# Patient Record
Sex: Female | Born: 1945 | Race: Black or African American | Hispanic: No | Smoking: Former smoker
Health system: Southern US, Community
[De-identification: ages and names within clinical notes are randomized; demographics above are authoritative.]

## PROBLEM LIST (undated history)

## (undated) DIAGNOSIS — D492 Neoplasm of unspecified behavior of bone, soft tissue, and skin: Secondary | ICD-10-CM

## (undated) DIAGNOSIS — N183 Chronic kidney disease, stage 3 (moderate): Secondary | ICD-10-CM

## (undated) DIAGNOSIS — G4733 Obstructive sleep apnea (adult) (pediatric): Secondary | ICD-10-CM

## (undated) DIAGNOSIS — I77819 Aortic ectasia, unspecified site: Secondary | ICD-10-CM

## (undated) DIAGNOSIS — G709 Myoneural disorder, unspecified: Secondary | ICD-10-CM

## (undated) DIAGNOSIS — E785 Hyperlipidemia, unspecified: Secondary | ICD-10-CM

## (undated) DIAGNOSIS — I5032 Chronic diastolic (congestive) heart failure: Secondary | ICD-10-CM

## (undated) DIAGNOSIS — I2699 Other pulmonary embolism without acute cor pulmonale: Secondary | ICD-10-CM

## (undated) DIAGNOSIS — K219 Gastro-esophageal reflux disease without esophagitis: Secondary | ICD-10-CM

## (undated) DIAGNOSIS — N182 Chronic kidney disease, stage 2 (mild): Secondary | ICD-10-CM

## (undated) DIAGNOSIS — I10 Essential (primary) hypertension: Secondary | ICD-10-CM

## (undated) DIAGNOSIS — I749 Embolism and thrombosis of unspecified artery: Secondary | ICD-10-CM

## (undated) HISTORY — DX: Other pulmonary embolism without acute cor pulmonale: I26.99

## (undated) HISTORY — DX: Chronic kidney disease, stage 2 (mild): N18.2

## (undated) HISTORY — DX: Chronic diastolic (congestive) heart failure: I50.32

## (undated) HISTORY — PX: CERVICAL FUSION: SHX112

## (undated) HISTORY — DX: Chronic kidney disease, stage 3 (moderate): N18.3

## (undated) HISTORY — DX: Obstructive sleep apnea (adult) (pediatric): G47.33

## (undated) HISTORY — DX: Aortic ectasia, unspecified site: I77.819

## (undated) HISTORY — DX: Embolism and thrombosis of unspecified artery: I74.9

## (undated) HISTORY — DX: Hyperlipidemia, unspecified: E78.5

---

## 2001-12-09 ENCOUNTER — Ambulatory Visit (HOSPITAL_COMMUNITY): Admission: RE | Admit: 2001-12-09 | Discharge: 2001-12-09 | Payer: Self-pay | Admitting: Family Medicine

## 2001-12-09 ENCOUNTER — Encounter: Payer: Self-pay | Admitting: Family Medicine

## 2001-12-31 ENCOUNTER — Ambulatory Visit: Admission: RE | Admit: 2001-12-31 | Discharge: 2001-12-31 | Payer: Self-pay | Admitting: Family Medicine

## 2002-01-29 ENCOUNTER — Encounter: Payer: Self-pay | Admitting: Family Medicine

## 2002-01-29 ENCOUNTER — Encounter: Admission: RE | Admit: 2002-01-29 | Discharge: 2002-01-29 | Payer: Self-pay | Admitting: Family Medicine

## 2002-02-17 ENCOUNTER — Ambulatory Visit (HOSPITAL_COMMUNITY): Admission: RE | Admit: 2002-02-17 | Discharge: 2002-02-17 | Payer: Self-pay | Admitting: Cardiology

## 2002-06-30 ENCOUNTER — Ambulatory Visit (HOSPITAL_COMMUNITY): Admission: RE | Admit: 2002-06-30 | Discharge: 2002-06-30 | Payer: Self-pay | Admitting: Family Medicine

## 2002-06-30 ENCOUNTER — Encounter: Payer: Self-pay | Admitting: Family Medicine

## 2002-07-02 DIAGNOSIS — I749 Embolism and thrombosis of unspecified artery: Secondary | ICD-10-CM

## 2002-07-02 HISTORY — DX: Embolism and thrombosis of unspecified artery: I74.9

## 2002-12-06 ENCOUNTER — Inpatient Hospital Stay (HOSPITAL_COMMUNITY): Admission: EM | Admit: 2002-12-06 | Discharge: 2002-12-11 | Payer: Self-pay | Admitting: Internal Medicine

## 2002-12-06 ENCOUNTER — Encounter: Payer: Self-pay | Admitting: Internal Medicine

## 2002-12-07 ENCOUNTER — Encounter: Payer: Self-pay | Admitting: General Surgery

## 2003-02-02 ENCOUNTER — Encounter: Payer: Self-pay | Admitting: Neurology

## 2003-02-02 ENCOUNTER — Ambulatory Visit (HOSPITAL_COMMUNITY): Admission: RE | Admit: 2003-02-02 | Discharge: 2003-02-02 | Payer: Self-pay | Admitting: Neurology

## 2003-05-19 ENCOUNTER — Ambulatory Visit (HOSPITAL_COMMUNITY): Admission: RE | Admit: 2003-05-19 | Discharge: 2003-05-19 | Payer: Self-pay | Admitting: Family Medicine

## 2003-05-21 ENCOUNTER — Ambulatory Visit (HOSPITAL_COMMUNITY): Admission: RE | Admit: 2003-05-21 | Discharge: 2003-05-21 | Payer: Self-pay | Admitting: Family Medicine

## 2003-06-17 ENCOUNTER — Other Ambulatory Visit: Admission: RE | Admit: 2003-06-17 | Discharge: 2003-06-17 | Payer: Self-pay | Admitting: Obstetrics and Gynecology

## 2003-08-02 ENCOUNTER — Emergency Department (HOSPITAL_COMMUNITY): Admission: EM | Admit: 2003-08-02 | Discharge: 2003-08-02 | Payer: Self-pay | Admitting: Internal Medicine

## 2003-12-07 ENCOUNTER — Ambulatory Visit (HOSPITAL_COMMUNITY): Admission: RE | Admit: 2003-12-07 | Discharge: 2003-12-07 | Payer: Self-pay | Admitting: Family Medicine

## 2004-04-04 ENCOUNTER — Ambulatory Visit: Payer: Self-pay | Admitting: Cardiology

## 2004-04-19 ENCOUNTER — Ambulatory Visit (HOSPITAL_COMMUNITY): Admission: RE | Admit: 2004-04-19 | Discharge: 2004-04-19 | Payer: Self-pay | Admitting: Family Medicine

## 2004-04-21 ENCOUNTER — Ambulatory Visit: Payer: Self-pay | Admitting: Cardiology

## 2004-07-11 ENCOUNTER — Ambulatory Visit: Payer: Self-pay | Admitting: *Deleted

## 2004-07-18 ENCOUNTER — Ambulatory Visit: Payer: Self-pay | Admitting: Family Medicine

## 2004-07-18 ENCOUNTER — Ambulatory Visit: Payer: Self-pay | Admitting: *Deleted

## 2004-07-25 ENCOUNTER — Ambulatory Visit: Payer: Self-pay | Admitting: *Deleted

## 2004-08-01 ENCOUNTER — Ambulatory Visit: Payer: Self-pay | Admitting: *Deleted

## 2004-10-03 ENCOUNTER — Ambulatory Visit: Payer: Self-pay | Admitting: *Deleted

## 2004-10-11 ENCOUNTER — Ambulatory Visit: Payer: Self-pay | Admitting: Family Medicine

## 2004-10-27 ENCOUNTER — Ambulatory Visit (HOSPITAL_COMMUNITY): Admission: RE | Admit: 2004-10-27 | Discharge: 2004-10-27 | Payer: Self-pay | Admitting: Family Medicine

## 2005-01-03 ENCOUNTER — Ambulatory Visit: Payer: Self-pay | Admitting: Family Medicine

## 2005-01-03 ENCOUNTER — Ambulatory Visit: Payer: Self-pay | Admitting: Cardiology

## 2005-03-07 ENCOUNTER — Ambulatory Visit: Payer: Self-pay | Admitting: *Deleted

## 2005-03-07 ENCOUNTER — Ambulatory Visit: Payer: Self-pay | Admitting: Family Medicine

## 2005-04-03 ENCOUNTER — Ambulatory Visit: Payer: Self-pay | Admitting: Cardiology

## 2005-05-15 ENCOUNTER — Ambulatory Visit: Payer: Self-pay | Admitting: *Deleted

## 2005-06-15 ENCOUNTER — Ambulatory Visit: Payer: Self-pay | Admitting: *Deleted

## 2005-07-26 ENCOUNTER — Ambulatory Visit: Payer: Self-pay | Admitting: Cardiology

## 2005-09-06 ENCOUNTER — Ambulatory Visit: Payer: Self-pay | Admitting: Family Medicine

## 2005-09-06 ENCOUNTER — Ambulatory Visit: Payer: Self-pay | Admitting: Cardiology

## 2005-09-13 ENCOUNTER — Ambulatory Visit (HOSPITAL_COMMUNITY): Admission: RE | Admit: 2005-09-13 | Discharge: 2005-09-13 | Payer: Self-pay | Admitting: Family Medicine

## 2005-10-24 ENCOUNTER — Ambulatory Visit: Payer: Self-pay | Admitting: *Deleted

## 2005-11-06 ENCOUNTER — Ambulatory Visit: Payer: Self-pay | Admitting: Cardiology

## 2005-11-06 ENCOUNTER — Ambulatory Visit: Payer: Self-pay | Admitting: Family Medicine

## 2005-12-24 ENCOUNTER — Ambulatory Visit: Payer: Self-pay | Admitting: *Deleted

## 2006-03-01 ENCOUNTER — Ambulatory Visit: Payer: Self-pay | Admitting: *Deleted

## 2006-04-30 ENCOUNTER — Ambulatory Visit: Payer: Self-pay | Admitting: Family Medicine

## 2006-05-07 ENCOUNTER — Ambulatory Visit (HOSPITAL_COMMUNITY): Admission: RE | Admit: 2006-05-07 | Discharge: 2006-05-07 | Payer: Self-pay | Admitting: Family Medicine

## 2006-05-30 ENCOUNTER — Ambulatory Visit: Payer: Self-pay | Admitting: Cardiology

## 2006-07-23 ENCOUNTER — Ambulatory Visit: Payer: Self-pay | Admitting: Cardiovascular Disease

## 2006-09-04 ENCOUNTER — Ambulatory Visit: Payer: Self-pay | Admitting: Family Medicine

## 2006-10-02 ENCOUNTER — Ambulatory Visit: Payer: Self-pay | Admitting: *Deleted

## 2006-10-16 ENCOUNTER — Ambulatory Visit: Payer: Self-pay | Admitting: Cardiology

## 2006-11-20 ENCOUNTER — Ambulatory Visit: Payer: Self-pay | Admitting: Cardiovascular Disease

## 2006-12-09 ENCOUNTER — Emergency Department (HOSPITAL_COMMUNITY): Admission: EM | Admit: 2006-12-09 | Discharge: 2006-12-09 | Payer: Self-pay | Admitting: Emergency Medicine

## 2006-12-25 ENCOUNTER — Ambulatory Visit: Payer: Self-pay | Admitting: Cardiovascular Disease

## 2007-02-07 ENCOUNTER — Ambulatory Visit: Payer: Self-pay | Admitting: Internal Medicine

## 2007-03-12 ENCOUNTER — Ambulatory Visit: Payer: Self-pay | Admitting: Family Medicine

## 2007-03-12 ENCOUNTER — Ambulatory Visit: Payer: Self-pay | Admitting: Cardiovascular Disease

## 2007-04-10 ENCOUNTER — Ambulatory Visit: Payer: Self-pay | Admitting: Family Medicine

## 2007-04-10 LAB — CONVERTED CEMR LAB
Amylase: 64 units/L (ref 0–105)
BUN: 14 mg/dL (ref 6–23)
Cholesterol: 152 mg/dL (ref 0–200)
Creatinine, Ser: 0.86 mg/dL (ref 0.40–1.20)
Eosinophils Relative: 3 % (ref 0–5)
Hemoglobin: 12.9 g/dL (ref 12.0–15.0)
Lipase: 30 units/L (ref 0–75)
Lymphs Abs: 2.5 10*3/uL (ref 0.7–3.3)
Monocytes Absolute: 0.6 10*3/uL (ref 0.2–0.7)
Neutrophils Relative %: 43 % (ref 43–77)
Platelets: 313 10*3/uL (ref 150–400)
Potassium: 3.9 meq/L (ref 3.5–5.3)
Total CHOL/HDL Ratio: 4.1
Triglycerides: 101 mg/dL (ref <150)
VLDL: 20 mg/dL (ref 0–40)

## 2007-04-21 ENCOUNTER — Ambulatory Visit (HOSPITAL_COMMUNITY): Admission: RE | Admit: 2007-04-21 | Discharge: 2007-04-21 | Payer: Self-pay | Admitting: Family Medicine

## 2007-06-09 ENCOUNTER — Ambulatory Visit: Payer: Self-pay | Admitting: Family Medicine

## 2007-06-28 ENCOUNTER — Emergency Department (HOSPITAL_COMMUNITY): Admission: EM | Admit: 2007-06-28 | Discharge: 2007-06-28 | Payer: Self-pay | Admitting: Emergency Medicine

## 2007-07-11 ENCOUNTER — Ambulatory Visit: Payer: Self-pay | Admitting: Cardiology

## 2007-07-31 ENCOUNTER — Ambulatory Visit: Payer: Self-pay | Admitting: Cardiology

## 2007-09-02 ENCOUNTER — Ambulatory Visit: Payer: Self-pay | Admitting: Cardiology

## 2007-10-14 ENCOUNTER — Ambulatory Visit: Payer: Self-pay | Admitting: Cardiology

## 2007-11-14 ENCOUNTER — Ambulatory Visit: Payer: Self-pay | Admitting: Family Medicine

## 2007-11-18 ENCOUNTER — Ambulatory Visit: Payer: Self-pay | Admitting: Cardiology

## 2007-12-23 ENCOUNTER — Emergency Department (HOSPITAL_COMMUNITY): Admission: EM | Admit: 2007-12-23 | Discharge: 2007-12-23 | Payer: Self-pay | Admitting: Emergency Medicine

## 2008-01-13 ENCOUNTER — Ambulatory Visit: Payer: Self-pay | Admitting: Cardiology

## 2008-01-13 DIAGNOSIS — E669 Obesity, unspecified: Secondary | ICD-10-CM

## 2008-01-13 DIAGNOSIS — E785 Hyperlipidemia, unspecified: Secondary | ICD-10-CM | POA: Insufficient documentation

## 2008-01-13 DIAGNOSIS — I10 Essential (primary) hypertension: Secondary | ICD-10-CM | POA: Insufficient documentation

## 2008-01-13 DIAGNOSIS — K219 Gastro-esophageal reflux disease without esophagitis: Secondary | ICD-10-CM | POA: Insufficient documentation

## 2008-01-13 DIAGNOSIS — D219 Benign neoplasm of connective and other soft tissue, unspecified: Secondary | ICD-10-CM | POA: Insufficient documentation

## 2008-01-13 DIAGNOSIS — M79609 Pain in unspecified limb: Secondary | ICD-10-CM | POA: Insufficient documentation

## 2008-01-21 ENCOUNTER — Ambulatory Visit: Payer: Self-pay | Admitting: Family Medicine

## 2008-02-04 ENCOUNTER — Ambulatory Visit: Payer: Self-pay | Admitting: Cardiology

## 2008-02-06 ENCOUNTER — Encounter: Payer: Self-pay | Admitting: Family Medicine

## 2008-02-18 ENCOUNTER — Ambulatory Visit: Payer: Self-pay | Admitting: Cardiology

## 2008-04-01 ENCOUNTER — Ambulatory Visit: Payer: Self-pay | Admitting: Cardiology

## 2008-04-12 DIAGNOSIS — G47419 Narcolepsy without cataplexy: Secondary | ICD-10-CM | POA: Insufficient documentation

## 2008-04-12 DIAGNOSIS — R5381 Other malaise: Secondary | ICD-10-CM

## 2008-04-12 DIAGNOSIS — I2699 Other pulmonary embolism without acute cor pulmonale: Secondary | ICD-10-CM

## 2008-04-12 DIAGNOSIS — D500 Iron deficiency anemia secondary to blood loss (chronic): Secondary | ICD-10-CM

## 2008-04-12 DIAGNOSIS — R5383 Other fatigue: Secondary | ICD-10-CM

## 2008-04-22 ENCOUNTER — Observation Stay (HOSPITAL_COMMUNITY): Admission: EM | Admit: 2008-04-22 | Discharge: 2008-04-23 | Payer: Self-pay | Admitting: Emergency Medicine

## 2008-04-24 ENCOUNTER — Telehealth (INDEPENDENT_AMBULATORY_CARE_PROVIDER_SITE_OTHER): Payer: Self-pay | Admitting: Internal Medicine

## 2008-04-26 ENCOUNTER — Telehealth: Payer: Self-pay | Admitting: Family Medicine

## 2008-04-26 ENCOUNTER — Ambulatory Visit: Payer: Self-pay | Admitting: Cardiology

## 2008-04-29 ENCOUNTER — Ambulatory Visit: Payer: Self-pay | Admitting: Cardiology

## 2008-04-29 ENCOUNTER — Ambulatory Visit: Payer: Self-pay | Admitting: Family Medicine

## 2008-05-02 DIAGNOSIS — G459 Transient cerebral ischemic attack, unspecified: Secondary | ICD-10-CM | POA: Insufficient documentation

## 2008-05-06 ENCOUNTER — Ambulatory Visit (HOSPITAL_COMMUNITY): Admission: RE | Admit: 2008-05-06 | Discharge: 2008-05-06 | Payer: Self-pay | Admitting: Family Medicine

## 2008-05-17 ENCOUNTER — Encounter: Payer: Self-pay | Admitting: Family Medicine

## 2008-05-31 ENCOUNTER — Ambulatory Visit: Payer: Self-pay | Admitting: Cardiology

## 2008-05-31 ENCOUNTER — Telehealth: Payer: Self-pay | Admitting: Family Medicine

## 2008-06-01 ENCOUNTER — Encounter: Payer: Self-pay | Admitting: Family Medicine

## 2008-06-10 ENCOUNTER — Ambulatory Visit: Payer: Self-pay | Admitting: Cardiology

## 2008-06-17 ENCOUNTER — Ambulatory Visit: Payer: Self-pay | Admitting: Cardiology

## 2008-08-12 ENCOUNTER — Ambulatory Visit: Payer: Self-pay | Admitting: Cardiology

## 2008-08-18 ENCOUNTER — Telehealth: Payer: Self-pay | Admitting: Family Medicine

## 2008-08-26 ENCOUNTER — Ambulatory Visit: Payer: Self-pay | Admitting: Family Medicine

## 2008-08-26 DIAGNOSIS — I739 Peripheral vascular disease, unspecified: Secondary | ICD-10-CM

## 2008-08-26 DIAGNOSIS — F329 Major depressive disorder, single episode, unspecified: Secondary | ICD-10-CM

## 2008-08-26 DIAGNOSIS — R51 Headache: Secondary | ICD-10-CM

## 2008-08-26 DIAGNOSIS — R519 Headache, unspecified: Secondary | ICD-10-CM | POA: Insufficient documentation

## 2008-08-31 ENCOUNTER — Encounter (INDEPENDENT_AMBULATORY_CARE_PROVIDER_SITE_OTHER): Payer: Self-pay | Admitting: *Deleted

## 2008-08-31 ENCOUNTER — Ambulatory Visit (HOSPITAL_COMMUNITY): Admission: RE | Admit: 2008-08-31 | Discharge: 2008-08-31 | Payer: Self-pay | Admitting: Family Medicine

## 2008-08-31 LAB — CONVERTED CEMR LAB
BUN: 12 mg/dL
CO2: 21 meq/L
Chloride: 103 meq/L
Glucose, Bld: 134 mg/dL
Potassium: 3.4 meq/L

## 2008-09-01 ENCOUNTER — Encounter: Payer: Self-pay | Admitting: Family Medicine

## 2008-09-01 LAB — CONVERTED CEMR LAB
Band Neutrophils: 0 % (ref 0–10)
Calcium: 9.3 mg/dL (ref 8.4–10.5)
Chloride: 103 meq/L (ref 96–112)
Eosinophils Absolute: 0.2 10*3/uL (ref 0.0–0.7)
Eosinophils Relative: 2 % (ref 0–5)
HCT: 37.1 % (ref 36.0–46.0)
Hemoglobin: 13 g/dL (ref 12.0–15.0)
LDL Cholesterol: 113 mg/dL — ABNORMAL HIGH (ref 0–99)
MCHC: 35 g/dL (ref 30.0–36.0)
Neutrophils Relative %: 51 % (ref 43–77)
Potassium: 3.4 meq/L — ABNORMAL LOW (ref 3.5–5.3)
RBC: 4.63 M/uL (ref 3.87–5.11)
Sodium: 140 meq/L (ref 135–145)
VLDL: 28 mg/dL (ref 0–40)
Vitamin B-12: 487 pg/mL (ref 211–911)

## 2008-09-02 DIAGNOSIS — E739 Lactose intolerance, unspecified: Secondary | ICD-10-CM

## 2008-09-03 ENCOUNTER — Telehealth: Payer: Self-pay | Admitting: Family Medicine

## 2008-09-07 ENCOUNTER — Telehealth: Payer: Self-pay | Admitting: Family Medicine

## 2008-09-17 ENCOUNTER — Telehealth: Payer: Self-pay | Admitting: Family Medicine

## 2008-10-21 ENCOUNTER — Ambulatory Visit: Payer: Self-pay | Admitting: Cardiology

## 2008-10-28 ENCOUNTER — Ambulatory Visit: Payer: Self-pay | Admitting: Cardiology

## 2008-10-28 ENCOUNTER — Ambulatory Visit: Payer: Self-pay | Admitting: Family Medicine

## 2008-11-02 ENCOUNTER — Telehealth: Payer: Self-pay | Admitting: Family Medicine

## 2008-11-25 ENCOUNTER — Ambulatory Visit: Payer: Self-pay | Admitting: Cardiology

## 2008-12-10 ENCOUNTER — Encounter: Payer: Self-pay | Admitting: Family Medicine

## 2008-12-16 ENCOUNTER — Ambulatory Visit: Payer: Self-pay | Admitting: Cardiology

## 2008-12-24 ENCOUNTER — Emergency Department (HOSPITAL_COMMUNITY): Admission: EM | Admit: 2008-12-24 | Discharge: 2008-12-25 | Payer: Self-pay | Admitting: Emergency Medicine

## 2008-12-30 ENCOUNTER — Telehealth: Payer: Self-pay | Admitting: Family Medicine

## 2008-12-31 ENCOUNTER — Encounter: Payer: Self-pay | Admitting: Family Medicine

## 2009-01-06 ENCOUNTER — Ambulatory Visit: Payer: Self-pay | Admitting: Cardiology

## 2009-02-11 ENCOUNTER — Encounter: Payer: Self-pay | Admitting: Cardiology

## 2009-02-14 ENCOUNTER — Ambulatory Visit: Payer: Self-pay | Admitting: Cardiology

## 2009-02-14 ENCOUNTER — Encounter: Payer: Self-pay | Admitting: *Deleted

## 2009-02-14 LAB — CONVERTED CEMR LAB: POC INR: 4.2

## 2009-02-28 ENCOUNTER — Encounter: Payer: Self-pay | Admitting: Family Medicine

## 2009-03-02 ENCOUNTER — Ambulatory Visit: Payer: Self-pay

## 2009-03-02 LAB — CONVERTED CEMR LAB: POC INR: 1.8

## 2009-04-01 ENCOUNTER — Encounter (INDEPENDENT_AMBULATORY_CARE_PROVIDER_SITE_OTHER): Payer: Self-pay | Admitting: Cardiology

## 2009-04-13 ENCOUNTER — Encounter: Payer: Self-pay | Admitting: Cardiology

## 2009-04-13 ENCOUNTER — Ambulatory Visit: Payer: Self-pay | Admitting: Cardiology

## 2009-04-13 LAB — CONVERTED CEMR LAB: POC INR: 3.6

## 2009-05-17 ENCOUNTER — Ambulatory Visit: Payer: Self-pay | Admitting: Family Medicine

## 2009-05-17 DIAGNOSIS — G4730 Sleep apnea, unspecified: Secondary | ICD-10-CM | POA: Insufficient documentation

## 2009-05-19 ENCOUNTER — Encounter (INDEPENDENT_AMBULATORY_CARE_PROVIDER_SITE_OTHER): Payer: Self-pay | Admitting: Cardiology

## 2009-05-23 ENCOUNTER — Encounter: Payer: Self-pay | Admitting: Family Medicine

## 2009-05-27 DIAGNOSIS — D492 Neoplasm of unspecified behavior of bone, soft tissue, and skin: Secondary | ICD-10-CM

## 2009-05-27 DIAGNOSIS — R0602 Shortness of breath: Secondary | ICD-10-CM

## 2009-05-31 ENCOUNTER — Encounter: Admission: RE | Admit: 2009-05-31 | Discharge: 2009-05-31 | Payer: Self-pay | Admitting: Family Medicine

## 2009-06-01 ENCOUNTER — Ambulatory Visit: Payer: Self-pay | Admitting: Cardiology

## 2009-06-21 ENCOUNTER — Encounter (INDEPENDENT_AMBULATORY_CARE_PROVIDER_SITE_OTHER): Payer: Self-pay | Admitting: *Deleted

## 2009-06-21 ENCOUNTER — Ambulatory Visit: Payer: Self-pay | Admitting: Family Medicine

## 2009-06-23 ENCOUNTER — Ambulatory Visit: Payer: Self-pay | Admitting: Cardiology

## 2009-07-20 ENCOUNTER — Encounter (INDEPENDENT_AMBULATORY_CARE_PROVIDER_SITE_OTHER): Payer: Self-pay | Admitting: Cardiology

## 2009-08-24 ENCOUNTER — Ambulatory Visit: Payer: Self-pay | Admitting: Cardiology

## 2009-09-20 ENCOUNTER — Telehealth: Payer: Self-pay | Admitting: Family Medicine

## 2009-09-22 ENCOUNTER — Ambulatory Visit: Payer: Self-pay | Admitting: Cardiology

## 2009-10-07 ENCOUNTER — Encounter (INDEPENDENT_AMBULATORY_CARE_PROVIDER_SITE_OTHER): Payer: Self-pay | Admitting: *Deleted

## 2009-10-20 ENCOUNTER — Ambulatory Visit: Payer: Self-pay | Admitting: Cardiology

## 2009-11-06 ENCOUNTER — Emergency Department (HOSPITAL_COMMUNITY): Admission: EM | Admit: 2009-11-06 | Discharge: 2009-11-06 | Payer: Self-pay | Admitting: Emergency Medicine

## 2009-11-14 ENCOUNTER — Telehealth: Payer: Self-pay | Admitting: Family Medicine

## 2009-11-17 ENCOUNTER — Ambulatory Visit: Payer: Self-pay | Admitting: Cardiology

## 2009-11-17 LAB — CONVERTED CEMR LAB: POC INR: 1.4

## 2009-12-04 IMAGING — CT CT HEAD W/O CM
1 series · 16 of 30 positions shown, 20 images · non-contrast
Comparison: 12/09/2006

CLINICAL DATA: Left sided numbness

CT HEAD WITHOUT CONTRAST
TECHNIQUE: Contiguous axial images were obtained from the base of
the skull through the vertex without contrast.

[Series 4: headseq 4.8 h37s · axial · 0.43mm/px · z∈[+97,+255]mm · 16 of 36 slices shown, 20 images]
[im 2/36  brain]
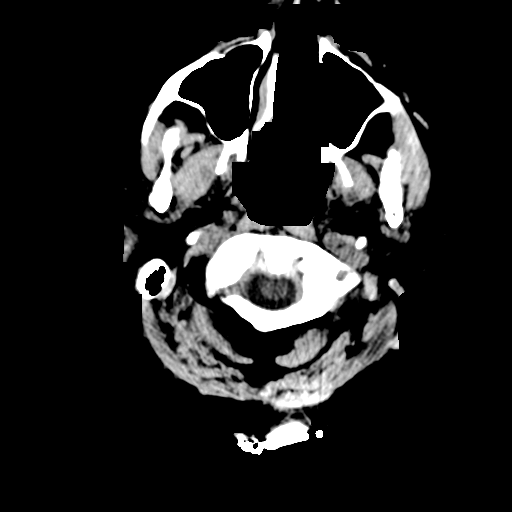
[im 2/36  bone]
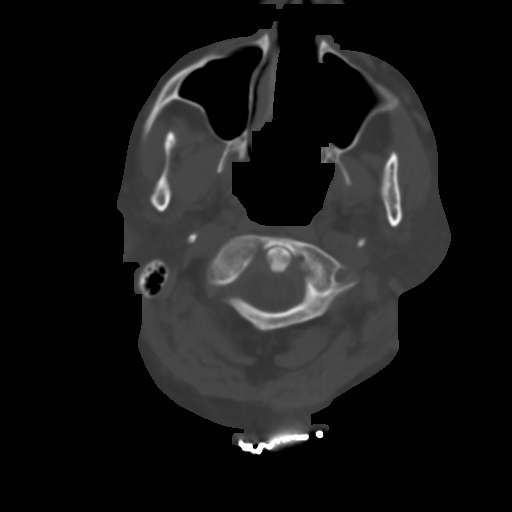
[im 4/36  brain]
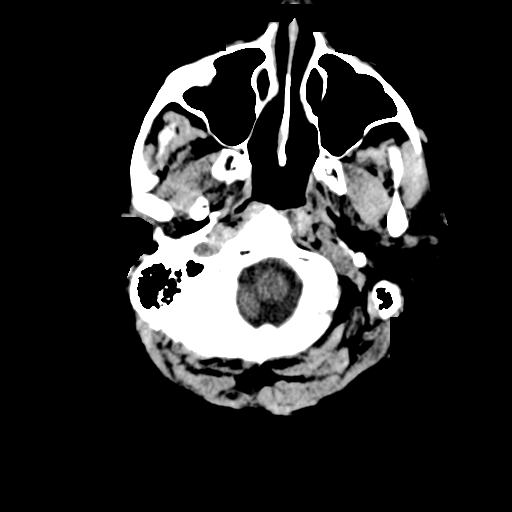
[im 7/36  brain]
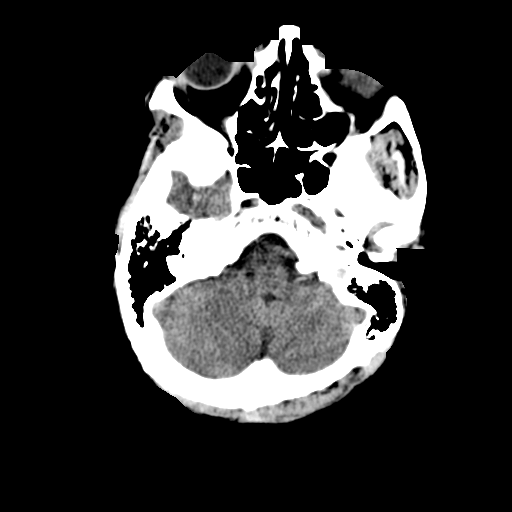
[im 9/36  brain]
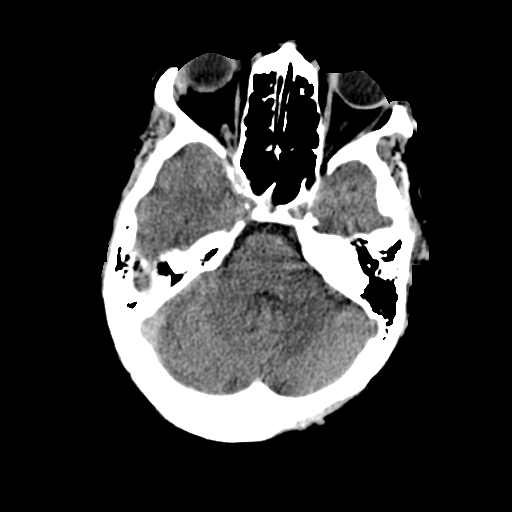
[im 10/36  brain]
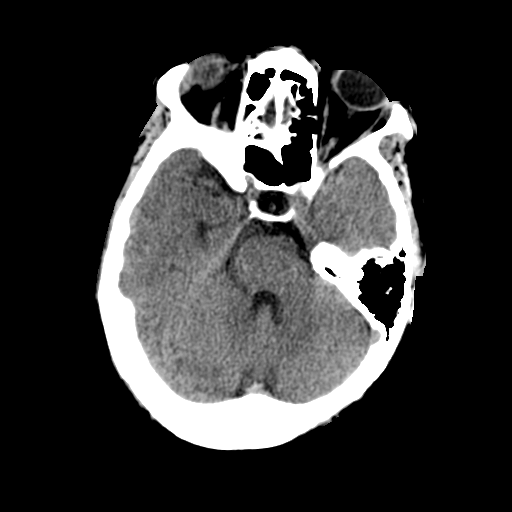
[im 10/36  bone]
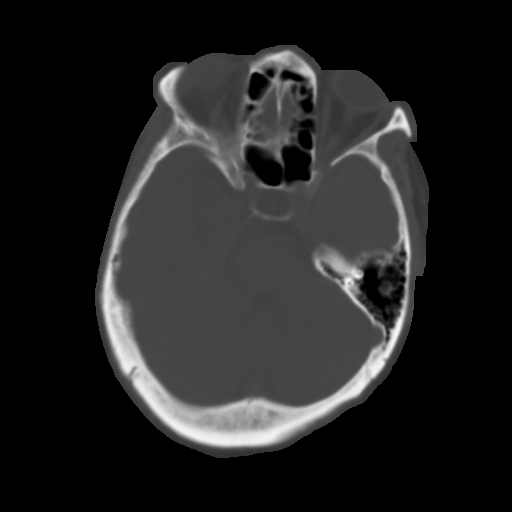
[im 13/36  brain]
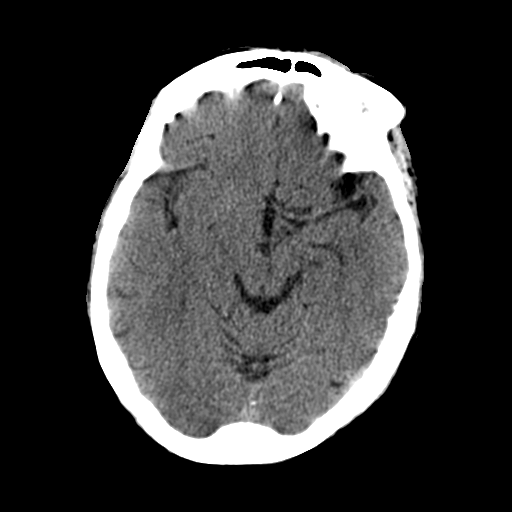
[im 15/36  brain]
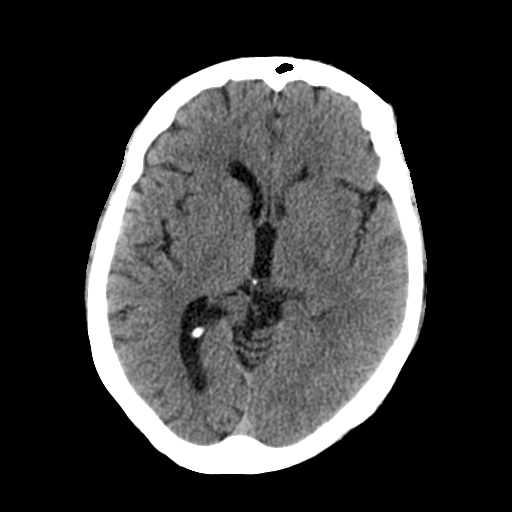
[im 17/36  brain]
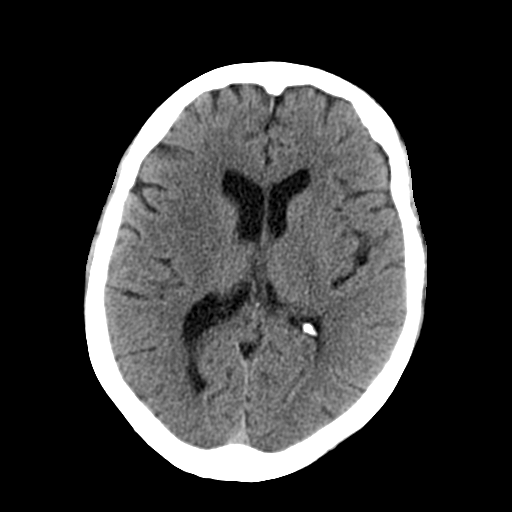
[im 19/36  brain]
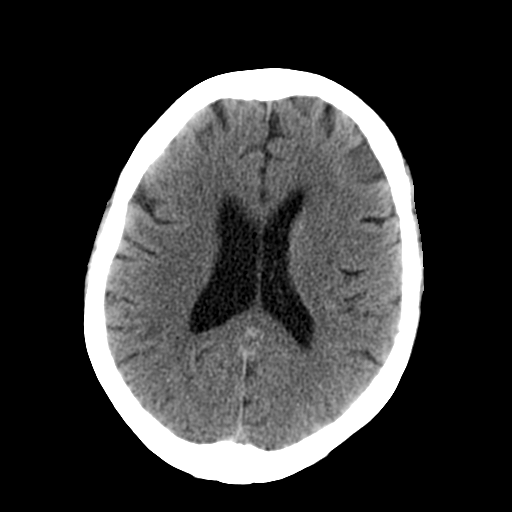
[im 19/36  bone]
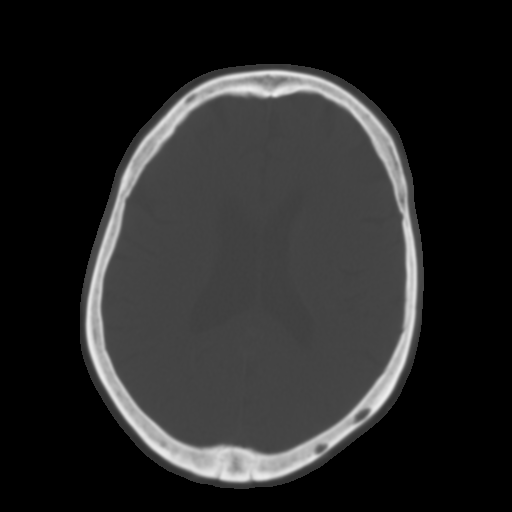
[im 21/36  brain]
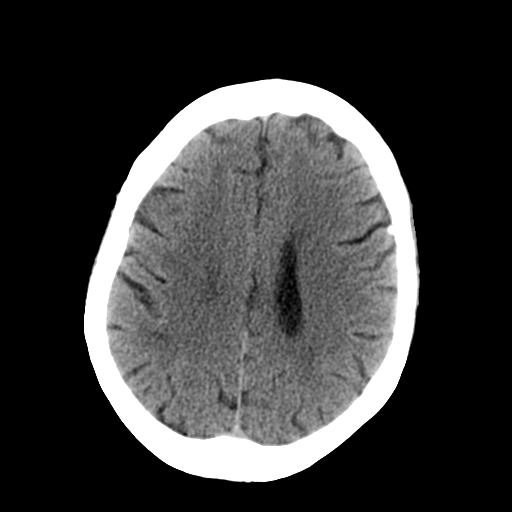
[im 23/36  brain]
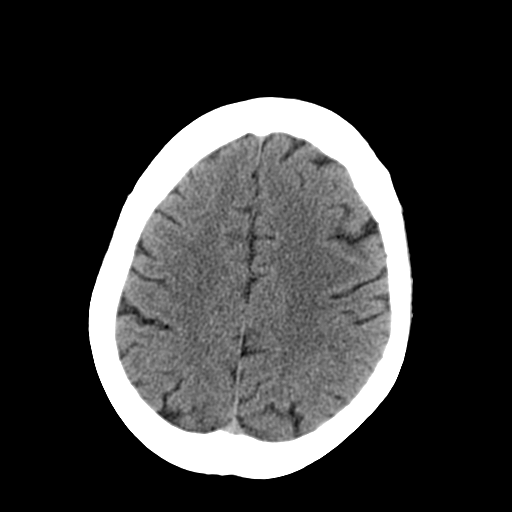
[im 26/36  brain]
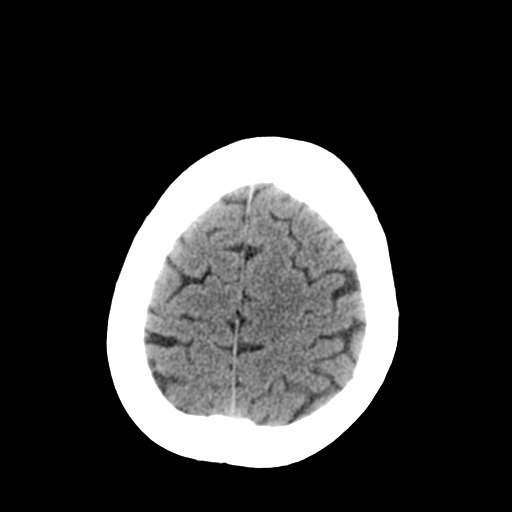
[im 27/36  brain]
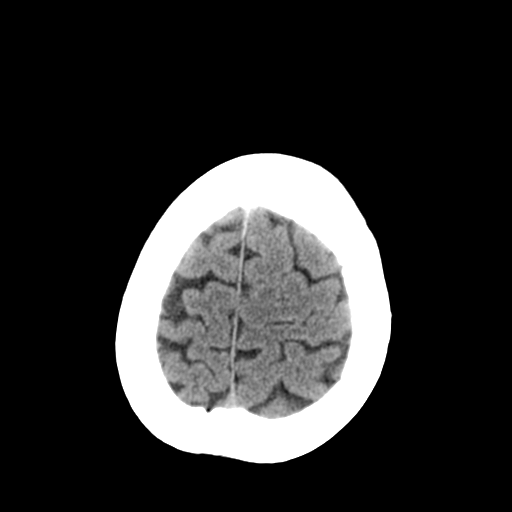
[im 27/36  bone]
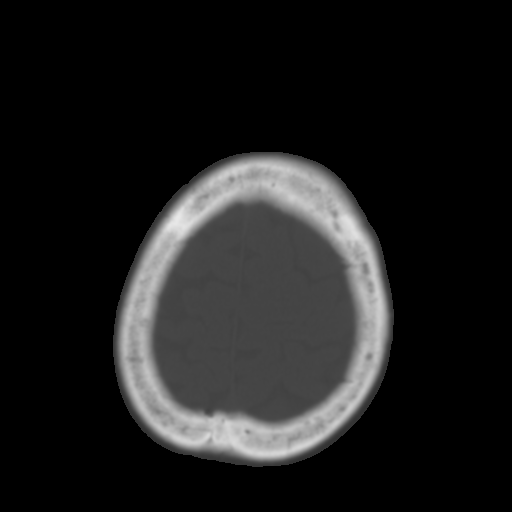
[im 29/36  brain]
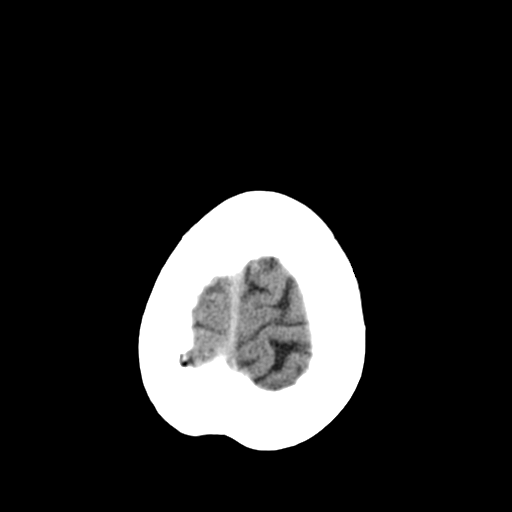
[im 32/36  brain]
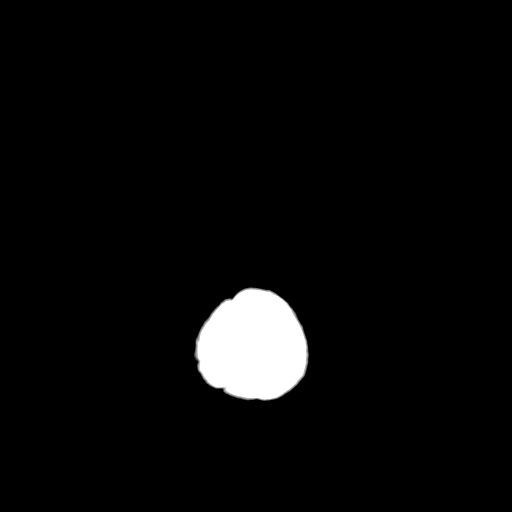
[im 34/36  brain]
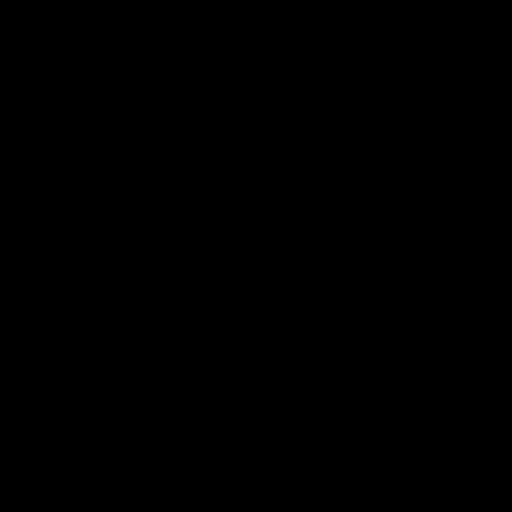

[16 of 30 positions shown; findings below may reference images not displayed]

FINDINGS: The brain shows an old white matter stroke in the right
parietal region.  No sign of acute or subacute infarction, mass
lesion, hemorrhage, hydrocephalus or extra-axial collection.
Benign-appearing lucencies in the calvarium on the left are
unchanged.  Sinuses, middle ears and mastoids are clear.
IMPRESSION: No sign of acute or reversible process.  Old white matter stroke
right parietal lobe.

## 2009-12-07 ENCOUNTER — Ambulatory Visit: Payer: Self-pay | Admitting: Cardiology

## 2009-12-07 LAB — CONVERTED CEMR LAB: POC INR: 2.5

## 2010-01-06 ENCOUNTER — Telehealth: Payer: Self-pay | Admitting: Family Medicine

## 2010-01-09 ENCOUNTER — Encounter (INDEPENDENT_AMBULATORY_CARE_PROVIDER_SITE_OTHER): Payer: Self-pay | Admitting: *Deleted

## 2010-01-10 ENCOUNTER — Ambulatory Visit: Payer: Self-pay | Admitting: Family Medicine

## 2010-01-10 DIAGNOSIS — R7301 Impaired fasting glucose: Secondary | ICD-10-CM

## 2010-01-11 ENCOUNTER — Encounter: Payer: Self-pay | Admitting: Family Medicine

## 2010-01-15 DIAGNOSIS — M542 Cervicalgia: Secondary | ICD-10-CM

## 2010-01-17 LAB — CONVERTED CEMR LAB
Albumin: 4.7 g/dL (ref 3.5–5.2)
Alkaline Phosphatase: 97 units/L (ref 39–117)
BUN: 15 mg/dL (ref 6–23)
Basophils Absolute: 0 10*3/uL (ref 0.0–0.1)
CO2: 22 meq/L (ref 19–32)
Chloride: 102 meq/L (ref 96–112)
Creatinine, Ser: 1.08 mg/dL (ref 0.40–1.20)
HDL: 46 mg/dL (ref >39)
Hemoglobin: 15.1 g/dL — ABNORMAL HIGH (ref 12.0–15.0)
Hgb A1c MFr Bld: 6.7 % — ABNORMAL HIGH (ref <5.7)
LDL Cholesterol: 133 mg/dL — ABNORMAL HIGH (ref 0–99)
Lymphocytes Relative: 38 % (ref 12–46)
Monocytes Absolute: 0.4 10*3/uL (ref 0.1–1.0)
Monocytes Relative: 7 % (ref 3–12)
Neutro Abs: 3.1 10*3/uL (ref 1.7–7.7)
Potassium: 4.2 meq/L (ref 3.5–5.3)
RBC: 5.41 M/uL — ABNORMAL HIGH (ref 3.87–5.11)
Total Bilirubin: 0.3 mg/dL (ref 0.3–1.2)
VLDL: 32 mg/dL (ref 0–40)

## 2010-01-19 ENCOUNTER — Ambulatory Visit (HOSPITAL_COMMUNITY): Admission: RE | Admit: 2010-01-19 | Discharge: 2010-01-19 | Payer: Self-pay | Admitting: Family Medicine

## 2010-01-23 ENCOUNTER — Telehealth: Payer: Self-pay | Admitting: Family Medicine

## 2010-01-26 ENCOUNTER — Ambulatory Visit: Payer: Self-pay | Admitting: Cardiology

## 2010-01-26 LAB — CONVERTED CEMR LAB: POC INR: 2.3

## 2010-02-14 ENCOUNTER — Encounter: Payer: Self-pay | Admitting: Family Medicine

## 2010-02-14 ENCOUNTER — Telehealth: Payer: Self-pay | Admitting: Family Medicine

## 2010-02-22 ENCOUNTER — Emergency Department (HOSPITAL_COMMUNITY): Admission: EM | Admit: 2010-02-22 | Discharge: 2010-02-22 | Payer: Self-pay | Admitting: Emergency Medicine

## 2010-02-23 ENCOUNTER — Ambulatory Visit: Payer: Self-pay | Admitting: Cardiology

## 2010-02-23 LAB — CONVERTED CEMR LAB: POC INR: 2.3

## 2010-02-27 ENCOUNTER — Telehealth: Payer: Self-pay | Admitting: Family Medicine

## 2010-03-14 ENCOUNTER — Ambulatory Visit: Payer: Self-pay | Admitting: Family Medicine

## 2010-03-14 DIAGNOSIS — E1121 Type 2 diabetes mellitus with diabetic nephropathy: Secondary | ICD-10-CM | POA: Insufficient documentation

## 2010-03-23 ENCOUNTER — Encounter (INDEPENDENT_AMBULATORY_CARE_PROVIDER_SITE_OTHER): Payer: Self-pay | Admitting: *Deleted

## 2010-04-06 ENCOUNTER — Ambulatory Visit: Payer: Self-pay | Admitting: Cardiology

## 2010-05-04 ENCOUNTER — Ambulatory Visit: Payer: Self-pay | Admitting: Cardiology

## 2010-06-01 ENCOUNTER — Ambulatory Visit: Payer: Self-pay | Admitting: Cardiology

## 2010-06-01 LAB — CONVERTED CEMR LAB: POC INR: 2.2

## 2010-06-29 ENCOUNTER — Ambulatory Visit: Payer: Self-pay | Admitting: Cardiology

## 2010-07-19 ENCOUNTER — Ambulatory Visit: Admit: 2010-07-19 | Payer: Self-pay

## 2010-07-19 ENCOUNTER — Ambulatory Visit
Admission: RE | Admit: 2010-07-19 | Discharge: 2010-07-19 | Payer: Self-pay | Source: Home / Self Care | Attending: Family Medicine | Admitting: Family Medicine

## 2010-07-19 ENCOUNTER — Encounter (INDEPENDENT_AMBULATORY_CARE_PROVIDER_SITE_OTHER): Payer: Self-pay | Admitting: *Deleted

## 2010-07-19 DIAGNOSIS — R040 Epistaxis: Secondary | ICD-10-CM | POA: Insufficient documentation

## 2010-07-19 DIAGNOSIS — M25512 Pain in left shoulder: Secondary | ICD-10-CM | POA: Insufficient documentation

## 2010-07-20 ENCOUNTER — Encounter: Payer: Self-pay | Admitting: Family Medicine

## 2010-07-20 LAB — CONVERTED CEMR LAB
BUN: 12 mg/dL (ref 6–23)
CO2: 25 meq/L (ref 19–32)
Chloride: 102 meq/L (ref 96–112)
Creatinine, Ser: 0.96 mg/dL (ref 0.40–1.20)
Glucose, Bld: 79 mg/dL (ref 70–99)
Potassium: 3.5 meq/L (ref 3.5–5.3)

## 2010-07-22 ENCOUNTER — Encounter: Payer: Self-pay | Admitting: Family Medicine

## 2010-07-23 ENCOUNTER — Encounter: Payer: Self-pay | Admitting: Family Medicine

## 2010-07-23 ENCOUNTER — Encounter: Payer: Self-pay | Admitting: Neurosurgery

## 2010-07-24 ENCOUNTER — Encounter (INDEPENDENT_AMBULATORY_CARE_PROVIDER_SITE_OTHER): Payer: Self-pay | Admitting: *Deleted

## 2010-07-24 ENCOUNTER — Ambulatory Visit: Admit: 2010-07-24 | Payer: Self-pay | Admitting: Cardiology

## 2010-07-26 ENCOUNTER — Emergency Department (HOSPITAL_COMMUNITY)
Admission: EM | Admit: 2010-07-26 | Discharge: 2010-07-26 | Payer: Self-pay | Source: Home / Self Care | Admitting: Emergency Medicine

## 2010-07-26 LAB — DIFFERENTIAL
Basophils Absolute: 0.1 10*3/uL (ref 0.0–0.1)
Lymphs Abs: 2.9 10*3/uL (ref 0.7–4.0)
Monocytes Absolute: 0.6 10*3/uL (ref 0.1–1.0)
Neutro Abs: 2.1 10*3/uL (ref 1.7–7.7)
Neutrophils Relative %: 36 % — ABNORMAL LOW (ref 43–77)

## 2010-07-26 LAB — CBC
HCT: 36.4 % (ref 36.0–46.0)
Hemoglobin: 13.1 g/dL (ref 12.0–15.0)
RBC: 4.75 MIL/uL (ref 3.87–5.11)

## 2010-07-26 LAB — PROTIME-INR
INR: 3.96 — ABNORMAL HIGH (ref 0.00–1.49)
Prothrombin Time: 38.6 seconds — ABNORMAL HIGH (ref 11.6–15.2)

## 2010-07-31 ENCOUNTER — Ambulatory Visit
Admission: RE | Admit: 2010-07-31 | Discharge: 2010-07-31 | Payer: Self-pay | Source: Home / Self Care | Attending: Cardiology | Admitting: Cardiology

## 2010-07-31 ENCOUNTER — Telehealth: Payer: Self-pay | Admitting: Family Medicine

## 2010-07-31 LAB — CONVERTED CEMR LAB: POC INR: 1.3

## 2010-08-01 NOTE — Progress Notes (Signed)
  Phone Note From Pharmacy   [facility]  [address] [id number] Summary of Call: pa required for singulair  Follow-up for Phone Call        pls let pt knowand change to zyrtec 10mg  one daily erx 30 refill 3 pls Follow-up by: Syliva Overman MD,  September 22, 2009 12:19 PM  Additional Follow-up for Phone Call Additional follow up Details #1::        med changed, called patient, left message Additional Follow-up by: Adella Hare LPN,  September 22, 2009 2:25 PM    Additional Follow-up for Phone Call Additional follow up Details #2::    patient aware Follow-up by: Adella Hare LPN,  September 23, 2009 9:21 AM  New/Updated Medications: ZYRTEC ALLERGY 10 MG TABS (CETIRIZINE HCL) one tab by mouth once daily  discontinue singulair Prescriptions: ZYRTEC ALLERGY 10 MG TABS (CETIRIZINE HCL) one tab by mouth once daily  discontinue singulair  #30 x 3   Entered by:   Adella Hare LPN   Authorized by:   Syliva Overman MD   Signed by:   Adella Hare LPN on 96/10/5407   Method used:   [facility]        [facility]  [address] [id number]       [address]       [place], [place]  [address]       Ph: [phone] or [phone]       Fax: [phone]   RxID:   [id number]

## 2010-08-01 NOTE — Medication Information (Signed)
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 no    Recent/future dental: no  Any missed doses?: no       Is patient compliant with meds? yes       Allergies: 1)  ! Asa  Anticoagulation Management History:      The patient is taking warfarin and comes in today for a routine follow up visit.  Positive risk factors for bleeding include history of CVA/TIA and presence of serious comorbidities.  Negative risk factors for bleeding include an age less than 61 years old.  The bleeding index is 'intermediate risk'.  Positive CHADS2 values include History of HTN, History of Diabetes, and Prior Stroke/CVA/TIA.  Negative CHADS2 values include Age > 61 years old.  The start date was 07/07/2003.  Anticoagulation responsible provider: Daleen Squibb MD, Maisie Fus.  INR POC: 2.2.  Cuvette Lot#: [id number].  Exp: 10/11.    Anticoagulation Management Assessment/Plan:      The patient's current anticoagulation dose is Coumadin 5 mg  tabs: take one tablet daily as directed by coumadin clinic.  The target INR is 2 - 3.  The next INR is due 06/29/2010.  Anticoagulation instructions were given to patient.  Results were reviewed/authorized by Vashti Hey, RN.  She was notified by Vashti Hey RN.         Prior Anticoagulation Instructions: INR 2.1 Continue coumadin 2.5mg  once daily except 5mg  on Tuesdays, Thursdays and Saturdays  Current Anticoagulation Instructions: INR 2.2 Continue coumadin 2.5mg  once daily except 5mg  on Mondays, Thursdays and Saturdays

## 2010-08-01 NOTE — Medication Information (Signed)
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 no    Recent/future dental: no  Any missed doses?: no       Is patient compliant with meds? yes       Allergies: 1)  ! Asa  Anticoagulation Management History:      The patient is taking warfarin and comes in today for a routine follow up visit.  Positive risk factors for bleeding include history of CVA/TIA.  Negative risk factors for bleeding include an age less than 2 years old.  The bleeding index is 'intermediate risk'.  Positive CHADS2 values include History of HTN and Prior Stroke/CVA/TIA.  Negative CHADS2 values include Age > 42 years old.  The start date was 07/07/2003.  Anticoagulation responsible provider: Diona Browner MD, Remi Deter.  INR POC: 1.6.  Cuvette Lot#: [id number].  Exp: 10/11.    Anticoagulation Management Assessment/Plan:      The patient's current anticoagulation dose is Coumadin 5 mg  tabs: take one tablet daily as directed by coumadin clinic.  The target INR is 2 - 3.  The next INR is due 10/06/2009.  Anticoagulation instructions were given to patient.  Results were reviewed/authorized by Vashti Hey, RN.  She was notified by Vashti Hey RN.         Prior Anticoagulation Instructions: INR 2.6 Continue coumadin 2.5mg  once daily except 5mg  on Tuesdays and Thursdays  Current Anticoagulation Instructions: INR 1.6 Take coumadin 1 extra tablet tonight then resume 1/2 tablet once daily except 1 tablet on Tuesdays and Thursdays

## 2010-08-01 NOTE — Letter (Signed)
Summary: Appointment - Missed  [facility]     [place], [place]    Phone:   Fax:      March 23, 2010 MRN: [id number]   [facility] [address] [place], [place]  [address]   Dear Stephanie Singleton,  Our records indicate you missed your appointment on          03/23/10 [facility]               It is very important that we reach you to reschedule this appointment. We look forward to participating in your health care needs. Please contact us at the number listed above at your earliest convenience to reschedule this appointment.     Sincerely,    Glass blower/designer

## 2010-08-01 NOTE — Medication Information (Signed)
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 no    Recent/future dental: no  Any missed doses?: no       Is patient compliant with meds? yes       Allergies: 1)  ! Asa  Anticoagulation Management History:      The patient is taking warfarin and comes in today for a routine follow up visit.  Positive risk factors for bleeding include history of CVA/TIA.  Negative risk factors for bleeding include an age less than 34 years old.  The bleeding index is 'intermediate risk'.  Positive CHADS2 values include History of HTN and Prior Stroke/CVA/TIA.  Negative CHADS2 values include Age > 68 years old.  The start date was 07/07/2003.  Anticoagulation responsible provider: Dietrich Pates MD, Molly Maduro.  INR POC: 1.4.  Cuvette Lot#: [id number].  Exp: 10/11.    Anticoagulation Management Assessment/Plan:      The patient's current anticoagulation dose is Coumadin 5 mg  tabs: take one tablet daily as directed by coumadin clinic.  The target INR is 2 - 3.  The next INR is due 11/30/2009.  Anticoagulation instructions were given to patient.  Results were reviewed/authorized by Vashti Hey, RN.  She was notified by Vashti Hey RN.         Prior Anticoagulation Instructions: INR 2.1 Continue coumadin 2.5mg  once daily except 5mg  on Tuesdays and Thursdays  Current Anticoagulation Instructions: INR 1.4 Take coumadin 1 extra tablet tonight then increase dose to 2.5mg  once daily except 5mg  on Tuesdays, Thursdays and Saturdays

## 2010-08-01 NOTE — Medication Information (Signed)
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 no    Recent/future dental: no  Any missed doses?: no       Is patient compliant with meds? yes       Allergies: 1)  ! Asa  Anticoagulation Management History:      The patient is taking warfarin and comes in today for a routine follow up visit.  Positive risk factors for bleeding include history of CVA/TIA and presence of serious comorbidities.  Negative risk factors for bleeding include an age less than 84 years old.  The bleeding index is 'intermediate risk'.  Positive CHADS2 values include History of HTN, History of Diabetes, and Prior Stroke/CVA/TIA.  Negative CHADS2 values include Age > 79 years old.  The start date was 07/07/2003.  Anticoagulation responsible provider: Dietrich Pates MD, Molly Maduro.  INR POC: 1.9.  Cuvette Lot#: [id number].  Exp: 10/11.    Anticoagulation Management Assessment/Plan:      The patient's current anticoagulation dose is Coumadin 5 mg  tabs: take one tablet daily as directed by coumadin clinic.  The target INR is 2 - 3.  The next INR is due 05/04/2010.  Anticoagulation instructions were given to patient.  Results were reviewed/authorized by Stephanie Hey, RN.  She was notified by Stephanie Hey RN.         Prior Anticoagulation Instructions: INR 2.3 Continue coumadin 2.5mg  once daily except 5mg  on Tuesdays, Thursdays and Saturdays  Current Anticoagulation Instructions: INR 1.9 Take coumadin 5mg  tomorrow night then resume 2.5mg  once daily except 5mg  on Tuesdays, Thursdays and Saturdays

## 2010-08-01 NOTE — Progress Notes (Signed)
  Phone Note From Pharmacy   Caller: Patient Summary of Call: insurance will not pay for omeprazole  Initial call taken by: Adella Hare LPN,  Nov 14, 2009 3:10 PM  Follow-up for Phone Call        pls check with the pharmacy and see what they will pay for basedon her ins and let me know, in the meantime ranitidine 300mg  one daily #30 is $4 at [facility] with no ins, it is not quite the samr med but may help Follow-up by: Syliva Overman MD,  Nov 14, 2009 4:33 PM  Additional Follow-up for Phone Call Additional follow up Details #1::        i believe it was the two times a day, sent 40mg  once daily will see if this goes through first Additional Follow-up by: Adella Hare LPN,  Nov 18, 2009 3:30 PM    New/Updated Medications: OMEPRAZOLE 40 MG CPDR (OMEPRAZOLE) one cap by mouth once daily Prescriptions: OMEPRAZOLE 40 MG CPDR (OMEPRAZOLE) one cap by mouth once daily  #30 x 2   Entered by:   Adella Hare LPN   Authorized by:   Syliva Overman MD   Signed by:   Adella Hare LPN on 78/29/5621   Method used:   [facility]  [address] [facility]       [address]       [place], [place]  [address]       Ph: [phone] or [phone]       Fax: [phone]   RxID:   [id number]

## 2010-08-01 NOTE — Progress Notes (Signed)
Summary: LANCETS  Phone Note Call from Patient   Summary of Call: NEEDS LANCETS FOR ACCUCHECK [facility] [place] Initial call taken by: Lind Guest,  February 27, 2010 2:36 PM  Follow-up for Phone Call        Rx Called In Follow-up by: Adella Hare LPN,  February 27, 2010 2:44 PM    New/Updated Medications: ACCU-CHEK MULTICLIX LANCETS  MISC (LANCETS) once daily testing Prescriptions: ACCU-CHEK MULTICLIX LANCETS  MISC (LANCETS) once daily testing  #100 x 0   Entered by:   Adella Hare LPN   Authorized by:   Syliva Overman MD   Signed by:   Adella Hare LPN on 69/62/9528   Method used:   [facility]        [facility]  [address] [facility]       [address]       [place], [place]  [address]       Ph: [phone] or [phone]       Fax: [phone]   RxID:   [id number]

## 2010-08-01 NOTE — Medication Information (Signed)
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 no    Recent/future dental: no  Any missed doses?: no       Is patient compliant with meds? yes       Allergies: 1)  ! Asa  Anticoagulation Management History:      The patient is taking warfarin and comes in today for a routine follow up visit.  Positive risk factors for bleeding include history of CVA/TIA.  Negative risk factors for bleeding include an age less than 34 years old.  The bleeding index is 'intermediate risk'.  Positive CHADS2 values include History of HTN and Prior Stroke/CVA/TIA.  Negative CHADS2 values include Age > 89 years old.  The start date was 07/07/2003.  Anticoagulation responsible provider: Diona Browner MD, Remi Deter.  INR POC: 2.6.  Cuvette Lot#: [id number].  Exp: 10/11.    Anticoagulation Management Assessment/Plan:      The patient's current anticoagulation dose is Coumadin 5 mg  tabs: take one tablet daily as directed by coumadin clinic.  The target INR is 2 - 3.  The next INR is due 09/22/2009.  Anticoagulation instructions were given to patient.  Results were reviewed/authorized by Vashti Hey, RN.  She was notified by Vashti Hey RN.         Prior Anticoagulation Instructions: INR 1.7 Increase coumadin to 2.5mg  once daily except 5mg  on Tuesdays and Thursdays  Current Anticoagulation Instructions: INR 2.6 Continue coumadin 2.5mg  once daily except 5mg  on Tuesdays and Thursdays

## 2010-08-01 NOTE — Letter (Signed)
Summary: Appointment - Missed  [facility]  [address], [place] [address]   Phone: [phone]  Fax: [phone]     October 07, 2009 MRN: [id number]   [facility] [address] [place], [place]  [address]   Dear Ms. Burget,  Our records indicate you missed your appointment on   10/06/09    [facility]  It is very important that we reach you to reschedule this appointment. We look forward to participating in your health care needs. Please contact us at the number listed above at your earliest convenience to reschedule this appointment.     Sincerely,    Glass blower/designer

## 2010-08-01 NOTE — Medication Information (Signed)
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 no    Recent/future dental: no  Any missed doses?: no       Is patient compliant with meds? yes       Allergies: 1)  ! Asa  Anticoagulation Management History:      The patient is taking warfarin and comes in today for a routine follow up visit.  Positive risk factors for bleeding include history of CVA/TIA.  Negative risk factors for bleeding include an age less than 61 years old.  The bleeding index is 'intermediate risk'.  Positive CHADS2 values include History of HTN and Prior Stroke/CVA/TIA.  Negative CHADS2 values include Age > 58 years old.  The start date was 07/07/2003.  Anticoagulation responsible provider: Diona Browner MD, Remi Deter.  INR POC: 2.5.  Cuvette Lot#: [id number].  Exp: 10/11.    Anticoagulation Management Assessment/Plan:      The patient's current anticoagulation dose is Coumadin 5 mg  tabs: take one tablet daily as directed by coumadin clinic.  The target INR is 2 - 3.  The next INR is due 01/05/2010.  Anticoagulation instructions were given to patient.  Results were reviewed/authorized by Vashti Hey, RN.  She was notified by Vashti Hey RN.         Prior Anticoagulation Instructions: INR 1.4 Take coumadin 1 extra tablet tonight then increase dose to 2.5mg  once daily except 5mg  on Tuesdays, Thursdays and Saturdays  Current Anticoagulation Instructions: INR 2.5 Continue coumadin 2.5mg  once daily except 5mg  on Tuesdays, Thursdays and Saturdays

## 2010-08-01 NOTE — Assessment & Plan Note (Signed)
Summary: F UP   Vital Signs:  Patient profile:   65 year old female Menstrual status:  postmenopausal Height:      66.5 inches Weight:      234.75 pounds BMI:     37.46 O2 Sat:      97 % Pulse rate:   80 / minute Pulse rhythm:   regular Resp:     16 per minute BP sitting:   124 / 84  (left arm) Cuff size:   large  Vitals Entered By: Everitt Amber LPN (January 10, 2010 10:04 AM)  Nutrition Counseling: Patient's BMI is greater than 25 and therefore counseled on weight management options. CC: Follow up chronic problems   Primary Care Provider:  Syliva Overman MD  CC:  Follow up chronic problems.  History of Present Illness: Reports  that they she has not been doing very well dur to increased and uncontrolled neck pain. Denies recent fever or chills. Denies sinus pressure, nasal congestion , ear pain or sore throat. Denies chest congestion, or cough productive of sputum. Denies chest pain, palpitations, PND, orthopnea or leg swelling. Denies abdominal pain, nausea, vomitting, diarrhea or constipation.  Denies change in bowel movements or bloody stool. Denies dysuria , frequency, incontinence or hesitancy.  Denies headaches, vertigo, seizures. Denies depression, anxiety or insomnia. Denies  rash, lesions, or itch.     Current Medications (verified): 1)  Lisinopril-Hydrochlorothiazide 20-25 Mg  Tabs (Lisinopril-Hydrochlorothiazide) .... Take One and One Half Tablet By Mouth Once Daily 2)  Coumadin 5 Mg  Tabs (Warfarin Sodium) .... Take One Tablet Daily As Directed By Coumadin Clinic 3)  Tylenol Arthritis Pain 650 Mg  Cr-Tabs (Acetaminophen) .... Take Two Tablets By Mouth Twice Daily 4)  Oscal 500/200 D-3 500-200 Mg-Unit  Tabs (Calcium-Vitamin D) .... Take 1 Tablet By Mouth Three Times A Day 5)  Multivitamins   Tabs (Multiple Vitamin) .... Take 1 Tablet By Mouth Once A Day 6)  Risperdal 0.25 Mg Tabs (Risperidone) .Marland Kitchen.. 1 At Bedtime 7)  Vicodin 5-500 Mg Tabs  (Hydrocodone-Acetaminophen) .... Take 1 Tab By Mouth At Bedtime As Needed 8)  Furosemide 20 Mg Tabs (Furosemide) .... One Tablet Three Times Per Week As Needed For Swelling of Legs or Fingers 9)  Klor-Con M20 20 Meq Cr-Tabs (Potassium Chloride Crys Cr) .... Take 1 Tablet By Mouth Two Times A Day On The Days That You Take The Lasix, Max of 3 Times Per Week 10)  Zyrtec Allergy 10 Mg Tabs (Cetirizine Hcl) .... One Tab By Mouth Once Daily  Discontinue Singulair 11)  Omeprazole 40 Mg Cpdr (Omeprazole) .... One Cap By Mouth Once Daily  Allergies (verified): 1)  ! Asa  Review of Systems      See HPI General:  Complains of fatigue; does not use the CPAP due to the inability to wear the mask comfortably  . Eyes:  Denies discharge and double vision. MS:  Complains of joint pain and stiffness; increased and uncontrolled neck pain reports  taking up to 4000mg  of otc  tylenol as well as extra strength tylenol left grt toe intermittently gets swollen and tender over 1 yr. Neuro:  abn sensation  with cold feeling  on lateral left leg from jus above the  knee to toes, seem sto be  worsening over the year. 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 Allergy:  Denies hives or rash and itching eyes.  Physical Exam  General:  Well-developedobese,in no acute distress; alert,appropriate  and cooperative throughout examination HEENT: No facial asymmetry,  EOMI, No sinus tenderness, TM's Clear, oropharynx  pink and moist. Decreased rOM cervical spine  Chest: Clear to auscultation bilaterally. No reproducible chest wall tenderness. CVS: S1, S2, No murmurs, No S3.   Abd: Soft, Nontender.  MS: Adequate ROM spine, hips, shoulders and knees.  Ext: No edema.   CNS: CN 2-12 intact, power tone and sensation normal throughout.   Skin: Intact, no visible lesions or rashes.  Psych: Good eye contact, normal affect.  Memory intact, not  depressed appearing.    Impression &  Recommendations:  Problem # 1:  IMPAIRED FASTING GLUCOSE (ICD-790.21) Assessment Comment Only  Her updated medication list for this problem includes:    Metformin Hcl 500 Mg Tabs (Metformin hcl) .Marland Kitchen... 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 Take one and one half tablet by mouth once daily    Furosemide 20 Mg Tabs (Furosemide) ..... One tablet three times per week as needed for swelling of legs or fingers  Orders: T-Basic Metabolic Panel (29562-13086)  BP today: 124/84 Prior BP: 120/82 (06/21/2009)  Labs Reviewed: K+: 3.4 (09/01/2008) Creat: : 0.79 (09/01/2008)   Chol: 178 (09/01/2008)   HDL: 37 (09/01/2008)   LDL: 113 (09/01/2008)   TG: 139 (09/01/2008)  Problem # 5:  DYSLIPIDEMIA (ICD-272.4) Assessment: Comment Only  Her updated medication list for this problem includes:    Pravastatin Sodium 40 Mg Tabs (Pravastatin sodium) .Marland Kitchen... Take 1 tab by mouth at bedtime  Orders: T-Hepatic Function [id number]) T-Lipid Profile (28413-24401)    HDL:37 (09/01/2008), 37 (08/31/2008)  LDL:113 (09/01/2008), 113 (08/31/2008)  Chol:178 (09/01/2008), 178 (08/31/2008)  Trig:139 (09/01/2008), 139 (08/31/2008)  Problem # 6:  NECK PAIN (ICD-723.1) Assessment: Deteriorated  The following medications were removed from the medication list:     Darvocet-n 100 100-650 Mg Tabs (Propoxyphene n-apap) .Marland Kitchen... Take 1 tab by mouth at bedtime    Tylenol Arthritis Pain 650 Mg Cr-tabs (Acetaminophen) .Marland Kitchen... Take two tablets by mouth twice daily    Vicodin 5-500 Mg Tabs (Hydrocodone-acetaminophen) .Marland Kitchen... Take 1 tab by mouth at bedtime as needed Her updated medication list for this problem includes:    Tramadol Hcl 50 Mg Tabs (Tramadol hcl) .Marland Kitchen... Take 1 tab by mouth at bedtime  Complete Medication List: 1)  Lisinopril-hydrochlorothiazide 20-25 Mg Tabs (Lisinopril-hydrochlorothiazide) .... Take one and one half tablet by mouth once daily 2)  Coumadin 5 Mg Tabs (Warfarin sodium) .... Take one tablet daily as directed by coumadin clinic 3)  Oscal 500/200 D-3 500-200 Mg-unit Tabs (Calcium-vitamin d) .... Take 1 tablet by mouth three times a day 4)  Multivitamins Tabs (Multiple vitamin) .... Take 1 tablet by mouth once a day 5)  Risperdal 0.25 Mg Tabs (Risperidone) .Marland Kitchen.. 1 at bedtime 6)  Furosemide 20 Mg Tabs (Furosemide) .... One tablet three times per week as needed for swelling of legs or fingers 7)  Klor-con M20 20 Meq Cr-tabs (Potassium chloride crys  cr) .... Take 1 tablet by mouth two times a day on the days that you take the lasix, max of 3 times per week 8)  Zyrtec Allergy 10 Mg Tabs (Cetirizine hcl) .... One tab by mouth once daily  discontinue singulair 9)  Omeprazole 40 Mg Cpdr (Omeprazole) .... One cap by mouth once daily 10)  Gabapentin 100 Mg Caps (Gabapentin) .... Take 1 tablet by mouth three times a day 11)  Tramadol Hcl 50 Mg Tabs (Tramadol hcl) .... Take 1 tab by mouth at bedtime 12)  Metformin Hcl 500 Mg Tabs (Metformin hcl) .... Take 1 tablet by mouth two times a day 13)  Pravastatin Sodium 40 Mg Tabs (Pravastatin sodium) .... Take 1 tab by mouth at bedtime  Other Orders: Neurosurgeon Referral (Neurosurgeon) Radiology Referral (Radiology)  Patient Instructions: 1)  Please schedule a follow-up appointment in 2 months. 2)  It is  important that you exercise regularly at least 20 minutes 5 times a week. If you develop chest pain, have severe difficulty breathing, or feel very tired , stop exercising immediately and seek medical attention. 3)  You need to lose weight. Consider a lower calorie diet and regular exercise.  4)  BMP prior to visit, ICD-9: 5)  Hepatic Panel prior to visit, ICD-9: 6)  Lipid Panel prior to visit, ICD-9: 7)  TSH prior to visit, ICD-9: 8)  CBC w/ Diff prior to visit, ICD-9:   fasing today pls 9)  HbgA1C prior to visit, ICD-9: 10)  you will be referred back to Dr Ovid Curd after a rept MRI of your neck. 11)  new pain meds 12)  Max tylenol ins four 500mg  tabs in 24 hrs Prescriptions: PRAVASTATIN SODIUM 40 MG TABS (PRAVASTATIN SODIUM) Take 1 tab by mouth at bedtime  #30 x 4   Entered and Authorized by:   Syliva Overman MD   Signed by:   Syliva Overman MD on 01/13/2010   Method used:   [facility]        [facility] [address] [address] [id number]       [address]       [place], [place]  [address]       Ph: [phone] or [phone]       Fax: [phone]   RxID:   [id number] METFORMIN HCL 500 MG TABS (METFORMIN HCL) Take 1 tablet by mouth two times a day  #60 x 3   Entered and Authorized by:   Syliva Overman MD   Signed by:   Syliva Overman MD on 01/13/2010   Method used:   [facility]ically [facility]        [facility] [address] [address] [facility]       [address]       [place], [place]  [address]       Ph: [phone] or [phone]       Fax: [phone]   RxID:   [id number] TRAMADOL HCL 50 MG TABS (TRAMADOL HCL) Take 1 tab by mouth at bedtime  #30 x 2   Entered and Authorized by:   Syliva Overman MD   Signed by:   Syliva Overman MD on 01/10/2010   Method used:   [facility]        [facility] [address] [address] [facility]       [address]  [place], [place]  [address]       Ph: [phone] or  [phone]       Fax: [phone]   RxID:   [id number] GABAPENTIN 100 MG CAPS (GABAPENTIN) Take 1 tablet by mouth three times a day  #90 x 2   Entered and Authorized by:   Syliva Overman MD   Signed by:   Syliva Overman MD on 01/10/2010   Method used:   [facility]ically [facility]        [facility] [address] [address] [facility]       [address]       [place], [place]  [address]       Ph: [phone] or [phone]       Fax: [phone]   RxID:   [id number]

## 2010-08-01 NOTE — Progress Notes (Signed)
Summary: STRIPS  Phone Note Call from Patient   Summary of Call: [facility] IN [facility] [place] Initial call taken by: Lind Guest,  January 23, 2010 1:38 PM  Follow-up for Phone Call        Rx Called In Follow-up by: Adella Hare LPN,  January 23, 2010 2:06 PM    New/Updated Medications: ACCU-CHEK AVIVA  STRP (GLUCOSE BLOOD) once daily testing Prescriptions: ACCU-CHEK AVIVA  STRP (GLUCOSE BLOOD) once daily testing  #100 x 1   Entered by:   Adella Hare LPN   Authorized by:   Syliva Overman MD   Signed by:   Adella Hare LPN on 52/84/1324   Method used:   [facility]        [facility] [id number]       [address]       [place], [place]  [address]       Ph: [phone] or [phone]       Fax: [phone]   RxID:   [id number]

## 2010-08-01 NOTE — Medication Information (Signed)
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 no    Recent/future dental: no  Any missed doses?: no       Is patient compliant with meds? yes       Allergies: 1)  ! Asa  Anticoagulation Management History:      The patient is taking warfarin and comes in today for a routine follow up visit.  Positive risk factors for bleeding include history of CVA/TIA.  Negative risk factors for bleeding include an age less than 79 years old.  The bleeding index is 'intermediate risk'.  Positive CHADS2 values include History of HTN and Prior Stroke/CVA/TIA.  Negative CHADS2 values include Age > 75 years old.  The start date was 07/07/2003.  Anticoagulation responsible provider: Daleen Squibb MD, Maisie Fus.  INR POC: 2.3.  Cuvette Lot#: [id number].  Exp: 10/11.    Anticoagulation Management Assessment/Plan:      The patient's current anticoagulation dose is Coumadin 5 mg  tabs: take one tablet daily as directed by coumadin clinic.  The target INR is 2 - 3.  The next INR is due 03/23/2010.  Anticoagulation instructions were given to patient.  Results were reviewed/authorized by Vashti Hey, RN.  She was notified by Vashti Hey RN.         Prior Anticoagulation Instructions: INR 2.3 Continue coumadin 2.5mg  once daily except 5mg  on Tuesdays, Thursdays and Saturdays  Current Anticoagulation Instructions: Same as Prior Instructions.

## 2010-08-01 NOTE — Progress Notes (Signed)
  Phone Note From Pharmacy   [facility] [address] [phone]* Summary of Call: singulair not covered alternative? Initial call taken by: Adella Hare LPN,  February 14, 2010 3:59 PM  Follow-up for Phone Call        This was removed from her med list March 2011.  Pls check with pt and see if she has still been taking this.  If yes, does she take if for her breathing or for allergies?  Follow-up by: Esperanza Sheets PA,  February 14, 2010 4:15 PM  Additional Follow-up for Phone Call Additional follow up Details #1::        loratadine 10mg  on [facility] $4 plan, patient agrees ok to send, is this okay?  [facility] [place] Additional Follow-up by: Adella Hare LPN,  February 15, 2010 9:00 AM    Additional Follow-up for Phone Call Additional follow up Details #2::    Yes, Loratadine is OK as long as pt was taking Singulair for her allergies.  If she is taking it though for her breathing then Loratadine may not be appropriate and she should have an appt to discuss other treatment options. Please document if she is taking Singulair for allergies or breathing. If for allergies then OK to prescribe Loratadine 10mg   #30 take 1 daily for allergies x 2 rf. Follow-up by: Esperanza Sheets PA,  February 15, 2010 11:13 AM  Additional Follow-up for Phone Call Additional follow up Details #3:: Details for Additional Follow-up Action Taken: rx sent, and patient aware  states was for allergies Additional Follow-up by: Adella Hare LPN,  February 15, 2010 11:34 AM  New/Updated Medications: LORATADINE 10 MG TABS (LORATADINE) one tab by mouth once daily Prescriptions: LORATADINE 10 MG TABS (LORATADINE) one tab by mouth once daily  #30 x 2   Entered by:   Adella Hare LPN   Authorized by:   Syliva Overman MD   Signed by:   Adella Hare LPN on 29/56/2130   Method used:   Electronically to        [facility] (retail)       [address]       [place], [place]  [address]       Ph:  [phone]       Fax: [phone]   RxID:   [id number]

## 2010-08-01 NOTE — Letter (Signed)
Summary: Appointment - Missed  [facility]  [address] [place] [address]   Phone: [phone]  Fax: [phone]     January 09, 2010 MRN: [id number]   [facility] [address] [place], [place]  [address]   Dear Ms. Hietpas,  Our records indicate you missed your appointment on      01/09/10 COUMADIN CHECK                   It is very important that we reach you to reschedule this appointment. We look forward to participating in your health care needs. Please contact us at the number listed above at your earliest convenience to reschedule this appointment.     Sincerely,    Glass blower/designer

## 2010-08-01 NOTE — Letter (Signed)
Summary: VANGUARD  VANGUARD   Imported By: Lind Guest 02/28/2010 08:37:09  _____________________________________________________________________  External Attachment:    Type:   Image     Comment:   External Document

## 2010-08-01 NOTE — Medication Information (Signed)
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 no    Recent/future dental: no  Any missed doses?: yes     Details: Missed 1 dose 1 week ago  Is patient compliant with meds? yes       Allergies: 1)  ! Asa  Anticoagulation Management History:      The patient is taking warfarin and comes in today for a routine follow up visit.  Positive risk factors for bleeding include history of CVA/TIA.  Negative risk factors for bleeding include an age less than 51 years old.  The bleeding index is 'intermediate risk'.  Positive CHADS2 values include History of HTN and Prior Stroke/CVA/TIA.  Negative CHADS2 values include Age > 45 years old.  The start date was 07/07/2003.  Anticoagulation responsible provider: Diona Browner Singleton, Remi Deter.  INR POC: 2.1.  Cuvette Lot#: [id number].  Exp: 10/11.    Anticoagulation Management Assessment/Plan:      The patient's current anticoagulation dose is Coumadin 5 mg  tabs: take one tablet daily as directed by coumadin clinic.  The target INR is 2 - 3.  The next INR is due 11/17/2009.  Anticoagulation instructions were given to patient.  Results were reviewed/authorized by Stephanie Hey, RN.  She was notified by Stephanie Hey RN.         Prior Anticoagulation Instructions: INR 1.6 Take coumadin 1 extra tablet tonight then resume 1/2 tablet once daily except 1 tablet on Tuesdays and Thursdays   Current Anticoagulation Instructions: INR 2.1 Continue coumadin 2.5mg  once daily except 5mg  on Tuesdays and Thursdays

## 2010-08-01 NOTE — Medication Information (Signed)
Summary: ccr-lr  Anticoagulant Therapy  Managed by: Vashti Hey, RN PCP: Syliva Overman MD Supervising MD: Dietrich Pates MD, Molly Maduro Indication 1: Pulmonary Embolism and Infarction (ICD-415.1) Lab Used: [facility] [place] Site: [place] INR POC 2.1  Dietary changes: no    Health status changes: no    Bleeding/hemorrhagic complications: no    Recent/future hospitalizations: no    Any changes in medication regimen? no    Recent/future dental: no  Any missed doses?: no       Is patient compliant with meds? yes       Allergies: 1)  ! Asa  Anticoagulation Management History:      The patient is taking warfarin and comes in today for a routine follow up visit.  Positive risk factors for bleeding include history of CVA/TIA and presence of serious comorbidities.  Negative risk factors for bleeding include an age less than 22 years old.  The bleeding index is 'intermediate risk'.  Positive CHADS2 values include History of HTN, History of Diabetes, and Prior Stroke/CVA/TIA.  Negative CHADS2 values include Age > 7 years old.  The start date was 07/07/2003.  Anticoagulation responsible provider: Dietrich Pates MD, Molly Maduro.  INR POC: 2.1.  Cuvette Lot#: [id number].  Exp: 10/11.    Anticoagulation Management Assessment/Plan:      The patient's current anticoagulation dose is Coumadin 5 mg  tabs: take one tablet daily as directed by coumadin clinic.  The target INR is 2 - 3.  The next INR is due 06/01/2010.  Anticoagulation instructions were given to patient.  Results were reviewed/authorized by Vashti Hey, RN.  She was notified by Vashti Hey RN.         Prior Anticoagulation Instructions: INR 1.9 Take coumadin 5mg  tomorrow night then resume 2.5mg  once daily except 5mg  on Tuesdays, Thursdays and Saturdays  Current Anticoagulation Instructions: INR 2.1 Continue coumadin 2.5mg  once daily except 5mg  on Tuesdays, Thursdays and Saturdays

## 2010-08-01 NOTE — Progress Notes (Signed)
Summary: med  Phone Note Call from Patient   Summary of Call: needs her risperidone refilled at [facility] [place] has appt tuesday Initial call taken by: Lind Guest,  January 06, 2010 9:49 AM  Follow-up for Phone Call        pls refill x1 Follow-up by: Syliva Overman MD,  January 06, 2010 12:43 PM  Additional Follow-up for Phone Call Additional follow up Details #1::        Rx Called In Additional Follow-up by: Adella Hare LPN,  January 06, 1750 3:34 PM    Prescriptions: RISPERDAL 0.25 MG TABS (RISPERIDONE) 1 at bedtime  #30 Tablet x 0   Entered by:   Adella Hare LPN   Authorized by:   Syliva Overman MD   Signed by:   Adella Hare LPN on 02/58/5277   Method used:   [facility]  [address] [address] [address] [facility]       [address]       [place], [place]  [address]       Ph: [phone] or [phone]       Fax: [phone]   RxID:   [id number]

## 2010-08-01 NOTE — Letter (Signed)
Summary: Custom - Delinquent Coumadin 1  [facility]  [address], [place] [address]   Phone: [phone]  Fax: [phone]     July 20, 2009 MRN: [id number]   [facility] [address] [place], [place]  [address]   Dear Stephanie Singleton,  This letter is being sent to you as a reminder that it is necessary for you to get your INR/PT checked regularly so that we can optimize your care.  Our records indicate that you were scheduled to have a test done recently.  As of today, we have not received the results of this test.  It is very important that you have your INR checked.  Please call our office at the number listed above to schedule an appointment at your earliest convenience.    If you have recently had your protime checked or have discontinued this medication, please contact our office at the above phone number to clarify this issue.  Thank you for this prompt attention to this important health care matter.  Sincerely, Vashti Hey RN  [facility] Reduction [facility] Team

## 2010-08-01 NOTE — Medication Information (Signed)
Summary: ccr  Anticoagulant Therapy  Managed by: Vashti Hey, RN PCP: Syliva Overman MD Supervising MD: Diona Browner MD, Remi Deter Indication 1: Pulmonary Embolism and Infarction (ICD-415.1) Lab Used: [facility] [place] Site: [place] INR POC 2.3  Dietary changes: no    Health status changes: no    Bleeding/hemorrhagic complications: no    Recent/future hospitalizations: no    Any changes in medication regimen? yes       Details: started on neurontin  Recent/future dental: no  Any missed doses?: no       Is patient compliant with meds? yes       Allergies: 1)  ! Asa  Anticoagulation Management History:      The patient is taking warfarin and comes in today for a routine follow up visit.  Positive risk factors for bleeding include history of CVA/TIA.  Negative risk factors for bleeding include an age less than 52 years old.  The bleeding index is 'intermediate risk'.  Positive CHADS2 values include History of HTN and Prior Stroke/CVA/TIA.  Negative CHADS2 values include Age > 71 years old.  The start date was 07/07/2003.  Anticoagulation responsible provider: Diona Browner MD, Remi Deter.  INR POC: 2.3.  Cuvette Lot#: [id number].  Exp: 10/11.    Anticoagulation Management Assessment/Plan:      The patient's current anticoagulation dose is Coumadin 5 mg  tabs: take one tablet daily as directed by coumadin clinic.  The target INR is 2 - 3.  The next INR is due 02/23/2010.  Anticoagulation instructions were given to patient.  Results were reviewed/authorized by Vashti Hey, RN.  She was notified by Vashti Hey RN.         Prior Anticoagulation Instructions: INR 2.5 Continue coumadin 2.5mg  once daily except 5mg  on Tuesdays, Thursdays and Saturdays  Current Anticoagulation Instructions: INR 2.3 Continue coumadin 2.5mg  once daily except 5mg  on Tuesdays, Thursdays and Saturdays

## 2010-08-01 NOTE — Assessment & Plan Note (Signed)
Summary: office visit   Vital Signs:  Patient profile:   65 year old female Menstrual status:  postmenopausal Height:      66.5 inches Weight:      235.75 pounds BMI:     37.62 O2 Sat:      97 % on Room air Pulse rate:   87 / minute Pulse rhythm:   regular Resp:     16 per minute BP sitting:   130 / 90  (left arm)  Vitals Entered By: Adella Hare LPN (March 14, 2010 9:28 AM)  Nutrition Counseling: Patient's BMI is greater than 25 and therefore counseled on weight management options.  O2 Flow:  Room air CC: follow-up visit Is Patient Diabetic? No Pain Assessment Patient in pain? no      Comments did not bring meds to ov   Primary Care Provider:  Syliva Overman MD  CC:  follow-up visit.  History of Present Illness: Reports  that she has been doing fairly well. 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 Denies recent fever or chills. Denies sinus pressure, nasal congestion , ear pain or sore throat.She reports right unilateral epistaxis when she blows hard for months, she does pick her nose also Denies chest congestion, or cough productive of sputum. Denies chest pain, palpitations, PND, orthopnea or leg swelling. Denies abdominal pain, nausea, vomitting, diarrhea or constipation. Denies change in bowel movements or bloody stool. Denies dysuria , frequency, incontinence or hesitancy. Denies  joint pain, swelling, or reduced mobility.She c/o "cool " feeling down lateral aspect of theleft lower extremity over the past several months from the buttock down. Denies headaches, vertigo, seizures. Denies depression, anxiety or insomnia. Denies  rash, lesions, or itch.     Allergies (verified): 1)  ! Asa  Review of Systems      See HPI General:  Complains of fatigue. ENT:  Complains of nosebleeds; unilateral right nasal bleeding when she blows hwer nose but has been picking at [facility] for 6 monhts Right  tender swelling in neck when she eats tart foods which goes away within an 1 hr x 2 months. Endo:  Denies excessive thirst and excessive urination; bedtime 108, morning 120 to 150. Heme:  Denies abnormal bruising and bleeding. Allergy:  Denies hives or rash and itching eyes.  Physical Exam  General:  Well-developedobese,in no acute distress; alert,appropriate and cooperative throughout examination HEENT: No facial asymmetry,  EOMI, No sinus tenderness, TM's Clear, oropharynx  pink and moist. Decreased rOM cervical spine  Chest: Clear to auscultation bilaterally. No reproducible chest wall tenderness. CVS: S1, S2, No murmurs, No S3.   Abd: Soft, Nontender.  MS: Adequate ROM spine, hips, shoulders and knees.  Ext: No edema.   CNS: CN 2-12 intact, power tone and sensation normal throughout.   Skin: Intact, no visible lesions or rashes.  Psych: Good eye contact, normal affect.  Memory intact, not  depressed appearing.   Diabetes Management Exam:    Foot Exam (with socks and/or shoes not present):       Sensory-Monofilament:          Left foot: normal          Right foot: normal       Inspection:          Left foot: normal          Right foot: normal       Nails:  Left foot: normal          Right foot: normal   Impression & Recommendations:  Problem # 1:  DIABETES MELLITUS, TYPE II (ICD-250.00) Assessment Comment Only  The following medications were removed from the medication list:    Metformin Hcl 500 Mg Tabs (Metformin hcl) .Marland Kitchen... Take 1 tablet by mouth two times a day    Metformin Hcl 1000 Mg Tabs (Metformin hcl) .Marland Kitchen... Take 1 tablet by mouth once a day Her updated medication list for this problem includes:    Lisinopril-hydrochlorothiazide 20-25 Mg Tabs (Lisinopril-hydrochlorothiazide) .Marland Kitchen... Take one and one half tablet by mouth once daily    Metformin Hcl 500 Mg Tabs (Metformin hcl) .Marland Kitchen... Take 1 tablet by mouth two times a day Patient advised to reduce carbs and  sweets, commit to regular physical activity, take meds as prescribed, test blood sugars as directed, and attempt to lose weight , to improve blood sugar control.  Orders: T- Hemoglobin A1C ([id number])  Labs Reviewed: Creat: 1.08 (01/11/2010)    Reviewed HgBA1c results: 6.7 (01/11/2010)  6.4 (05/17/2009)  Problem # 2:  OBESITY (ICD-278.00) Assessment: Deteriorated  Ht: 66.5 (03/14/2010)   Wt: 235.75 (03/14/2010)   BMI: 37.62 (03/14/2010) therapeutic lifestyle change discussed and encouraged  Problem # 3:  HYPERTENSION (ICD-401.9) Assessment: Deteriorated  Her updated medication list for this problem includes:    Lisinopril-hydrochlorothiazide 20-25 Mg Tabs (Lisinopril-hydrochlorothiazide) .Marland Kitchen... Take one and one half tablet by mouth once daily    Furosemide 20 Mg Tabs (Furosemide) ..... One tablet three times per week as needed for swelling of legs or fingers Check your Blood Pressure regularly. If it is above150/95  you should make an appointment.  Orders: T-Basic Metabolic Panel [id number])  BP today: 130/90 Prior BP: 124/84 (01/10/2010)  Labs Reviewed: K+: 4.2 (01/11/2010) Creat: : 1.08 (01/11/2010)   Chol: 211 (01/11/2010)   HDL: 46 (01/11/2010)   LDL: 133 (01/11/2010)   TG: 158 (01/11/2010)  Problem # 4:  DYSLIPIDEMIA (ICD-272.4) Assessment: Comment Only  Her updated medication list for this problem includes:    Pravastatin Sodium 40 Mg Tabs (Pravastatin sodium) .Marland Kitchen... Take 1 tab by mouth at bedtime Low fat diet discussed and encouraged, and literature also given   Orders: T-Hepatic Function [id number]) T-Lipid Profile [id number])  Labs Reviewed: SGOT: 23 (01/11/2010)   SGPT: 14 (01/11/2010)   HDL:46 (01/11/2010), 37 (09/01/2008)  LDL:133 (01/11/2010), 113 (09/01/2008)  Chol:211 (01/11/2010), 178 (09/01/2008)  Trig:158 (01/11/2010), 139 (09/01/2008)  Complete Medication List: 1)  Lisinopril-hydrochlorothiazide 20-25 Mg Tabs (Lisinopril-hydrochlorothiazide)  .... Take one and one half tablet by mouth once daily 2)  Coumadin 5 Mg Tabs (Warfarin sodium) .... Take one tablet daily as directed by coumadin clinic 3)  Oscal 500/200 D-3 500-200 Mg-unit Tabs (Calcium-vitamin d) .... Take 1 tablet by mouth three times a day 4)  Multivitamins Tabs (Multiple vitamin) .... Take 1 tablet by mouth once a day 5)  Risperdal 0.25 Mg Tabs (Risperidone) .Marland Kitchen.. 1 at bedtime 6)  Furosemide 20 Mg Tabs (Furosemide) .... One tablet three times per week as needed for swelling of legs or fingers 7)  Klor-con M20 20 Meq Cr-tabs (Potassium chloride crys cr) .... Take 1 tablet by mouth two times a day on the days that you take the lasix, max of 3 times per week 8)  Zyrtec Allergy 10 Mg Tabs (Cetirizine hcl) .... One tab by mouth once daily  discontinue singulair 9)  Omeprazole 40 Mg Cpdr (Omeprazole) .... One cap by mouth once daily 10)  Gabapentin 100 Mg Caps (Gabapentin) .... Take 1 tablet by mouth three times a day 11)  Tramadol Hcl 50 Mg Tabs (Tramadol hcl) .... Take 1 tab by mouth at bedtime 12)  Pravastatin Sodium 40 Mg Tabs (Pravastatin sodium) .... Take 1 tab by mouth at bedtime 13)  Accu-chek Aviva Strp (Glucose blood) .... Once daily testing dx:250.00 14)  Loratadine 10 Mg Tabs (Loratadine) .... One tab by mouth once daily 15)  Accu-chek Multiclix Lancets Misc (Lancets) .... Once daily testing 16)  Metformin Hcl 500 Mg Tabs (Metformin hcl) .... Take 1 tablet by mouth two times a day 17)  Phentermine Hcl 37.5 Mg Tabs (Phentermine hcl) .... Take 1 tablet by mouth once a day  Other Orders: T-Vitamin D (25-Hydroxy) ([id number])  Patient Instructions: 1)  f/u first week of November. 2)  BMP prior to visit, ICD-9: 3)  Hepatic Panel prior to visit, ICD-9: 4)  Lipid Panel prior to visit, ICD-9:  fasting end October 5)  HbgA1C prior to visit, ICD-9: 6)  vit d 7)  It is important that you exercise regularly at least 30 minutes 5 times a week. If you develop chest pain,  have severe difficulty breathing, or feel very tired , stop exercising immediately and seek medical attention. 8)  You need to lose weight. Consider a lower calorie diet and regular exercise.  Prescriptions: PHENTERMINE HCL 37.5 MG TABS (PHENTERMINE HCL) Take 1 tablet by mouth once a day  #30 x 1   Entered and Authorized by:   Syliva Overman MD   Signed by:   Syliva Overman MD on 03/14/2010   Method used:   Printed then faxed to ...       [facility] [address] [id number] (retail)       [address]       [place], [place]  [address]       Ph: [phone] or [phone]       Fax: [phone]   RxID:   [id number] METFORMIN HCL 500 MG TABS (METFORMIN HCL) Take 1 tablet by mouth two times a day  #60 x 3   Entered and Authorized by:   Syliva Overman MD   Signed by:   Syliva Overman MD on 03/14/2010   Method used:   Electronically to        [facility]  [address] [address] [id number] (retail)       [address]       [place], [place]  [address]       Ph: [phone] or [phone]       Fax: [phone]   RxID:   [id number] METFORMIN HCL 1000 MG TABS (METFORMIN HCL) Take 1 tablet by mouth once a day  #30 x 3   Entered and Authorized by:   Syliva Overman MD   Signed by:   Syliva Overman MD on 03/14/2010   Method used:   Printed then faxed to ...       [facility]  [address] [address] [id number] ([facility]ail)       [address]       [place], [place]  [address]       Ph: [phone] or [phone]       Fax: [phone]   RxID:   [id number]

## 2010-08-03 ENCOUNTER — Ambulatory Visit (INDEPENDENT_AMBULATORY_CARE_PROVIDER_SITE_OTHER): Payer: Self-pay | Admitting: Otolaryngology

## 2010-08-03 NOTE — Letter (Signed)
Summary: Appointment - Missed  [facility] [place]  [address] [place] [address]   Phone: [phone]  Fax: [phone]     July 24, 2010 MRN: [id number]   [facility] [address] [place], [place]  [address]   Dear Stephanie Singleton,  Our records indicate you missed your appointment on        07/24/10 COUMADIN CLINIC                          It is very important that we reach you to reschedule this appointment. We look forward to participating in your health care needs. Please contact us at the number listed above at your earliest convenience to reschedule this appointment.     Sincerely,    Glass blower/designer

## 2010-08-03 NOTE — Medication Information (Signed)
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 INR POC 1.4  Dietary changes: no    Health status changes: no    Bleeding/hemorrhagic complications: no    Recent/future hospitalizations: no    Any changes in medication regimen? no    Recent/future dental: no  Any missed doses?: yes     Details: missed 1 - 2 days  Is patient compliant with meds? yes       Allergies: 1)  ! Asa  Anticoagulation Management History:      The patient is taking warfarin and comes in today for a routine follow up visit.  Positive risk factors for bleeding include history of CVA/TIA and presence of serious comorbidities.  Negative risk factors for bleeding include an age less than 36 years old.  The bleeding index is 'intermediate risk'.  Positive CHADS2 values include History of HTN, History of Diabetes, and Prior Stroke/CVA/TIA.  Negative CHADS2 values include Age > 76 years old.  The start date was 07/07/2003.  Anticoagulation responsible provider: Dietrich Pates MD, Molly Maduro.  INR POC: 1.4.  Cuvette Lot#: [id number].  Exp: 10/11.    Anticoagulation Management Assessment/Plan:      The patient's current anticoagulation dose is Coumadin 5 mg  tabs: take one tablet daily as directed by coumadin clinic.  The target INR is 2 - 3.  The next INR is due 07/12/2010.  Anticoagulation instructions were given to patient.  Results were reviewed/authorized by Vashti Hey, RN.  She was notified by Vashti Hey RN.         Prior Anticoagulation Instructions: INR 2.2 Continue coumadin 2.5mg  once daily except 5mg  on Mondays, Thursdays and Saturdays  Current Anticoagulation Instructions: INR 1.4 Take coumadin 2 tablets tonight, 1 tablet tomorrow then resume 1/2 tablet once daily except 1 tablet on Mondays, Thursdays and  Saturdays

## 2010-08-03 NOTE — Letter (Signed)
Summary: Appointment - Missed  [facility] [place]  [address], [place] [address]   Phone: [phone]  Fax: [phone]     July 19, 2010 MRN: [id number]   [facility] [address] [place], [place]  [address]   Dear Ms. Mangham,  Our records indicate you missed your appointment on      07/19/10                  with Dr.       .      MCDOWELL                                 It is very important that we reach you to reschedule this appointment. We look forward to participating in your health care needs. Please contact us at the number listed above at your earliest convenience to reschedule this appointment.     Sincerely,    Glass blower/designer

## 2010-08-09 NOTE — Medication Information (Signed)
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 no    Recent/future dental: no  Any missed doses?: yes     Details: Coumadin held 2 days at time of nose bleed    Allergies: 1)  ! Asa  Anticoagulation Management History:      The patient is taking warfarin and comes in today for a routine follow up visit.  Positive risk factors for bleeding include history of CVA/TIA and presence of serious comorbidities.  Negative risk factors for bleeding include an age less than 38 years old.  The bleeding index is 'intermediate risk'.  Positive CHADS2 values include History of HTN, History of Diabetes, and Prior Stroke/CVA/TIA.  Negative CHADS2 values include Age > 19 years old.  The start date was 07/07/2003.  Anticoagulation responsible provider: Dietrich Pates MD, Molly Maduro.  INR POC: 1.3.  Cuvette Lot#: [id number].  Exp: 10/11.    Anticoagulation Management Assessment/Plan:      The patient's current anticoagulation dose is Coumadin 5 mg  tabs: take one tablet daily as directed by coumadin clinic.  The target INR is 2 - 3.  The next INR is due 08/16/2010.  Anticoagulation instructions were given to patient.  Results were reviewed/authorized by Vashti Hey, RN.  She was notified by Vashti Hey RN.         Prior Anticoagulation Instructions: INR 1.4 Take coumadin 2 tablets tonight, 1 tablet tomorrow then resume 1/2 tablet once daily except 1 tablet on Mondays, Thursdays and Saturdays  Current Anticoagulation Instructions: INR 1.3 Take coumadin extra  1/2 tablet tonight, take 1 tablet tomorrow night then resume 1/2 tablet once daily except 1 tablet on Mondays, Thursdays and Saturdays

## 2010-08-09 NOTE — Progress Notes (Signed)
  Phone Note Other Incoming   Caller: dr simpson Summary of Call: pls call pt and let her know I see where she was in the Ed with right nose bleed . Pls let her know I advise ENT eval, and refer her to Dr Danice Goltz if she agrees , I will put in order Initial call taken by: Syliva Overman MD,  July 31, 2010 7:54 AM  Follow-up for Phone Call        SHE LREADY HAS AN APPT WITH DR.THEO Follow-up by: Lind Guest,  July 31, 2010 10:27 AM  Additional Follow-up for Phone Call Additional follow up Details #1::        thanks Additional Follow-up by: Syliva Overman MD,  July 31, 2010 12:29 PM  New Problems: EPISTAXIS, RECURRENT (ICD-784.7)   New Problems: EPISTAXIS, RECURRENT (ICD-784.7)

## 2010-08-11 ENCOUNTER — Other Ambulatory Visit (HOSPITAL_COMMUNITY): Payer: Self-pay | Admitting: Orthopedic Surgery

## 2010-08-14 ENCOUNTER — Other Ambulatory Visit (HOSPITAL_COMMUNITY): Payer: Self-pay | Admitting: Orthopedic Surgery

## 2010-08-14 ENCOUNTER — Ambulatory Visit (HOSPITAL_COMMUNITY)
Admission: RE | Admit: 2010-08-14 | Discharge: 2010-08-14 | Disposition: A | Discharge status: Home / Self Care | Payer: Medicare Other | Source: Ambulatory Visit | Attending: Orthopedic Surgery | Admitting: Orthopedic Surgery

## 2010-08-14 DIAGNOSIS — M259 Joint disorder, unspecified: Secondary | ICD-10-CM | POA: Insufficient documentation

## 2010-08-14 DIAGNOSIS — M75100 Unspecified rotator cuff tear or rupture of unspecified shoulder, not specified as traumatic: Secondary | ICD-10-CM | POA: Insufficient documentation

## 2010-08-14 DIAGNOSIS — M9420 Chondromalacia, unspecified site: Secondary | ICD-10-CM | POA: Insufficient documentation

## 2010-08-14 DIAGNOSIS — IMO0002 Reserved for concepts with insufficient information to code with codable children: Secondary | ICD-10-CM | POA: Insufficient documentation

## 2010-08-14 MED ORDER — GADOBENATE DIMEGLUMINE 529 MG/ML IV SOLN: 20.0000 mL | Freq: Once | INTRAVENOUS | Status: AC | PRN

## 2010-08-16 ENCOUNTER — Encounter: Payer: Self-pay | Admitting: Cardiology

## 2010-08-16 ENCOUNTER — Encounter (INDEPENDENT_AMBULATORY_CARE_PROVIDER_SITE_OTHER): Payer: Medicare Other

## 2010-08-16 DIAGNOSIS — Z7901 Long term (current) use of anticoagulants: Secondary | ICD-10-CM

## 2010-08-16 DIAGNOSIS — I2699 Other pulmonary embolism without acute cor pulmonale: Secondary | ICD-10-CM

## 2010-08-16 LAB — CONVERTED CEMR LAB: POC INR: 2.8

## 2010-08-17 NOTE — Assessment & Plan Note (Signed)
Summary: office visit   Vital Signs:  Patient profile:   65 year old female Menstrual status:  postmenopausal Height:      66.5 inches Weight:      235.50 pounds BMI:     37.58 O2 Sat:      99 % Pulse rate:   86 / minute Pulse rhythm:   regular Resp:     16 per minute BP sitting:   160 / 98  (left arm) Cuff size:   large  Vitals Entered By: Everitt Amber LPN (July 19, 2010 4:28 PM)  Nutrition Counseling: Patient's BMI is greater than 25 and therefore counseled on weight management options. CC: Follow up chronic problems, right shoulder pain, tension, patient states feels like her joints are pulling apart. Still having nosebleeds daily  Pain Assessment Patient in pain? yes     Location: right shoulder Intensity: 9 Type: aching Onset of pain  a few months ago, getting worse   Primary Care Provider:  Syliva Overman MD  CC:  Follow up chronic problems, right shoulder pain, tension, and patient states feels like her joints are pulling apart. Still having nosebleeds daily .  History of Present Illness: Reports  that she has been fairly well.  Denies recent fever or chills. Denies sinus pressure, nasal congestion , ear pain or sore throat. Denies chest congestion, or cough productive of sputum. Denies chest pain, palpitations, PND, orthopnea or leg swelling. Denies abdominal pain, nausea, vomitting, diarrhea or constipation. Denies change in bowel movements or bloody stool. Denies dysuria , frequency, incontinence or hesitancy.  Denies headaches, vertigo, seizures. Denies depression, anxiety or insomnia. Denies  rash, lesions, or itch.     Current Medications (verified): 1)  Lisinopril-Hydrochlorothiazide 20-25 Mg  Tabs (Lisinopril-Hydrochlorothiazide) .... Take One and One Half Tablet By Mouth Once Daily 2)  Coumadin 5 Mg  Tabs (Warfarin Sodium) .... Take One Tablet Daily As Directed By Coumadin Clinic 3)  Oscal 500/200 D-3 500-200 Mg-Unit  Tabs (Calcium-Vitamin D)  .... Take 1 Tablet By Mouth Three Times A Day 4)  Multivitamins   Tabs (Multiple Vitamin) .... Take 1 Tablet By Mouth Once A Day 5)  Risperdal 0.25 Mg Tabs (Risperidone) .Marland Kitchen.. 1 At Bedtime 6)  Furosemide 20 Mg Tabs (Furosemide) .... One Tablet Three Times Per Week As Needed For Swelling of Legs or Fingers 7)  Klor-Con M20 20 Meq Cr-Tabs (Potassium Chloride Crys Cr) .... Take 1 Tablet By Mouth Two Times A Day On The Days That You Take The Lasix, Max of 3 Times Per Week 8)  Omeprazole 40 Mg Cpdr (Omeprazole) .... One Cap By Mouth Once Daily 9)  Gabapentin 100 Mg Caps (Gabapentin) .... Take 1 Tablet By Mouth Three Times A Day 10)  Tramadol Hcl 50 Mg Tabs (Tramadol Hcl) .... Take 1 Tab By Mouth At Bedtime 11)  Pravastatin Sodium 40 Mg Tabs (Pravastatin Sodium) .... Take 1 Tab By Mouth At Bedtime 12)  Accu-Chek Aviva  Strp (Glucose Blood) .... Once Daily Testing Dx:250.00 13)  Loratadine 10 Mg Tabs (Loratadine) .... One Tab By Mouth Once Daily 14)  Accu-Chek Multiclix Lancets  Misc (Lancets) .... Once Daily Testing 15)  Metformin Hcl 500 Mg Tabs (Metformin Hcl) .... Take 1 Tablet By Mouth Two Times A Day 16)  Phentermine Hcl 37.5 Mg Tabs (Phentermine Hcl) .... Take 1 Tablet By Mouth Once A Day  Allergies (verified): 1)  ! Asa  Review of Systems      See HPI General:  Complains of  fatigue and sleep disorder. Eyes:  Denies discharge and red eye. ENT:  Complains of nosebleeds; right epistaxis for months daily, needs to see ent. MS:  Complains of joint pain and stiffness; 4 month h/o right shoulder pain with reduced mobility, no inciting trauma radiate to her fingers. Psych:  Denies anxiety and depression. Endo:  Denies cold intolerance, excessive hunger, excessive thirst, excessive urination, and heat intolerance. Heme:  Denies abnormal bruising, bleeding, enlarge lymph nodes, and fevers. Allergy:  Denies hives or rash and itching eyes.  Physical Exam  General:  Well-developedobese,in no acute  distress; alert,appropriate and cooperative throughout examination HEENT: No facial asymmetry,  EOMI, No sinus tenderness, TM's Clear, oropharynx  pink and moist. Decreased rOM cervical spine  Chest: Clear to auscultation bilaterally. No reproducible chest wall tenderness. CVS: S1, S2, No murmurs, No S3.   Abd: Soft, Nontender.  MS: Adequate ROM spine, hips, decreased ROM shoulder and knees.  Ext: No edema.   CNS: CN 2-12 intact, power tone and sensation normal throughout.   Skin: Intact, no visible lesions or rashes.  Psych: Good eye contact, normal affect.  Memory intact, not  depressed appearing.    Impression & Recommendations:  Problem # 1:  SHOULDER PAIN, RIGHT (ICD-719.41) Assessment Deteriorated  Her updated medication list for this problem includes:    Tramadol Hcl 50 Mg Tabs (Tramadol hcl) .Marland Kitchen... 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 07/20/2010  Problem # 3:  DIABETES MELLITUS, TYPE II (ICD-250.00) Assessment: Deteriorated  Her updated medication list for this problem includes:    Lisinopril-hydrochlorothiazide 20-25 Mg Tabs (Lisinopril-hydrochlorothiazide) .Marland Kitchen... Take one and one half tablet by mouth once daily    Metformin Hcl 500 Mg Tabs (Metformin hcl) .Marland Kitchen... Take 1 tablet by mouth two times a day pt non compliant witth meds Orders: T- Hemoglobin A1C ([id number]) T-Urine Microalbumin w/creat. ratio [phone])  Labs Reviewed: Creat: 1.08 (01/11/2010)    Reviewed HgBA1c results: 6.7 (01/11/2010)  6.4 (05/17/2009)  Problem # 4:  HYPERTENSION (ICD-401.9) Assessment: Deteriorated  Her updated medication list for this problem includes:    Lisinopril-hydrochlorothiazide 20-25 Mg Tabs (Lisinopril-hydrochlorothiazide) .Marland Kitchen... Take one and one half tablet by mouth once daily    Furosemide 20 Mg Tabs (Furosemide) .....  One tablet three times per week as needed for swelling of legs or fingers  BP today: 160/98 Prior BP: 130/90 (03/14/2010)  Labs Reviewed: K+: 4.2 (01/11/2010) Creat: : 1.08 (01/11/2010)   Chol: 211 (01/11/2010)   HDL: 46 (01/11/2010)   LDL: 133 (01/11/2010)   TG: 158 (01/11/2010)  Complete Medication List: 1)  Lisinopril-hydrochlorothiazide 20-25 Mg Tabs (Lisinopril-hydrochlorothiazide) .... Take one and one half tablet by mouth once daily 2)  Coumadin 5 Mg Tabs (Warfarin sodium) .... Take one tablet daily as directed by coumadin clinic 3)  Oscal 500/200 D-3 500-200 Mg-unit Tabs (Calcium-vitamin d) .... Take 1 tablet by mouth three times a day 4)  Multivitamins Tabs (Multiple vitamin) .... Take 1 tablet by mouth once a day 5)  Risperdal 0.25 Mg Tabs (Risperidone) .Marland Kitchen.. 1 at bedtime 6)  Furosemide 20 Mg Tabs (Furosemide) .... One tablet three times per week as needed for swelling of legs or fingers 7)  Klor-con M20 20 Meq Cr-tabs (Potassium chloride crys cr) .... Take 1 tablet by mouth two times a day on the days that you take the lasix, max of 3 times per week 8)  Omeprazole 40 Mg Cpdr (Omeprazole) .... One cap by mouth once daily 9)  Gabapentin 100 Mg Caps (Gabapentin) .... Take 1 tablet by mouth three times a day 10)  Tramadol Hcl 50 Mg Tabs (Tramadol hcl) .... Take 1 tab by mouth at bedtime 11)  Pravastatin Sodium 40 Mg Tabs (Pravastatin sodium) .... Take 1 tab by mouth at bedtime 12)  Accu-chek Aviva Strp (Glucose blood) .... Once daily testing dx:250.00 13)  Loratadine 10 Mg Tabs (Loratadine) .... One tab by mouth once daily 14)  Accu-chek Multiclix Lancets Misc (Lancets) .... Once daily testing 15)  Metformin Hcl 500 Mg Tabs (Metformin hcl) .... Take 1 tablet by mouth two times a day 16)  Phentermine Hcl 37.5 Mg Tabs (Phentermine hcl) .... Take 1 tablet by mouth once a day  Other Orders: Medicare Electronic Prescription [phone]) T-Basic Metabolic Panel [id number])  Patient  Instructions: 1)  Please schedule a follow-up appointment in 4 months. 2)  It is important that you exercise regularly at least 20 minutes 5 times a week. If you develop chest pain, have severe difficulty breathing, or feel very tired , stop exercising immediately and seek medical attention. 3)  You need to lose weight. Consider a lower calorie diet and regular exercise.  4)  HbgA1C prior to visit, ICD-9: 5)  BMP prior to visit, ICD-9:   today 6)  We will send  urine for microalbumin 7)  Pls resume meds as before. 8)  Your BP is too high. 9)  You are referred to orthopedics and to ENT 10)       Orders Added: 1)  Orthopedic Referral [Ortho] 2)  Est. Patient Level IV [86578] 3)  Medicare Electronic Prescription [G8553] 4)  T-Basic Metabolic Panel [[id number]] 5)  T- Hemoglobin A1C [[id number]] 6)  T-Urine Microalbumin w/creat. ratio [[id number]] 7)  ENT Referral [ENT]

## 2010-08-23 ENCOUNTER — Other Ambulatory Visit (HOSPITAL_COMMUNITY)
Admission: RE | Admit: 2010-08-23 | Discharge: 2010-08-23 | Disposition: A | Discharge status: Home / Self Care | Payer: Medicare Other | Source: Ambulatory Visit | Attending: Obstetrics and Gynecology | Admitting: Obstetrics and Gynecology

## 2010-08-23 ENCOUNTER — Other Ambulatory Visit: Payer: Self-pay | Admitting: Obstetrics and Gynecology

## 2010-08-23 DIAGNOSIS — Z124 Encounter for screening for malignant neoplasm of cervix: Secondary | ICD-10-CM | POA: Insufficient documentation

## 2010-08-23 NOTE — Medication Information (Signed)
Summary: ccr-lr  Anticoagulant Therapy  Managed by: Stephanie Hey, RN PCP: Stephanie Singleton Supervising Singleton: Stephanie Singleton, Stephanie Singleton Indication 1: Pulmonary Embolism and Infarction (ICD-415.1) Lab Used: [facility] Blanket Site: [place] INR POC 2.8  Dietary changes: no    Health status changes: no    Bleeding/hemorrhagic complications: no    Recent/future hospitalizations: no    Any changes in medication regimen? no    Recent/future dental: no  Any missed doses?: no       Is patient compliant with meds? yes       Allergies: 1)  ! Asa  Anticoagulation Management History:      The patient is taking warfarin and comes in today for a routine follow up visit.  Positive risk factors for bleeding include history of CVA/TIA and presence of serious comorbidities.  Negative risk factors for bleeding include an age less than 51 years old.  The bleeding index is 'intermediate risk'.  Positive CHADS2 values include History of HTN, History of Diabetes, and Prior Stroke/CVA/TIA.  Negative CHADS2 values include Age > 94 years old.  The start date was 07/07/2003.  Anticoagulation responsible provider: Diona Singleton Singleton, Stephanie Singleton.  INR POC: 2.8.  Cuvette Lot#: [id number].  Exp: 10/11.    Anticoagulation Management Assessment/Plan:      The patient's current anticoagulation dose is Coumadin 5 mg  tabs: take one tablet daily as directed by coumadin clinic.  The target INR is 2 - 3.  The next INR is due 09/13/2010.  Anticoagulation instructions were given to patient.  Results were reviewed/authorized by Stephanie Hey, RN.  She was notified by Stephanie Hey RN.         Prior Anticoagulation Instructions: INR 1.3 Take coumadin extra 1/2 tablet tonight, take 1 tablet tomorrow night then resume 1/2 tablet once daily except 1 tablet on Mondays, Thursdays and Saturdays  Current Anticoagulation Instructions: INR 2.8 Continue coumadin 2.5mg  once daily except 5mg  on Mondays, Thursdays and  Saturdays

## 2010-09-05 ENCOUNTER — Other Ambulatory Visit: Payer: Self-pay | Admitting: *Deleted

## 2010-09-05 ENCOUNTER — Telehealth: Payer: Self-pay | Admitting: Family Medicine

## 2010-09-11 ENCOUNTER — Emergency Department (HOSPITAL_COMMUNITY)
Admission: EM | Admit: 2010-09-11 | Discharge: 2010-09-12 | Disposition: A | Discharge status: Home / Self Care | Payer: Medicare Other | Attending: Emergency Medicine | Admitting: Emergency Medicine

## 2010-09-11 ENCOUNTER — Telehealth (INDEPENDENT_AMBULATORY_CARE_PROVIDER_SITE_OTHER): Payer: Self-pay | Admitting: *Deleted

## 2010-09-11 ENCOUNTER — Other Ambulatory Visit: Payer: Self-pay | Admitting: Family Medicine

## 2010-09-11 DIAGNOSIS — R5382 Chronic fatigue, unspecified: Secondary | ICD-10-CM | POA: Insufficient documentation

## 2010-09-11 DIAGNOSIS — N644 Mastodynia: Secondary | ICD-10-CM

## 2010-09-11 DIAGNOSIS — Z79899 Other long term (current) drug therapy: Secondary | ICD-10-CM | POA: Insufficient documentation

## 2010-09-11 DIAGNOSIS — R040 Epistaxis: Secondary | ICD-10-CM | POA: Insufficient documentation

## 2010-09-11 DIAGNOSIS — Z7901 Long term (current) use of anticoagulants: Secondary | ICD-10-CM | POA: Insufficient documentation

## 2010-09-11 DIAGNOSIS — G9332 Myalgic encephalomyelitis/chronic fatigue syndrome: Secondary | ICD-10-CM | POA: Insufficient documentation

## 2010-09-11 DIAGNOSIS — K219 Gastro-esophageal reflux disease without esophagitis: Secondary | ICD-10-CM | POA: Insufficient documentation

## 2010-09-11 DIAGNOSIS — Z86711 Personal history of pulmonary embolism: Secondary | ICD-10-CM | POA: Insufficient documentation

## 2010-09-11 DIAGNOSIS — E119 Type 2 diabetes mellitus without complications: Secondary | ICD-10-CM | POA: Insufficient documentation

## 2010-09-11 DIAGNOSIS — I10 Essential (primary) hypertension: Secondary | ICD-10-CM | POA: Insufficient documentation

## 2010-09-12 ENCOUNTER — Telehealth: Payer: Self-pay | Admitting: Family Medicine

## 2010-09-12 LAB — PROTIME-INR: INR: 2.94 — ABNORMAL HIGH (ref 0.00–1.49)

## 2010-09-13 ENCOUNTER — Ambulatory Visit
Admission: RE | Admit: 2010-09-13 | Discharge: 2010-09-13 | Disposition: A | Discharge status: Home / Self Care | Payer: Medicare Other | Source: Ambulatory Visit | Attending: Family Medicine | Admitting: Family Medicine

## 2010-09-13 ENCOUNTER — Encounter (INDEPENDENT_AMBULATORY_CARE_PROVIDER_SITE_OTHER): Payer: Self-pay | Admitting: *Deleted

## 2010-09-13 DIAGNOSIS — N644 Mastodynia: Secondary | ICD-10-CM

## 2010-09-19 NOTE — Progress Notes (Signed)
Summary: needs a diag. mammo  Phone Note Call from Patient   Summary of Call: patient called and stated that the [facility] called her and said that she needs a referral to do a diag. mammo. She does not want a Monday appoinment and rather have a appoinment in the evening. Please thank you. Initial call taken by: Lind Guest,  September 05, 2010 1:39 PM  Follow-up for Phone Call        pls send for last mamogram report from breast center,so I can figure out what she needs, nmothing is scanned in Follow-up by: Syliva Overman MD,  September 05, 2010 5:11 PM  Additional Follow-up for Phone Call Additional follow up Details #1::        request sent Additional Follow-up by: Adella Hare LPN,  September 06, 2010 10:06 AM    Additional Follow-up for Phone Call Additional follow up Details #2::    noted pls let pt knopw whats going on f/u on this and put in my folder when you get it Follow-up by: Syliva Overman MD,  September 06, 2010 1:30 PM  Additional Follow-up for Phone Call Additional follow up Details #3:: Details for Additional Follow-up Action Taken: see note 09/11/10 Additional Follow-up by: Adella Hare LPN,  September 11, 2010 1:37 PM

## 2010-09-19 NOTE — Letter (Signed)
Summary: Appointment - Missed  [facility] [place]  [address] [place] [address]   Phone: [phone]  Fax: [phone]     September 13, 2010 MRN: [id number]   [facility] [address] [place], [place]  [address]   Dear Stephanie Singleton,  Our records indicate you missed your appointment on         09/13/10 [facility]                    It is very important that we reach you to reschedule this appointment. We look forward to participating in your health care needs. Please contact us at the number listed above at your earliest convenience to reschedule this appointment.     Sincerely,    Glass blower/designer

## 2010-09-19 NOTE — Progress Notes (Signed)
Summary: breast center  Phone Note Call from Patient   Summary of Call: breast center called and stated they needed location. are either upper are outter. [phone] stephanie Initial call taken by: Rudene Anda,  September 12, 2010 9:06 AM  Follow-up for Phone Call        I'm sorry, I don't understand what info is needed Follow-up by: Everitt Amber LPN,  September 12, 2010 9:08 AM  Additional Follow-up for Phone Call Additional follow up Details #1::        pt reports right breast pain x 2 months, feeling a pulling in the outer upper quadrant, no d/c , no mass Additional Follow-up by: Syliva Overman MD,  September 12, 2010 12:08 PM    Additional Follow-up for Phone Call Additional follow up Details #2::    The breast center aware Follow-up by: Everitt Amber LPN,  September 12, 2010 1:11 PM

## 2010-09-19 NOTE — Progress Notes (Signed)
Summary: mammo  Phone Note Call from Patient   Summary of Call: pt called again and wanted to know about a diag. mammo. stated she hasn't heard anything. [phone] Initial call taken by: Rudene Anda,  September 11, 2010 10:01 AM  Follow-up for Phone Call        pain and tenderness in right breast x approx 2 months has not had screening mammo since 2010 Follow-up by: Adella Hare LPN,  September 11, 2010 1:36 PM  Additional Follow-up for Phone Call Additional follow up Details #1::        report is neg, it came today, and shehas a disc to collect  Additional Follow-up by: Syliva Overman MD,  September 11, 2010 3:23 PM    Additional Follow-up for Phone Call Additional follow up Details #2::    last was 2010 per breast center, she is having new problem Follow-up by: Adella Hare LPN,  September 11, 2010 4:32 PM  Additional Follow-up for Phone Call Additional follow up Details #3:: Details for Additional Follow-up Action Taken: pt has appt at breast center for 09/13/2010 2:50 Additional Follow-up by: Rudene Anda,  September 11, 2010 4:52 PM

## 2010-09-25 ENCOUNTER — Other Ambulatory Visit: Payer: Self-pay | Admitting: Family Medicine

## 2010-09-26 ENCOUNTER — Telehealth: Payer: Self-pay | Admitting: Family Medicine

## 2010-09-26 DIAGNOSIS — M542 Cervicalgia: Secondary | ICD-10-CM

## 2010-09-27 ENCOUNTER — Encounter: Payer: Self-pay | Admitting: Family Medicine

## 2010-09-27 DIAGNOSIS — I2699 Other pulmonary embolism without acute cor pulmonale: Secondary | ICD-10-CM

## 2010-09-27 DIAGNOSIS — Z7901 Long term (current) use of anticoagulants: Secondary | ICD-10-CM

## 2010-09-27 NOTE — Telephone Encounter (Signed)
pls see referral

## 2010-09-27 NOTE — Telephone Encounter (Signed)
Let pt know referral to pain management entered pls and schedule appt

## 2010-10-04 ENCOUNTER — Ambulatory Visit (INDEPENDENT_AMBULATORY_CARE_PROVIDER_SITE_OTHER): Payer: Medicare Other | Admitting: *Deleted

## 2010-10-04 DIAGNOSIS — I2699 Other pulmonary embolism without acute cor pulmonale: Secondary | ICD-10-CM

## 2010-10-04 DIAGNOSIS — Z7901 Long term (current) use of anticoagulants: Secondary | ICD-10-CM

## 2010-10-04 LAB — POCT INR: INR: 3.2

## 2010-10-09 LAB — DIFFERENTIAL
Basophils Absolute: 0.1 10*3/uL (ref 0.0–0.1)
Basophils Relative: 1 % (ref 0–1)
Neutro Abs: 3.2 10*3/uL (ref 1.7–7.7)
Neutrophils Relative %: 45 % (ref 43–77)

## 2010-10-09 LAB — BASIC METABOLIC PANEL
CO2: 28 mEq/L (ref 19–32)
Calcium: 9.5 mg/dL (ref 8.4–10.5)
Creatinine, Ser: 0.94 mg/dL (ref 0.4–1.2)
GFR calc Af Amer: >60 mL/min (ref >60)
Glucose, Bld: 110 mg/dL — ABNORMAL HIGH (ref 70–99)

## 2010-10-09 LAB — POCT CARDIAC MARKERS
CKMB, poc: <1 ng/mL — ABNORMAL LOW (ref 1.0–8.0)
Myoglobin, poc: 56 ng/mL (ref 12–200)

## 2010-10-09 LAB — PROTIME-INR: Prothrombin Time: 38.1 seconds — ABNORMAL HIGH (ref 11.6–15.2)

## 2010-10-09 LAB — CBC
MCHC: 35.4 g/dL (ref 30.0–36.0)
RDW: 14.1 % (ref 11.5–15.5)

## 2010-10-11 ENCOUNTER — Other Ambulatory Visit (HOSPITAL_COMMUNITY): Payer: Self-pay | Admitting: Physical Medicine and Rehabilitation

## 2010-10-11 DIAGNOSIS — R29898 Other symptoms and signs involving the musculoskeletal system: Secondary | ICD-10-CM

## 2010-10-11 DIAGNOSIS — R200 Anesthesia of skin: Secondary | ICD-10-CM

## 2010-10-11 DIAGNOSIS — R52 Pain, unspecified: Secondary | ICD-10-CM

## 2010-10-12 ENCOUNTER — Ambulatory Visit (HOSPITAL_COMMUNITY)
Admission: RE | Admit: 2010-10-12 | Discharge: 2010-10-12 | Disposition: A | Discharge status: Home / Self Care | Payer: Medicare Other | Source: Ambulatory Visit | Attending: Physical Medicine and Rehabilitation | Admitting: Physical Medicine and Rehabilitation

## 2010-10-12 DIAGNOSIS — R200 Anesthesia of skin: Secondary | ICD-10-CM

## 2010-10-12 DIAGNOSIS — M545 Low back pain, unspecified: Secondary | ICD-10-CM | POA: Insufficient documentation

## 2010-10-12 DIAGNOSIS — M79609 Pain in unspecified limb: Secondary | ICD-10-CM | POA: Insufficient documentation

## 2010-10-12 DIAGNOSIS — R209 Unspecified disturbances of skin sensation: Secondary | ICD-10-CM | POA: Insufficient documentation

## 2010-10-12 DIAGNOSIS — R29898 Other symptoms and signs involving the musculoskeletal system: Secondary | ICD-10-CM

## 2010-10-12 DIAGNOSIS — M51379 Other intervertebral disc degeneration, lumbosacral region without mention of lumbar back pain or lower extremity pain: Secondary | ICD-10-CM | POA: Insufficient documentation

## 2010-10-12 DIAGNOSIS — M5137 Other intervertebral disc degeneration, lumbosacral region: Secondary | ICD-10-CM | POA: Insufficient documentation

## 2010-10-12 DIAGNOSIS — R52 Pain, unspecified: Secondary | ICD-10-CM

## 2010-10-13 ENCOUNTER — Other Ambulatory Visit (HOSPITAL_COMMUNITY): Payer: Medicare Other

## 2010-10-24 ENCOUNTER — Other Ambulatory Visit: Payer: Self-pay | Admitting: Family Medicine

## 2010-10-24 DIAGNOSIS — E119 Type 2 diabetes mellitus without complications: Secondary | ICD-10-CM

## 2010-11-01 ENCOUNTER — Encounter: Payer: Medicare Other | Admitting: *Deleted

## 2010-11-08 ENCOUNTER — Ambulatory Visit (INDEPENDENT_AMBULATORY_CARE_PROVIDER_SITE_OTHER): Payer: Medicare Other | Admitting: *Deleted

## 2010-11-08 DIAGNOSIS — I2699 Other pulmonary embolism without acute cor pulmonale: Secondary | ICD-10-CM

## 2010-11-08 DIAGNOSIS — Z7901 Long term (current) use of anticoagulants: Secondary | ICD-10-CM

## 2010-11-14 NOTE — Discharge Summary (Signed)
Stephanie Singleton, Stephanie Singleton          ACCOUNT NO.:  [id number]   MEDICAL RECORD NO.:  [id number]          PATIENT TYPE:  OBV   LOCATION:  A307                          FACILITY:  [facility]   PHYSICIAN:  Monte Fantasia, MD  DATE OF BIRTH:  [birth year]   DATE OF ADMISSION:  04/22/2008  DATE OF DISCHARGE:  10/23/2009LH                               DISCHARGE SUMMARY   A 65 year old lady patient came in on April 22, 2008 with a complaint  of local numbness on her left side of the face and her left upper  extremity.  The patient was admitted for observation to rule out stroke  or a possible TIA.  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  MRI of the brain was  done without contrast and showed chronic microvascular ischemia with no  acute infarct.  The patient had no progression of the symptoms and had  no focal neurological deficits.  Denies any complaints of focal  neurological deficits at present.  Denies any distress or discomfort.  The patient examined by the bedside.   PHYSICAL EXAMINATION:  VITALS:  Temperature of 97.7, heart rate of 51,  respirations 16, and blood pressure of 134/88.  HEENT:  No pallor. No orofacial weakness.  Pupils equal, reacting to  light.  NECK:  Supple.  No lymphadenopathy.  No JVD.  RESPIRATORY SYSTEM:  Air entry is bilaterally equal.  No rales.  No  rhonchi.  ABDOMEN:  Soft.  Obesity 1+.  No palpable mass.  No organomegaly.  EXTREMITIES:  No edema.  CNS:  Alert, awake, and oriented x3.  Power 5/5 in all extremities.  Deep tendon reflexes 2+ ankle, knee, and biceps and both upper and lower  extremities.  No neurosensory deficits.   LABORATORY DATA:  Total WBC count of 5.2, H&H of 12.40 and 35.7, and  platelet count of 289.  Prothrombin time is 41.2, INR is 3.9; on  admission, the INR was 3.7, Coumadin was held over 24 hours.  Sodium  137, potassium 3.7, chloride 104, bicarb is 24, glucose 121, BUN is  13,  and creatinine is 0.8.   ASSESSMENT AND PLAN:  1. To rule out stroke or possible transient ischemic attack.  2. 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  3. Hypertension.  4. Chronic pain.  5. History of Pulmonary embolism   The patient had no progression of his symptoms over night.  The patient  reevaluated by Neurology in the morning.  MRI of the brain showed  chronic microvascular ischemia with no acute infarct.  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  Neurology recommended to do ESR,  angiotensin-converting enzyme, RPR, homocysteine levels, TSH, and to  follow up in 4 weeks for EMG, also recommended ENT evaluation for  parotid gland.   INR was high on admission about 3.7.  Repeat INR today was 3.9.  The  Coumadin was held for the night.  The home care nurse arranged to have  a  repeat INRs done in the morning and to inform the primary care regarding  the same to adjust the Coumadin dose.  Deep vein thrombosis and  gastrointestinal prophylaxis given.   MEDICATIONS ON DISCHARGE:  1. To continue lisinopril.  2. Hydrochlorothiazide 20/25 mg 1 tablet p.o. daily.  3. Prevacid 30 mg p.o. twice daily.  4. Coumadin 5 mg p.o., to hold for today, and we will repeat the INR      in the morning and adjust the dose accordingly.      Monte Fantasia, MD  Electronically Signed     MP/MEDQ  D:  04/23/2008  T:  04/24/2008  Job:  [id number]   cc:   Milus Mallick. Lodema Hong, M.D.  Fax: [phone]

## 2010-11-14 NOTE — Consult Note (Signed)
NAMECRESCENT, Stephanie Singleton          ACCOUNT NO.:  [id number]   MEDICAL RECORD NO.:  [id number]          PATIENT TYPE:  OBV   LOCATION:  A307                          FACILITY:  [facility]   PHYSICIAN:  Kofi A. Gerilyn Pilgrim, M.D. DATE OF BIRTH:  1945-10-29   DATE OF CONSULTATION:  DATE OF DISCHARGE:  04/23/2008                                 CONSULTATION   REASON FOR CONSULTATION:  Left facial and upper extremity numbness.   The patient is a 65 year old black female who reports developing  relatively acute onset of swelling involving the right facial region  essentially around the parotid area.  She essentially woke up like this  with right facial swelling.  She also reports numbness involving the  left half of the tongue.  She reports some dysesthesia with a pulling-  like sensation involving the ear and the posterior auricular region.  She then went on to have numbness of the left hand and forearm and some  ataxia involving the left leg.  She reports having some improvement but  she still has numbness involving the left hand and dysesthesia involving  the left facial region.  She believes the swelling has gone down on the  left side.  She apparently has a history of what appears to be a spinal  tumor but not quite sure if this is extraaxial or not.  She is being  followed by Dr. Freddrick March.  The patient was seen by [place] many years ago and  then she actually immediately recognized me, although I have no  recollection of her case, apparently we saw her for some type of  paresthesias, numbness, and also sleep apnea.   PAST MEDICAL HISTORY:  1. Substance dependence syndrome.  2. Chronic fatigue syndrome.  3. Gastroesophageal reflux disease.  4. Hypertension.  5. Pulmonary embolism occurred 4-5 years ago.   ADMISSION MEDICATIONS:  Coumadin, Prevacid, lisinopril, Darvocet,  Tylenol, and calcium.   PAST SURGICAL HISTORY:  Insertion of Port-A-Cath.   SOCIAL HISTORY:  No alcohol use.  No tobacco  use.  No illicit drug use.   ALLERGIES:  Apparently ASPIRIN causes gastric irritation.   REVIEW OF SYSTEMS:  The patient initially reports that she has seen Dr.  Kevan Ny, ENT doctor because of swelling of her vocal cords.  She  apparently saw him recently and apparently has gotten scoped.  She is  supposed to ask him for further evaluation by him.  He is an ENT  physician in the [place] area.   PHYSICAL EXAMINATION:  GENERAL:  An overweight pleasant lady in no acute  distress.  Temperature 97.9, pulse 57, respirations 20, and blood  pressure 112/59.  HEENT:  She clearly has asymmetric parotid gland with the left being  larger than the right.  NECK:  Supple.  She does have significant crowding posterior airspace in  both the AP and lateral dimensions.  ABDOMEN:  Soft.  EXTREMITIES:  No significant edema.  MENTATION:  The patient is awake and alert.  She converses well.  Speech, language, and cognition are intact.  CRANIAL NERVE EVALUATION:  Pupils are equal, round, and reactive to  light and  accommodation.  Extraocular movements are full.  Visual fields  are intact.  Facial muscle strength is symmetric.  Tongue in midline.  The uvula is also midline.  She does not really have facial numbness,  more of dysesthesia on the left side.  MOTOR EXAMINATION:  Normal tone, bulk, and strength.  There is no  pronator drift.  Coordination is intact.  Reflexes are symmetric.  Plantar reflexes were both downgoing.  Coordination shows no dysmetrias  or tremors.   Head CT scan of the brain apparently shows old parietal infarct,  however, MRI scan does not really corroborate this.  She actually has  bilateral white matter changes which is not usual for her age.  On the  special T2 sequence looking for the blood products, she has a  hyperintensity involving the right pontine area consistent with old  broken down blood products.  Nothing acute is seen on this fusion  imaging.   LABORATORY  EVALUATION:  WBC 6.1, hemoglobin 12.9, and platelet count of  296.  Normal differential.  Sodium 137, potassium 3.3, chloride 106, CO2  of 27, glucose 124, BUN 11, creatinine 0.7, calcium 9.  INR 3.7.  EKG,  normal sinus with left ventricular hypertrophy.   ASSESSMENT:  Essentially nonlocalizing symptoms with left facial  numbness/dysesthesia of the left upper extremity.  1. Numbness/dysesthesia.  I think her main problem is probably is the      asymmetric swelling of the left parotid gland.  The symptoms      involving the left upper extremity could be due to referred pain.  2. 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  This is of doubtful clinical      significance at this time.  3. 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   RECOMMENDATIONS:  1. We are going to do some additional blood testing for assessing      angiotensin-converting enzyme, sed rate, RPR, thyroid function      test, and vitamin B12.  2. I think she needs to follow up with her ENT for further evaluation      of the parotid gland.  3. Nerve conduction study, EMG which can be done in an outpatient      setting.   Thanks for this consultation.      Kofi A. Gerilyn Pilgrim, M.D.  Electronically Signed     KAD/MEDQ  D:  04/23/2008  T:  04/24/2008  Job:  [id number]

## 2010-11-14 NOTE — H&P (Signed)
Stephanie Singleton, Stephanie Singleton          ACCOUNT NO.:  [id number]   MEDICAL RECORD NO.:  [id number]          PATIENT TYPE:  INP   LOCATION:  A307                          FACILITY:  [facility]   PHYSICIAN:  Monte Fantasia, MD  DATE OF BIRTH:  05-Feb-1946   DATE OF ADMISSION:  04/22/2008  DATE OF DISCHARGE:  LH                              HISTORY & PHYSICAL   HISTORY OF PRESENT ILLNESS:  A 65 year old lady patient came in with  chief complaint of left-sided facial numbness and subsided upper arm  numbness since the last 10 hours.  It started first with the facial  numbness today early in the morning at around 1 a.m. and then she woke  up having left upper extremity numbness.  No complaints of loss of gait.  No history of falls.  No complaints of any focal neurological  weaknesses.  No complaints of chest pain or shortness of breath.  No  complaints of difficulty in speaking.  No complaints of difficulty in  swallowing or difficulty in walking.  No history of similar episodes in  the past.  No complaints of diaphoresis or loss of consciousness.   ALLERGIES:  The patient is allergic to ASPIRIN.   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   PAST SURGICAL HISTORY:  None.   SOCIAL HISTORY:  The patient has been a nonsmoker.  No history of  alcohol use.  No history of IV drug use.   MEDICATIONS:  The patient is on Coumadin, calcium, Darvocet, lisinopril,  Prevacid, and Tylenol.   IMMUNIZATIONS:  Not known.   FAMILY HISTORY:  Not significant.   PHYSICAL EXAMINATION:  GENERAL:  The patient is comfortable, sitting up,  not in any respiratory distress.  VITAL SIGNS:  Temperature of 97.8, pulse of 53, respirations of 18, and  blood pressure of 129/85.  HEENT:  Pupils equal and reacting to light.  No facial droop.  No  pallor.  No icterus.  No  lymphadenopathy.  No JVD.  RESPIRATORY SYSTEM:  Air entry is bilaterally equal.  Normal vesicular  breath sounds.  No rales.  No rhonchi.  ABDOMEN:  Soft.  Obesity plus.  Bowel sounds plus.  No guarding, no  rigidity, and no distention.  CVS:  S1 and S2 plus.  No murmurs or gallops.  EXTREMITIES:  No edema.  Deep tendon reflexes are 2+ in the ankles and  triceps bilaterally.  CRANIAL SYSTEM:  Cranial nerves II-XII intact.  Pupils equal and  reacting to light.  Power 5/5 in all extremities.  SENSORY:  Sensation is intact.   LABORATORY DATA:  WBC 6.1, hemoglobin and hematocrit 12.9/37, and  platelets 296.  Coagulation; PT is 40.2, INR is 3.7, and PTT is 56.  Routine sodium is 137, potassium 3.3, chloride 106, bicarb 27, glucose  124, BUN 11, creatinine 0.77, and calcium of 9.  Radiological images;  chest x-ray, no active lung disease; CT examination, CT of the head  without contrast, no sign of acute or reversible  process, old white  matter stroke in right parietal area.   ASSESSMENT AND PLAN:  1. Transient ischemic attack, to rule out stroke.  2. Hypertension.  3. History of pulmonary embolism.  4. Chronic pain.      Monte Fantasia, MD  Electronically Signed     MP/MEDQ  D:  04/22/2008  T:  04/23/2008  Job:  [id number]

## 2010-11-16 ENCOUNTER — Encounter: Payer: Self-pay | Admitting: Family Medicine

## 2010-11-17 NOTE — Procedures (Signed)
   Stephanie Singleton, Stephanie Singleton                      ACCOUNT NO.:  [id number]   MEDICAL RECORD NO.:  [id number]                   PATIENT TYPE:  OUT   LOCATION:  RAD                                  FACILITY:  [facility]   PHYSICIAN:  Gerrit Friends. Dietrich Pates, M.D. [facility]        DATE OF BIRTH:  1946/04/21   DATE OF PROCEDURE:  02/17/2002  DATE OF DISCHARGE:                                  ECHOCARDIOGRAM   REFERRING PHYSICIAN:  1. Dr. Lodema Hong.  2. Jesse Sans. Wall, M.D.   CLINICAL DATA:  A 65 year old woman with hypertension, weakness, and  congestive heart failure.   1. Technically adequate echocardiographic study.  2. Normal left atrium, right atrium, and right ventricle.  3. Mild focal calcification of the mitral valve with mild annular     calcification.  4. Normal aortic valve.  5. Normal tricuspid valve.  6. Normal internal dimension of the left ventricle; mild to moderate     hypertrophy with predominance in the proximal septum.  Normal regional     and global LV systolic function.  7. Comparison with prior study of May 06, 1998:  No significant interval     change.                                                 Gerrit Friends. Dietrich Pates, M.D. [facility]    RMR/MEDQ  D:  02/17/2002  T:  02/18/2002  Job:  [id number]

## 2010-11-17 NOTE — Procedures (Signed)
   NAMEELLEY, HARP                      ACCOUNT NO.:  [id number]   MEDICAL RECORD NO.:  [id number]                   PATIENT TYPE:  INP   LOCATION:  A201                                 FACILITY:  [facility]   PHYSICIAN:  Edward L. Juanetta Gosling, M.D.             DATE OF BIRTH:  02-Apr-1946   DATE OF PROCEDURE:  DATE OF DISCHARGE:                                EKG INTERPRETATION   TIME/DATE:  1610 on  December 04, 2002   Originally sinus rhythm, rate 70.  There is questionable left ventricular  hypertrophy.  There are ST changes, which may be related to LVH; it could be  related to ischemia.  Small Q waves are seen inferiorly, and clinical  correlation suggests that these could represent previous inferior myocardial  infarction, but ________ suggests it.  Abnormal electrocardiogram.                                               Oneal Deputy. Juanetta Gosling, M.D.    ELH/MEDQ  D:  12/09/2002  T:  12/10/2002  Job:  [id number]

## 2010-11-17 NOTE — H&P (Signed)
NAMEJERNEY, Stephanie Singleton                      ACCOUNT NO.:  [id number]   MEDICAL RECORD NO.:  [id number]                   PATIENT TYPE:  INP   LOCATION:  A224                                 FACILITY:  [facility]   PHYSICIAN:  Hanley Hays. Dechurch, M.D.           DATE OF BIRTH:  08/31/1945   DATE OF ADMISSION:  12/06/2002  DATE OF DISCHARGE:                                HISTORY & PHYSICAL   HISTORY OF PRESENT ILLNESS:  This 65 year old African-American female is  followed by Dr. Lodema Hong with a past medical history unremarkable for chronic  fatigue syndrome, narcolepsy, hypertension, reflux, and is essentially  sedentary.  He noted increased shortness of breath on 12/05/2002 and  progressive right anterior chest pain.  She summoned EMS who brought her to  the emergency room where, though her O2 saturation was 100%, moderately  elevated D-dimer; a CT revealed positive multiple PEs in both lungs though  no DVT on CT scan.  She has no previous history of pulmonary embolism.  No  recent surgical procedures.  No changes in her activity though she is obese  and very sedentary staying in bed most of the day.  She has had no recent  travel experiences.  She has a history of 10 pregnancies with no history of  miscarriage, no phlebitis or DVT associated with her pregnancies.  No family  history of sudden death, PE, or phlebitis.  Her room air blood gas revealed  a pH of 7.49, pCO2 of 31, and pO2 of 151.  The patient is a very, very,  difficult IV access candidate.   MEDICATIONS:  Include Zestoretic daily, Protonix daily, Advil, and Aleve  p.r.n.   PAST MEDICAL HISTORY:  Chronic fatigue syndrome, apparently diagnosed as  exclusion obstructive sleep apnea (diagnosed by sleep study); narcolepsy per  neurology; obesity; hypertension; chronic back and neck pain.  Gravida 10,  para 13, AB 0.  She had toxemia associated with 1 pregnancy.  No surgical  history.  She has been menopausal since age 19. No  estrogen replacement  therapy.   ALLERGIES:  Aspirin which causes a GI intolerance.   SOCIAL HISTORY:  No tobacco or alcohol abuse.  She uses no other  medications.  She is very active in her church.  She is divorced with 13  children, 5 still at home.   FAMILY MEDICAL HISTORY:  Negative for PE, DVT, coagulase disorder, sudden  death.   REVIEW OF SYSTEMS:  Again, very difficult venous access.  She complains of  gastroesophageal reflux despite using her Protonix.  No nausea or vomiting  at this time.  No bowel complaints.  Chronic shortness of breath with  exertion.  No change up until the day of admission.  Uses her BiPAP  occasionally, but notes no real change with it.  Limited mobility.  No GU  complaints.  No extremity pain, no trauma, no falls.  Essentially negative  review of systems.  PHYSICAL EXAMINATION:  GENERAL:  Physical exam reveals an obese, well-  developed, well-nourished female who is somewhat lethargic but arousable and  is appropriate.  Denies any complaints at this time.  She states that she  normally sleeps for several hours in the morning and is up all night.  Is  currently morning time.  NECK:  Supple.  No JVD, adenopathy, thyromegaly.  HEENT:  Oral mucosa is moist.  No thrush.  LUNGS:  Clear bilaterally anteriorly and posteriorly.  No rales or rhonchi  are noted.  No rub is present at this time.  HEART:  Regular.  No gallop was present.  ABDOMEN:  Obese, soft, nontender.  BREASTS:  Breasts are pendulous, no masses noted.  EXTREMITIES:  Without clubbing or cyanosis.  No edema is present.  There is  no bruising, no evidence of trauma.  NEUROLOGIC:  She answers appropriately.  Memory is intact.  Nonfocal exam.  Gait is intact.  The patient is able to move about on the bed.   ASSESSMENT AND PLAN:  1. Pulmonary embolism on CT scan with curious ABG.  No obvious complaints,     active though she is obese and quite sedentary.  Given her age and     pregnancy  history.  She is unlikely to have a factor deficiency, a Leiden     mutation, or antithrombin 3 lupus anticoagulant, etc.  We are going to     treat her with Lovenox 10 mg/kg subcu, q.12h. secondary to a poor IV     access, and begin Coumadin pain management as needed.  The patient states     that she does not take any medications at home, and tries to avoid     anything narcotic.  She has received some __________ which seems to have     helped.  2. Chronic fatigue syndrome.  3. Obstructive sleep apnea and history of narcolepsy previously with taking     NoDoz maybe 2 a day, she stated.  4. Hypertension.  Continue her Zestoretic and monitor.  5. __________ esophageal reflux; again will continue.   I am sure that this lady has had thyroid studies, etc. done to evaluate her  chronic fatigue, although it would be reasonable to check another set at  this point.  The plan and expectations of pulmonary embolism were discussed  with the patient who seems to have somewhat reasonable understanding.                                               Hanley Hays Josefine Class, M.D.    FED/MEDQ  D:  12/06/2002  T:  12/06/2002  Job:  [id number]

## 2010-11-17 NOTE — Consult Note (Signed)
   NAMEJAZZALYN, Stephanie Singleton NO.:  [id number]   MEDICAL RECORD NO.:  [id number]                   PATIENT TYPE:  INP   LOCATION:  IC09                                 FACILITY:  [facility]   PHYSICIAN:  Barbaraann Barthel, M.D.              DATE OF BIRTH:  04-24-1946   DATE OF CONSULTATION:  12/07/2002  DATE OF DISCHARGE:                                   CONSULTATION   SURGICAL CONSULT:   NOTE:  Surgery was asked to see this 65 year old black female who has no  venous access who has diagnosis of pulmonary embolism.  Central venous  pressure line was requested on an emergency basis.  This was discussed with  the family.  I discussed complications, not limited to, but including  bleeding, infection,  pneumothorax, and catheter embolism with the patient;  and informed consent was obtained.   TECHNIQUE:  The patient was placed in the Trendelenburg position.  Her right  hemithorax was prepped with Betadine solution and draped in the usual  manner.  Using 1% Xylocaine without epinephrine an area below her right  clavicle was anesthetized and then using an 18 gauge needle the subclavian  vein was then cannulated and a guidewire placed through it and then the  needle was removed; and over the guidewire the triple lumen catheter, which  had bee previously flushed with sterile Lidocaine solution, was placed over  the guidewire and confirmed in position with the easy withdrawal of venous  blood.  We then connected this to the IV line; and then sutured the line in  place.  We obtained a chest x-ray which showed good position and no  pneumothorax. This line was then sutured in place with 3-0 monofilament  suture and a sterile OpSite dressing with Betadine ointment was applied.   Prior to closure all sponge, needle, and instrument counts were accounted  for.  There were no complications.  The patient tolerated the procedure very  well; and the above was explained to the  family.                                               Barbaraann Barthel, M.D.    WB/MEDQ  D:  12/07/2002  T:  12/07/2002  Job:  [id number]   cc:   Hanley Hays. Dechurch, M.D.  [address]  [place]  [place] [address]  Fax: [phone]

## 2010-11-17 NOTE — Discharge Summary (Signed)
Stephanie Singleton, Stephanie Singleton                      ACCOUNT NO.:  [id number]   MEDICAL RECORD NO.:  [id number]                   PATIENT TYPE:  INP   LOCATION:  A201                                 FACILITY:  [facility]   PHYSICIAN:  Sarita Bottom, M.D.                  DATE OF BIRTH:  1946-05-11   DATE OF ADMISSION:  12/06/2002  DATE OF DISCHARGE:                                 DISCHARGE SUMMARY   DISCHARGE DIAGNOSES:  1. Bilateral pulmonary embolism.  2. Iron deficiency anemia.   OTHER DIAGNOSES:  1. Chronic fatigue syndrome.  2. Obstructive sleep apnea.  3. High blood pressure.   DISCHARGE MEDICATIONS:  1. Coumadin 5 mg daily  2. Patient to continue with other preadmission medications.     A. Prinivil 20 mg daily.     B. Protonix 40 daily.     C. Hydrochlorothiazide 25 mg daily.     D. Advil p.r.n.     E. Aleve p.r.n.   FOLLOWUP:  Colonoscopy as an outpatient to rule out any occult GI bleed.  Monitor patient's INR on Coumadin 5 mg daily q.weekly.   HISTORY OF PRESENT ILLNESS:  Stephanie Singleton is a 65 year old African  American female with history of high blood pressure who was admitted to  [facility] on December 06, 2002, after she presented with complaint of  arterial chest wall pain and shortness of breath.   PHYSICAL EXAMINATION:  GENERAL APPEARANCE:  An obese lady in moderate  respiratory distress.  However, oxygen saturation was in the 100s.  CHEST:  Clear to auscultation.  ABDOMEN:  Benign.  CNS:  Alert and oriented x3.   LABORATORY DATA:  D. dimer +2.  Spiral CT of the chest revealed bilateral  pulmonary embolism in the right lingular and right middle lobe and bilateral  lower lobes.  She also had some right lower lung atelectasis and air space  disease.  There was no evidence of any deep vein thrombosis in her  extremities.   IMPRESSION:  Pulmonary embolism.   HOSPITAL COURSE:  She was started on Lovenox and later put on Coumadin.  She  also received a  subclavian line on this admission for IV access and  hydration.   CLINICAL COURSE:  Significant for complaints of bilateral chest pain for  which the patient received several doses of narcotic pain medication.  The  patient has been seen on rounds today.  She has no new complaints.  Her  vital signs are stable.  Her chest is clear.  Heart sounds one and two are  normal.  Rhythm is regular.  No murmurs were heard.  Her latest lab shows a  PT of 22.2, INR 2.2.  Serum iron is reduced at 28, RBC is 196, ferritin  level is 1258.  WBC 10, hemoglobin 10.9, MCV 77.8, hematocrit 30.2, platelet  count is 322,000.  The patient is stable for discharge, and she will  be  discharged home today to follow up with primary doctor Milus Mallick. Lodema Hong,  M.D.  The patient will also be referred to GI clinic for outpatient  colonoscopy.   CONDITION ON DISCHARGE:  Stable and satisfactory.                                               Sarita Bottom, M.D.    DW/MEDQ  D:  12/11/2002  T:  12/11/2002  Job:  [id number]   cc:   Milus Mallick. Lodema Hong, M.D.  [address]  [place], [place] [address]  Fax: [phone]

## 2010-11-22 ENCOUNTER — Ambulatory Visit: Payer: Self-pay | Admitting: Family Medicine

## 2010-11-22 ENCOUNTER — Encounter: Payer: Self-pay | Admitting: Family Medicine

## 2010-11-25 ENCOUNTER — Other Ambulatory Visit: Payer: Self-pay | Admitting: Family Medicine

## 2010-12-06 ENCOUNTER — Encounter: Payer: Medicare Other | Admitting: *Deleted

## 2010-12-16 ENCOUNTER — Other Ambulatory Visit: Payer: Self-pay | Admitting: Family Medicine

## 2011-01-07 ENCOUNTER — Other Ambulatory Visit: Payer: Self-pay | Admitting: Family Medicine

## 2011-01-16 ENCOUNTER — Emergency Department (HOSPITAL_COMMUNITY): Payer: Medicare Other

## 2011-01-16 ENCOUNTER — Other Ambulatory Visit: Payer: Self-pay

## 2011-01-16 ENCOUNTER — Encounter: Payer: Self-pay | Admitting: Emergency Medicine

## 2011-01-16 ENCOUNTER — Emergency Department (HOSPITAL_COMMUNITY)
Admission: EM | Admit: 2011-01-16 | Discharge: 2011-01-17 | Disposition: A | Discharge status: Home / Self Care | Payer: Medicare Other | Attending: Emergency Medicine | Admitting: Emergency Medicine

## 2011-01-16 DIAGNOSIS — Z87891 Personal history of nicotine dependence: Secondary | ICD-10-CM | POA: Insufficient documentation

## 2011-01-16 DIAGNOSIS — R079 Chest pain, unspecified: Secondary | ICD-10-CM | POA: Insufficient documentation

## 2011-01-16 DIAGNOSIS — R5381 Other malaise: Secondary | ICD-10-CM | POA: Insufficient documentation

## 2011-01-16 DIAGNOSIS — R209 Unspecified disturbances of skin sensation: Secondary | ICD-10-CM | POA: Insufficient documentation

## 2011-01-16 HISTORY — DX: Gastro-esophageal reflux disease without esophagitis: K21.9

## 2011-01-16 HISTORY — DX: Neoplasm of unspecified behavior of bone, soft tissue, and skin: D49.2

## 2011-01-16 HISTORY — DX: Essential (primary) hypertension: I10

## 2011-01-16 LAB — DIFFERENTIAL
Eosinophils Relative: 3 % (ref 0–5)
Lymphocytes Relative: 43 % (ref 12–46)
Lymphs Abs: 2.9 10*3/uL (ref 0.7–4.0)
Monocytes Absolute: 0.7 10*3/uL (ref 0.1–1.0)

## 2011-01-16 LAB — CARDIAC PANEL(CRET KIN+CKTOT+MB+TROPI)
Relative Index: INVALID (ref 0.0–2.5)
Total CK: 68 U/L (ref 7–177)

## 2011-01-16 LAB — CBC
HCT: 36.3 % (ref 36.0–46.0)
MCV: 77.6 fL — ABNORMAL LOW (ref 78.0–100.0)
Platelets: 317 10*3/uL (ref 150–400)
RBC: 4.68 MIL/uL (ref 3.87–5.11)
WBC: 6.8 10*3/uL (ref 4.0–10.5)

## 2011-01-16 NOTE — ED Notes (Signed)
Patient c/o right sided chest pain x several months and states right arm has weakness x several weeks.

## 2011-01-16 NOTE — ED Provider Notes (Addendum)
History     Chief Complaint  Patient presents with  . Chest Pain  . Numbness   Patient is a 65 y.o. female presenting with chest pain. The history is provided by the patient.  Chest Pain The chest pain began more  than 1 month ago. Chest pain occurs intermittently. The chest pain is worsening (more often over last few days and constant today). The severity of the pain is moderate. The quality of the pain is described as stabbing (stabbing when pain at its worst, otherwise a pulling pain). The pain does not radiate (c/o weakness/numbness in his RUE). Primary symptoms include shortness of breath and palpitations. Pertinent negatives for primary symptoms include no fever, no cough, no nausea, no vomiting and no dizziness.  The shortness of breath developed with exertion.  The palpitations also occurred with shortness of breath. The palpitations did not occur with dizziness.   Associated symptoms include numbness and weakness.  Pertinent negatives for associated symptoms include no diaphoresis and no lower extremity edema. Treatments tried: tylenol with some relief.  Her past medical history is significant for diabetes and hypertension. 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 Diabetes mellitus   . Hypertension   . GERD (gastroesophageal reflux disease)   . Tumor of soft tissue of neck     History reviewed. No pertinent past surgical history.  No family history on file.  History  Substance Use Topics  . Smoking status: Former Games developer  . Smokeless tobacco: Not on file  . Alcohol Use: No    OB History    Grav Para Term Preterm Abortions TAB SAB Ect Mult Living                  Review of Systems  Constitutional: Negative for fever and diaphoresis.  Respiratory: Positive for shortness of breath. Negative for cough.   Cardiovascular: Positive for chest pain and palpitations.  Gastrointestinal: Negative for nausea and  vomiting.  Neurological: Positive for weakness and numbness. Negative for dizziness.  All other systems reviewed and are negative.    Physical Exam  BP 153/75  Pulse 92  Temp(Src) 98.1 F (36.7 C) (Oral)  Resp 18  Ht 5\' 6"  (1.676 m)  Wt 230 lb (104.327 kg)  BMI 37.12 kg/m2  SpO2 100%  Physical Exam  Nursing note and vitals reviewed. Constitutional: She is oriented to person, place, and time. No distress.       Appearance consistent with age of record  HENT:  Head: Normocephalic and atraumatic.  Right Ear: External ear normal.  Left Ear: External ear normal.  Nose: Nose normal.  Mouth/Throat: Oropharynx is clear and moist.  Eyes: Conjunctivae are normal.  Neck: Neck supple.  Cardiovascular: Normal rate and regular rhythm.  Exam reveals no gallop and no friction rub.   No murmur heard. Pulmonary/Chest: Effort normal and breath sounds normal. She has no wheezes. She has no rhonchi. She has no rales. She exhibits no tenderness.  Abdominal: Soft. There is no tenderness.  Musculoskeletal: Normal range of motion.       Normal appearance of extremities  Neurological: She is alert and oriented to person, place, and time. No sensory deficit.  Skin: No rash noted.       Color normal  Psychiatric: She has a normal mood and affect.    ED Course  Procedures  MDM Results for orders placed during the hospital encounter of 01/16/11  CBC      Component Value Range   WBC 6.8  4.0 - 10.5 (K/uL)   RBC 4.68  3.87 - 5.11 (MIL/uL)   Hemoglobin 12.6  12.0 - 15.0 (g/dL)   HCT 46.9  62.9 - 52.8 (%)   MCV 77.6 (*) 78.0 - 100.0 (fL)   MCH 26.9  26.0 - 34.0 (pg)   MCHC 34.7  30.0 - 36.0 (g/dL)   RDW 41.3  24.4 - 01.0 (%)   Platelets 317  150 - 400 (K/uL)  DIFFERENTIAL      Component Value Range   Neutrophils Relative 43  43 - 77 (%)   Neutro Abs 3.0  1.7 - 7.7 (K/uL)   Lymphocytes Relative 43  12 - 46 (%)   Lymphs Abs 2.9  0.7 - 4.0 (K/uL)   Monocytes Relative 10  3 - 12 (%)    Monocytes Absolute 0.7  0.1 - 1.0 (K/uL)   Eosinophils Relative 3  0 - 5 (%)   Eosinophils Absolute 0.2  0.0 - 0.7 (K/uL)   Basophils Relative 1  0 - 1 (%)   Basophils Absolute 0.0  0.0 - 0.1 (K/uL)   Dg Chest Portable 1 View  01/16/2011  *RADIOLOGY REPORT*  Clinical Data: 65 year old female with chest pain.  PORTABLE CHEST - 1 VIEW  Comparison: 02/22/2010 and earlier.  Findings: Portable semi upright AP view 2233 hours.  A similar lung volumes.  Cardiac size and mediastinal contours are within normal limits.  No pneumothorax.  Unchanged vague increased density at the bases, probably soft tissue attenuation artifact.  Allowing for portable technique, the lungs are clear.  IMPRESSION: No acute cardiopulmonary abnormality.  Original Report Authenticated By: Harley Hallmark, M.D.      Date: 01/16/2011  Rate: 84  Rhythm: normal sinus rhythm  QRS Axis: normal  Intervals: normal  ST/T Wave abnormalities: normal  Conduction Disutrbances:none  Narrative Interpretation:   Old EKG Reviewed: none available PAC's  Donnetta Hutching, MD 01/16/11 2304  Results for orders placed during the hospital encounter of 01/16/11  CBC      Component Value Range   WBC 6.8  4.0 - 10.5 (K/uL)   RBC 4.68  3.87 - 5.11 (MIL/uL)   Hemoglobin 12.6  12.0 - 15.0 (g/dL)   HCT 27.2  53.6 - 64.4 (%)   MCV 77.6 (*) 78.0 - 100.0 (fL)   MCH 26.9  26.0 - 34.0 (pg)   MCHC 34.7  30.0 - 36.0 (g/dL)   RDW 03.4  74.2 - 59.5 (%)   Platelets 317  150 - 400 (K/uL)  DIFFERENTIAL      Component Value Range   Neutrophils Relative 43  43 - 77 (%)   Neutro Abs 3.0  1.7 - 7.7 (K/uL)   Lymphocytes Relative 43  12 - 46 (%)   Lymphs Abs 2.9  0.7 - 4.0 (K/uL)   Monocytes Relative 10  3 - 12 (%)   Monocytes Absolute 0.7  0.1 - 1.0 (K/uL)   Eosinophils Relative 3  0 - 5 (%)   Eosinophils Absolute 0.2  0.0 - 0.7 (K/uL)   Basophils Relative 1  0 - 1 (%)   Basophils Absolute 0.0  0.0 - 0.1 (K/uL)  CARDIAC PANEL(CRET KIN+CKTOT+MB+TROPI)       Component Value Range   Total CK 68  7 - 177 (U/L)   CK, MB 1.0  0.3 - 4.0 (ng/mL)   Troponin I <0.30  <0.30 (ng/mL)   Relative Index  RELATIVE INDEX IS INVALID  0.0 - 2.5     Donnetta Hutching, MD 02/05/11 1531

## 2011-01-17 MED ORDER — ONDANSETRON HCL 4 MG PO TABS: 4.0000 mg | ORAL_TABLET | Freq: Four times a day (QID) | ORAL | Status: AC

## 2011-01-17 MED ORDER — HYDROCODONE-ACETAMINOPHEN 5-325 MG PO TABS: 1.0000 | ORAL_TABLET | ORAL | Status: AC | PRN

## 2011-01-18 ENCOUNTER — Encounter: Payer: Self-pay | Admitting: Family Medicine

## 2011-01-18 ENCOUNTER — Ambulatory Visit (INDEPENDENT_AMBULATORY_CARE_PROVIDER_SITE_OTHER): Payer: Medicare Other | Admitting: *Deleted

## 2011-01-18 ENCOUNTER — Ambulatory Visit (INDEPENDENT_AMBULATORY_CARE_PROVIDER_SITE_OTHER): Payer: Medicare Other | Admitting: Family Medicine

## 2011-01-18 ENCOUNTER — Other Ambulatory Visit: Payer: Self-pay | Admitting: Family Medicine

## 2011-01-18 VITALS — BP 136/84 | HR 76 | Ht 65.5 in | Wt 229.1 lb

## 2011-01-18 DIAGNOSIS — I2699 Other pulmonary embolism without acute cor pulmonale: Secondary | ICD-10-CM

## 2011-01-18 DIAGNOSIS — E785 Hyperlipidemia, unspecified: Secondary | ICD-10-CM

## 2011-01-18 DIAGNOSIS — E119 Type 2 diabetes mellitus without complications: Secondary | ICD-10-CM

## 2011-01-18 DIAGNOSIS — M546 Pain in thoracic spine: Secondary | ICD-10-CM

## 2011-01-18 DIAGNOSIS — Z7901 Long term (current) use of anticoagulants: Secondary | ICD-10-CM

## 2011-01-18 DIAGNOSIS — I10 Essential (primary) hypertension: Secondary | ICD-10-CM

## 2011-01-18 DIAGNOSIS — R0989 Other specified symptoms and signs involving the circulatory and respiratory systems: Secondary | ICD-10-CM

## 2011-01-18 NOTE — Patient Instructions (Addendum)
F/U in 4 months   It is important that you exercise regularly at least 30 minutes 5 times a week. If you develop chest pain, have severe difficulty breathing, or feel very tired, stop exercising immediately and seek medical attention    A healthy diet is rich in fruit, vegetables and whole grains. Poultry fish, nuts and beans are a healthy choice for protein rather then red meat. A low sodium diet and drinking 64 ounces of water daily is generally recommended. Oils and sweet should be limited. Carbohydrates especially for those who are diabetic or overweight, should be limited to 34-45 gram per meal. It is important to eat on a regular schedule, at least 3 times daily. Snacks should be primarily fruits, vegetables or nuts.   Fasting lipid , CMP and EGFR, HBA1C, lipid, tSH  You will be referred for an MRI of your mid back and aslo an US of the arteries in your neck  Pls start using your CPAP machine every night , this will protect your heart and lungs

## 2011-01-19 ENCOUNTER — Other Ambulatory Visit: Payer: Self-pay | Admitting: Family Medicine

## 2011-01-19 LAB — BASIC METABOLIC PANEL
BUN: 12 mg/dL (ref 6–23)
Calcium: 9.3 mg/dL (ref 8.4–10.5)
Creat: 0.89 mg/dL (ref 0.50–1.10)
Glucose, Bld: 97 mg/dL (ref 70–99)
Sodium: 139 mEq/L (ref 135–145)

## 2011-01-19 LAB — HEMOGLOBIN A1C: Hgb A1c MFr Bld: 6.6 % — ABNORMAL HIGH (ref <5.7)

## 2011-01-22 ENCOUNTER — Telehealth: Payer: Self-pay | Admitting: Family Medicine

## 2011-01-22 DIAGNOSIS — R0989 Other specified symptoms and signs involving the circulatory and respiratory systems: Secondary | ICD-10-CM | POA: Insufficient documentation

## 2011-01-22 DIAGNOSIS — M5410 Radiculopathy, site unspecified: Secondary | ICD-10-CM | POA: Insufficient documentation

## 2011-01-22 NOTE — Assessment & Plan Note (Signed)
Controlled, no change in medication  

## 2011-01-22 NOTE — Progress Notes (Signed)
  Subjective:    Patient ID: Stephanie Singleton, female    DOB: Nov 19, 1945, 65 y.o.   MRN: [id number]  HPI C/o increased mid back pain with right lower extremity weakness, states she has to drag the leg at times, she also reports numbness in the legs, the pain is increasing in her back . She denies incontinence of stool or urine. Reports intermittent light headedness. States she continues to feel exhausted often , but does not use her CPAP machine regularly, I explained in detail the importance of doing this   Review of Systems Denies recent fever or chills.c/o chronic fatigue and excessive daytime sleepiness Denies sinus pressure, nasal congestion, ear pain or sore throat. Denies chest congestion, productive cough or wheezing. Denies chest pains, palpitations, paroxysmal nocturnal dyspnea, orthopnea and leg swelling Denies abdominal pain, nausea, vomiting,diarrhea or constipation.  Denies dysuria, frequency, hesitancy or incontinence. Denies headaches, seizure, numbness, or tingling. Denies depression, anxiety or insomnia. Denies skin break down or rash.        Objective:   Physical Exam Patient alert and oriented and in no Cardiopulmonary distress.  HEENT: No facial asymmetry, EOMI, no sinus tenderness, TM's clear, Oropharynx pink and moist.  Neck supple no adenopathy.Bruit in carotid arteries  Chest: Clear to auscultation bilaterally.  CVS: S1, S2 no murmurs, no S3.  ABD: Soft non tender. Bowel sounds normal.  Ext: No edema  MS: decreased thoracolumbar ROM spine,adequate in  shoulders, hips and knees.  Skin: Intact, no ulcerations or rash noted.  Psych: Good eye contact, normal affect. Memory intact not anxious or depressed appearing.  CNS: CN 2-12 intact, power, grade 3 to 4 in right lower extremity, decreased sensation in in right upper extremity        Assessment & Plan:

## 2011-01-22 NOTE — Assessment & Plan Note (Signed)
Lightheadedness and bruit, carotid doppler to eval for obstruction

## 2011-01-22 NOTE — Telephone Encounter (Signed)
pls see referrals entered today and let pt know you are working on them  Also pls ensure she has a 4 month f/u in the system

## 2011-01-22 NOTE — Assessment & Plan Note (Signed)
Less well controlled pt needs to lower carb intake and lose weight

## 2011-01-29 ENCOUNTER — Ambulatory Visit (HOSPITAL_COMMUNITY): Payer: Medicare Other

## 2011-01-30 ENCOUNTER — Ambulatory Visit (HOSPITAL_COMMUNITY): Payer: Medicare Other

## 2011-01-30 ENCOUNTER — Other Ambulatory Visit (HOSPITAL_COMMUNITY): Payer: Medicare Other

## 2011-02-06 ENCOUNTER — Ambulatory Visit (HOSPITAL_COMMUNITY)
Admission: RE | Admit: 2011-02-06 | Discharge: 2011-02-06 | Disposition: A | Discharge status: Home / Self Care | Payer: Medicare Other | Source: Ambulatory Visit | Attending: Family Medicine | Admitting: Family Medicine

## 2011-02-06 DIAGNOSIS — M5124 Other intervertebral disc displacement, thoracic region: Secondary | ICD-10-CM | POA: Insufficient documentation

## 2011-02-06 DIAGNOSIS — R0989 Other specified symptoms and signs involving the circulatory and respiratory systems: Secondary | ICD-10-CM

## 2011-02-06 DIAGNOSIS — R209 Unspecified disturbances of skin sensation: Secondary | ICD-10-CM | POA: Insufficient documentation

## 2011-02-06 DIAGNOSIS — E119 Type 2 diabetes mellitus without complications: Secondary | ICD-10-CM | POA: Insufficient documentation

## 2011-02-06 DIAGNOSIS — I10 Essential (primary) hypertension: Secondary | ICD-10-CM | POA: Insufficient documentation

## 2011-02-06 DIAGNOSIS — M546 Pain in thoracic spine: Secondary | ICD-10-CM

## 2011-02-08 NOTE — Progress Notes (Signed)
Pt aware no blockage arteries

## 2011-02-15 ENCOUNTER — Encounter: Payer: Medicare Other | Admitting: *Deleted

## 2011-02-22 ENCOUNTER — Other Ambulatory Visit: Payer: Self-pay | Admitting: Family Medicine

## 2011-03-02 ENCOUNTER — Other Ambulatory Visit (HOSPITAL_COMMUNITY): Payer: Self-pay | Admitting: Neurosurgery

## 2011-03-02 DIAGNOSIS — D3610 Benign neoplasm of peripheral nerves and autonomic nervous system, unspecified: Secondary | ICD-10-CM

## 2011-03-07 ENCOUNTER — Ambulatory Visit (HOSPITAL_COMMUNITY): Payer: Medicare Other

## 2011-03-07 ENCOUNTER — Other Ambulatory Visit: Payer: Self-pay | Admitting: Neurosurgery

## 2011-03-07 DIAGNOSIS — D3610 Benign neoplasm of peripheral nerves and autonomic nervous system, unspecified: Secondary | ICD-10-CM

## 2011-03-13 ENCOUNTER — Other Ambulatory Visit: Payer: Self-pay | Admitting: Family Medicine

## 2011-03-14 ENCOUNTER — Ambulatory Visit
Admission: RE | Admit: 2011-03-14 | Discharge: 2011-03-14 | Disposition: A | Discharge status: Home / Self Care | Payer: Medicare Other | Source: Ambulatory Visit | Attending: Neurosurgery | Admitting: Neurosurgery

## 2011-03-14 DIAGNOSIS — D3610 Benign neoplasm of peripheral nerves and autonomic nervous system, unspecified: Secondary | ICD-10-CM

## 2011-03-29 ENCOUNTER — Ambulatory Visit
Admission: RE | Admit: 2011-03-29 | Discharge: 2011-03-29 | Disposition: A | Discharge status: Home / Self Care | Payer: Medicare Other | Source: Ambulatory Visit | Attending: Neurosurgery | Admitting: Neurosurgery

## 2011-03-29 DIAGNOSIS — D3610 Benign neoplasm of peripheral nerves and autonomic nervous system, unspecified: Secondary | ICD-10-CM

## 2011-03-29 LAB — PROTIME-INR: Prothrombin Time: 32.6 — ABNORMAL HIGH

## 2011-03-29 LAB — BASIC METABOLIC PANEL
CO2: 26
Calcium: 9.4
Creatinine, Ser: 0.77
GFR calc Af Amer: >60
GFR calc non Af Amer: >60
Glucose, Bld: 125 — ABNORMAL HIGH
Sodium: 139

## 2011-03-29 LAB — CBC
MCHC: 35.3
RBC: 4.42
RDW: 13.8

## 2011-03-29 LAB — D-DIMER, QUANTITATIVE: D-Dimer, Quant: 0.22

## 2011-04-02 LAB — BASIC METABOLIC PANEL
Calcium: 9
Chloride: 104
Creatinine, Ser: 0.84
GFR calc Af Amer: >60
GFR calc Af Amer: >60
GFR calc non Af Amer: >60
Potassium: 3.3 — ABNORMAL LOW
Potassium: 3.7
Sodium: 137
Sodium: 137

## 2011-04-02 LAB — DIFFERENTIAL
Basophils Absolute: 0.1
Lymphocytes Relative: 37
Lymphs Abs: 2.3
Monocytes Absolute: 0.6
Monocytes Relative: 10
Neutro Abs: 3

## 2011-04-02 LAB — PROTIME-INR: Prothrombin Time: 41.2 — ABNORMAL HIGH

## 2011-04-02 LAB — CBC
HCT: 37.3
Hemoglobin: 12.4
Hemoglobin: 12.9
MCV: 81.9
RBC: 4.36
RBC: 4.56
WBC: 5.2
WBC: 6.1

## 2011-04-02 LAB — APTT: aPTT: 56 — ABNORMAL HIGH

## 2011-04-02 LAB — SEDIMENTATION RATE: Sed Rate: 30 — ABNORMAL HIGH

## 2011-04-02 LAB — RPR: RPR Ser Ql: NONREACTIVE

## 2011-04-02 LAB — HOMOCYSTEINE: Homocysteine: 12.4

## 2011-04-11 ENCOUNTER — Ambulatory Visit (INDEPENDENT_AMBULATORY_CARE_PROVIDER_SITE_OTHER): Payer: Medicare Other | Admitting: *Deleted

## 2011-04-11 DIAGNOSIS — Z7901 Long term (current) use of anticoagulants: Secondary | ICD-10-CM

## 2011-04-11 DIAGNOSIS — I2699 Other pulmonary embolism without acute cor pulmonale: Secondary | ICD-10-CM

## 2011-04-11 LAB — POCT INR: INR: 2.4

## 2011-04-16 ENCOUNTER — Telehealth: Payer: Self-pay | Admitting: Cardiology

## 2011-04-16 NOTE — Telephone Encounter (Signed)
Schedule appointment with Ms. Lawrence or me.

## 2011-04-19 ENCOUNTER — Other Ambulatory Visit: Payer: Self-pay | Admitting: Family Medicine

## 2011-04-24 ENCOUNTER — Encounter: Payer: Self-pay | Admitting: Adult Health

## 2011-04-24 ENCOUNTER — Ambulatory Visit (INDEPENDENT_AMBULATORY_CARE_PROVIDER_SITE_OTHER): Payer: Medicare Other | Admitting: Adult Health

## 2011-04-24 VITALS — BP 128/81 | HR 70 | Resp 18 | Ht 66.0 in | Wt 225.0 lb

## 2011-04-24 DIAGNOSIS — I10 Essential (primary) hypertension: Secondary | ICD-10-CM

## 2011-04-24 DIAGNOSIS — Z01810 Encounter for preprocedural cardiovascular examination: Secondary | ICD-10-CM

## 2011-04-24 MED ORDER — ENOXAPARIN SODIUM 40 MG/0.4ML ~~LOC~~ SOLN: 40.0000 mg | SUBCUTANEOUS | Status: DC

## 2011-04-24 NOTE — Assessment & Plan Note (Signed)
The patient has been seen and examined by Dr. Dietrich Pates and myself.  She is stable from cardiac standpoint. Vital signs and EKG are reviewed. Recommend proceeding with planned surgery with anticoagulation as follows:  One week prior to surgery stop coumadin and begin lovenox 40 mg SQ daily.  2 days prior to surgery stop lovenox. Begin coumadin again ASAP post-operatively when surgeon is agreeable to restart anticoagulation. She is to follow-up with coumadin clinic as soon as possible after surgery. Follow-up appointment with Dr. Dietrich Pates one month after surgery.  Her daughter is an LPN and will assist her with injections.  This has been explained to her by Dr. Dietrich Pates.  She is anxious about the procedure and the anticoagulation using lovenox, but is willing to proceed. She is advised to call for any questions.

## 2011-04-24 NOTE — Progress Notes (Signed)
HPI:  Stephanie Singleton is a pleasant 65 y/o patient of Dr.Rothbart who has been lost to clinic follow-up with the exception of coumadin INR checks.  She is on coumadin secondary to pulmonary emboli.  She has a history of frequent chest discomfort with no prior stress tests completed; hypertension, and diabetes.  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 Mikal Plane, neurosurgeon, and needs cardiac clearance along with coumadin/anticoagulation recommendations perioperatively..  She has been having progressive weakness of her right side since April of 2012 and has very little use of her right arm or hand, and weakness of right leg, although she can put weight on it. She denies chest pain or dyspnea.   Allergies  Allergen Reactions  . Aspirin     Current Outpatient Prescriptions  Medication Sig Dispense Refill  . ACCU-CHEK AVIVA PLUS test strip TEST ONCE DAILY AS DIRECTED  100 strip  3  . furosemide (LASIX) 20 MG tablet TAKE 1 TABLET BY MOUTH 3 TIMES PER WEEK AS NEEDED FOR SWELLING OF LEGS OR FINGERS  12 tablet  2  . KLOR-CON M20 20 MEQ tablet TAKE 1 TABLET BY MOUTH TWO TIMES A DAY ON THE DAYS THAT YOU TAKE THE LASIX, MAX OF 3 TIMES PER WEEK  24 tablet  2  . Lancets (ACCU-CHEK MULTICLIX) lancets USE AS DIRECTED ONCE DAILY  102 each  3  . lansoprazole (PREVACID) 30 MG capsule Take 30 mg by mouth daily.        Marland Kitchen lisinopril-hydrochlorothiazide (PRINZIDE,ZESTORETIC) 20-25 MG per tablet        . metFORMIN (GLUCOPHAGE) 500 MG tablet        . risperiDONE (RISPERDAL) 0.25 MG tablet TAKE 1 TABLET AT BEDTIME  30 tablet  3  . warfarin (COUMADIN) 5 MG tablet TAKE AS DIRECTED BY COUMADIN(WARFARIN) CLINIC  60 tablet  1  . enoxaparin (LOVENOX) 40 MG/0.4ML SOLN Inject 0.4 mLs (40 mg total) into the skin daily.  5 Syringe  0    Past Medical History  Diagnosis Date  . Diabetes mellitus   . Hypertension   . GERD (gastroesophageal reflux disease)   . Tumor of soft tissue of neck   .  Embolism - blood clot     Pulmonary   . OSA (obstructive sleep apnea)      QIO:NGEXBM of systems complete and found to be negative unless listed above PHYSICAL EXAM BP 128/81  Pulse 70  Resp 18  Ht 5\' 6"  (1.676 m)  Wt 102.059 kg (225 lb)  BMI 36.32 kg/m2 General: Well developed, well nourished, in no acute distress Head: Eyes PERRLA, No xanthomas.   Normal cephalic and atramatic  Lungs: Clear bilaterally to auscultation and percussion. Heart: HRRR S1 S2,.  Pulses are 2+ & equal.            No carotid bruit. No JVD.  No abdominal bruits. No femoral bruits. Abdomen: Bowel sounds are positive, abdomen soft and non-tender without masses or                  Hernia's noted. Msk:  Marked weakness of the right hand and arm with mild lymphedema noted. Moderate weakness of the right leg, no significant edema noted. Left side is functioning normally. Extremities: No clubbing, cyanosis .  DP +1 Neuro: Alert and oriented X 3. Psych:  Good affect, responds appropriately EKG: NSR with LVH, nonspecific infereo/lateral T-wave abnormality.  ASSESSMENT AND PLAN

## 2011-04-24 NOTE — Patient Instructions (Signed)
Your physician has recommended you make the following change in your medication: Stop taking coumadin on November 1, start taking Lovenox 40 mg injections on November 2, 3, 4, 5 and nothing on day 6.  Your physician recommends that you schedule a follow-up appointment: with Misty Stanley in Coumadin clinic ASAP after surgery and 1 month with cardiologist

## 2011-04-29 ENCOUNTER — Other Ambulatory Visit: Payer: Self-pay | Admitting: Family Medicine

## 2011-05-01 ENCOUNTER — Encounter (HOSPITAL_COMMUNITY): Payer: Self-pay

## 2011-05-01 ENCOUNTER — Other Ambulatory Visit (HOSPITAL_COMMUNITY): Payer: Self-pay | Admitting: Neurosurgery

## 2011-05-01 ENCOUNTER — Encounter (HOSPITAL_COMMUNITY)
Admission: RE | Admit: 2011-05-01 | Discharge: 2011-05-01 | Disposition: A | Discharge status: Home / Self Care | Payer: Medicare Other | Source: Ambulatory Visit | Attending: Neurosurgery | Admitting: Neurosurgery

## 2011-05-01 DIAGNOSIS — Z01812 Encounter for preprocedural laboratory examination: Secondary | ICD-10-CM | POA: Insufficient documentation

## 2011-05-01 DIAGNOSIS — Z01818 Encounter for other preprocedural examination: Secondary | ICD-10-CM | POA: Insufficient documentation

## 2011-05-01 DIAGNOSIS — Z538 Procedure and treatment not carried out for other reasons: Secondary | ICD-10-CM | POA: Insufficient documentation

## 2011-05-01 DIAGNOSIS — M4322 Fusion of spine, cervical region: Secondary | ICD-10-CM

## 2011-05-01 HISTORY — DX: Myoneural disorder, unspecified: G70.9

## 2011-05-01 HISTORY — DX: Essential (primary) hypertension: I10

## 2011-05-01 LAB — DIFFERENTIAL
Basophils Absolute: 0 10*3/uL (ref 0.0–0.1)
Eosinophils Absolute: 0.2 10*3/uL (ref 0.0–0.7)
Eosinophils Relative: 3 % (ref 0–5)
Lymphocytes Relative: 42 % (ref 12–46)
Neutrophils Relative %: 47 % (ref 43–77)

## 2011-05-01 LAB — URINALYSIS, ROUTINE W REFLEX MICROSCOPIC
Bilirubin Urine: NEGATIVE
Glucose, UA: NEGATIVE mg/dL
Ketones, ur: NEGATIVE mg/dL
Protein, ur: 30 mg/dL — AB
Urobilinogen, UA: 0.2 mg/dL (ref 0.0–1.0)

## 2011-05-01 LAB — COMPREHENSIVE METABOLIC PANEL
AST: 31 U/L (ref 0–37)
Albumin: 4.3 g/dL (ref 3.5–5.2)
Calcium: 10.3 mg/dL (ref 8.4–10.5)
Creatinine, Ser: 0.85 mg/dL (ref 0.50–1.10)
Sodium: 136 mEq/L (ref 135–145)

## 2011-05-01 LAB — URINE MICROSCOPIC-ADD ON

## 2011-05-01 LAB — SURGICAL PCR SCREEN
MRSA, PCR: NEGATIVE
Staphylococcus aureus: NEGATIVE

## 2011-05-01 LAB — CBC
HCT: 40.5 % (ref 36.0–46.0)
Platelets: 377 10*3/uL (ref 150–400)
RBC: 5.02 MIL/uL (ref 3.87–5.11)
RDW: 14 % (ref 11.5–15.5)
WBC: 7.5 10*3/uL (ref 4.0–10.5)

## 2011-05-01 LAB — PROTIME-INR: INR: 1.88 — ABNORMAL HIGH (ref 0.00–1.49)

## 2011-05-01 LAB — APTT: aPTT: 37 seconds (ref 24–37)

## 2011-05-01 NOTE — Pre-Procedure Instructions (Signed)
20 Clary Mahurin  05/01/2011   Your procedure is scheduled on: 05/09/11  Report to [facility] [id number].  Call this number if you have problems the morning of surgery: [phone]   Remember:   Do not eat food:After Midnight.  Do not drink clear liquids:   Take these medicines the morning of surgery with A SIP OF WATER:prevacid tylenol risperdol.STOP COUMADIN THURS 11/1.BEGIN LOVENOX. LAST DOSE  LOVENOX 11/5     Do not wear jewelry, make-up or nail polish.  Do not wear lotions, powders, or perfumes. You may wear deodorant.  Do not shave 48 hours prior to surgery.  Do not bring valuables to the hospital.  Contacts, dentures or bridgework may not be worn into surgery.  Leave suitcase in the car. After surgery it may be brought to your room.  For patients admitted to the hospital, checkout time is 11:00 AM the day of discharge.   Patients discharged the day of surgery will not be allowed to drive home.  Name and phone number of your driver:TIM [phone] [phone] Special Instructions: CHG Shower Use Special Wash: 1/2 bottle night before surgery and 1/2 bottle morning of surgery.   Please read over the following fact sheets that you were given: Pain Booklet, Coughing and Deep Breathing, MRSA Information, Surgical Site Infection Prevention and Anesthesia Post-op Instructions

## 2011-05-08 ENCOUNTER — Telehealth: Payer: Self-pay | Admitting: Cardiology

## 2011-05-08 NOTE — Telephone Encounter (Signed)
Pt surgery was cancelled and she has been off of coumadin for 6 days needs to know what to do.

## 2011-05-09 ENCOUNTER — Inpatient Hospital Stay (HOSPITAL_COMMUNITY): Admission: RE | Admit: 2011-05-09 | Payer: Medicare Other | Source: Ambulatory Visit | Admitting: Neurosurgery

## 2011-05-09 ENCOUNTER — Ambulatory Visit (INDEPENDENT_AMBULATORY_CARE_PROVIDER_SITE_OTHER): Payer: Self-pay | Admitting: *Deleted

## 2011-05-09 ENCOUNTER — Encounter (HOSPITAL_COMMUNITY): Admission: RE | Payer: Self-pay | Source: Ambulatory Visit

## 2011-05-09 DIAGNOSIS — Z7901 Long term (current) use of anticoagulants: Secondary | ICD-10-CM

## 2011-05-09 DIAGNOSIS — I2699 Other pulmonary embolism without acute cor pulmonale: Secondary | ICD-10-CM

## 2011-05-09 SURGERY — POSTERIOR CERVICAL FUSION/FORAMINOTOMY LEVEL 2
Anesthesia: General

## 2011-05-09 NOTE — Telephone Encounter (Signed)
See coumadin note. 

## 2011-05-14 ENCOUNTER — Ambulatory Visit (INDEPENDENT_AMBULATORY_CARE_PROVIDER_SITE_OTHER): Payer: Medicare Other | Admitting: *Deleted

## 2011-05-14 DIAGNOSIS — I2699 Other pulmonary embolism without acute cor pulmonale: Secondary | ICD-10-CM

## 2011-05-14 DIAGNOSIS — Z7901 Long term (current) use of anticoagulants: Secondary | ICD-10-CM

## 2011-05-14 LAB — POCT INR: INR: 2.3

## 2011-05-16 ENCOUNTER — Encounter: Payer: Medicare Other | Admitting: *Deleted

## 2011-05-17 ENCOUNTER — Other Ambulatory Visit: Payer: Self-pay | Admitting: Family Medicine

## 2011-05-17 ENCOUNTER — Encounter: Payer: Self-pay | Admitting: Cardiology

## 2011-05-29 ENCOUNTER — Ambulatory Visit: Payer: Medicare Other | Admitting: Cardiology

## 2011-05-31 ENCOUNTER — Other Ambulatory Visit: Payer: Self-pay | Admitting: Family Medicine

## 2011-06-07 ENCOUNTER — Encounter: Payer: Medicare Other | Admitting: *Deleted

## 2011-06-09 ENCOUNTER — Other Ambulatory Visit: Payer: Self-pay | Admitting: Family Medicine

## 2011-06-09 DIAGNOSIS — E785 Hyperlipidemia, unspecified: Secondary | ICD-10-CM

## 2011-06-09 DIAGNOSIS — E119 Type 2 diabetes mellitus without complications: Secondary | ICD-10-CM

## 2011-06-13 ENCOUNTER — Telehealth: Payer: Self-pay | Admitting: Family Medicine

## 2011-06-13 NOTE — Telephone Encounter (Signed)
Notified pt to call Coumadin clinic for refill authorizations on Coumadin

## 2011-06-14 ENCOUNTER — Telehealth: Payer: Self-pay | Admitting: Cardiology

## 2011-06-14 MED ORDER — WARFARIN SODIUM 5 MG PO TABS: 2.5000 mg | ORAL_TABLET | Freq: Every day | ORAL | Status: DC

## 2011-06-14 NOTE — Telephone Encounter (Signed)
[facility] NEEDS [facility]ED [facility] TO [facility] [place]

## 2011-06-20 ENCOUNTER — Ambulatory Visit (INDEPENDENT_AMBULATORY_CARE_PROVIDER_SITE_OTHER): Payer: Medicare Other | Admitting: *Deleted

## 2011-06-20 DIAGNOSIS — I2699 Other pulmonary embolism without acute cor pulmonale: Secondary | ICD-10-CM

## 2011-06-20 DIAGNOSIS — Z7901 Long term (current) use of anticoagulants: Secondary | ICD-10-CM

## 2011-07-04 ENCOUNTER — Other Ambulatory Visit: Payer: Self-pay | Admitting: Family Medicine

## 2011-07-18 ENCOUNTER — Encounter: Payer: Medicare Other | Admitting: *Deleted

## 2011-07-20 ENCOUNTER — Encounter: Payer: Self-pay | Admitting: Family Medicine

## 2011-07-24 ENCOUNTER — Encounter: Payer: Self-pay | Admitting: Family Medicine

## 2011-07-24 ENCOUNTER — Ambulatory Visit (INDEPENDENT_AMBULATORY_CARE_PROVIDER_SITE_OTHER): Payer: Medicare Other | Admitting: Family Medicine

## 2011-07-24 VITALS — BP 126/72 | HR 83 | Resp 18 | Ht 65.5 in | Wt 213.1 lb

## 2011-07-24 DIAGNOSIS — D219 Benign neoplasm of connective and other soft tissue, unspecified: Secondary | ICD-10-CM

## 2011-07-24 DIAGNOSIS — N3000 Acute cystitis without hematuria: Secondary | ICD-10-CM

## 2011-07-24 DIAGNOSIS — E785 Hyperlipidemia, unspecified: Secondary | ICD-10-CM

## 2011-07-24 DIAGNOSIS — I10 Essential (primary) hypertension: Secondary | ICD-10-CM

## 2011-07-24 DIAGNOSIS — E669 Obesity, unspecified: Secondary | ICD-10-CM

## 2011-07-24 DIAGNOSIS — R3989 Other symptoms and signs involving the genitourinary system: Secondary | ICD-10-CM

## 2011-07-24 DIAGNOSIS — E119 Type 2 diabetes mellitus without complications: Secondary | ICD-10-CM

## 2011-07-24 LAB — POCT URINALYSIS DIPSTICK
Bilirubin, UA: NEGATIVE
Blood, UA: NEGATIVE
Glucose, UA: NEGATIVE
Ketones, UA: NEGATIVE
pH, UA: 5.5

## 2011-07-24 NOTE — Progress Notes (Signed)
  Subjective:    Patient ID: Stephanie Singleton, female    DOB: 12-23-1945, 66 y.o.   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 Denies sinus pressure, nasal congestion, ear pain or sore throat. Denies chest congestion, productive cough or wheezing. Denies chest pains, palpitations and leg swelling Denies abdominal pain, nausea, vomiting,diarrhea or constipation.    Denies  seizures, does have headaches, and numbness and tingling in right upper extremity Denies depression, anxiety or insomnia. Denies skin break down or rash.        Objective:   Physical Exam Patient alert and oriented and in no cardiopulmonary distress.  HEENT: No facial asymmetry, EOMI, no sinus tenderness,  oropharynx pink and moist.  Neck  decreased ROM, no adenopathy.  Chest: Clear to auscultation bilaterally.  CVS: S1, S2 no murmurs, no S3.  ABD: Soft non tender. Bowel sounds normal.  Ext: No edema  MS: Adequate ROM  shoulders, hips and knees.  Skin: Intact, no ulcerations or rash noted.  Psych: Good eye contact, normal affect. Memory intact not anxious or depressed appearing.  CNS: CN 2-12 intact,decreased power and sensation in RUE      Assessment & Plan:

## 2011-07-24 NOTE — Patient Instructions (Addendum)
F/U in 4 months  Labs today, lipid, cmp, hBA1C  It is important that you exercise regularly at least 30 minutes 5 times a week. If you develop chest pain, have severe difficulty breathing, or feel very tired, stop exercising immediately and seek medical attention  A healthy diet is rich in fruit, vegetables and whole grains. Poultry fish, nuts and beans are a healthy choice for protein rather then red meat. A low sodium diet and drinking 64 ounces of water daily is generally recommended. Oils and sweet should be limited. Carbohydrates especially for those who are diabetic or overweight, should be limited to 34-45 gram per meal. It is important to eat on a regular schedule, at least 3 times daily. Snacks should be primarily fruits, vegetables or nuts.   You will get a handicap sticker.  Keep doing physical therapy, this can help a lot with strengthening. You may call back with name of med, I believe that you should just elevate the hand, you have no clinical evidence of fluid overload  Urine is being tested today

## 2011-07-24 NOTE — Assessment & Plan Note (Addendum)
Lab today Controlled , no med change, encouraged by weight loss associated with lifestyle change

## 2011-07-24 NOTE — Assessment & Plan Note (Signed)
Controlled, no change in medication  

## 2011-07-25 LAB — COMPLETE METABOLIC PANEL WITH GFR
ALT: 14 U/L (ref 0–35)
AST: 16 U/L (ref 0–37)
Albumin: 4.5 g/dL (ref 3.5–5.2)
CO2: 27 mEq/L (ref 19–32)
Calcium: 9.9 mg/dL (ref 8.4–10.5)
Chloride: 100 mEq/L (ref 96–112)
GFR, Est African American: 84 mL/min
Potassium: 3.8 mEq/L (ref 3.5–5.3)
Sodium: 137 mEq/L (ref 135–145)
Total Protein: 7.5 g/dL (ref 6.0–8.3)

## 2011-07-25 LAB — LIPID PANEL
LDL Cholesterol: 160 mg/dL — ABNORMAL HIGH (ref 0–99)
Triglycerides: 153 mg/dL — ABNORMAL HIGH (ref <150)

## 2011-07-27 LAB — URINE CULTURE

## 2011-07-28 DIAGNOSIS — N3000 Acute cystitis without hematuria: Secondary | ICD-10-CM | POA: Insufficient documentation

## 2011-07-28 DIAGNOSIS — E785 Hyperlipidemia, unspecified: Secondary | ICD-10-CM | POA: Insufficient documentation

## 2011-07-28 NOTE — Assessment & Plan Note (Signed)
Pt experiencing increased RUE weakness and also lower extremity weakness. Was to have had surgery last month but decided against this the day before. Currently in therapy, states this is helping , will continue to follow with neurosurgeon

## 2011-07-28 NOTE — Assessment & Plan Note (Signed)
Improved. Pt applauded on succesful weight loss through lifestyle change, and encouraged to continue same. Weight loss goal set for the next several months.  

## 2011-07-28 NOTE — Assessment & Plan Note (Signed)
Markedly elevated lipid profile, needs ro start statin therapy, p to be contacted about this

## 2011-07-28 NOTE — Assessment & Plan Note (Signed)
Abnormal ccua with symptoms will f/u on c/s prior to treating

## 2011-08-08 ENCOUNTER — Telehealth: Payer: Self-pay | Admitting: Family Medicine

## 2011-08-08 ENCOUNTER — Ambulatory Visit (INDEPENDENT_AMBULATORY_CARE_PROVIDER_SITE_OTHER): Payer: Medicare Other | Admitting: *Deleted

## 2011-08-08 DIAGNOSIS — I2699 Other pulmonary embolism without acute cor pulmonale: Secondary | ICD-10-CM

## 2011-08-08 DIAGNOSIS — Z7901 Long term (current) use of anticoagulants: Secondary | ICD-10-CM

## 2011-08-08 NOTE — Telephone Encounter (Signed)
Ordered labs to be done next visit

## 2011-08-08 NOTE — Telephone Encounter (Signed)
Spoke with pt and notified her that we do have meters available.

## 2011-08-08 NOTE — Telephone Encounter (Signed)
Came by and we gave her a meter

## 2011-08-09 ENCOUNTER — Other Ambulatory Visit: Payer: Self-pay | Admitting: Family Medicine

## 2011-08-28 ENCOUNTER — Other Ambulatory Visit: Payer: Self-pay

## 2011-08-28 MED ORDER — GLUCOSE BLOOD VI STRP: ORAL_STRIP | Status: DC

## 2011-09-15 ENCOUNTER — Other Ambulatory Visit: Payer: Self-pay | Admitting: Family Medicine

## 2011-10-11 ENCOUNTER — Encounter: Payer: Self-pay | Admitting: Family Medicine

## 2011-10-11 ENCOUNTER — Ambulatory Visit (INDEPENDENT_AMBULATORY_CARE_PROVIDER_SITE_OTHER): Payer: Medicare Other | Admitting: Family Medicine

## 2011-10-11 VITALS — BP 112/80 | HR 55 | Resp 15 | Ht 65.5 in | Wt 208.1 lb

## 2011-10-11 DIAGNOSIS — E669 Obesity, unspecified: Secondary | ICD-10-CM

## 2011-10-11 DIAGNOSIS — E785 Hyperlipidemia, unspecified: Secondary | ICD-10-CM

## 2011-10-11 DIAGNOSIS — E119 Type 2 diabetes mellitus without complications: Secondary | ICD-10-CM

## 2011-10-11 DIAGNOSIS — R609 Edema, unspecified: Secondary | ICD-10-CM | POA: Insufficient documentation

## 2011-10-11 DIAGNOSIS — D219 Benign neoplasm of connective and other soft tissue, unspecified: Secondary | ICD-10-CM

## 2011-10-11 DIAGNOSIS — I10 Essential (primary) hypertension: Secondary | ICD-10-CM

## 2011-10-11 MED ORDER — TORSEMIDE 20 MG PO TABS: ORAL_TABLET | ORAL | Status: DC

## 2011-10-11 NOTE — Patient Instructions (Addendum)
F/U in 2.5 month  HBa1C, fasting lipid, cmp and EGFR, tsh  Please start the statin for cholesterol management  You will get torsemide per your request in place of  Lasix 3 times per week. Continue the potassium as before   tDaP is recommended today.   Please schedule your mammogram this is past due.  Let me know when you decide to see neurology regarding concerns you voice about possibly having MS. ONly a neurlogist can diagnose this

## 2011-10-11 NOTE — Progress Notes (Signed)
  Subjective:    Patient ID: Stephanie Singleton, female    DOB: [birth year], 66 y.o.   MRN: [id number]  HPI  Hit left 4th toe accidentaly about 2 to 3 weeks ago since then dark, no redness, warmth or drainage, wants this checked. Concerned as to whether she has mS, reports numbness in other limbs than the RUE,unwilling to have neurological eval at  this time. C/o intermittent swelling, espescialy of the fingers and hands, requests torsemide in place of lasix states the latter does not work The PT is here for follow up and re-evaluation of chronic medical conditions, medication management and review of any available recent lab and radiology data.  Preventive health is updated, specifically  Cancer screening and Immunization.   Questions or concerns regarding consultations or procedures which the PT has had in the interim are  addressed. The PT denies any adverse reactions to current medications since the last visit.    Review of Systems See HPI Denies recent fever or chills. Denies sinus pressure, nasal congestion, ear pain or sore throat. Denies chest congestion, productive cough or wheezing. Denies chest pains, palpitations Denies abdominal pain, nausea, vomiting,diarrhea or constipation.   Denies dysuria, frequency, hesitancy or incontinence.  Denies headaches, seizures,  Denies depression, anxiety or insomnia. Denies skin break down or rash.        Objective:   Physical Exam Patient alert and oriented and in no cardiopulmonary distress.  HEENT: No facial asymmetry, EOMI, no sinus tenderness,  oropharynx pink and moist.  Neck supple no adenopathy.  Chest: Clear to auscultation bilaterally.  CVS: S1, S2 no murmurs, no S3.  ABD: Soft non tender. Bowel sounds normal.  Ext: No edema  MS: Adequate ROM spine, shoulders, hips and knees.  Skin: Intact, no ulcerations or rash noted.  Psych: Good eye contact, normal affect. Memory intact not anxious or depressed  appearing.  CNS: CN 2-12 intact Diabetic Foot Check:  Appearance - no lesions, ulcers or calluses Skin - no unusual pallor or redness Sensation - grossly intact to light touch Monofilament testing -  Right - Great toe, medial, central, lateral ball and posterior foot intact Left - Great toe, medial, central, lateral ball and posterior foot intact Pulses Left - Dorsalis Pedis and Posterior Tibia normal Right - Dorsalis Pedis and Posterior Tibia normal .        Assessment & Plan:

## 2011-10-14 NOTE — Assessment & Plan Note (Signed)
Unchanged. Patient re-educated about  the importance of commitment to a  minimum of 150 minutes of exercise per week. The importance of healthy food choices with portion control discussed. Encouraged to start a food diary, count calories and to consider  joining a support group. Sample diet sheets offered. Goals set by the patient for the next several months.    

## 2011-10-14 NOTE — Assessment & Plan Note (Signed)
Controlled, no change in medication  

## 2011-10-14 NOTE — Assessment & Plan Note (Signed)
Worsening upper extremity weakness, however undecided about surgery still

## 2011-10-18 ENCOUNTER — Ambulatory Visit (INDEPENDENT_AMBULATORY_CARE_PROVIDER_SITE_OTHER): Payer: Medicare Other | Admitting: *Deleted

## 2011-10-18 DIAGNOSIS — Z7901 Long term (current) use of anticoagulants: Secondary | ICD-10-CM

## 2011-10-18 DIAGNOSIS — I2699 Other pulmonary embolism without acute cor pulmonale: Secondary | ICD-10-CM

## 2011-11-06 ENCOUNTER — Other Ambulatory Visit: Payer: Self-pay | Admitting: Family Medicine

## 2011-12-05 ENCOUNTER — Ambulatory Visit (INDEPENDENT_AMBULATORY_CARE_PROVIDER_SITE_OTHER): Payer: Medicare Other | Admitting: *Deleted

## 2011-12-05 DIAGNOSIS — Z7901 Long term (current) use of anticoagulants: Secondary | ICD-10-CM

## 2011-12-05 DIAGNOSIS — I2699 Other pulmonary embolism without acute cor pulmonale: Secondary | ICD-10-CM

## 2011-12-05 LAB — POCT INR: INR: 1.6

## 2011-12-13 ENCOUNTER — Telehealth: Payer: Self-pay | Admitting: Family Medicine

## 2011-12-18 NOTE — Telephone Encounter (Signed)
Labs mailed

## 2012-01-02 ENCOUNTER — Ambulatory Visit: Payer: Medicare Other | Admitting: Family Medicine

## 2012-01-31 ENCOUNTER — Ambulatory Visit: Payer: Medicare Other | Admitting: Family Medicine

## 2012-02-05 ENCOUNTER — Ambulatory Visit: Payer: Medicare Other | Admitting: Family Medicine

## 2012-02-12 ENCOUNTER — Other Ambulatory Visit: Payer: Self-pay | Admitting: Family Medicine

## 2012-03-11 ENCOUNTER — Ambulatory Visit: Payer: Medicare Other | Admitting: Family Medicine

## 2012-03-12 ENCOUNTER — Encounter: Payer: Self-pay | Admitting: Family Medicine

## 2012-03-25 ENCOUNTER — Other Ambulatory Visit: Payer: Self-pay | Admitting: Family Medicine

## 2012-04-23 ENCOUNTER — Encounter: Payer: Self-pay | Admitting: *Deleted

## 2012-04-30 ENCOUNTER — Telehealth: Payer: Self-pay | Admitting: Family Medicine

## 2012-04-30 DIAGNOSIS — R5381 Other malaise: Secondary | ICD-10-CM

## 2012-04-30 DIAGNOSIS — I10 Essential (primary) hypertension: Secondary | ICD-10-CM

## 2012-04-30 DIAGNOSIS — E785 Hyperlipidemia, unspecified: Secondary | ICD-10-CM

## 2012-04-30 DIAGNOSIS — E119 Type 2 diabetes mellitus without complications: Secondary | ICD-10-CM

## 2012-04-30 NOTE — Telephone Encounter (Signed)
Faxed to lab.

## 2012-05-01 ENCOUNTER — Ambulatory Visit (INDEPENDENT_AMBULATORY_CARE_PROVIDER_SITE_OTHER): Payer: Medicare Other | Admitting: *Deleted

## 2012-05-01 DIAGNOSIS — I2699 Other pulmonary embolism without acute cor pulmonale: Secondary | ICD-10-CM

## 2012-05-01 DIAGNOSIS — Z7901 Long term (current) use of anticoagulants: Secondary | ICD-10-CM

## 2012-05-01 LAB — POCT INR: INR: 1.2

## 2012-05-15 ENCOUNTER — Ambulatory Visit (INDEPENDENT_AMBULATORY_CARE_PROVIDER_SITE_OTHER): Payer: Medicare Other | Admitting: *Deleted

## 2012-05-15 DIAGNOSIS — I2699 Other pulmonary embolism without acute cor pulmonale: Secondary | ICD-10-CM

## 2012-05-15 DIAGNOSIS — Z7901 Long term (current) use of anticoagulants: Secondary | ICD-10-CM

## 2012-05-15 LAB — POCT INR: INR: 1.8

## 2012-05-18 ENCOUNTER — Other Ambulatory Visit: Payer: Self-pay | Admitting: Family Medicine

## 2012-05-22 ENCOUNTER — Telehealth: Payer: Self-pay | Admitting: Family Medicine

## 2012-05-22 NOTE — Telephone Encounter (Signed)
Faxed and msg left

## 2012-06-03 ENCOUNTER — Other Ambulatory Visit: Payer: Self-pay | Admitting: Family Medicine

## 2012-06-04 LAB — COMPREHENSIVE METABOLIC PANEL
ALT: 12 U/L (ref 0–35)
AST: 17 U/L (ref 0–37)
Albumin: 4.7 g/dL (ref 3.5–5.2)
Alkaline Phosphatase: 94 U/L (ref 39–117)
Calcium: 9.9 mg/dL (ref 8.4–10.5)
Chloride: 101 mEq/L (ref 96–112)
Creat: 0.88 mg/dL (ref 0.50–1.10)
Potassium: 4 mEq/L (ref 3.5–5.3)

## 2012-06-04 LAB — LIPID PANEL
LDL Cholesterol: 152 mg/dL — ABNORMAL HIGH (ref 0–99)
Total CHOL/HDL Ratio: 4.8 Ratio

## 2012-06-04 LAB — TSH: TSH: 0.625 u[IU]/mL (ref 0.350–4.500)

## 2012-06-04 LAB — HEMOGLOBIN A1C: Hgb A1c MFr Bld: 6.1 % — ABNORMAL HIGH (ref <5.7)

## 2012-06-05 ENCOUNTER — Ambulatory Visit (INDEPENDENT_AMBULATORY_CARE_PROVIDER_SITE_OTHER): Payer: Medicare Other | Admitting: Family Medicine

## 2012-06-05 ENCOUNTER — Encounter: Payer: Self-pay | Admitting: Family Medicine

## 2012-06-05 ENCOUNTER — Ambulatory Visit (INDEPENDENT_AMBULATORY_CARE_PROVIDER_SITE_OTHER): Payer: Medicare Other | Admitting: *Deleted

## 2012-06-05 VITALS — BP 130/80 | HR 81 | Resp 15 | Ht 65.5 in | Wt 221.0 lb

## 2012-06-05 DIAGNOSIS — E785 Hyperlipidemia, unspecified: Secondary | ICD-10-CM

## 2012-06-05 DIAGNOSIS — I10 Essential (primary) hypertension: Secondary | ICD-10-CM

## 2012-06-05 DIAGNOSIS — Z7901 Long term (current) use of anticoagulants: Secondary | ICD-10-CM

## 2012-06-05 DIAGNOSIS — I2699 Other pulmonary embolism without acute cor pulmonale: Secondary | ICD-10-CM

## 2012-06-05 DIAGNOSIS — E119 Type 2 diabetes mellitus without complications: Secondary | ICD-10-CM

## 2012-06-05 DIAGNOSIS — D219 Benign neoplasm of connective and other soft tissue, unspecified: Secondary | ICD-10-CM

## 2012-06-05 DIAGNOSIS — M25519 Pain in unspecified shoulder: Secondary | ICD-10-CM

## 2012-06-05 MED ORDER — GLUCOSE BLOOD VI STRP: ORAL_STRIP | Status: DC

## 2012-06-05 MED ORDER — PRAVASTATIN SODIUM 20 MG PO TABS: 20.0000 mg | ORAL_TABLET | Freq: Every evening | ORAL | Status: DC

## 2012-06-05 MED ORDER — GABAPENTIN 100 MG PO CAPS: ORAL_CAPSULE | ORAL | Status: DC

## 2012-06-05 MED ORDER — ACCU-CHEK MULTICLIX LANCETS MISC: Status: DC

## 2012-06-05 NOTE — Patient Instructions (Addendum)
F/u in 4 month  Please reconsider surgery on your neck, you need this.  Cholesterol is high , start pravchol every night and reduce fried and fatty foods please   You DO need a colonoscopy  You will be referred to Dr Madelon Lips re right shoulder  Script for oNCE daily blood sugar testing  [facility].  Script for cane   Fasting lipid, cmp and EGFR, cbc in 4 month, microalb today if possible  New is gabapentin for nerve pain

## 2012-06-05 NOTE — Progress Notes (Signed)
  Subjective:    Patient ID: Stephanie Singleton, female    DOB: 01/10/1946, 66 y.o.   MRN: [id number]  HPI The PT is here for follow up and re-evaluation of chronic medical conditions, medication management and review of any available recent lab and radiology data.  Preventive health is updated, specifically  Cancer screening and Immunization.   Questions or concerns regarding consultations or procedures which the PT has had in the interim are  addressed. The PT denies any adverse reactions to current medications since the last visit.  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 Denies polyuria, polydipsia, blurred vision    Review of Systems See HPI Denies recent fever or chills. Denies sinus pressure, nasal congestion, ear pain or sore throat. Denies chest congestion, productive cough or wheezing. Denies chest pains, palpitations and leg swelling Denies abdominal pain, nausea, vomiting,diarrhea or constipation.   Denies dysuria, frequency, hesitancy or incontinence. Denies headaches, seizure,c/o  numbness,  and tingling. Denies depression, anxiety or insomnia. Denies skin break down or rash.        Objective:   Physical Exam Patient alert and oriented and in no cardiopulmonary distress.  HEENT: No facial asymmetry, EOMI, no sinus tenderness,  oropharynx pink and moist.  Neck decreased ROM no adenopathy.  Chest: Clear to auscultation bilaterally.  CVS: S1, S2 no murmurs, no S3.  ABD: Soft non tender. Bowel sounds normal.  Ext: No edema  MS: Decreased ROM and , shoulders Skin: Intact, no ulcerations or rash  noted.  Psych: Good eye contact, normal affect. Memory intact not anxious or depressed appearing.  CNS: CN 2-12 intact       Assessment & Plan:

## 2012-06-08 NOTE — Assessment & Plan Note (Signed)
Controlled, no change in medication Patient educated about the importance of limiting  Carbohydrate intake , the need to commit to daily physical activity for a minimum of 30 minutes , and to commit weight loss. The fact that changes in all these areas will reduce or eliminate all together the development of diabetes is stressed.    

## 2012-06-08 NOTE — Assessment & Plan Note (Signed)
Controlled, no change in medication DASH diet and commitment to daily physical activity for a minimum of 30 minutes discussed and encouraged, as a part of hypertension management. The importance of attaining a healthy weight is also discussed.  

## 2012-06-08 NOTE — Assessment & Plan Note (Signed)
Deteriorated will refer for ortho re eval

## 2012-06-08 NOTE — Assessment & Plan Note (Signed)
Uncontrolled.  Pt to start med as prescribed, low fat diet also discussed and encouraged

## 2012-06-08 NOTE — Assessment & Plan Note (Addendum)
Increasing size with more complications, pt still resistant to surgery C/o neuropathic pain, will prescribe gabapentin

## 2012-06-09 ENCOUNTER — Telehealth: Payer: Self-pay | Admitting: Family Medicine

## 2012-06-09 NOTE — Telephone Encounter (Signed)
Patient is aware 

## 2012-06-09 NOTE — Telephone Encounter (Signed)
I have no medical reasons to document why she needs a 2 bedroom apt, pls let her know.

## 2012-06-09 NOTE — Telephone Encounter (Signed)
She is currently in a 2 bedroom apt and they are wanting to move her to a 1 bedroom and they told her the only way she could stay is if she got a note from her Dr stating that for "medical reasons?" she needed to stay in her current apt. Wanted to see if you would be willing to do so.

## 2012-06-13 ENCOUNTER — Other Ambulatory Visit: Payer: Self-pay | Admitting: Family Medicine

## 2012-07-09 ENCOUNTER — Telehealth: Payer: Self-pay | Admitting: Cardiology

## 2012-07-09 NOTE — Telephone Encounter (Signed)
Patient no showed appointment.  Message left for patient to call office to reschedule.  Letter also mailed. / tgs  °

## 2012-08-06 ENCOUNTER — Other Ambulatory Visit: Payer: Self-pay | Admitting: Family Medicine

## 2012-08-06 ENCOUNTER — Other Ambulatory Visit: Payer: Self-pay | Admitting: Cardiology

## 2012-08-07 NOTE — Telephone Encounter (Signed)
rx sent to pharmacy by e-script  

## 2012-08-12 ENCOUNTER — Ambulatory Visit (INDEPENDENT_AMBULATORY_CARE_PROVIDER_SITE_OTHER): Payer: Medicare Other | Admitting: Family Medicine

## 2012-08-12 ENCOUNTER — Encounter: Payer: Self-pay | Admitting: Family Medicine

## 2012-08-12 VITALS — BP 140/82 | HR 62 | Resp 16 | Ht 65.5 in | Wt 222.4 lb

## 2012-08-12 DIAGNOSIS — E119 Type 2 diabetes mellitus without complications: Secondary | ICD-10-CM

## 2012-08-12 DIAGNOSIS — L0291 Cutaneous abscess, unspecified: Secondary | ICD-10-CM

## 2012-08-12 DIAGNOSIS — D219 Benign neoplasm of connective and other soft tissue, unspecified: Secondary | ICD-10-CM

## 2012-08-12 DIAGNOSIS — R5381 Other malaise: Secondary | ICD-10-CM

## 2012-08-12 DIAGNOSIS — E669 Obesity, unspecified: Secondary | ICD-10-CM

## 2012-08-12 DIAGNOSIS — R5383 Other fatigue: Secondary | ICD-10-CM

## 2012-08-12 DIAGNOSIS — I10 Essential (primary) hypertension: Secondary | ICD-10-CM

## 2012-08-12 DIAGNOSIS — D500 Iron deficiency anemia secondary to blood loss (chronic): Secondary | ICD-10-CM

## 2012-08-12 DIAGNOSIS — E785 Hyperlipidemia, unspecified: Secondary | ICD-10-CM

## 2012-08-12 DIAGNOSIS — L0390 Cellulitis, unspecified: Secondary | ICD-10-CM

## 2012-08-12 MED ORDER — CEPHALEXIN 500 MG PO CAPS: 500.0000 mg | ORAL_CAPSULE | Freq: Three times a day (TID) | ORAL | Status: AC

## 2012-08-12 NOTE — Patient Instructions (Addendum)
F/u end  April/early May   Fasting lipids, cmp and EGFr, hBa1C, CBC microalb , end April    Keflex is prescribed for cellulitis

## 2012-08-13 ENCOUNTER — Ambulatory Visit (INDEPENDENT_AMBULATORY_CARE_PROVIDER_SITE_OTHER): Payer: Medicare Other | Admitting: *Deleted

## 2012-08-13 DIAGNOSIS — I2699 Other pulmonary embolism without acute cor pulmonale: Secondary | ICD-10-CM

## 2012-08-13 DIAGNOSIS — Z7901 Long term (current) use of anticoagulants: Secondary | ICD-10-CM

## 2012-08-13 NOTE — Assessment & Plan Note (Signed)
Progressive right upper and lower extremity weakness, pt seriously considering surgery

## 2012-08-13 NOTE — Assessment & Plan Note (Signed)
Deteriorated. Patient re-educated about  the importance of commitment to a  minimum of 150 minutes of exercise per week. The importance of healthy food choices with portion control discussed. Encouraged to start a food diary, count calories and to consider  joining a support group. Sample diet sheets offered. Goals set by the patient for the next several months.    

## 2012-08-13 NOTE — Assessment & Plan Note (Signed)
Sub optimal control, excess weight gain, no med change DASH diet and commitment to daily physical activity for a minimum of 30 minutes discussed and encouraged, as a part of hypertension management. The importance of attaining a healthy weight is also discussed.

## 2012-08-13 NOTE — Progress Notes (Signed)
  Subjective:    Patient ID: Stephanie Singleton, female    DOB: Aug 10, 1945, 68 y.o.   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 Denies sinus pressure, nasal congestion, ear pain or sore throat. Denies chest congestion, productive cough or wheezing. Denies chest pains, palpitations and leg swelling Denies abdominal pain, nausea, vomiting,diarrhea or constipation.   Denies dysuria, frequency, hesitancy or incontinence.  Denies headaches, seizures,  Denies depression, anxiety or insomnia. .        Objective:   Physical Exam Patient alert and oriented and in no cardiopulmonary distress.  HEENT: No facial asymmetry, EOMI, no sinus tenderness,  oropharynx pink and moist.  Neck supple no adenopathy.  Chest: Clear to auscultation bilaterally.  CVS: S1, S2 no murmurs, no S3.  ABD: Soft non tender. Bowel sounds normal.  Ext: No edema  MS: Adequate ROM spine, shoulders, hips and knees.Abnormal gait  Skin:laceration on right leg, length approx 4 inches, erythema and warmth in area  Psych: Good eye contact, normal affect. Memory intact not anxious or depressed appearing.  CNS: CN 2-12 intact, power, tone and sensation decreased in right upper and lower extremities        Assessment & Plan:

## 2012-08-13 NOTE — Assessment & Plan Note (Signed)
Acute infection on right leg, antibiotic course prescribed

## 2012-08-13 NOTE — Assessment & Plan Note (Signed)
Uncontrolled, updated lab next visit Hyperlipidemia:Low fat diet discussed and encouraged.

## 2012-08-13 NOTE — Assessment & Plan Note (Signed)
Controlled, no change in medication Patient advised to reduce carb and sweets, commit to regular physical activity, take meds as prescribed, test blood as directed, and attempt to lose weight, to improve blood sugar control.  

## 2012-08-26 ENCOUNTER — Encounter (HOSPITAL_COMMUNITY): Payer: Self-pay | Admitting: Emergency Medicine

## 2012-08-26 ENCOUNTER — Emergency Department (HOSPITAL_COMMUNITY)
Admission: EM | Admit: 2012-08-26 | Discharge: 2012-08-26 | Disposition: A | Discharge status: Home / Self Care | Payer: Medicare Other | Attending: Emergency Medicine | Admitting: Emergency Medicine

## 2012-08-26 DIAGNOSIS — I82401 Acute embolism and thrombosis of unspecified deep veins of right lower extremity: Secondary | ICD-10-CM

## 2012-08-26 DIAGNOSIS — I10 Essential (primary) hypertension: Secondary | ICD-10-CM | POA: Insufficient documentation

## 2012-08-26 DIAGNOSIS — M7989 Other specified soft tissue disorders: Secondary | ICD-10-CM

## 2012-08-26 DIAGNOSIS — Z872 Personal history of diseases of the skin and subcutaneous tissue: Secondary | ICD-10-CM | POA: Insufficient documentation

## 2012-08-26 DIAGNOSIS — E119 Type 2 diabetes mellitus without complications: Secondary | ICD-10-CM | POA: Insufficient documentation

## 2012-08-26 DIAGNOSIS — K219 Gastro-esophageal reflux disease without esophagitis: Secondary | ICD-10-CM | POA: Insufficient documentation

## 2012-08-26 DIAGNOSIS — I82409 Acute embolism and thrombosis of unspecified deep veins of unspecified lower extremity: Secondary | ICD-10-CM | POA: Insufficient documentation

## 2012-08-26 DIAGNOSIS — Z8669 Personal history of other diseases of the nervous system and sense organs: Secondary | ICD-10-CM | POA: Insufficient documentation

## 2012-08-26 DIAGNOSIS — Z79899 Other long term (current) drug therapy: Secondary | ICD-10-CM | POA: Insufficient documentation

## 2012-08-26 DIAGNOSIS — Z86711 Personal history of pulmonary embolism: Secondary | ICD-10-CM | POA: Insufficient documentation

## 2012-08-26 DIAGNOSIS — Z7901 Long term (current) use of anticoagulants: Secondary | ICD-10-CM | POA: Insufficient documentation

## 2012-08-26 DIAGNOSIS — Z87891 Personal history of nicotine dependence: Secondary | ICD-10-CM | POA: Insufficient documentation

## 2012-08-26 LAB — PROTIME-INR
INR: 2.09 — ABNORMAL HIGH (ref 0.00–1.49)
Prothrombin Time: 22.6 seconds — ABNORMAL HIGH (ref 11.6–15.2)

## 2012-08-26 MED ORDER — CLINDAMYCIN HCL 150 MG PO CAPS
450.0000 mg | ORAL_CAPSULE | Freq: Once | ORAL | Status: AC
  Administered 2012-08-26: 450 mg via ORAL
  Filled 2012-08-26: qty 3

## 2012-08-26 MED ORDER — CLINDAMYCIN HCL 150 MG PO CAPS: 450.0000 mg | ORAL_CAPSULE | Freq: Three times a day (TID) | ORAL | Status: DC

## 2012-08-26 NOTE — ED Notes (Signed)
Patient states she got a scratch on her right lower leg about 6 weeks ago.  Patient states it got infected and she went to her doctor and was put on Keflex.  Patient presents to ER tonight with c/o continued infection and pain to right lower leg.

## 2012-08-26 NOTE — ED Provider Notes (Signed)
History     CSN: [id number]  Arrival date & time 08/26/12  2004   First MD Initiated Contact with Patient 08/26/12 2018      Chief Complaint  Patient presents with  . Wound Infection    (Consider location/radiation/quality/duration/timing/severity/associated sxs/prior treatment) HPI Comments: Recent treatment for leg cellulitis with Keflex. Worsening pain and swelling since starting the antibiotics.  Patient is a 67 y.o. female presenting with leg pain. The history is provided by the patient.  Leg Pain Location:  Leg Time since incident:  10 days Injury: no   Leg location:  R leg Pain details:    Quality:  Aching   Radiates to:  Does not radiate   Severity:  Mild   Onset quality:  Gradual   Duration:  10 days Associated symptoms: no fever     Past Medical History  Diagnosis Date  . Tumor of soft tissue of neck   . Embolism - blood clot 2004    Pulmonary .on coumadin  . OSA (obstructive sleep apnea) DX 8 YRS AGO.DOES NOT WEAR MASK  . Diabetes mellitus DX X 1 YR    USE METFORMIN ACCORDING TO CBG RESULT  . GERD (gastroesophageal reflux disease)     ON PREVACID  . 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 Hypertension     CARDIOLOGY VISIT.CARDIAC CLEARANCE.LOVENOX INSTRUCTIONS.  Marland Kitchen HTN (hypertension)     EKG 10/23 IN EPIC    History reviewed. No pertinent past surgical history.  Family History  Problem Relation Age of Onset  . Heart disease Mother   . Hypertension Mother   . Stroke Mother   . Diabetes Father   . Peripheral vascular disease Father   . Peripheral vascular disease Brother   . Heart disease Brother     History  Substance Use Topics  . Smoking status: Former Games developer  . Smokeless tobacco: Not on file  . Alcohol Use: No    OB History   Grav Para Term Preterm Abortions TAB SAB Ect Mult Living                  Review of Systems  Constitutional: Negative for fever and chills.    Respiratory: Negative for cough.   Cardiovascular: Negative for chest pain.  All other systems reviewed and are negative.    Allergies  Aspirin  Home Medications   Current Outpatient Rx  Name  Route  Sig  Dispense  Refill  . doxepin (SINEQUAN) 10 MG capsule   Oral   Take 30-80 mg by mouth at bedtime as needed. For pain          . furosemide (LASIX) 20 MG tablet      TAKE 1 TABLET BY MOUTH 3 TIMES PER WEEK AS NEEDED FOR SWELLING OF LEGS OR FINGERS   12 tablet   2   . gabapentin (NEURONTIN) 100 MG capsule      One capsule at bedtime for nerve pain   30 capsule   3   . glucose blood (ACCU-CHEK AVIVA PLUS) test strip      Use as instructed once daily testing   100 each   3   . KLOR-CON M20 20 MEQ tablet      TAKE 1 TABLET BY MOUTH TWO TIMES A DAY ON THE DAYS THAT YOU TAKE THE LASIX, MAX OF 3 TIMES PER WEEK   24 tablet   2   .  Lancets (ACCU-CHEK MULTICLIX) lancets      Use as instructed for once daily testing DX 250.00   100 each   3   . lansoprazole (PREVACID) 30 MG capsule   Oral   Take 30 mg by mouth daily.           Marland Kitchen lisinopril-hydrochlorothiazide (PRINZIDE,ZESTORETIC) 20-25 MG per tablet   Oral   Take 1 tablet by mouth daily.          Marland Kitchen lisinopril-hydrochlorothiazide (PRINZIDE,ZESTORETIC) 20-25 MG per tablet      TAKE 1 & 1/2 TABLETS BY MOUTH EVERY DAY   45 tablet   4   . metFORMIN (GLUCOPHAGE) 500 MG tablet      TAKE 1 TABLET BY MOUTH TWO TIMES A DAY   60 tablet   4   . pravastatin (PRAVACHOL) 20 MG tablet   Oral   Take 1 tablet (20 mg total) by mouth every evening.   30 tablet   11     Discontinue pravachol 40mg    . risperiDONE (RISPERDAL) 0.25 MG tablet      TAKE 1 TABLET AT BEDTIME   30 tablet   3   . torsemide (DEMADEX) 20 MG tablet      One tablet three times per week as needed for swelling, discontinue furosemide effective 10/11/2011   12 tablet   5   . warfarin (COUMADIN) 5 MG tablet      TAKE 1 TABLET ON  MONDAYS AND THURSDAYS. TAKE 1/2 TABLET THE REST OF THE WEEK.   90 tablet   0     BP 142/92  Pulse 75  Temp(Src) 98.3 F (36.8 C) (Oral)  Resp 20  Ht 5\' 6"  (1.676 m)  Wt 222 lb (100.699 kg)  BMI 35.85 kg/m2  SpO2 100%  Physical Exam  Nursing note and vitals reviewed. Constitutional: She is oriented to person, place, and time. She appears well-developed and well-nourished. No distress.  HENT:  Head: Normocephalic and atraumatic.  Eyes: EOM are normal. Pupils are equal, round, and reactive to light.  Neck: Normal range of motion. Neck supple.  Cardiovascular: Normal rate and regular rhythm.  Exam reveals no friction rub.   No murmur heard. Pulmonary/Chest: Effort normal and breath sounds normal. No respiratory distress. She has no wheezes. She has no rales.  Abdominal: Soft. She exhibits no distension. There is no tenderness. There is no rebound.  Musculoskeletal: Normal range of motion. She exhibits edema (1+ edema of RLE).       Legs: Neurological: She is alert and oriented to person, place, and time.  Skin: She is not diaphoretic.    ED Course  Procedures (including critical care time)  Labs Reviewed  PROTIME-INR - Abnormal; Notable for the following:    Prothrombin Time 22.6 (*)    INR 2.09 (*)    All other components within normal limits   No results found.   1. 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 She's been on Keflex for a cellulitis and just finished a ten-day course. She's had increased pain and swelling since being on the Keflex. She denies any fevers, nausea, vomiting, or other systemic symptoms. Here her vitals are stable. She has mild pitting right lower leg edema up to the superior tibia. She has normal pulses in the foot. She has small lesion with mild erythema on the lateral  lower tibia. It is not appear to be an abscess. We will check her INR  as she is currently on Coumadin and if it is subtherapeutic, we'll give her Lovenox. We will set her up for DVT imaging in the morning. I will also put her on clindamycin as Keflex may not have been sufficient to cure her cellulitis. INR therapeutic, no need for Lovenox.        Elwin Mocha, MD 08/26/12 2155

## 2012-08-27 ENCOUNTER — Ambulatory Visit (HOSPITAL_COMMUNITY)
Admission: RE | Admit: 2012-08-27 | Discharge: 2012-08-27 | Disposition: A | Discharge status: Home / Self Care | Payer: Medicare Other | Source: Ambulatory Visit | Attending: Emergency Medicine | Admitting: Emergency Medicine

## 2012-08-27 ENCOUNTER — Ambulatory Visit (HOSPITAL_COMMUNITY): Admission: RE | Admit: 2012-08-27 | Payer: Medicare Other | Source: Ambulatory Visit

## 2012-08-27 DIAGNOSIS — I82409 Acute embolism and thrombosis of unspecified deep veins of unspecified lower extremity: Secondary | ICD-10-CM | POA: Insufficient documentation

## 2012-08-27 DIAGNOSIS — I82401 Acute embolism and thrombosis of unspecified deep veins of right lower extremity: Secondary | ICD-10-CM

## 2012-08-28 ENCOUNTER — Telehealth: Payer: Self-pay | Admitting: *Deleted

## 2012-08-28 NOTE — Telephone Encounter (Signed)
Pt came to ED 2/25 for swelling in leg.  INR was 2.09  Started on Clindamycin 450mg  tid x 10 days.  LE venous doppler 2/26 negative.  Pt told to continue coumadin 2.5mg  daily except 5mg  on M,W,F and come for INR check on 09/03/12.  Pt verbalized understanding and agreement.

## 2012-08-30 NOTE — ED Provider Notes (Signed)
I saw and evaluated the patient, reviewed the resident's note and I agree with the findings and plan.  66yF w/ RLE pain/swelling. Recently on cephalexin for possible cellulitis and finished w/o much improvement. On exam she has mild swelling and skin changes suggestive of cellulitis. Foot is warm with palpable DP pulse. Hx of PE on coumadin. INR therapeutic. Will try course of clinda and have return to eval for DVT although less likely with INR of 2.1.  Raeford Razor, MD 08/30/12 [phone]

## 2012-09-04 ENCOUNTER — Ambulatory Visit (HOSPITAL_COMMUNITY)
Admission: RE | Admit: 2012-09-04 | Discharge: 2012-09-04 | Disposition: A | Discharge status: Home / Self Care | Payer: Medicare Other | Source: Ambulatory Visit | Attending: Family Medicine | Admitting: Family Medicine

## 2012-09-04 ENCOUNTER — Encounter: Payer: Self-pay | Admitting: Family Medicine

## 2012-09-04 ENCOUNTER — Ambulatory Visit (INDEPENDENT_AMBULATORY_CARE_PROVIDER_SITE_OTHER): Payer: Medicare Other | Admitting: Family Medicine

## 2012-09-04 ENCOUNTER — Telehealth: Payer: Self-pay | Admitting: Family Medicine

## 2012-09-04 VITALS — BP 138/74 | HR 78 | Resp 18 | Ht 65.5 in | Wt 227.0 lb

## 2012-09-04 DIAGNOSIS — E119 Type 2 diabetes mellitus without complications: Secondary | ICD-10-CM

## 2012-09-04 DIAGNOSIS — I10 Essential (primary) hypertension: Secondary | ICD-10-CM

## 2012-09-04 DIAGNOSIS — L97909 Non-pressure chronic ulcer of unspecified part of unspecified lower leg with unspecified severity: Secondary | ICD-10-CM | POA: Insufficient documentation

## 2012-09-04 DIAGNOSIS — L97919 Non-pressure chronic ulcer of unspecified part of right lower leg with unspecified severity: Secondary | ICD-10-CM

## 2012-09-04 DIAGNOSIS — Z7901 Long term (current) use of anticoagulants: Secondary | ICD-10-CM

## 2012-09-04 DIAGNOSIS — M25561 Pain in right knee: Secondary | ICD-10-CM

## 2012-09-04 DIAGNOSIS — M79609 Pain in unspecified limb: Secondary | ICD-10-CM | POA: Insufficient documentation

## 2012-09-04 DIAGNOSIS — E1169 Type 2 diabetes mellitus with other specified complication: Secondary | ICD-10-CM | POA: Insufficient documentation

## 2012-09-04 DIAGNOSIS — E785 Hyperlipidemia, unspecified: Secondary | ICD-10-CM

## 2012-09-04 DIAGNOSIS — M25569 Pain in unspecified knee: Secondary | ICD-10-CM | POA: Insufficient documentation

## 2012-09-04 LAB — COMPLETE METABOLIC PANEL WITH GFR
ALT: 12 U/L (ref 0–35)
AST: 16 U/L (ref 0–37)
Albumin: 4.4 g/dL (ref 3.5–5.2)
Alkaline Phosphatase: 90 U/L (ref 39–117)
Calcium: 9.9 mg/dL (ref 8.4–10.5)
Chloride: 98 mEq/L (ref 96–112)
Potassium: 4.1 mEq/L (ref 3.5–5.3)
Sodium: 140 mEq/L (ref 135–145)
Total Protein: 7.5 g/dL (ref 6.0–8.3)

## 2012-09-04 LAB — CBC WITH DIFFERENTIAL/PLATELET
Basophils Absolute: 0 10*3/uL (ref 0.0–0.1)
Basophils Relative: 0 % (ref 0–1)
HCT: 34.8 % — ABNORMAL LOW (ref 36.0–46.0)
Hemoglobin: 12.3 g/dL (ref 12.0–15.0)
Lymphocytes Relative: 38 % (ref 12–46)
MCHC: 35.3 g/dL (ref 30.0–36.0)
Monocytes Absolute: 0.6 10*3/uL (ref 0.1–1.0)
Neutro Abs: 3.2 10*3/uL (ref 1.7–7.7)
Neutrophils Relative %: 48 % (ref 43–77)
RDW: 14.2 % (ref 11.5–15.5)
WBC: 6.6 10*3/uL (ref 4.0–10.5)

## 2012-09-04 LAB — HEMOGLOBIN A1C
Hgb A1c MFr Bld: 6.4 % — ABNORMAL HIGH (ref <5.7)
Mean Plasma Glucose: 137 mg/dL — ABNORMAL HIGH (ref <117)

## 2012-09-04 LAB — LIPID PANEL
LDL Cholesterol: 94 mg/dL (ref 0–99)
Total CHOL/HDL Ratio: 3.5 Ratio
VLDL: 18 mg/dL (ref 0–40)

## 2012-09-04 LAB — PROTIME-INR: Prothrombin Time: 25.7 seconds — ABNORMAL HIGH (ref 11.6–15.2)

## 2012-09-04 MED ORDER — HYDROCODONE-ACETAMINOPHEN 5-300 MG PO TABS: ORAL_TABLET | ORAL | Status: AC

## 2012-09-04 NOTE — Assessment & Plan Note (Signed)
Updated lab today Hyperlipidemia:Low fat diet discussed and encouraged.   

## 2012-09-04 NOTE — Assessment & Plan Note (Signed)
Non healing ulcer of 10 to 12 week duration, refer to wound center and obtain xray and lab

## 2012-09-04 NOTE — Assessment & Plan Note (Signed)
Controlled, no change in medication DASH diet and commitment to daily physical activity for a minimum of 30 minutes discussed and encouraged, as a part of hypertension management. The importance of attaining a healthy weight is also discussed.  

## 2012-09-04 NOTE — Assessment & Plan Note (Signed)
Updated lab today Patient educated about the importance of limiting  Carbohydrate intake , the need to commit to daily physical activity for a minimum of 30 minutes , and to commit weight loss. The fact that changes in all these areas will reduce or eliminate all together the development of diabetes is stressed.   .  

## 2012-09-04 NOTE — Progress Notes (Signed)
  Subjective:    Patient ID: Stephanie Singleton, female    DOB: July 15, 1945, 67 y.o.   MRN: [id number]  HPI  Mid to end December or early January pt noted scratch on right leg. First seen in Feb in office, then 2 weeks later pt went to ED. Now has ulcer with dark eschar, , enlarging with time, no drainage, significant pain, rated at 8 to 10 in the affected leg Takes chronic meds inconsistently Still no decision on neurosurgery needed  Review of Systems See HPI Denies recent fever or chills. Denies sinus pressure, nasal congestion, ear pain or sore throat. Denies chest congestion, productive cough or wheezing. Denies chest pains, palpitations and leg swelling Denies abdominal pain, nausea, vomiting,diarrhea or constipation.   Denies dysuria, frequency, hesitancy or incontinence. Denies depression, anxiety or insomnia.          Objective:   Physical Exam  Patient alert and oriented and in no cardiopulmonary distress.  HEENT: No facial asymmetry, EOMI, no sinus tenderness,  oropharynx pink and moist.  Neck supple no adenopathy.  Chest: Clear to auscultation bilaterally.  CVS: S1, S2 no murmurs, no S3.  ABD: Soft non tender. Bowel sounds normal.  Ext: No edema  MS: Adequate ROM spine, shoulders, hips and knees.  Skin: hyperpigmented scaling lesion larger than when last seen, diameter approx 2 cm on right anterior leg.No purulent drainage. Surrounding skin hyperemic and tender, not warm.Pt has been applying neosporin liberally.  Psych: Good eye contact, normal affect. Memory intact not anxious or depressed appearing.  CNS: CN 2-12 intact, power, tone and sensation normal throughout.       Assessment & Plan:

## 2012-09-04 NOTE — Telephone Encounter (Signed)
Patient is aware 

## 2012-09-04 NOTE — Patient Instructions (Addendum)
Annual wellness  In 4 month, call if you need me before  You have appintment today at 1pm with Dr Tanda Rockers at the wound center, it is vital you go this is for management of the ulcer on the right leg  Cbc and diff stat,PT/INR stat cmp and EGFR, hBA1C, lipid today  Xraly of right leg today  Please stop neosporin  Vicodin sent in for right leg pain

## 2012-09-08 ENCOUNTER — Telehealth: Payer: Self-pay | Admitting: Family Medicine

## 2012-09-08 MED ORDER — FLUCONAZOLE 150 MG PO TABS: 150.0000 mg | ORAL_TABLET | Freq: Once | ORAL | Status: DC

## 2012-09-08 NOTE — Telephone Encounter (Signed)
Was recently on antibiotic for leg ulcer. Can she have something sent in for yeast infection

## 2012-09-08 NOTE — Telephone Encounter (Signed)
Diflucan sent

## 2012-09-25 ENCOUNTER — Other Ambulatory Visit: Payer: Self-pay | Admitting: Family Medicine

## 2012-10-30 ENCOUNTER — Other Ambulatory Visit: Payer: Self-pay | Admitting: Family Medicine

## 2012-12-03 ENCOUNTER — Ambulatory Visit: Payer: Medicare Other | Admitting: Family Medicine

## 2012-12-04 ENCOUNTER — Telehealth: Payer: Self-pay

## 2012-12-04 NOTE — Telephone Encounter (Signed)
pls send in flucoanzole 150mg  1 tab onl;y and let her know

## 2012-12-05 MED ORDER — FLUCONAZOLE 150 MG PO TABS: 150.0000 mg | ORAL_TABLET | Freq: Once | ORAL | Status: AC

## 2012-12-05 NOTE — Telephone Encounter (Signed)
Med sent.

## 2012-12-05 NOTE — Addendum Note (Signed)
Addended by: Abner Greenspan on: 12/05/2012 07:49 AM   Modules accepted: Orders

## 2012-12-06 ENCOUNTER — Other Ambulatory Visit: Payer: Self-pay | Admitting: Cardiology

## 2012-12-08 NOTE — Telephone Encounter (Signed)
Noted pt has no show/cancelled apt for coumadin since 2-14, spoke with LR and advised to send pt #30 with no refills and instruct pt no more refills until pt calls to schedule apt, also placed a notation on pt refill to advise pt to call to schedule, left vm to advise pt as well

## 2012-12-11 ENCOUNTER — Ambulatory Visit (INDEPENDENT_AMBULATORY_CARE_PROVIDER_SITE_OTHER): Payer: 59 | Admitting: Family Medicine

## 2012-12-11 ENCOUNTER — Ambulatory Visit (INDEPENDENT_AMBULATORY_CARE_PROVIDER_SITE_OTHER): Payer: 59 | Admitting: *Deleted

## 2012-12-11 ENCOUNTER — Encounter: Payer: Self-pay | Admitting: Family Medicine

## 2012-12-11 VITALS — BP 118/72 | HR 82 | Resp 18 | Ht 65.5 in | Wt 218.1 lb

## 2012-12-11 DIAGNOSIS — Z01818 Encounter for other preprocedural examination: Secondary | ICD-10-CM

## 2012-12-11 DIAGNOSIS — I2699 Other pulmonary embolism without acute cor pulmonale: Secondary | ICD-10-CM

## 2012-12-11 DIAGNOSIS — D219 Benign neoplasm of connective and other soft tissue, unspecified: Secondary | ICD-10-CM

## 2012-12-11 DIAGNOSIS — M542 Cervicalgia: Secondary | ICD-10-CM

## 2012-12-11 DIAGNOSIS — E119 Type 2 diabetes mellitus without complications: Secondary | ICD-10-CM

## 2012-12-11 DIAGNOSIS — Z7901 Long term (current) use of anticoagulants: Secondary | ICD-10-CM

## 2012-12-11 DIAGNOSIS — F329 Major depressive disorder, single episode, unspecified: Secondary | ICD-10-CM

## 2012-12-11 DIAGNOSIS — I10 Essential (primary) hypertension: Secondary | ICD-10-CM

## 2012-12-11 NOTE — Patient Instructions (Addendum)
F/u in 5 month, call if you need me before   You will be referred to cardiology for clearance for proposed neck surgery in mid to end August, the coumadin will be addressed through the clinic as per routine    You will be scheduled for a visit with Dr Ovid Curd, based on your decision to operate.  You do NEED handicap sticker and help in the home and a referral will be done, based on severe weakness and wasting of right upper and lower extremities

## 2012-12-11 NOTE — Progress Notes (Signed)
  Subjective:    Patient ID: Stephanie Singleton, female    DOB: 1946/01/07, 67 y.o.   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 Denies sinus pressure, nasal congestion, ear pain or sore throat. Denies chest congestion, productive cough or wheezing. Denies chest pains, palpitations and leg swelling Denies abdominal pain, nausea, vomiting,diarrhea or constipation.   Denies dysuria, frequency, hesitancy or incontinence.  Denies headaches, seizures, numbness, or tingling.  Denies skin break down or rash.        Objective:   Physical Exam  Patient alert and oriented and in no cardiopulmonary distress.Pt in pain  HEENT: No facial asymmetry, EOMI, no sinus tenderness,  oropharynx pink and moist.  Neck decreased ROM, no adenopathy.  Chest: Clear to auscultation bilaterally.  CVS: S1, S2 no murmurs, no S3.  ABD: Soft non tender. Bowel sounds normal.  Ext: No edema  MS: decreased ROM spine,  And right upper and lower  extremities , with obvious wasting of right thenar eminence  Skin: Intact, no ulcerations or rash noted.  Psych: Good eye contact, flat  affect. Memory intact mildly  depressed appearing.  CNS: CN 2-12 intact, power,markedly reduced in right upper and lower extremities      Assessment & Plan:

## 2012-12-13 NOTE — Assessment & Plan Note (Signed)
Controlled, no change in medication  

## 2012-12-13 NOTE — Assessment & Plan Note (Signed)
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  Pt now has decided on surgery, will need cardiology to be involved in bridging coumadin, also to provide clearnce. Neurosrgery will also be alerted to current decision  Needs help with personal care , handicap sticker also

## 2012-12-13 NOTE — Assessment & Plan Note (Signed)
Worsening, continue current meds, but pt has now decided on surgery

## 2012-12-13 NOTE — Assessment & Plan Note (Signed)
Chronic anti coagulation through coumadin clinic

## 2012-12-25 ENCOUNTER — Telehealth: Payer: Self-pay | Admitting: *Deleted

## 2012-12-25 ENCOUNTER — Encounter (INDEPENDENT_AMBULATORY_CARE_PROVIDER_SITE_OTHER): Payer: 59 | Admitting: *Deleted

## 2012-12-25 ENCOUNTER — Encounter: Payer: 59 | Admitting: Adult Health

## 2012-12-25 ENCOUNTER — Emergency Department (HOSPITAL_COMMUNITY)
Admission: EM | Admit: 2012-12-25 | Discharge: 2012-12-25 | Disposition: A | Discharge status: Home / Self Care | Payer: 59

## 2012-12-25 DIAGNOSIS — I2699 Other pulmonary embolism without acute cor pulmonale: Secondary | ICD-10-CM

## 2012-12-25 DIAGNOSIS — Z7901 Long term (current) use of anticoagulants: Secondary | ICD-10-CM

## 2012-12-25 NOTE — ED Notes (Signed)
Pt taken back to room to be triaged.  Pt very agitated and upset stating, "This is not my decision, this is theirs" (refering to [facility]).  States, " I don't need to be here.  There is nothing wrong with me."  Sent here from [facility] for unsteady gait.  Pt states "it happens all the time.  It's because of their carpet."  Denies pain, denies stroke sx.  Requesting to sign out.  Does not want to be seen.  Pt ambulatory with cane to waiting room.  nad noted.

## 2012-12-25 NOTE — Telephone Encounter (Signed)
Pt came into office to have office visit with KL per surgical clearance per anticipated neck surgery per Dr Lodema Hong, pt also scheduled to have Pt/INR checked, noted pt stood up for call back to Pt/INR check with coumadin clinic nurse and almost fell over to the right side, pt grabbed by 2 family members, noted pt swaying while family member was holding her arm and this nurse as well as LR was assessing pt current condition/needs, pt appeared to have lack of use in the right arm, noted wheel chair brought to pt, pt could barely bend her right leg to place foot on chair pedel, bi-lateral eyes noted as reddened, pt recently diagnosed with multiple neurological issues, scheduled to have upcoming MRI to further diagnose concerns, discussion between this nurse, LR RN and KL NP to advise pt current condition, KL NP gave verbal instructions for the pt to be evaluated in the [facility], pt advised instructions, noted reluctant to go to the ED, advised " I think I just tripped on the carpet" reiterated with pt based on current sxs, recent diagnosis that her best option is to be evaluated in the ED to make sure she is ok, pt then advised she will call our [facility] to re-schedule her coumadin check, pt wheeled to the ED via wheelchair with [facility] TS, 2 family members, noted HR 66 and 96% O2 via room air, verbal understanding to go to the ED

## 2012-12-25 NOTE — Progress Notes (Signed)
Cancelled.  

## 2012-12-26 ENCOUNTER — Other Ambulatory Visit: Payer: Self-pay | Admitting: Family Medicine

## 2012-12-29 ENCOUNTER — Encounter: Payer: Self-pay | Admitting: Adult Health

## 2012-12-29 ENCOUNTER — Other Ambulatory Visit: Payer: Self-pay | Admitting: Neurosurgery

## 2012-12-29 DIAGNOSIS — D3611 Benign neoplasm of peripheral nerves and autonomic nervous system of face, head, and neck: Secondary | ICD-10-CM

## 2013-01-06 ENCOUNTER — Other Ambulatory Visit: Payer: Self-pay | Admitting: Neurosurgery

## 2013-01-06 ENCOUNTER — Ambulatory Visit
Admission: RE | Admit: 2013-01-06 | Discharge: 2013-01-06 | Disposition: A | Discharge status: Home / Self Care | Payer: 59 | Source: Ambulatory Visit | Attending: Neurosurgery | Admitting: Neurosurgery

## 2013-01-06 DIAGNOSIS — D3611 Benign neoplasm of peripheral nerves and autonomic nervous system of face, head, and neck: Secondary | ICD-10-CM

## 2013-01-21 ENCOUNTER — Telehealth: Payer: Self-pay | Admitting: *Deleted

## 2013-01-21 NOTE — Telephone Encounter (Signed)
Received incoming call from Darl Pikes with Dr Charlsie Merles office to advise pt is in need of surgery for a resection in her neck per tumor, Dr Charlsie Merles wanted Dr Macarthur Critchley to advise if this pt can stop her blood thinner before her surgery or be admitted for heparin therapy prior to surgery, the surgery has not been set at this point per pending clarification from Dr RR, please advise in absence of Dr Macarthur Critchley

## 2013-01-21 NOTE — Telephone Encounter (Signed)
Records reviewed. Other than being seen in the Coumadin clinic, last office visit was with Ms. Lawrence NP for Dr. Dietrich Pates back in October 2012. At that time the patient was also being considered for surgery, and decision was made that she needed to be bridged with Lovenox while off Coumadin. Based on this, I would expect a similar plan at this time. We will have this coordinated through our Coumadin clinic, copy sent to Dr. Dietrich Pates for his review as well.

## 2013-01-22 NOTE — Telephone Encounter (Signed)
Return office visit for patient to reestablish with a new cardiologist.

## 2013-01-22 NOTE — Telephone Encounter (Signed)
Please advise in absence of LR

## 2013-01-23 NOTE — Telephone Encounter (Signed)
Noted pt insurance code prevents from this nurse making apt for pt, pt information given to [facility] TS to schedule apt for pt, will call pt to advise asap

## 2013-01-26 NOTE — Telephone Encounter (Signed)
Darl Pikes with Dr Kallie Edward office that we are still pending the response from the coumadin nurse concerning pt instructions per noted Dr Macarthur Critchley wants the pt to re-establish with Dr. Purvis Sheffield, .left message to have patient return my call to re-establish care, please advise if any instructions can be given for pt surgery prior to re-establishment

## 2013-01-28 NOTE — Telephone Encounter (Signed)
Pt will have to be seen by MD before clearance can be given.

## 2013-01-30 NOTE — Telephone Encounter (Signed)
Noted pt has upcoming apt for surgical clearance on 02-05-13 with KL at 2:20pm, left message with susan at Dr Charlsie Merles office of update for pt need to be seen by cardiology prior to apt, to call office with any further concerns if neccasary

## 2013-02-05 ENCOUNTER — Encounter: Payer: 59 | Admitting: *Deleted

## 2013-02-05 ENCOUNTER — Ambulatory Visit: Payer: 59 | Admitting: Adult Health

## 2013-02-10 ENCOUNTER — Other Ambulatory Visit: Payer: Self-pay | Admitting: Family Medicine

## 2013-02-12 ENCOUNTER — Encounter: Payer: 59 | Admitting: *Deleted

## 2013-02-12 ENCOUNTER — Ambulatory Visit (INDEPENDENT_AMBULATORY_CARE_PROVIDER_SITE_OTHER): Payer: 59 | Admitting: Adult Health

## 2013-02-12 ENCOUNTER — Encounter: Payer: Self-pay | Admitting: Adult Health

## 2013-02-12 ENCOUNTER — Encounter: Payer: 59 | Admitting: Adult Health

## 2013-02-12 ENCOUNTER — Ambulatory Visit (INDEPENDENT_AMBULATORY_CARE_PROVIDER_SITE_OTHER): Payer: 59 | Admitting: *Deleted

## 2013-02-12 VITALS — BP 151/92 | HR 67 | Ht 66.0 in | Wt 221.8 lb

## 2013-02-12 DIAGNOSIS — I2699 Other pulmonary embolism without acute cor pulmonale: Secondary | ICD-10-CM

## 2013-02-12 DIAGNOSIS — I10 Essential (primary) hypertension: Secondary | ICD-10-CM

## 2013-02-12 DIAGNOSIS — Z7901 Long term (current) use of anticoagulants: Secondary | ICD-10-CM

## 2013-02-12 LAB — POCT INR: INR: 3

## 2013-02-12 NOTE — Progress Notes (Signed)
   HPI: Stephanie Singleton is a 67 year old patient of Dr. Dietrich Pates we are proceeding after a two-year hiatus, with the exception of Coumadin INR checks in the setting of pulmonary emboli. The patient was last seen in the office in October of 2012. She has a history of hypertension and diabetes. 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  Allergies  Allergen Reactions  . Aspirin Nausea And Vomiting    Current Outpatient Prescriptions  Medication Sig Dispense Refill  . doxepin (SINEQUAN) 10 MG capsule Take 30-40 mg by mouth at bedtime as needed. For pain      . gabapentin (NEURONTIN) 100 MG capsule Take 100 mg by mouth 3 (three) times daily. One capsule at bedtime for nerve pain      . Hydrocodone-Acetaminophen 5-300 MG TABS TAKE 1 TABLET TWICE DAILY AS NEEDED FOR RIGHT LEG PAIN  30 each  0  . lansoprazole (PREVACID) 30 MG capsule Take 30 mg by mouth daily.        Marland Kitchen lisinopril-hydrochlorothiazide (PRINZIDE,ZESTORETIC) 20-25 MG per tablet Take 1.5 tablets by mouth daily.       . metFORMIN (GLUCOPHAGE) 500 MG tablet Take 500 mg by mouth daily.       . potassium chloride SA (K-DUR,KLOR-CON) 20 MEQ tablet Take 20 mEq by mouth 2 (two) times a week. *Take only on the days that Demadex (TORSEMIDE) is taken*      . potassium chloride SA (KLOR-CON M20) 20 MEQ tablet TAKE 1 TABLET BY MOUTH TWO TIMES A DAY ON THE DAYS THAT YOU TAKE THE LASIX, MAX OF 3 TIMES PER WEEK  24 tablet  1  . pravastatin (PRAVACHOL) 20 MG tablet Take 20 mg by mouth every morning.      . torsemide (DEMADEX) 20 MG tablet Take 1 tablet 2 times a week as needed for swelling  8 tablet  1  . warfarin (COUMADIN) 5 MG tablet Take 2.5-5 mg by mouth as directed. TAKE 1 TABLET ON MWF THEN TAKE 1/2 TABLET THE REST OF THE WEEK.      . warfarin (COUMADIN) 5 MG tablet TAKE 1 TABLET ON MONDAYS AND THURSDAYS AND 1/2 TABLET THE REST OF WEEK  30 tablet  0   No current facility-administered  medications for this visit.    Past Medical History  Diagnosis Date  . Tumor of soft tissue of neck   . Embolism - blood clot 2004    Pulmonary .on coumadin  . OSA (obstructive sleep apnea) DX 8 YRS AGO.DOES NOT WEAR MASK  . Diabetes mellitus DX X 1 YR    USE METFORMIN ACCORDING TO CBG RESULT  . GERD (gastroesophageal reflux disease)     ON PREVACID  . 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 Hypertension     CARDIOLOGY VISIT.CARDIAC CLEARANCE.LOVENOX INSTRUCTIONS.  Marland Kitchen HTN (hypertension)     EKG 10/23 IN EPIC    No past surgical history on file.  ROS: PHYSICAL EXAM There were no vitals taken for this visit.  EKG:  ASSESSMENT AND PLAN

## 2013-02-12 NOTE — Assessment & Plan Note (Signed)
She has had longstanding hypertension and has had issues of medical non-compliance. She will have an echocardiogram completed for evaluation of LV fx. If abnormal,may consider stress test. 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 Cardiac clearance and suggestions concerning coumadin will be made if/when surgery is scheduled. She is advised to be more compliant with her mediations.

## 2013-02-12 NOTE — Patient Instructions (Signed)
Your physician recommends that you schedule a follow-up appointment in: Once surgery has been scheduled.  Your physician has requested that you have an echocardiogram. Echocardiography is a painless test that uses sound waves to create images of your heart. It provides your doctor with information about the size and shape of your heart and how well your heart's chambers and valves are working. This procedure takes approximately one hour. There are no restrictions for this procedure.  Marland Kitchen

## 2013-02-12 NOTE — Assessment & Plan Note (Signed)
She will continue current medical regimen of coumadin therapy. This is dosed in our coumadin clinic in the [place] office. Should surgery be scheduled, she will have recommendations for dosing and LMWH bridging.

## 2013-02-12 NOTE — Progress Notes (Signed)
HPI: Stephanie Singleton is a 67 year old patient of Dr. Dietrich Pates we are following for ongoing assessment and management of hypertension, history of pulmonary embolism on chronic Coumadin therapy, who has been lost to followup since October 2012. On the last visit the patient was evaluated preoperatively for a cervical nerve sheath tumor to be removed. She never had the surgery. She is back today considering having the surgery again is due to see a Dr. Franky Macho interim for second opinion to have the surgery. The patient comes today mildly hypertensive having forgotten to take her blood pressure pill for the last couple of days. She is followed in our [place] office Coumadin clinic. She denies chest pain shortness of breath dizziness. She is chronic lower extremity pain.  Allergies  Allergen Reactions  . Aspirin Nausea And Vomiting    Current Outpatient Prescriptions  Medication Sig Dispense Refill  . doxepin (SINEQUAN) 10 MG capsule Take 30-40 mg by mouth at bedtime as needed. For pain      . fluconazole (DIFLUCAN) 150 MG tablet       . gabapentin (NEURONTIN) 100 MG capsule Take 100 mg by mouth 3 (three) times daily. One capsule at bedtime for nerve pain      . Hydrocodone-Acetaminophen 5-300 MG TABS TAKE 1 TABLET TWICE DAILY AS NEEDED FOR RIGHT LEG PAIN  30 each  0  . lansoprazole (PREVACID) 30 MG capsule Take 30 mg by mouth daily.        Marland Kitchen lisinopril-hydrochlorothiazide (PRINZIDE,ZESTORETIC) 20-25 MG per tablet Take 1.5 tablets by mouth daily.       . metFORMIN (GLUCOPHAGE) 500 MG tablet Take 500 mg by mouth daily.       . potassium chloride SA (K-DUR,KLOR-CON) 20 MEQ tablet Take 20 mEq by mouth 2 (two) times a week. *Take only on the days that Demadex (TORSEMIDE) is taken*      . pravastatin (PRAVACHOL) 20 MG tablet Take 20 mg by mouth every morning.      . torsemide (DEMADEX) 20 MG tablet Take 1 tablet 2 times a week as needed for swelling  8 tablet  1  . warfarin (COUMADIN) 5 MG tablet Take  2.5-5 mg by mouth as directed. TAKE 1 TABLET ON MWF THEN TAKE 1/2 TABLET THE REST OF THE WEEK.      . warfarin (COUMADIN) 5 MG tablet TAKE 1 TABLET ON MONDAYS AND THURSDAYS AND 1/2 TABLET THE REST OF WEEK  30 tablet  0   No current facility-administered medications for this visit.    Past Medical History  Diagnosis Date  . Tumor of soft tissue of neck   . Embolism - blood clot 2004    Pulmonary .on coumadin  . OSA (obstructive sleep apnea) DX 8 YRS AGO.DOES NOT WEAR MASK  . Diabetes mellitus DX X 1 YR    USE METFORMIN ACCORDING TO CBG RESULT  . GERD (gastroesophageal reflux disease)     ON PREVACID  . 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 Hypertension     CARDIOLOGY VISIT.CARDIAC CLEARANCE.LOVENOX INSTRUCTIONS.  Marland Kitchen HTN (hypertension)     EKG 10/23 IN EPIC    History reviewed. No pertinent past surgical history.  Family History  Problem Relation Age of Onset  . Heart disease Mother   . Hypertension Mother   . Stroke Mother   . Diabetes Father   . Peripheral vascular disease Father   . Peripheral vascular disease Brother   .  Heart disease Brother     History   Social History  . Marital Status: Divorced    Spouse Name: N/A    Number of Children: N/A  . Years of Education: N/A   Occupational History  .      Pastor   Social History Main Topics  . Smoking status: Former Games developer  . Smokeless tobacco: Not on file  . Alcohol Use: No  . Drug Use: No  . Sexual Activity: No   Other Topics Concern  . Not on file   Social History Narrative  . No narrative on file    JYN:WGNFAO of systems complete and found to be negative unless listed above   PHYSICAL EXAM BP 151/92  Pulse 67  Ht 5\' 6"  (1.676 m)  Wt 221 lb 12 oz (100.585 kg)  BMI 35.81 kg/m2  General: Well developed, well nourished, in no acute distress Head: Eyes PERRLA, No xanthomas.   Normal cephalic and atramatic  Lungs: Clear bilaterally to  auscultation and percussion. Heart: HRRR S1 S2, without MRG.  Pulses are 2+ & equal.            No carotid bruit. No JVD.  No abdominal bruits. No femoral bruits. Abdomen: Bowel sounds are positive, abdomen soft and non-tender without masses or                  Hernia's noted. Msk:  Back normal, normal gait. Diminished strength on the right leg with mild non-pitting edema. Extremities: No clubbing, cyanosis or edema.  DP +1 Neuro: Alert and oriented X 3. Psych:  Good affect, responds appropriately    ASSESSMENT AND PLAN

## 2013-02-12 NOTE — Progress Notes (Deleted)
Name: Stephanie Singleton    DOB: 10-21-1945  Age: 67 y.o.  MR#: [id number]       PCP:  Syliva Overman, MD      Insurance: Payor: Cleatrice Burke / Plan: Cleatrice Burke / Product Type: *No Product type* /   CC:   No chief complaint on file.   VS There were no vitals filed for this visit.  Weights Current Weight  12/11/12 218 lb 1.3 oz (98.92 kg)  09/04/12 227 lb (102.967 kg)  08/26/12 222 lb (100.699 kg)    Blood Pressure  BP Readings from Last 3 Encounters:  12/11/12 118/72  09/04/12 138/74  08/26/12 142/92     Admit date:  (Not on file) Last encounter with [facility]:  Visit date not found   Allergy Aspirin  Current Outpatient Prescriptions  Medication Sig Dispense Refill  . doxepin (SINEQUAN) 10 MG capsule Take 30-40 mg by mouth at bedtime as needed. For pain      . gabapentin (NEURONTIN) 100 MG capsule Take 100 mg by mouth 3 (three) times daily. One capsule at bedtime for nerve pain      . Hydrocodone-Acetaminophen 5-300 MG TABS TAKE 1 TABLET TWICE DAILY AS NEEDED FOR RIGHT LEG PAIN  30 each  0  . lansoprazole (PREVACID) 30 MG capsule Take 30 mg by mouth daily.        Marland Kitchen lisinopril-hydrochlorothiazide (PRINZIDE,ZESTORETIC) 20-25 MG per tablet Take 1.5 tablets by mouth daily.       . metFORMIN (GLUCOPHAGE) 500 MG tablet Take 500 mg by mouth daily.       . potassium chloride SA (K-DUR,KLOR-CON) 20 MEQ tablet Take 20 mEq by mouth 2 (two) times a week. *Take only on the days that Demadex (TORSEMIDE) is taken*      . potassium chloride SA (KLOR-CON M20) 20 MEQ tablet TAKE 1 TABLET BY MOUTH TWO TIMES A DAY ON THE DAYS THAT YOU TAKE THE LASIX, MAX OF 3 TIMES PER WEEK  24 tablet  1  . pravastatin (PRAVACHOL) 20 MG tablet Take 20 mg by mouth every morning.      . torsemide (DEMADEX) 20 MG tablet Take 1 tablet 2 times a week as needed for swelling  8 tablet  1  . warfarin (COUMADIN) 5 MG tablet Take 2.5-5 mg by mouth as directed. TAKE 1 TABLET ON MWF THEN TAKE 1/2 TABLET THE REST OF THE  WEEK.      . warfarin (COUMADIN) 5 MG tablet TAKE 1 TABLET ON MONDAYS AND THURSDAYS AND 1/2 TABLET THE REST OF WEEK  30 tablet  0   No current facility-administered medications for this visit.    Discontinued Meds:   There are no discontinued medications.  Patient Active Problem List   Diagnosis Date Noted  . Ulcer of leg, chronic, right 09/04/2012  . Pain in joint, lower leg 09/04/2012  . Hyperlipidemia LDL goal < 100 07/28/2011  . Preoperative cardiovascular examination 04/24/2011  . Carotid bruit 01/22/2011  . Back pain, thoracic 01/22/2011  . Long term current use of anticoagulant 09/27/2010  . EPISTAXIS, RECURRENT 07/19/2010  . SHOULDER PAIN, RIGHT 07/19/2010  . DIABETES MELLITUS, TYPE II 03/14/2010  . NECK PAIN 01/15/2010  . NEOPLASMS UNSPEC NATURE BONE SOFT TISSUE&SKIN 05/27/2009  . DYSPNEA 05/27/2009  . SLEEP APNEA 05/17/2009  . DEPRESSION 08/26/2008  . PVD 08/26/2008  . HEADACHE 08/26/2008  . TIA 05/02/2008  . 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 04/12/2008  . NARCOLEPSY WITHOUT CATAPLEXY 04/12/2008  . PULMONARY EMBOLISM  04/12/2008  . Other Malaise and Fatigue 04/12/2008  . NEUROFIBROMA 01/13/2008  . OBESITY 01/13/2008  . HYPERTENSION 01/13/2008  . GERD 01/13/2008  . LEG PAIN, BILATERAL 01/13/2008    LABS    Component Value Date/Time   NA 140 09/04/2012 1020   NA 141 06/03/2012 1520   NA 137 07/24/2011 1558   K 4.1 09/04/2012 1020   K 4.0 06/03/2012 1520   K 3.8 07/24/2011 1558   CL 98 09/04/2012 1020   CL 101 06/03/2012 1520   CL 100 07/24/2011 1558   CO2 26 09/04/2012 1020   CO2 29 06/03/2012 1520   CO2 27 07/24/2011 1558   GLUCOSE 99 09/04/2012 1020   GLUCOSE 98 06/03/2012 1520   GLUCOSE 89 07/24/2011 1558   BUN 18 09/04/2012 1020   BUN 15 06/03/2012 1520   BUN 12 07/24/2011 1558   CREATININE 0.91 09/04/2012 1020   CREATININE 0.88 06/03/2012 1520   CREATININE 0.85 07/24/2011 1558   CREATININE 0.85 05/01/2011 1548   CREATININE 0.96 07/20/2010 0209    CREATININE 1.08 01/11/2010 0607   CALCIUM 9.9 09/04/2012 1020   CALCIUM 9.9 06/03/2012 1520   CALCIUM 9.9 07/24/2011 1558   GFRNONAA 71* 05/01/2011 1548   GFRNONAA >60 12/24/2008 2229   GFRNONAA >60 04/23/2008 1000   GFRAA 83* 05/01/2011 1548   GFRAA  Value: >60        The eGFR has been calculated using the MDRD equation. This calculation has not been validated in all clinical situations. eGFR's persistently <60 mL/min signify possible Chronic Kidney Disease. 12/24/2008 2229   GFRAA  Value: >60        The eGFR has been calculated using the MDRD equation. This calculation has not been validated in all clinical 04/23/2008 1000   CMP     Component Value Date/Time   NA 140 09/04/2012 1020   K 4.1 09/04/2012 1020   CL 98 09/04/2012 1020   CO2 26 09/04/2012 1020   GLUCOSE 99 09/04/2012 1020   BUN 18 09/04/2012 1020   CREATININE 0.91 09/04/2012 1020   CREATININE 0.85 05/01/2011 1548   CALCIUM 9.9 09/04/2012 1020   PROT 7.5 09/04/2012 1020   ALBUMIN 4.4 09/04/2012 1020   AST 16 09/04/2012 1020   ALT 12 09/04/2012 1020   ALKPHOS 90 09/04/2012 1020   BILITOT 0.4 09/04/2012 1020   GFRNONAA 71* 05/01/2011 1548   GFRAA 83* 05/01/2011 1548       Component Value Date/Time   WBC 6.6 09/04/2012 1020   WBC 7.5 05/01/2011 1548   WBC 6.8 01/16/2011 2143   HGB 12.3 09/04/2012 1020   HGB 13.9 05/01/2011 1548   HGB 12.6 01/16/2011 2143   HCT 34.8* 09/04/2012 1020   HCT 40.5 05/01/2011 1548   HCT 36.3 01/16/2011 2143   MCV 76.0* 09/04/2012 1020   MCV 80.7 05/01/2011 1548   MCV 77.6* 01/16/2011 2143    Lipid Panel     Component Value Date/Time   CHOL 157 09/04/2012 1020   TRIG 88 09/04/2012 1020   HDL 45 09/04/2012 1020   CHOLHDL 3.5 09/04/2012 1020   VLDL 18 09/04/2012 1020   LDLCALC 94 09/04/2012 1020    ABG No results found for this basename: phart, pco2, pco2art, po2, po2art, hco3, tco2, acidbasedef, o2sat     Lab Results  Component Value Date   TSH 0.625 06/03/2012   BNP (last 3 results) No results found for this basename:  PROBNP,  in the last 8760 hours Cardiac Panel (  last 3 results) No results found for this basename: CKTOTAL, CKMB, TROPONINI, RELINDX,  in the last 72 hours  Iron/TIBC/Ferritin No results found for this basename: iron, tibc, ferritin     EKG Orders placed during the hospital encounter of 05/01/11  . EKG 12-LEAD  . EKG 12-LEAD     Prior Assessment and Plan Problem List as of 02/12/2013     Cardiovascular and Mediastinum   HYPERTENSION   Last Assessment & Plan   12/11/2012 Office Visit Written 12/13/2012  2:47 PM by Kerri Perches, MD     Controlled, no change in medication     PULMONARY EMBOLISM   Last Assessment & Plan   12/11/2012 Office Visit Written 12/13/2012  2:48 PM by Kerri Perches, MD     Chronic anti coagulation through coumadin clinic    TIA   PVD   EPISTAXIS, RECURRENT     Digestive   GERD     Endocrine   DIABETES MELLITUS, TYPE II   Last Assessment & Plan   12/11/2012 Office Visit Written 12/13/2012  2:47 PM by Kerri Perches, MD     Controlled, no change in medication       Other   NEUROFIBROMA   Last Assessment & Plan   12/11/2012 Office Visit Written 12/13/2012  2:46 PM by Kerri Perches, MD     Significant  deterioration in ability to function, 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  Pt now has decided on surgery, will need cardiology to be involved in bridging coumadin, also to provide clearnce. Neurosrgery will also be alerted to current decision  Needs help with personal care , handicap sticker also    NEOPLASMS UNSPEC NATURE BONE SOFT TISSUE&SKIN   OBESITY   Last Assessment & Plan   08/12/2012 Office Visit Written 08/13/2012  5:28 PM by Kerri Perches, MD     Deteriorated. Patient re-educated about  the importance of commitment to a  minimum of 150 minutes of exercise per week. The importance of healthy food choices with portion control discussed. Encouraged to start a food diary, count calories and to consider  joining a support  group. Sample diet sheets offered. Goals set by the patient for the next several months.       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   DEPRESSION   Last Assessment & Plan   12/11/2012 Office Visit Written 12/13/2012  2:47 PM by Kerri Perches, MD     Controlled, no change in medication     NARCOLEPSY WITHOUT CATAPLEXY   NECK PAIN   Last Assessment & Plan   12/11/2012 Office Visit Written 12/13/2012  2:48 PM by Kerri Perches, MD     Worsening, continue current meds, but pt has now decided on surgery    LEG PAIN, BILATERAL   SLEEP APNEA   Other Malaise and Fatigue   HEADACHE   DYSPNEA   SHOULDER PAIN, RIGHT   Last Assessment & Plan   06/05/2012 Office Visit Written 06/08/2012  6:48 PM by Kerri Perches, MD     Deteriorated will refer for ortho re eval    Long term current use of anticoagulant   Carotid bruit   Last Assessment & Plan   01/18/2011 Office Visit Written 01/22/2011  5:54 PM by Syliva Overman, MD     Lightheadedness and bruit, carotid doppler to eval for obstruction    Back pain, thoracic   Preoperative cardiovascular examination   Last Assessment & Plan  04/24/2011 Office Visit Written 04/24/2011  7:48 PM by Jodelle Gross, NP     The patient has been seen and examined by Dr. Dietrich Pates and myself.  She is stable from cardiac standpoint. Vital signs and EKG are reviewed. Recommend proceeding with planned surgery with anticoagulation as follows:  One week prior to surgery stop coumadin and begin lovenox 40 mg SQ daily.  2 days prior to surgery stop lovenox. Begin coumadin again ASAP post-operatively when surgeon is agreeable to restart anticoagulation. She is to follow-up with coumadin clinic as soon as possible after surgery. Follow-up appointment with Dr. Dietrich Pates one month after surgery.  Her daughter is an LPN and will assist her with injections.  This has been explained to her by Dr. Dietrich Pates.  She is anxious about the procedure and the  anticoagulation using lovenox, but is willing to proceed. She is advised to call for any questions.    Hyperlipidemia LDL goal < 100   Last Assessment & Plan   09/04/2012 Office Visit Written 09/04/2012  3:23 PM by Kerri Perches, MD     Updated lab today. Hyperlipidemia:Low fat diet discussed and encouraged.      Ulcer of leg, chronic, right   Last Assessment & Plan   09/04/2012 Office Visit Written 09/04/2012  3:23 PM by Kerri Perches, MD     Non healing ulcer of 10 to 12 week duration, refer to wound center and obtain xray and lab    Pain in joint, lower leg       Imaging: No results found.

## 2013-02-13 ENCOUNTER — Other Ambulatory Visit: Payer: Self-pay | Admitting: *Deleted

## 2013-02-13 ENCOUNTER — Ambulatory Visit: Payer: 59 | Admitting: Adult Health

## 2013-02-13 NOTE — Addendum Note (Signed)
Addended by: Thompson Grayer on: 02/13/2013 11:17 AM   Modules accepted: Orders

## 2013-02-19 ENCOUNTER — Other Ambulatory Visit: Payer: Self-pay | Admitting: Family Medicine

## 2013-02-20 ENCOUNTER — Other Ambulatory Visit (HOSPITAL_COMMUNITY): Payer: 59

## 2013-02-26 ENCOUNTER — Ambulatory Visit (HOSPITAL_COMMUNITY)
Admission: RE | Admit: 2013-02-26 | Discharge: 2013-02-26 | Disposition: A | Discharge status: Home / Self Care | Payer: 59 | Source: Ambulatory Visit | Attending: Adult Health | Admitting: Adult Health

## 2013-02-26 ENCOUNTER — Ambulatory Visit (INDEPENDENT_AMBULATORY_CARE_PROVIDER_SITE_OTHER): Payer: 59 | Admitting: *Deleted

## 2013-02-26 DIAGNOSIS — G459 Transient cerebral ischemic attack, unspecified: Secondary | ICD-10-CM | POA: Insufficient documentation

## 2013-02-26 DIAGNOSIS — R0609 Other forms of dyspnea: Secondary | ICD-10-CM | POA: Insufficient documentation

## 2013-02-26 DIAGNOSIS — I517 Cardiomegaly: Secondary | ICD-10-CM

## 2013-02-26 DIAGNOSIS — E119 Type 2 diabetes mellitus without complications: Secondary | ICD-10-CM | POA: Insufficient documentation

## 2013-02-26 DIAGNOSIS — R0989 Other specified symptoms and signs involving the circulatory and respiratory systems: Secondary | ICD-10-CM | POA: Insufficient documentation

## 2013-02-26 DIAGNOSIS — I2699 Other pulmonary embolism without acute cor pulmonale: Secondary | ICD-10-CM

## 2013-02-26 DIAGNOSIS — I10 Essential (primary) hypertension: Secondary | ICD-10-CM

## 2013-02-26 DIAGNOSIS — Z7901 Long term (current) use of anticoagulants: Secondary | ICD-10-CM

## 2013-02-26 NOTE — Progress Notes (Signed)
*  PRELIMINARY RESULTS* Echocardiogram 2D Echocardiogram has been performed.  Conrad Clyman 02/26/2013, 3:39 PM

## 2013-03-01 ENCOUNTER — Other Ambulatory Visit: Payer: Self-pay | Admitting: Family Medicine

## 2013-03-13 ENCOUNTER — Telehealth: Payer: Self-pay | Admitting: Family Medicine

## 2013-03-18 NOTE — Telephone Encounter (Signed)
Please advise does patient need to been seen in the office prior to having this.  Or can I offer pt?

## 2013-03-19 NOTE — Telephone Encounter (Signed)
Patient is asking for a lift recliner that she can sleep in instead of bed because of problems with mobility.

## 2013-03-19 NOTE — Telephone Encounter (Signed)
She needs Ov, has not been in since early March is diabetic, HTN and high cholesterol. Needs fasting lipid, cmp and EGFr hBA1C and vit D if even on the day of the visit. Has appt in coumadin clinic 9/25, she can see me on that day as well, discuss with her and give appt.pls She needs visit with specific attention to request for equipment she is requesting that I know she  needs

## 2013-03-23 ENCOUNTER — Other Ambulatory Visit: Payer: Self-pay | Admitting: Adult Health

## 2013-03-24 ENCOUNTER — Telehealth: Payer: Self-pay | Admitting: *Deleted

## 2013-03-24 NOTE — Telephone Encounter (Signed)
Hold for 3 days prior to planned surgery and restart ASAP post-operatively.

## 2013-03-24 NOTE — Telephone Encounter (Signed)
Please advise 

## 2013-03-24 NOTE — Telephone Encounter (Signed)
Spoke to patient concerning lab/test results/instructions from provider. Patient understood.    

## 2013-03-24 NOTE — Telephone Encounter (Signed)
Patient scheduled for 9/25

## 2013-03-24 NOTE — Telephone Encounter (Signed)
[facility] NEED TO KNOW WHEN PATIENT IS TO STOP COUMADIN FOR SPINAL SURGERY SCHEDULED IN October.

## 2013-03-26 ENCOUNTER — Ambulatory Visit (INDEPENDENT_AMBULATORY_CARE_PROVIDER_SITE_OTHER): Payer: 59 | Admitting: *Deleted

## 2013-03-26 ENCOUNTER — Encounter: Payer: Self-pay | Admitting: Family Medicine

## 2013-03-26 ENCOUNTER — Ambulatory Visit (INDEPENDENT_AMBULATORY_CARE_PROVIDER_SITE_OTHER): Payer: 59 | Admitting: Family Medicine

## 2013-03-26 VITALS — BP 126/80 | HR 70 | Resp 18 | Ht 65.5 in | Wt 220.1 lb

## 2013-03-26 DIAGNOSIS — E785 Hyperlipidemia, unspecified: Secondary | ICD-10-CM

## 2013-03-26 DIAGNOSIS — Z9181 History of falling: Secondary | ICD-10-CM

## 2013-03-26 DIAGNOSIS — I10 Essential (primary) hypertension: Secondary | ICD-10-CM

## 2013-03-26 DIAGNOSIS — G8190 Hemiplegia, unspecified affecting unspecified side: Secondary | ICD-10-CM

## 2013-03-26 DIAGNOSIS — E669 Obesity, unspecified: Secondary | ICD-10-CM

## 2013-03-26 DIAGNOSIS — E119 Type 2 diabetes mellitus without complications: Secondary | ICD-10-CM

## 2013-03-26 DIAGNOSIS — Z7901 Long term (current) use of anticoagulants: Secondary | ICD-10-CM

## 2013-03-26 DIAGNOSIS — I2699 Other pulmonary embolism without acute cor pulmonale: Secondary | ICD-10-CM

## 2013-03-26 NOTE — Progress Notes (Signed)
  Subjective:    Patient ID: Stephanie Singleton, female    DOB: [birth year], 67 y.o.   MRN: [id number]  HPI  Pt in today stating hving surgery at [facility] October 3, met the surgeon 2 weeks ago Needs a recliner chair with a lift, available at [facility], also a shower chair, due to very poor mobility, inability to perform ADL's without assistance and high fall risk She continues to refuse all immunization and also cncer screening tests. Is positive about surguical outcome , though itr has taken her years to get to this point  Review of Systems See HPI Denies recent fever or chills. Denies sinus pressure, nasal congestion, ear pain or sore throat. Denies chest congestion, productive cough or wheezing. Denies chest pains, palpitations and leg swelling Denies abdominal pain, nausea, vomiting,diarrhea or constipation.   Denies dysuria, frequency, hesitancy or incontinence.  Denies depression, anxiety or insomnia. Denies skin break down or rash.        Objective:   Physical Exam  Patient alert and oriented and in no cardiopulmonary distress.  HEENT: No facial asymmetry, EOMI, no sinus tenderness,  oropharynx pink and moist.  Neck adequate ROM no adenopathy.  Chest: Clear to auscultation bilaterally.  CVS: S1, S2 no murmurs, no S3.  ABD: Soft non tender. Bowel sounds normal.  Ext: No edema  MS: decreased  ROM spine, shoulders, hips and knees.  Skin: Intact, no ulcerations or rash noted.  Psych: Good eye contact, normal affect. Memory intact not anxious or depressed appearing.  CNS: CN 2-12 intact, power, decreased to grade 2 in rigth upper and lowerextremities      Assessment & Plan:  ,

## 2013-03-26 NOTE — Patient Instructions (Addendum)
F/u in mid to end January, call if you need me before  Flu vaccine  Needed, please reconsider  All the very best with your surgery, you absolutely needs this   Script will be sent for shower chair , and recliner  Chair to [facility] in [place] today

## 2013-03-28 NOTE — Assessment & Plan Note (Signed)
Hyperlipidemia:Low fat diet discussed and encouraged.  Update lab from [facility] next week

## 2013-03-28 NOTE — Assessment & Plan Note (Signed)
Controlled, no change in medication  

## 2013-03-28 NOTE — Assessment & Plan Note (Signed)
Worsening right hemiplegia puts pt at high fall risk

## 2013-03-28 NOTE — Assessment & Plan Note (Signed)
Severe , pt has progressively lost power in right upper and lower extremities, is a high fall risk and needs assistance with ADL's currently. All this is due to progressively enlarging schwannoma which she elected o "watch" for at least 5 years, she has been followed b y a neurosurgeon all this time also.  Now will have surgery at [facility] in the next 2 to 3 weeks Requests shower chair as well as recliner lift chair due to progressive deterioration to safely move or care for herself

## 2013-03-28 NOTE — Assessment & Plan Note (Signed)
Updated lab needed. Pt has pre op labs at [facility] next week in preparation for surgery, and will have this evaluated at that time. She is encouraged to keep sweets and carbs down to improve blood sugar control Needs all basic evaluations of her DM done , she is inconsistent in office visits, has progresively worsening limitation in mobility, and refuses all immunizations and most cancer screening evaluations

## 2013-03-28 NOTE — Assessment & Plan Note (Signed)
Deteriorated.  

## 2013-03-31 ENCOUNTER — Telehealth: Payer: Self-pay | Admitting: Family Medicine

## 2013-03-31 NOTE — Telephone Encounter (Signed)
Script written  ( I think she got a script before also)

## 2013-04-01 NOTE — Telephone Encounter (Signed)
rx up front to be collected

## 2013-04-17 ENCOUNTER — Telehealth: Payer: Self-pay | Admitting: *Deleted

## 2013-04-17 NOTE — Telephone Encounter (Signed)
ERROR PT DOES NOT WANT A CALL BACK

## 2013-04-20 ENCOUNTER — Ambulatory Visit (INDEPENDENT_AMBULATORY_CARE_PROVIDER_SITE_OTHER): Payer: 59 | Admitting: *Deleted

## 2013-04-20 DIAGNOSIS — I2699 Other pulmonary embolism without acute cor pulmonale: Secondary | ICD-10-CM

## 2013-04-20 DIAGNOSIS — Z7901 Long term (current) use of anticoagulants: Secondary | ICD-10-CM

## 2013-04-30 ENCOUNTER — Ambulatory Visit (INDEPENDENT_AMBULATORY_CARE_PROVIDER_SITE_OTHER): Payer: 59 | Admitting: *Deleted

## 2013-04-30 DIAGNOSIS — I2699 Other pulmonary embolism without acute cor pulmonale: Secondary | ICD-10-CM

## 2013-04-30 DIAGNOSIS — Z7901 Long term (current) use of anticoagulants: Secondary | ICD-10-CM

## 2013-05-13 ENCOUNTER — Ambulatory Visit: Payer: 59 | Admitting: Family Medicine

## 2013-06-11 ENCOUNTER — Other Ambulatory Visit: Payer: Self-pay | Admitting: Adult Health

## 2013-06-11 ENCOUNTER — Other Ambulatory Visit: Payer: Self-pay | Admitting: Family Medicine

## 2013-06-14 ENCOUNTER — Other Ambulatory Visit: Payer: Self-pay | Admitting: Family Medicine

## 2013-07-10 ENCOUNTER — Other Ambulatory Visit: Payer: Self-pay | Admitting: Family Medicine

## 2013-07-14 ENCOUNTER — Encounter: Payer: Self-pay | Admitting: Family Medicine

## 2013-07-14 ENCOUNTER — Ambulatory Visit (INDEPENDENT_AMBULATORY_CARE_PROVIDER_SITE_OTHER): Payer: 59 | Admitting: Family Medicine

## 2013-07-14 ENCOUNTER — Encounter (INDEPENDENT_AMBULATORY_CARE_PROVIDER_SITE_OTHER): Payer: Self-pay

## 2013-07-14 VITALS — BP 130/80 | HR 63 | Resp 16 | Ht 66.0 in | Wt 213.4 lb

## 2013-07-14 DIAGNOSIS — G8190 Hemiplegia, unspecified affecting unspecified side: Secondary | ICD-10-CM

## 2013-07-14 DIAGNOSIS — M949 Disorder of cartilage, unspecified: Secondary | ICD-10-CM

## 2013-07-14 DIAGNOSIS — R5381 Other malaise: Secondary | ICD-10-CM

## 2013-07-14 DIAGNOSIS — R5383 Other fatigue: Secondary | ICD-10-CM

## 2013-07-14 DIAGNOSIS — E785 Hyperlipidemia, unspecified: Secondary | ICD-10-CM

## 2013-07-14 DIAGNOSIS — E669 Obesity, unspecified: Secondary | ICD-10-CM

## 2013-07-14 DIAGNOSIS — E119 Type 2 diabetes mellitus without complications: Secondary | ICD-10-CM

## 2013-07-14 DIAGNOSIS — M542 Cervicalgia: Secondary | ICD-10-CM

## 2013-07-14 DIAGNOSIS — E559 Vitamin D deficiency, unspecified: Secondary | ICD-10-CM

## 2013-07-14 DIAGNOSIS — D219 Benign neoplasm of connective and other soft tissue, unspecified: Secondary | ICD-10-CM

## 2013-07-14 DIAGNOSIS — M899 Disorder of bone, unspecified: Secondary | ICD-10-CM

## 2013-07-14 DIAGNOSIS — I10 Essential (primary) hypertension: Secondary | ICD-10-CM

## 2013-07-14 LAB — COMPLETE METABOLIC PANEL WITH GFR
ALT: 9 U/L (ref 0–35)
AST: 16 U/L (ref 0–37)
Albumin: 4.3 g/dL (ref 3.5–5.2)
Alkaline Phosphatase: 95 U/L (ref 39–117)
BUN: 18 mg/dL (ref 6–23)
CALCIUM: 10.1 mg/dL (ref 8.4–10.5)
CHLORIDE: 100 meq/L (ref 96–112)
CO2: 27 meq/L (ref 19–32)
CREATININE: 1 mg/dL (ref 0.50–1.10)
GFR, EST AFRICAN AMERICAN: 68 mL/min
GFR, Est Non African American: 59 mL/min — ABNORMAL LOW
Glucose, Bld: 108 mg/dL — ABNORMAL HIGH (ref 70–99)
POTASSIUM: 3.9 meq/L (ref 3.5–5.3)
Sodium: 138 mEq/L (ref 135–145)
Total Bilirubin: 0.5 mg/dL (ref 0.3–1.2)
Total Protein: 8.1 g/dL (ref 6.0–8.3)

## 2013-07-14 LAB — CBC WITH DIFFERENTIAL/PLATELET
BASOS ABS: 0 10*3/uL (ref 0.0–0.1)
Basophils Relative: 0 % (ref 0–1)
EOS ABS: 0.1 10*3/uL (ref 0.0–0.7)
EOS PCT: 2 % (ref 0–5)
HCT: 37.8 % (ref 36.0–46.0)
Hemoglobin: 12.7 g/dL (ref 12.0–15.0)
Lymphocytes Relative: 32 % (ref 12–46)
Lymphs Abs: 2.4 10*3/uL (ref 0.7–4.0)
MCH: 24.7 pg — AB (ref 26.0–34.0)
MCHC: 33.6 g/dL (ref 30.0–36.0)
MCV: 73.4 fL — AB (ref 78.0–100.0)
Monocytes Absolute: 0.6 10*3/uL (ref 0.1–1.0)
Monocytes Relative: 8 % (ref 3–12)
Neutro Abs: 4.3 10*3/uL (ref 1.7–7.7)
Neutrophils Relative %: 58 % (ref 43–77)
PLATELETS: 426 10*3/uL — AB (ref 150–400)
RBC: 5.15 MIL/uL — ABNORMAL HIGH (ref 3.87–5.11)
RDW: 15.9 % — ABNORMAL HIGH (ref 11.5–15.5)
WBC: 7.5 10*3/uL (ref 4.0–10.5)

## 2013-07-14 LAB — LIPID PANEL
CHOLESTEROL: 211 mg/dL — AB (ref 0–200)
HDL: 41 mg/dL (ref >39)
LDL Cholesterol: 133 mg/dL — ABNORMAL HIGH (ref 0–99)
TRIGLYCERIDES: 186 mg/dL — AB (ref <150)
Total CHOL/HDL Ratio: 5.1 Ratio
VLDL: 37 mg/dL (ref 0–40)

## 2013-07-14 LAB — HEMOGLOBIN A1C
HEMOGLOBIN A1C: 6 % — AB (ref <5.7)
Mean Plasma Glucose: 126 mg/dL — ABNORMAL HIGH (ref <117)

## 2013-07-14 MED ORDER — HYDROCODONE-ACETAMINOPHEN 5-300 MG PO TABS: ORAL_TABLET | ORAL | Status: DC

## 2013-07-14 MED ORDER — LANSOPRAZOLE 30 MG PO CPDR: 30.0000 mg | DELAYED_RELEASE_CAPSULE | Freq: Every day | ORAL | Status: DC

## 2013-07-14 NOTE — Patient Instructions (Addendum)
F/u in 4 month, you are a miracle  HBA1C, lipid, cmp and eGFR, cBc, TSH , Vit D and microalb today  You need to sign a pain contract, to continue hydrocodone as before from this office Flu shot and eye exam needed

## 2013-07-15 DIAGNOSIS — E559 Vitamin D deficiency, unspecified: Secondary | ICD-10-CM | POA: Insufficient documentation

## 2013-07-15 LAB — TSH: TSH: 0.85 u[IU]/mL (ref 0.350–4.500)

## 2013-07-15 LAB — VITAMIN D 25 HYDROXY (VIT D DEFICIENCY, FRACTURES): Vit D, 25-Hydroxy: 15 ng/mL — ABNORMAL LOW (ref 30–89)

## 2013-07-16 ENCOUNTER — Other Ambulatory Visit: Payer: Self-pay | Admitting: Family Medicine

## 2013-07-16 ENCOUNTER — Ambulatory Visit (INDEPENDENT_AMBULATORY_CARE_PROVIDER_SITE_OTHER): Payer: 59 | Admitting: *Deleted

## 2013-07-16 DIAGNOSIS — Z7901 Long term (current) use of anticoagulants: Secondary | ICD-10-CM

## 2013-07-16 DIAGNOSIS — I2699 Other pulmonary embolism without acute cor pulmonale: Secondary | ICD-10-CM

## 2013-07-16 LAB — POCT INR: INR: 3.2

## 2013-07-17 LAB — MICROALBUMIN / CREATININE URINE RATIO
Creatinine, Urine: 189.3 mg/dL
MICROALB/CREAT RATIO: 2.9 mg/g (ref 0.0–30.0)
Microalb, Ur: 0.54 mg/dL (ref 0.00–1.89)

## 2013-07-27 ENCOUNTER — Other Ambulatory Visit: Payer: Self-pay | Admitting: Family Medicine

## 2013-08-11 ENCOUNTER — Other Ambulatory Visit: Payer: Self-pay

## 2013-08-11 DIAGNOSIS — E559 Vitamin D deficiency, unspecified: Secondary | ICD-10-CM

## 2013-08-11 MED ORDER — ERGOCALCIFEROL 1.25 MG (50000 UT) PO CAPS
50000.0000 [IU] | ORAL_CAPSULE | ORAL | Status: AC
Start: 2013-08-11 — End: 2014-02-08

## 2013-08-13 ENCOUNTER — Encounter: Payer: Self-pay | Admitting: *Deleted

## 2013-08-13 ENCOUNTER — Other Ambulatory Visit: Payer: Self-pay

## 2013-08-13 MED ORDER — PRAVASTATIN SODIUM 20 MG PO TABS: 20.0000 mg | ORAL_TABLET | Freq: Every day | ORAL | Status: DC

## 2013-08-21 ENCOUNTER — Other Ambulatory Visit: Payer: Self-pay | Admitting: Family Medicine

## 2013-08-24 ENCOUNTER — Other Ambulatory Visit: Payer: Self-pay | Admitting: Family Medicine

## 2013-08-30 NOTE — Progress Notes (Signed)
   Subjective:    Patient ID: Stephanie Singleton, female    DOB: [birth year], 68 y.o.   MRN: [id number]  HPI The PT is here for follow up and re-evaluation of chronic medical conditions, medication management and review of any available recent lab and radiology data.  Preventive health is updated, specifically  Cancer screening and Immunization.  Still refuses most health maintenance recommendations Had extremely successful C spine surgery at [facility]  Since last seen, now able to walk independently and move and use all 4 extremities . She does state her neck pain is increased and uncontrolled and will start receiving pain meds form this office       Review of Systems See HPI Denies recent fever or chills. Denies sinus pressure, nasal congestion, ear pain or sore throat. Denies chest congestion, productive cough or wheezing. Denies chest pains, palpitations and leg swelling Denies abdominal pain, nausea, vomiting,diarrhea or constipation.   Denies dysuria, frequency, hesitancy or incontinence.  Denies headaches, seizures, numbness, or tingling. Denies depression, anxiety or insomnia. Denies skin break down or rash.        Objective:   Physical Exam BP 130/80  Pulse 63  Resp 16  Ht 5\' 6"  (1.676 m)  Wt 213 lb 6.4 oz (96.798 kg)  BMI 34.46 kg/m2  SpO2 99% Patient alert and oriented and in no cardiopulmonary distress.  HEENT: No facial asymmetry, EOMI, no sinus tenderness,  oropharynx pink and moist.  Neck decreased ROM,  no adenopathy.  Chest: Clear to auscultation bilaterally.  CVS: S1, S2  no S3.  ABD: Soft non tender. Bowel sounds normal.  Ext: No edema  MS: Adequate ROM spine, shoulders, hips and knees.  Skin: Intact, no ulcerations or rash noted.  Psych: Good eye contact, normal affect. Memory intact not anxious or depressed appearing.  CNS: CN 2-12 intact, grade 4 power in right upper and lower extremities        Assessment & Plan:   HYPERTENSION Controlled, no change in medication DASH diet and commitment to daily physical activity for a minimum of 30 minutes discussed and encouraged, as a part of hypertension management. The importance of attaining a healthy weight is also discussed.   DIABETES MELLITUS, TYPE II Improved and controlled Patient advised to reduce carb and sweets, commit to regular physical activity, take meds as prescribed, test blood as directed, and attempt to lose weight, to improve blood sugar control. No med change  Hyperlipidemia LDL goal < 100 Uncontrolled Hyperlipidemia:Low fat diet discussed and encouraged.  Medication compliance stressed  OBESITY Improved. Pt applauded on succesful weight loss through lifestyle change, and encouraged to continue same. Weight loss goal set for the next several months.   Unspecified vitamin D deficiency Week;ly supplement needed  NECK PAIN Increased and uncontrolled, will prescribe hydrocodone and pt to sign pain contract

## 2013-08-30 NOTE — Assessment & Plan Note (Signed)
Improved and controlled Patient advised to reduce carb and sweets, commit to regular physical activity, take meds as prescribed, test blood as directed, and attempt to lose weight, to improve blood sugar control. No med change

## 2013-08-30 NOTE — Assessment & Plan Note (Signed)
Controlled, no change in medication DASH diet and commitment to daily physical activity for a minimum of 30 minutes discussed and encouraged, as a part of hypertension management. The importance of attaining a healthy weight is also discussed.  

## 2013-08-30 NOTE — Assessment & Plan Note (Signed)
Weekly supplement needed 

## 2013-08-30 NOTE — Assessment & Plan Note (Signed)
Improved. Pt applauded on succesful weight loss through lifestyle change, and encouraged to continue same. Weight loss goal set for the next several months.  

## 2013-08-30 NOTE — Assessment & Plan Note (Signed)
Increased and uncontrolled, will prescribe hydrocodone and pt to sign pain contract

## 2013-08-30 NOTE — Assessment & Plan Note (Signed)
Uncontrolled Hyperlipidemia:Low fat diet discussed and encouraged.  Medication compliance stressed

## 2013-08-31 ENCOUNTER — Telehealth: Payer: Self-pay | Admitting: Family Medicine

## 2013-08-31 NOTE — Telephone Encounter (Signed)
Please advise does patient need ov?

## 2013-09-02 NOTE — Telephone Encounter (Signed)
She may not,I do need information from the neurosurgeon reinforcing her safety however, I will need to see that before I can make any "promise" to fill out the form

## 2013-09-03 ENCOUNTER — Other Ambulatory Visit: Payer: Self-pay

## 2013-09-03 ENCOUNTER — Telehealth: Payer: Self-pay | Admitting: Family Medicine

## 2013-09-03 DIAGNOSIS — M542 Cervicalgia: Secondary | ICD-10-CM

## 2013-09-03 DIAGNOSIS — E559 Vitamin D deficiency, unspecified: Secondary | ICD-10-CM

## 2013-09-03 DIAGNOSIS — M949 Disorder of cartilage, unspecified: Secondary | ICD-10-CM

## 2013-09-03 DIAGNOSIS — I10 Essential (primary) hypertension: Secondary | ICD-10-CM

## 2013-09-03 DIAGNOSIS — E785 Hyperlipidemia, unspecified: Secondary | ICD-10-CM

## 2013-09-03 DIAGNOSIS — G8190 Hemiplegia, unspecified affecting unspecified side: Secondary | ICD-10-CM

## 2013-09-03 DIAGNOSIS — R5383 Other fatigue: Secondary | ICD-10-CM

## 2013-09-03 DIAGNOSIS — D219 Benign neoplasm of connective and other soft tissue, unspecified: Secondary | ICD-10-CM

## 2013-09-03 DIAGNOSIS — E669 Obesity, unspecified: Secondary | ICD-10-CM

## 2013-09-03 DIAGNOSIS — R5381 Other malaise: Secondary | ICD-10-CM

## 2013-09-03 DIAGNOSIS — E119 Type 2 diabetes mellitus without complications: Secondary | ICD-10-CM

## 2013-09-03 DIAGNOSIS — M899 Disorder of bone, unspecified: Secondary | ICD-10-CM

## 2013-09-03 MED ORDER — HYDROCODONE-ACETAMINOPHEN 5-300 MG PO TABS: ORAL_TABLET | ORAL | Status: DC

## 2013-09-03 NOTE — Telephone Encounter (Signed)
Noted  

## 2013-09-08 NOTE — Telephone Encounter (Signed)
Patient aware. Will have neurosurgeon to send statement

## 2013-09-09 ENCOUNTER — Telehealth: Payer: Self-pay

## 2013-09-10 MED ORDER — HYDROXYZINE HCL 10 MG PO TABS: ORAL_TABLET | ORAL | Status: DC

## 2013-09-10 NOTE — Telephone Encounter (Signed)
Pt states it needs PA. Pharmacy states it was covered the last time she got it but it was out of refills. I refilled and told her if it wasn't covered to send me PA information. States that she will

## 2013-09-14 ENCOUNTER — Telehealth: Payer: Self-pay

## 2013-09-15 NOTE — Telephone Encounter (Signed)
Called and left message for patient to return call.  

## 2013-09-24 ENCOUNTER — Other Ambulatory Visit: Payer: Self-pay | Admitting: Family Medicine

## 2013-09-25 ENCOUNTER — Other Ambulatory Visit: Payer: Self-pay

## 2013-09-25 DIAGNOSIS — E785 Hyperlipidemia, unspecified: Secondary | ICD-10-CM

## 2013-09-25 DIAGNOSIS — R5383 Other fatigue: Secondary | ICD-10-CM

## 2013-09-25 DIAGNOSIS — I10 Essential (primary) hypertension: Secondary | ICD-10-CM

## 2013-09-25 DIAGNOSIS — G8190 Hemiplegia, unspecified affecting unspecified side: Secondary | ICD-10-CM

## 2013-09-25 DIAGNOSIS — M899 Disorder of bone, unspecified: Secondary | ICD-10-CM

## 2013-09-25 DIAGNOSIS — M542 Cervicalgia: Secondary | ICD-10-CM

## 2013-09-25 DIAGNOSIS — E559 Vitamin D deficiency, unspecified: Secondary | ICD-10-CM

## 2013-09-25 DIAGNOSIS — E119 Type 2 diabetes mellitus without complications: Secondary | ICD-10-CM

## 2013-09-25 DIAGNOSIS — D219 Benign neoplasm of connective and other soft tissue, unspecified: Secondary | ICD-10-CM

## 2013-09-25 DIAGNOSIS — R5381 Other malaise: Secondary | ICD-10-CM

## 2013-09-25 DIAGNOSIS — E669 Obesity, unspecified: Secondary | ICD-10-CM

## 2013-09-25 DIAGNOSIS — M949 Disorder of cartilage, unspecified: Secondary | ICD-10-CM

## 2013-09-25 MED ORDER — HYDROCODONE-ACETAMINOPHEN 5-300 MG PO TABS: ORAL_TABLET | ORAL | Status: DC

## 2013-10-12 ENCOUNTER — Telehealth: Payer: Self-pay

## 2013-10-12 NOTE — Telephone Encounter (Signed)
Spoke with patient and she states that she has noticed rectal bleeding after taking laxative.  Has history of hemrrohhoids.  Abdominal pain has been constant x 3 days.  Advised to seek care at ED.  Patient states that she will if problem persists.  Given 1st available appt for 4/24.

## 2013-10-15 ENCOUNTER — Ambulatory Visit (INDEPENDENT_AMBULATORY_CARE_PROVIDER_SITE_OTHER): Payer: 59 | Admitting: *Deleted

## 2013-10-15 DIAGNOSIS — I2699 Other pulmonary embolism without acute cor pulmonale: Secondary | ICD-10-CM

## 2013-10-15 DIAGNOSIS — Z7901 Long term (current) use of anticoagulants: Secondary | ICD-10-CM

## 2013-10-15 DIAGNOSIS — Z5181 Encounter for therapeutic drug level monitoring: Secondary | ICD-10-CM | POA: Insufficient documentation

## 2013-10-15 LAB — POCT INR: INR: 4

## 2013-10-18 ENCOUNTER — Other Ambulatory Visit: Payer: Self-pay | Admitting: Family Medicine

## 2013-10-23 ENCOUNTER — Ambulatory Visit (INDEPENDENT_AMBULATORY_CARE_PROVIDER_SITE_OTHER): Payer: 59 | Admitting: Family Medicine

## 2013-10-23 ENCOUNTER — Encounter (INDEPENDENT_AMBULATORY_CARE_PROVIDER_SITE_OTHER): Payer: Self-pay

## 2013-10-23 VITALS — BP 132/80 | HR 86 | Resp 18 | Ht 66.0 in | Wt 219.1 lb

## 2013-10-23 DIAGNOSIS — R5381 Other malaise: Secondary | ICD-10-CM

## 2013-10-23 DIAGNOSIS — K3189 Other diseases of stomach and duodenum: Secondary | ICD-10-CM

## 2013-10-23 DIAGNOSIS — E785 Hyperlipidemia, unspecified: Secondary | ICD-10-CM

## 2013-10-23 DIAGNOSIS — Z1211 Encounter for screening for malignant neoplasm of colon: Secondary | ICD-10-CM

## 2013-10-23 DIAGNOSIS — G609 Hereditary and idiopathic neuropathy, unspecified: Secondary | ICD-10-CM

## 2013-10-23 DIAGNOSIS — R1013 Epigastric pain: Secondary | ICD-10-CM

## 2013-10-23 DIAGNOSIS — Z13820 Encounter for screening for osteoporosis: Secondary | ICD-10-CM

## 2013-10-23 DIAGNOSIS — G5793 Unspecified mononeuropathy of bilateral lower limbs: Secondary | ICD-10-CM

## 2013-10-23 DIAGNOSIS — K219 Gastro-esophageal reflux disease without esophagitis: Secondary | ICD-10-CM

## 2013-10-23 DIAGNOSIS — R5383 Other fatigue: Secondary | ICD-10-CM

## 2013-10-23 DIAGNOSIS — E119 Type 2 diabetes mellitus without complications: Secondary | ICD-10-CM

## 2013-10-23 DIAGNOSIS — Z1239 Encounter for other screening for malignant neoplasm of breast: Secondary | ICD-10-CM

## 2013-10-23 DIAGNOSIS — I10 Essential (primary) hypertension: Secondary | ICD-10-CM

## 2013-10-23 DIAGNOSIS — Z8719 Personal history of other diseases of the digestive system: Secondary | ICD-10-CM

## 2013-10-23 DIAGNOSIS — E2839 Other primary ovarian failure: Secondary | ICD-10-CM

## 2013-10-23 DIAGNOSIS — I2699 Other pulmonary embolism without acute cor pulmonale: Secondary | ICD-10-CM

## 2013-10-23 DIAGNOSIS — E559 Vitamin D deficiency, unspecified: Secondary | ICD-10-CM

## 2013-10-23 LAB — COMPLETE METABOLIC PANEL WITH GFR
ALBUMIN: 4 g/dL (ref 3.5–5.2)
ALT: 9 U/L (ref 0–35)
AST: 15 U/L (ref 0–37)
Alkaline Phosphatase: 101 U/L (ref 39–117)
BUN: 14 mg/dL (ref 6–23)
CHLORIDE: 100 meq/L (ref 96–112)
CO2: 26 meq/L (ref 19–32)
Calcium: 9.4 mg/dL (ref 8.4–10.5)
Creat: 0.86 mg/dL (ref 0.50–1.10)
GFR, EST NON AFRICAN AMERICAN: 71 mL/min
GFR, Est African American: 82 mL/min
GLUCOSE: 122 mg/dL — AB (ref 70–99)
POTASSIUM: 4 meq/L (ref 3.5–5.3)
Sodium: 138 mEq/L (ref 135–145)
Total Bilirubin: 0.4 mg/dL (ref 0.2–1.2)
Total Protein: 7.3 g/dL (ref 6.0–8.3)

## 2013-10-23 LAB — CBC WITH DIFFERENTIAL/PLATELET
BASOS ABS: 0 10*3/uL (ref 0.0–0.1)
BASOS PCT: 0 % (ref 0–1)
Eosinophils Absolute: 0.2 10*3/uL (ref 0.0–0.7)
Eosinophils Relative: 3 % (ref 0–5)
HCT: 35.9 % — ABNORMAL LOW (ref 36.0–46.0)
HEMOGLOBIN: 12.5 g/dL (ref 12.0–15.0)
Lymphocytes Relative: 38 % (ref 12–46)
Lymphs Abs: 2.5 10*3/uL (ref 0.7–4.0)
MCH: 25 pg — ABNORMAL LOW (ref 26.0–34.0)
MCHC: 34.8 g/dL (ref 30.0–36.0)
MCV: 71.7 fL — ABNORMAL LOW (ref 78.0–100.0)
Monocytes Absolute: 0.5 10*3/uL (ref 0.1–1.0)
Monocytes Relative: 8 % (ref 3–12)
NEUTROS ABS: 3.4 10*3/uL (ref 1.7–7.7)
NEUTROS PCT: 51 % (ref 43–77)
Platelets: 381 10*3/uL (ref 150–400)
RBC: 5.01 MIL/uL (ref 3.87–5.11)
RDW: 16.6 % — ABNORMAL HIGH (ref 11.5–15.5)
WBC: 6.6 10*3/uL (ref 4.0–10.5)

## 2013-10-23 LAB — HEMOGLOBIN A1C
HEMOGLOBIN A1C: 6.5 % — AB (ref <5.7)
Mean Plasma Glucose: 140 mg/dL — ABNORMAL HIGH (ref <117)

## 2013-10-23 LAB — LIPID PANEL
CHOLESTEROL: 157 mg/dL (ref 0–200)
HDL: 35 mg/dL — ABNORMAL LOW (ref >39)
LDL Cholesterol: 93 mg/dL (ref 0–99)
TRIGLYCERIDES: 145 mg/dL (ref <150)
Total CHOL/HDL Ratio: 4.5 Ratio
VLDL: 29 mg/dL (ref 0–40)

## 2013-10-23 NOTE — Progress Notes (Signed)
Subjective:    Patient ID: Stephanie Singleton, female    DOB: [birth year], 68 y.o.   MRN: [id number]  HPI 1 month h/o central abdominal pain , worse with eating, initially occurred almost daily, experienced as dull aching, , no early satiety , hematemesis , nausea   Or vomiting, rated from a 2 to a 5 when flares. Longstanding h/o GERd , over 5 years , has never had upper endo  3 episodes of painless rectal blood seen when she wipes, initially seen months ago, then a few weeks ago with laxative, less than a tablespoon of blood . No visible rectal blood in over 2 weeks, has never had a colonoscopy, has refused in the past, now ready.  Uncontrolled , though improved lower extremity pain, will increase gabapentin dose. Pt reports inc her metformin to 2 daily as FBG averaging 140      Review of Systems See HPI Denies recent fever or chills. Denies sinus pressure, nasal congestion, ear pain or sore throat. Denies chest congestion, productive cough or wheezing. Denies chest pains, palpitations and leg swelling .   Denies dysuria, frequency, hesitancy or incontinence. Denies headaches or  seizures,  Denies depression, anxiety or insomnia. Denies skin break down or rash.        Objective:   Physical Exam BP 132/80  Pulse 86  Resp 18  Ht 5\' 6"  (1.676 m)  Wt 219 lb 1.3 oz (99.374 kg)  BMI 35.38 kg/m2  SpO2 97% Patient alert and oriented and in no cardiopulmonary distress.  HEENT: No facial asymmetry, EOMI, no sinus tenderness,  oropharynx pink and moist.  Neck supple no adenopathy.  Chest: Clear to auscultation bilaterally.  CVS: S1, S2 no murmurs, no S3.  ABD: Soft non tender. Bowel sounds normal. Rectal : deferred, no current bleeding , needs colonoscopy Ext: No edema  MS: Adequate though reduced  ROM spine, shoulders, hips and knees.  Skin: Intact, no ulcerations or rash noted.  Psych: Good eye contact, normal affect. Memory intact not anxious or depressed  appearing.  CNS: CN 2-12 intact, power, tone and sensation normal throughout.        Assessment & Plan:  History of rectal bleeding H/o intermittent BRRB, after defecation when she wipes, painless, has never had colonoscopy , but is now ready to do so. She is on chronic anti coagulation. Denies change in stool character Will refer to [facility] for eval per herrequest  Neuropathic pain of both legs Slight improvement on neurontin but still uncontrolled, increase gabapentin dose at night to 300mg   DIABETES MELLITUS, TYPE II Patient advised to reduce carb and sweets, commit to regular physical activity, take meds as prescribed, test blood as directed, and attempt to lose weight, to improve blood sugar control.   HYPERTENSION Controlled, no change in medication DASH diet and commitment to daily physical activity for a minimum of 30 minutes discussed and encouraged, as a part of hypertension management. The importance of attaining a healthy weight is also discussed.   GERD C/o dyspepsia and reflux symptoms at times, need H pylori test, has never had upper endo, long h/o GERD, over 5 years and this may be indicated based on symptoms of intermittent central abdominal pain, GI referral for this also. I discussed the option of upper and lower endo simultaneously if deemed necesary  PULMONARY EMBOLISM Chronic cpoumadin managed through coumadin clinic for over 5 years  Hyperlipidemia LDL goal < 100 Improved, no change in medication Pt needs to increase physical activity

## 2013-10-23 NOTE — Patient Instructions (Addendum)
Annual wellness in 3.5 month, call if you need me before  Labs today cBC, lipid , cmp and EGFr, H pylori,hBA1C lipid   You are referred fo0r eye exam , Dr Lesia Hausen are referred for mammogram at breast center  You are referred to [facility] gI re rectal bleeding, abdominal discomfort and longstanding GERd  You are referred for bone density test  HBa1C in 3.5 month , EGFR, non fast and vit D Will increase gabapentin dose at night for improved pain control to 3 at bedtime

## 2013-10-24 ENCOUNTER — Encounter: Payer: Self-pay | Admitting: Family Medicine

## 2013-10-24 DIAGNOSIS — M62838 Other muscle spasm: Secondary | ICD-10-CM | POA: Insufficient documentation

## 2013-10-24 DIAGNOSIS — Z8719 Personal history of other diseases of the digestive system: Secondary | ICD-10-CM | POA: Insufficient documentation

## 2013-10-24 NOTE — Assessment & Plan Note (Signed)
Chronic cpoumadin managed through coumadin clinic for over 5 years

## 2013-10-24 NOTE — Assessment & Plan Note (Signed)
C/o dyspepsia and reflux symptoms at times, need H pylori test, has never had upper endo, long h/o GERD, over 5 years and this may be indicated based on symptoms of intermittent central abdominal pain, GI referral for this also. I discussed the option of upper and lower endo simultaneously if deemed necesary

## 2013-10-24 NOTE — Assessment & Plan Note (Signed)
Slight improvement on neurontin but still uncontrolled, increase gabapentin dose at night to 300mg 

## 2013-10-24 NOTE — Assessment & Plan Note (Signed)
H/o intermittent BRRB, after defecation when she wipes, painless, has never had colonoscopy , but is now ready to do so. She is on chronic anti coagulation. Denies change in stool character Will refer to [facility] for eval per herrequest

## 2013-10-24 NOTE — Assessment & Plan Note (Signed)
Patient advised to reduce carb and sweets, commit to regular physical activity, take meds as prescribed, test blood as directed, and attempt to lose weight, to improve blood sugar control.

## 2013-10-24 NOTE — Assessment & Plan Note (Signed)
Controlled, no change in medication DASH diet and commitment to daily physical activity for a minimum of 30 minutes discussed and encouraged, as a part of hypertension management. The importance of attaining a healthy weight is also discussed.  

## 2013-10-24 NOTE — Assessment & Plan Note (Signed)
Improved, no change in medication Pt needs to increase physical activity

## 2013-10-26 ENCOUNTER — Other Ambulatory Visit: Payer: Self-pay

## 2013-10-26 ENCOUNTER — Encounter: Payer: Self-pay | Admitting: Gastroenterology

## 2013-10-26 ENCOUNTER — Other Ambulatory Visit: Payer: Self-pay | Admitting: Family Medicine

## 2013-10-26 DIAGNOSIS — E119 Type 2 diabetes mellitus without complications: Secondary | ICD-10-CM

## 2013-10-26 DIAGNOSIS — G5793 Unspecified mononeuropathy of bilateral lower limbs: Secondary | ICD-10-CM

## 2013-10-26 LAB — H. PYLORI ANTIBODY, IGG: H Pylori IgG: 0.85 {ISR}

## 2013-10-26 MED ORDER — GABAPENTIN 100 MG PO CAPS: ORAL_CAPSULE | ORAL | Status: DC

## 2013-10-26 MED ORDER — METFORMIN HCL 500 MG PO TABS: 500.0000 mg | ORAL_TABLET | Freq: Two times a day (BID) | ORAL | Status: DC

## 2013-10-26 NOTE — Addendum Note (Signed)
Addended by: Denman George B on: 10/26/2013 10:32 AM   Modules accepted: Orders

## 2013-11-05 ENCOUNTER — Ambulatory Visit (INDEPENDENT_AMBULATORY_CARE_PROVIDER_SITE_OTHER): Payer: 59 | Admitting: *Deleted

## 2013-11-05 DIAGNOSIS — Z7901 Long term (current) use of anticoagulants: Secondary | ICD-10-CM

## 2013-11-05 DIAGNOSIS — Z5181 Encounter for therapeutic drug level monitoring: Secondary | ICD-10-CM

## 2013-11-05 DIAGNOSIS — I2699 Other pulmonary embolism without acute cor pulmonale: Secondary | ICD-10-CM

## 2013-11-05 LAB — POCT INR: INR: 2.4

## 2013-11-06 ENCOUNTER — Ambulatory Visit
Admission: RE | Admit: 2013-11-06 | Discharge: 2013-11-06 | Disposition: A | Discharge status: Home / Self Care | Payer: Medicare Other | Source: Ambulatory Visit | Attending: Family Medicine | Admitting: Family Medicine

## 2013-11-06 DIAGNOSIS — E2839 Other primary ovarian failure: Secondary | ICD-10-CM

## 2013-11-06 DIAGNOSIS — Z1239 Encounter for other screening for malignant neoplasm of breast: Secondary | ICD-10-CM

## 2013-11-09 ENCOUNTER — Other Ambulatory Visit: Payer: Self-pay | Admitting: Family Medicine

## 2013-11-09 DIAGNOSIS — R928 Other abnormal and inconclusive findings on diagnostic imaging of breast: Secondary | ICD-10-CM

## 2013-11-12 MED ORDER — ALENDRONATE SODIUM 70 MG PO TABS: 70.0000 mg | ORAL_TABLET | ORAL | Status: DC

## 2013-11-12 NOTE — Addendum Note (Signed)
Addended by: Denman George B on: 11/12/2013 04:12 PM   Modules accepted: Orders

## 2013-11-16 ENCOUNTER — Telehealth: Payer: Self-pay | Admitting: Family Medicine

## 2013-11-16 NOTE — Telephone Encounter (Signed)
Please explain the need to take medication to strengthen bones.  If she agrees please  Send in a script for either fosamax 70 mg once weekly x 12 month total, or actonel 35 mg once weekly x 12 month total, whichever is covered , if she agrees. Also needs to be on calcium 1000 to 1200 mg daily, and vitamin D3 600 to 800 IU daily, for bone health. .Please also explain,  That the calcium and Vitamin D3 are  obtained over the counter, and not by prescription. Report obtained from breast center

## 2013-11-17 ENCOUNTER — Ambulatory Visit (INDEPENDENT_AMBULATORY_CARE_PROVIDER_SITE_OTHER): Payer: Medicare Other | Admitting: Gastroenterology

## 2013-11-17 ENCOUNTER — Encounter: Payer: Self-pay | Admitting: Gastroenterology

## 2013-11-17 VITALS — BP 122/78 | HR 60 | Ht 66.0 in | Wt 216.0 lb

## 2013-11-17 DIAGNOSIS — K625 Hemorrhage of anus and rectum: Secondary | ICD-10-CM

## 2013-11-17 DIAGNOSIS — K219 Gastro-esophageal reflux disease without esophagitis: Secondary | ICD-10-CM

## 2013-11-17 MED ORDER — MOVIPREP 100 G PO SOLR: 1.0000 | Freq: Once | ORAL | Status: DC

## 2013-11-17 NOTE — Telephone Encounter (Signed)
Called patient and the line was busy, Will try again

## 2013-11-17 NOTE — Patient Instructions (Signed)
You will be set up for a colonoscopy (for rectal bleeding). You will be set up for an upper endoscopy. You will need to hold your coumadin for 5 days prior to the colonoscopy, we will get in touch with your PCP to make sure that is safe.

## 2013-11-17 NOTE — Progress Notes (Signed)
HPI: This is a   very pleasant 68 year old woman whom I am meeting for the first time today.  2 months ago, took a laxative and when she wiped there was red blood.  Saw blood for about 2 days.   She doesn't really feel constipated.  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 Also requires narcs periodically. At most 2 vic's per day.  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    Has periumbilical pains, achey constant pain, eating may make it worse.  Laying down relieves the pain.  No GERD symptoms while on prevacid. Has been on it for many years. Decades of GERD.  Overall stable weight.  Rare nsaids.  09/2013 hb normal but MCV low, LFTs normal, H pylori neg  No dysphagia, no vomiting, no nausea.   Review of systems: Pertinent positive and negative review of systems were noted in the above HPI section. Complete review of systems was performed and was otherwise normal.    Past Medical History  Diagnosis Date  . Tumor of soft tissue of neck   . Embolism - blood clot 2004    Pulmonary .on coumadin  . OSA (obstructive sleep apnea) DX 8 YRS AGO.DOES NOT WEAR MASK  . Diabetes mellitus DX X 1 YR    USE METFORMIN ACCORDING TO CBG RESULT  . GERD (gastroesophageal reflux disease)     ON PREVACID  . 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 Hypertension     CARDIOLOGY VISIT.CARDIAC CLEARANCE.LOVENOX INSTRUCTIONS.  Marland Kitchen HTN (hypertension)     EKG 10/23 IN EPIC    Past Surgical History  Procedure Laterality Date  . Cervical fusion      Current Outpatient Prescriptions  Medication Sig Dispense Refill  . ACCU-CHEK AVIVA PLUS test strip TEST ONCE DAILY  32 each  2  . ergocalciferol (VITAMIN D2) 50000 UNITS capsule Take 1 capsule (50,000 Units total) by mouth once a week. One capsule once weekly  4 capsule  5  . gabapentin  (NEURONTIN) 100 MG capsule One capsule twice daily and three at bedtime.  150 capsule  3  . Hydrocodone-Acetaminophen 5-300 MG TABS TAKE 1 TABLET BY MOUTH TWICE A DAY AS NEEDED FOR NECK PAIN  60 each  0  . Lancets (ACCU-CHEK MULTICLIX) lancets USE AS DIRECTED DAILY  102 each  2  . lansoprazole (PREVACID) 30 MG capsule Take 1 capsule (30 mg total) by mouth daily.  30 capsule  3  . lisinopril-hydrochlorothiazide (PRINZIDE,ZESTORETIC) 20-25 MG per tablet TAKE 1 AND 1/2 TABLETS BY MOUTH EVERY DAY  45 tablet  2  . metFORMIN (GLUCOPHAGE) 500 MG tablet Take 1 tablet (500 mg total) by mouth 2 (two) times daily with a meal.  60 tablet  5  . potassium chloride SA (K-DUR,KLOR-CON) 20 MEQ tablet Take 20 mEq by mouth 2 (two) times a week. *Take only on the days that Demadex (TORSEMIDE) is taken*      . pravastatin (PRAVACHOL) 20 MG tablet Take 1 tablet (20 mg total) by mouth daily.  30 tablet  3  . tiZANidine (ZANAFLEX) 4 MG capsule Take 4 mg by mouth every 8 (eight) hours as needed for muscle spasms.      Marland Kitchen tiZANidine (ZANAFLEX) 4 MG tablet TAKE 1 TABLET BY MOUTH EVERY 8 HOURS  AS NEEDED FOR MUSCLE SPASMS  90 tablet  1  . torsemide (DEMADEX) 20 MG tablet Take 1 tablet 2 times a week as needed for swelling  8 tablet  1  . warfarin (COUMADIN) 5 MG tablet TAKE 1 TABLET ON Wednesdays AND 1/2 TABLET THE REST OF WEEK      . alendronate (FOSAMAX) 70 MG tablet Take 1 tablet (70 mg total) by mouth every 7 (seven) days. Take with a full glass of water on an empty stomach.  4 tablet  6   No current facility-administered medications for this visit.    Allergies as of 11/17/2013 - Review Complete 11/17/2013  Allergen Reaction Noted  . Aspirin Nausea And Vomiting 06/09/2007    Family History  Problem Relation Age of Onset  . Heart disease Mother   . Hypertension Mother   . Stroke Mother   . Diabetes Father   . Peripheral vascular disease Father   . Peripheral vascular disease Brother   . Heart disease Brother      History   Social History  . Marital Status: Divorced    Spouse Name: N/A    Number of Children: 73  . Years of Education: N/A   Occupational History  .      Pastor   Social History Main Topics  . Smoking status: Former Research scientist (life sciences)  . Smokeless tobacco: Never Used  . Alcohol Use: No  . Drug Use: No  . Sexual Activity: No   Other Topics Concern  . Not on file   Social History Narrative  . No narrative on file    Physical Exam: BP 122/78  Pulse 60  Ht 5\' 6"  (1.676 m)  Wt 216 lb (97.977 kg)  BMI 34.88 kg/m2 Constitutional: generally well-appearing Psychiatric: alert and oriented x3 Eyes: extraocular movements intact Mouth: oral pharynx moist, no lesions Neck: supple no lymphadenopathy Cardiovascular: heart regular rate and rhythm Lungs: clear to auscultation bilaterally Abdomen: soft, nontender, nondistended, no obvious ascites, no peritoneal signs, normal bowel sounds Extremities: no lower extremity edema bilaterally Skin: no lesions on visible extremities    Assessment and plan: 68 y.o. female with  obesity, recent minor rectal bleeding, chronic GERD and intermittent postprandial discomfort  For her minor rectal bleeding she needs colonoscopy to exclude neoplastic causes. I explained to her that I think is highly unlikely she has a cancer. At the same time I will also proceed with EGD for her chronic GERD and intermittent postprandial epigastric discomfort. She takes Coumadin for remote history of pulmonary embolus and we'll communicate with her primary care physician about the safety of holding this medicine for 5 days prior to the procedures. I see no reason for any further blood tests or imaging studies at this point.

## 2013-11-18 ENCOUNTER — Other Ambulatory Visit: Payer: Self-pay

## 2013-11-18 DIAGNOSIS — E119 Type 2 diabetes mellitus without complications: Secondary | ICD-10-CM

## 2013-11-18 MED ORDER — METFORMIN HCL 500 MG PO TABS: 500.0000 mg | ORAL_TABLET | Freq: Two times a day (BID) | ORAL | Status: DC

## 2013-11-20 ENCOUNTER — Ambulatory Visit
Admission: RE | Admit: 2013-11-20 | Discharge: 2013-11-20 | Disposition: A | Discharge status: Home / Self Care | Payer: Medicare Other | Source: Ambulatory Visit | Attending: Family Medicine | Admitting: Family Medicine

## 2013-11-20 ENCOUNTER — Other Ambulatory Visit: Payer: Self-pay | Admitting: Family Medicine

## 2013-11-20 DIAGNOSIS — R928 Other abnormal and inconclusive findings on diagnostic imaging of breast: Secondary | ICD-10-CM

## 2013-11-20 NOTE — Telephone Encounter (Signed)
Patient aware.

## 2013-11-24 ENCOUNTER — Telehealth: Payer: Self-pay

## 2013-11-24 LAB — HM DIABETES EYE EXAM

## 2013-11-24 NOTE — Telephone Encounter (Signed)
Anti coag resent colon on 12/07/13 need response ASAP

## 2013-11-24 NOTE — Telephone Encounter (Signed)
Message copied by Barron Alvine on Tue Nov 24, 2013  8:13 AM ------      Message from: Barron Alvine      Created: Tue Nov 17, 2013  2:07 PM       Waiting on anticoag ------

## 2013-11-25 NOTE — Telephone Encounter (Signed)
Stephanie Singleton manages the pt coumadin letter has been faxed to her.  I spoke with someone and they will be sure she see the letter and responds

## 2013-11-26 NOTE — Telephone Encounter (Signed)
Response received from Jory Sims ok to hold 5 days prior, letter to be scanned into EPIC I tried to call pt but got no answer and no voice mail

## 2013-11-26 NOTE — Telephone Encounter (Signed)
Pt has been notified to hold coumadin 5 days prior to procedure

## 2013-11-27 ENCOUNTER — Telehealth: Payer: Self-pay

## 2013-11-27 NOTE — Telephone Encounter (Signed)
Message copied by Barron Alvine on Fri Nov 27, 2013  8:11 AM ------      Message from: Barron Alvine      Created: Fri Nov 20, 2013  8:21 AM      Regarding: RE: coumadin       I sent the coumadin letter to NP at [facility].       ----- Message -----         From: Milus Banister, MD         Sent: 11/19/2013   3:23 PM           To: Barron Alvine, CMA, Fayrene Helper, MD      Subject: RE: coumadin                                             Dr. Edmon Crape, thanks            [place],      Can you send blood thinner question to this patient's cardiologist.  She needs colonoscopy and we need to know about holding it safely.            Thanks                  ----- Message -----         From: Fayrene Helper, MD         Sent: 11/19/2013  12:57 PM           To: Milus Banister, MD      Subject: coumadin                                                 Dr Ardis Hughs,       Thank you for evaluating Ms Plasse            She has been on coumadin continually since 2004 when she had extensive bilateral pulmonary emboli.            Her coumadin is managed by the coumadin clinic.            I think she will likely  need to be bridged with lovenox prior to her procedures, and not have the coumadin discontinued.            Input from cardiology will be useful as they dose her coumadin and would therefore be responsible for bridging her if this is decided upon             ------

## 2013-11-27 NOTE — Telephone Encounter (Signed)
See alternate note coumadin hold okd

## 2013-11-29 ENCOUNTER — Other Ambulatory Visit: Payer: Self-pay | Admitting: Family Medicine

## 2013-12-03 ENCOUNTER — Telehealth: Payer: Self-pay | Admitting: Gastroenterology

## 2013-12-03 ENCOUNTER — Telehealth: Payer: Self-pay | Admitting: *Deleted

## 2013-12-03 NOTE — Telephone Encounter (Signed)
Left message to call back  

## 2013-12-03 NOTE — Telephone Encounter (Signed)
The pt was advised to use Prep H and tucks pads, avoid constipation and keep appt for colon endo.  Pt agreed and will call if she worsens

## 2013-12-03 NOTE — Telephone Encounter (Signed)
was alreadPatty Sherlean Foot, CMA at 11/26/2013 11:28 AM      Status: Signed            Pt has been notified to hold coumadin 5 days prior to procedure         Barron Alvine, CMA at 11/26/2013  8:46 AM      Status: Signed            Response received from Jory Sims ok to hold 5 days prior, letter to be scanned into EPIC I tried to call pt but got no answer and no voice mail           Barron Alvine, CMA at 11/25/2013 11:21 AM      Status: Signed            Jory Sims manages the pt coumadin letter has been faxed to her.  I spoke with someone and they will be sure she see the letter and responds         Barron Alvine, CMA at 11/24/2013 10:52 AM      Status: Signed            Anti coag resent colon on 12/07/13 need response ASAP           Barron Alvine, CMA at 11/24/2013  8:13 AM      Status: Signed            Message copied by Barron Alvine on Tue Nov 24, 2013  8:13 AM ------      Message from: Barron Alvine      Created: Tue Nov 17, 2013  2:07 PM        Waiting on anticoag

## 2013-12-03 NOTE — Telephone Encounter (Signed)
Patient has questions regarding coumadin and a procedure she is to have on Monday.  / tgs

## 2013-12-03 NOTE — Telephone Encounter (Signed)
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

## 2013-12-04 ENCOUNTER — Other Ambulatory Visit: Payer: Self-pay

## 2013-12-04 DIAGNOSIS — E785 Hyperlipidemia, unspecified: Secondary | ICD-10-CM

## 2013-12-04 DIAGNOSIS — R5383 Other fatigue: Secondary | ICD-10-CM

## 2013-12-04 DIAGNOSIS — E119 Type 2 diabetes mellitus without complications: Secondary | ICD-10-CM

## 2013-12-04 DIAGNOSIS — G8190 Hemiplegia, unspecified affecting unspecified side: Secondary | ICD-10-CM

## 2013-12-04 DIAGNOSIS — I10 Essential (primary) hypertension: Secondary | ICD-10-CM

## 2013-12-04 DIAGNOSIS — M949 Disorder of cartilage, unspecified: Secondary | ICD-10-CM

## 2013-12-04 DIAGNOSIS — M542 Cervicalgia: Secondary | ICD-10-CM

## 2013-12-04 DIAGNOSIS — D219 Benign neoplasm of connective and other soft tissue, unspecified: Secondary | ICD-10-CM

## 2013-12-04 DIAGNOSIS — R5381 Other malaise: Secondary | ICD-10-CM

## 2013-12-04 DIAGNOSIS — E669 Obesity, unspecified: Secondary | ICD-10-CM

## 2013-12-04 DIAGNOSIS — E559 Vitamin D deficiency, unspecified: Secondary | ICD-10-CM

## 2013-12-04 DIAGNOSIS — M899 Disorder of bone, unspecified: Secondary | ICD-10-CM

## 2013-12-04 MED ORDER — HYDROCODONE-ACETAMINOPHEN 5-300 MG PO TABS: ORAL_TABLET | ORAL | Status: DC

## 2013-12-07 ENCOUNTER — Encounter: Payer: Self-pay | Admitting: Gastroenterology

## 2013-12-07 ENCOUNTER — Ambulatory Visit (AMBULATORY_SURGERY_CENTER): Payer: Medicare Other | Admitting: Gastroenterology

## 2013-12-07 VITALS — BP 128/68 | HR 61 | Temp 98.0°F | Resp 23 | Ht 66.0 in | Wt 216.0 lb

## 2013-12-07 DIAGNOSIS — K644 Residual hemorrhoidal skin tags: Secondary | ICD-10-CM

## 2013-12-07 DIAGNOSIS — D126 Benign neoplasm of colon, unspecified: Secondary | ICD-10-CM

## 2013-12-07 DIAGNOSIS — K219 Gastro-esophageal reflux disease without esophagitis: Secondary | ICD-10-CM

## 2013-12-07 DIAGNOSIS — K449 Diaphragmatic hernia without obstruction or gangrene: Secondary | ICD-10-CM

## 2013-12-07 DIAGNOSIS — K625 Hemorrhage of anus and rectum: Secondary | ICD-10-CM

## 2013-12-07 LAB — GLUCOSE, CAPILLARY
GLUCOSE-CAPILLARY: 107 mg/dL — AB (ref 70–99)
Glucose-Capillary: 113 mg/dL — ABNORMAL HIGH (ref 70–99)

## 2013-12-07 MED ORDER — SODIUM CHLORIDE 0.9 % IV SOLN: 500.0000 mL | INTRAVENOUS | Status: DC

## 2013-12-07 NOTE — Progress Notes (Signed)
A/ox3 pleased with MAC, report to Jane RN 

## 2013-12-07 NOTE — Progress Notes (Signed)
Called to room to assist during endoscopic procedure.  Patient ID and intended procedure confirmed with present staff. Received instructions for my participation in the procedure from the performing physician.  

## 2013-12-07 NOTE — Op Note (Signed)
[place]  [address]. [place], [address]   COLONOSCOPY PROCEDURE REPORT  PATIENT: Stephanie Singleton, Stephanie Singleton  MR#: [id number] BIRTHDATE: [birth year] , 67  yrs. old GENDER: Female ENDOSCOPIST: Milus Banister, MD REFERRED FG:[id number] Moshe Cipro, M.D. PROCEDURE DATE:  12/07/2013 PROCEDURE:   Colonoscopy with snare polypectomy First Screening Colonoscopy - Avg.  risk and is 50 yrs.  old or older - No.  Prior Negative Screening - Now for repeat screening. N/A  History of Adenoma - Now for follow-up colonoscopy & has been > or = to 3 yrs.  N/A  Polyps Removed Today? Yes. ASA CLASS:   Class III INDICATIONS:Rectal Bleeding. MEDICATIONS: MAC sedation, administered by CRNA and propofol (Diprivan) 200mg  IV  DESCRIPTION OF PROCEDURE:   After the risks benefits and alternatives of the procedure were thoroughly explained, informed consent was obtained.  A digital rectal exam revealed external thrombosed hemorrhoids.   The [id number]  endoscope was introduced through the anus and advanced to the cecum, which was identified by both the appendix and ileocecal valve. No adverse events experienced.   The quality of the prep was excellent.  The instrument was then slowly withdrawn as the colon was fully examined.  COLON FINDINGS: One polyp was found, removed and sent to pathology. This was sessile, 81mm across, located in sigmoid segment, removed with cold snare.  There was a single medium sized partially thrombosed external anal hemorrhoid.  The examination was otherwise normal.  Retroflexed views revealed no abnormalities. The time to cecum=1 minutes 40 seconds.  Withdrawal time=8 minutes 39 seconds. The scope was withdrawn and the procedure completed. COMPLICATIONS: There were no complications. ENDOSCOPIC IMPRESSION: One polyp was found, removed and sent to pathology. There was a single medium sized partially thrombosed external anal hemorrhoid. The examination was  otherwise normal. RECOMMENDATIONS: 1. If the polyp(s) removed today are proven to be adenomatous (pre-cancerous) polyps, you will need a repeat colonoscopy in 5 years.  Otherwise you should continue to follow colorectal cancer screening guidelines for "routine risk" patients with colonoscopy in 10 years.  You will receive a letter within 1-2 weeks with the results of your biopsy as well as final recommendations.  Please call my office if you have not received a letter after 3 weeks. 2. Please start once daily OTC fiber supplment (orange flavored, powdered citrucel) and also twice daily sitz baths.  Please call Dr. Ardis Hughs' office in 3-4 weeks to report on your response.  If this does not help resolve the hemorrhoid, will likely refer you to general surgery.  eSigned:  Milus Banister, MD 12/07/2013 2:35 PM

## 2013-12-07 NOTE — Patient Instructions (Addendum)
YOU HAD AN ENDOSCOPIC PROCEDURE TODAY AT THE [facility]: Refer to the procedure report that was given to you for any specific questions about what was found during the examination.  If the procedure report does not answer your questions, please call your gastroenterologist to clarify.  If you requested that your care partner not be given the details of your procedure findings, then the procedure report has been included in a sealed envelope for you to review at your convenience later.  YOU SHOULD EXPECT: Some feelings of bloating in the abdomen. Passage of more gas than usual.  Walking can help get rid of the air that was put into your GI tract during the procedure and reduce the bloating. If you had a lower endoscopy (such as a colonoscopy or flexible sigmoidoscopy) you may notice spotting of blood in your stool or on the toilet paper. If you underwent a bowel prep for your procedure, then you may not have a normal bowel movement for a few days.  DIET: Your first meal following the procedure should be a light meal and then it is ok to progress to your normal diet.  A half-sandwich or bowl of soup is an example of a good first meal.  Heavy or fried foods are harder to digest and may make you feel nauseous or bloated.  Likewise meals heavy in dairy and vegetables can cause extra gas to form and this can also increase the bloating.  Drink plenty of fluids but you should avoid alcoholic beverages for 24 hours.  ACTIVITY: Your care partner should take you home directly after the procedure.  You should plan to take it easy, moving slowly for the rest of the day.  You can resume normal activity the day after the procedure however you should NOT DRIVE or use heavy machinery for 24 hours (because of the sedation medicines used during the test).    SYMPTOMS TO REPORT IMMEDIATELY: A gastroenterologist can be reached at any hour.  During normal business hours, 8:30 AM to 5:00 PM Monday through Friday,  call [phone].  After hours and on weekends, please call the GI answering service at [phone] who will take a message and have the physician on call contact you.   Following lower endoscopy (colonoscopy or flexible sigmoidoscopy):  Excessive amounts of blood in the stool  Significant tenderness or worsening of abdominal pains  Swelling of the abdomen that is new, acute  Fever of 100F or higher  Following upper endoscopy (EGD)  Vomiting of blood or coffee ground material  New chest pain or pain under the shoulder blades  Painful or persistently difficult swallowing  New shortness of breath  Fever of 100F or higher  Black, tarry-looking stools  FOLLOW UP: If any biopsies were taken you will be contacted by phone or by letter within the next 1-3 weeks.  Call your gastroenterologist if you have not heard about the biopsies in 3 weeks.  Our staff will call the home number listed on your records the next business day following your procedure to check on you and address any questions or concerns that you may have at that time regarding the information given to you following your procedure. This is a courtesy call and so if there is no answer at the home number and we have not heard from you through the emergency physician on call, we will assume that you have returned to your regular daily activities without incident.  SIGNATURES/CONFIDENTIALITY: You and/or your care   partner have signed paperwork which will be entered into your electronic medical record.  These signatures attest to the fact that that the information above on your After Visit Summary has been reviewed and is understood.  Full responsibility of the confidentiality of this discharge information lies with you and/or your care-partner.  Polyp information. Hemorrhoid information given.  Do Sitz bath twice daily for 10-15 minutes.  Please use Citrucel(orange powdered) daily.  Let Dr. Ardis Hughs know in 3-4 weeks how You are  doing-call office.    Hiatal hernia information given.  Resume coumadin today.

## 2013-12-07 NOTE — Op Note (Signed)
[place]  [address]. [place], [address]   ENDOSCOPY PROCEDURE REPORT  PATIENT: Stephanie Singleton, Stephanie Singleton  MR#: [id number] BIRTHDATE: 1946/05/28 , 67  yrs. old GENDER: Female ENDOSCOPIST: Milus Banister, MD PROCEDURE DATE:  12/07/2013 PROCEDURE:  EGD, diagnostic ASA CLASS:     Class III INDICATIONS:  Chronic GERD, post prandial discomfort. MEDICATIONS: MAC sedation, administered by CRNA and Propofol (Diprivan) 80 mg IV TOPICAL ANESTHETIC: none  DESCRIPTION OF PROCEDURE: After the risks benefits and alternatives of the procedure were thoroughly explained, informed consent was obtained.  The LB [id number] endoscope was introduced through the mouth and advanced to the second portion of the duodenum. Without limitations.  The instrument was slowly withdrawn as the mucosa was fully examined.   There was a small (2cm) hiatal hernia.  The examination was otherwise normal.  Retroflexed views revealed no abnormalities. The scope was then withdrawn from the patient and the procedure completed.  COMPLICATIONS: There were no complications. ENDOSCOPIC IMPRESSION: There was a small (2cm) hiatal hernia. The examination was otherwise normal.  RECOMMENDATIONS: Observe clinically.   eSigned:  Milus Banister, MD 12/07/2013 2:39 PM   CC:  Tula Nakayama, MD

## 2013-12-08 ENCOUNTER — Telehealth: Payer: Self-pay

## 2013-12-08 NOTE — Telephone Encounter (Signed)
  Follow up Call-  Call back number 12/07/2013  Post procedure Call Back phone  # [phone]  Permission to leave phone message Yes     Patient questions:  Do you have a fever, pain , or abdominal swelling? no Pain Score  0 *  Have you tolerated food without any problems? yes  Have you been able to return to your normal activities? yes  Do you have any questions about your discharge instructions: Diet   no Medications  no Follow up visit  no  Do you have questions or concerns about your Care? no  Actions: * If pain score is 4 or above: No action needed, pain <4.

## 2013-12-14 ENCOUNTER — Encounter: Payer: Self-pay | Admitting: Gastroenterology

## 2013-12-14 ENCOUNTER — Encounter: Payer: Self-pay | Admitting: *Deleted

## 2013-12-15 ENCOUNTER — Ambulatory Visit: Payer: 59 | Admitting: Gastroenterology

## 2013-12-24 ENCOUNTER — Other Ambulatory Visit: Payer: Self-pay | Admitting: Family Medicine

## 2013-12-27 ENCOUNTER — Other Ambulatory Visit: Payer: Self-pay | Admitting: Family Medicine

## 2013-12-30 ENCOUNTER — Other Ambulatory Visit: Payer: Self-pay

## 2013-12-30 DIAGNOSIS — M949 Disorder of cartilage, unspecified: Secondary | ICD-10-CM

## 2013-12-30 DIAGNOSIS — G8190 Hemiplegia, unspecified affecting unspecified side: Secondary | ICD-10-CM

## 2013-12-30 DIAGNOSIS — R5383 Other fatigue: Secondary | ICD-10-CM

## 2013-12-30 DIAGNOSIS — D219 Benign neoplasm of connective and other soft tissue, unspecified: Secondary | ICD-10-CM

## 2013-12-30 DIAGNOSIS — M542 Cervicalgia: Secondary | ICD-10-CM

## 2013-12-30 DIAGNOSIS — E669 Obesity, unspecified: Secondary | ICD-10-CM

## 2013-12-30 DIAGNOSIS — M899 Disorder of bone, unspecified: Secondary | ICD-10-CM

## 2013-12-30 DIAGNOSIS — R5381 Other malaise: Secondary | ICD-10-CM

## 2013-12-30 DIAGNOSIS — I10 Essential (primary) hypertension: Secondary | ICD-10-CM

## 2013-12-30 DIAGNOSIS — E559 Vitamin D deficiency, unspecified: Secondary | ICD-10-CM

## 2013-12-30 DIAGNOSIS — E119 Type 2 diabetes mellitus without complications: Secondary | ICD-10-CM

## 2013-12-30 MED ORDER — HYDROCODONE-ACETAMINOPHEN 5-300 MG PO TABS: ORAL_TABLET | ORAL | Status: DC

## 2014-01-13 ENCOUNTER — Ambulatory Visit (INDEPENDENT_AMBULATORY_CARE_PROVIDER_SITE_OTHER): Payer: Medicare Other | Admitting: *Deleted

## 2014-01-13 DIAGNOSIS — Z5181 Encounter for therapeutic drug level monitoring: Secondary | ICD-10-CM

## 2014-01-13 DIAGNOSIS — I2699 Other pulmonary embolism without acute cor pulmonale: Secondary | ICD-10-CM

## 2014-01-13 LAB — POCT INR: INR: 1.4

## 2014-01-27 ENCOUNTER — Ambulatory Visit (INDEPENDENT_AMBULATORY_CARE_PROVIDER_SITE_OTHER): Payer: Medicare Other | Admitting: *Deleted

## 2014-01-27 DIAGNOSIS — Z5181 Encounter for therapeutic drug level monitoring: Secondary | ICD-10-CM

## 2014-01-27 DIAGNOSIS — I2699 Other pulmonary embolism without acute cor pulmonale: Secondary | ICD-10-CM

## 2014-01-27 LAB — POCT INR: INR: 2.1

## 2014-02-04 ENCOUNTER — Other Ambulatory Visit: Payer: Self-pay

## 2014-02-04 DIAGNOSIS — M899 Disorder of bone, unspecified: Secondary | ICD-10-CM

## 2014-02-04 DIAGNOSIS — D219 Benign neoplasm of connective and other soft tissue, unspecified: Secondary | ICD-10-CM

## 2014-02-04 DIAGNOSIS — R5383 Other fatigue: Secondary | ICD-10-CM

## 2014-02-04 DIAGNOSIS — M949 Disorder of cartilage, unspecified: Secondary | ICD-10-CM

## 2014-02-04 DIAGNOSIS — E669 Obesity, unspecified: Secondary | ICD-10-CM

## 2014-02-04 DIAGNOSIS — E559 Vitamin D deficiency, unspecified: Secondary | ICD-10-CM

## 2014-02-04 DIAGNOSIS — R5381 Other malaise: Secondary | ICD-10-CM

## 2014-02-04 DIAGNOSIS — M542 Cervicalgia: Secondary | ICD-10-CM

## 2014-02-04 DIAGNOSIS — I10 Essential (primary) hypertension: Secondary | ICD-10-CM

## 2014-02-04 DIAGNOSIS — E119 Type 2 diabetes mellitus without complications: Secondary | ICD-10-CM

## 2014-02-04 DIAGNOSIS — G8190 Hemiplegia, unspecified affecting unspecified side: Secondary | ICD-10-CM

## 2014-02-04 MED ORDER — HYDROCODONE-ACETAMINOPHEN 5-300 MG PO TABS: ORAL_TABLET | ORAL | Status: DC

## 2014-02-09 ENCOUNTER — Other Ambulatory Visit: Payer: Self-pay | Admitting: Family Medicine

## 2014-02-16 ENCOUNTER — Other Ambulatory Visit: Payer: Self-pay | Admitting: Family Medicine

## 2014-02-18 ENCOUNTER — Encounter: Payer: 59 | Admitting: Family Medicine

## 2014-02-25 ENCOUNTER — Encounter: Payer: Medicare Other | Admitting: Adult Health

## 2014-02-25 ENCOUNTER — Encounter: Payer: Self-pay | Admitting: *Deleted

## 2014-02-25 NOTE — Progress Notes (Signed)
   Error NO SHOW

## 2014-03-04 ENCOUNTER — Encounter: Payer: Self-pay | Admitting: Adult Health

## 2014-03-04 ENCOUNTER — Ambulatory Visit (INDEPENDENT_AMBULATORY_CARE_PROVIDER_SITE_OTHER): Payer: Medicare Other | Admitting: Pharmacist

## 2014-03-04 ENCOUNTER — Ambulatory Visit (INDEPENDENT_AMBULATORY_CARE_PROVIDER_SITE_OTHER): Payer: Medicare Other | Admitting: Adult Health

## 2014-03-04 VITALS — BP 142/88 | HR 64 | Ht 66.0 in | Wt 204.0 lb

## 2014-03-04 DIAGNOSIS — Z5181 Encounter for therapeutic drug level monitoring: Secondary | ICD-10-CM

## 2014-03-04 DIAGNOSIS — I10 Essential (primary) hypertension: Secondary | ICD-10-CM

## 2014-03-04 DIAGNOSIS — I2699 Other pulmonary embolism without acute cor pulmonale: Secondary | ICD-10-CM

## 2014-03-04 LAB — POCT INR: INR: 1.9

## 2014-03-04 NOTE — Patient Instructions (Signed)
Your physician wants you to follow-up in: 1 year. You will receive a reminder letter in the mail two months in advance. If you don't receive a letter, please call our office to schedule the follow-up appointment.  Your physician recommends that you continue on your current medications as directed. Please refer to the Current Medication list given to you today.  Thank you for choosing [facility]!!    

## 2014-03-04 NOTE — Progress Notes (Deleted)
Name: Stephanie Singleton    DOB: Oct 12, 1945  Age: 68 y.o.  MR#: [id number]       PCP:  Tula Nakayama, MD      Insurance: Payor: Onnie Boer MEDICARE / Plan: AARP MEDICARE COMPLETE / Product Type: *No Product type* /   CC:    Chief Complaint  Patient presents with  . Hypertension    VS Filed Vitals:   03/04/14 1401  BP: 142/88  Pulse: 64  Height: _0  (1.676 m)  Weight: 204 lb (92.534 kg)  SpO2: 98%    Weights Current Weight  03/04/14 204 lb (92.534 kg)  12/07/13 216 lb (97.977 kg)  11/17/13 216 lb (97.977 kg)    Blood Pressure  BP Readings from Last 3 Encounters:  03/04/14 142/88  12/07/13 128/68  11/17/13 122/78     Admit date:  (Not on file) Last encounter with RMR:  Visit date not found   Allergy Aspirin  Current Outpatient Prescriptions  Medication Sig Dispense Refill  . ACCU-CHEK AVIVA PLUS test strip TEST ONCE DAILY  50 each  2  . alendronate (FOSAMAX) 70 MG tablet Take 1 tablet (70 mg total) by mouth every 7 (seven) days. Take with a full glass of water on an empty stomach.  4 tablet  6  . gabapentin (NEURONTIN) 100 MG capsule One capsule twice daily and three at bedtime.  150 capsule  3  . Hydrocodone-Acetaminophen 5-300 MG TABS TAKE 1 TABLET BY MOUTH TWICE A DAY AS NEEDED FOR NECK PAIN  60 each  0  . Lancets (ACCU-CHEK MULTICLIX) lancets USE AS DIRECTED DAILY  102 each  2  . lansoprazole (PREVACID) 30 MG capsule TAKE ONE CAPSULE BY MOUTH DAILY  30 capsule  3  . lisinopril-hydrochlorothiazide (PRINZIDE,ZESTORETIC) 20-25 MG per tablet TAKE 1 AND 1/2 TABLETS BY MOUTH EVERY DAY  45 tablet  2  . metFORMIN (GLUCOPHAGE) 500 MG tablet Take 1 tablet (500 mg total) by mouth 2 (two) times daily with a meal.  60 tablet  5  . potassium chloride SA (K-DUR,KLOR-CON) 20 MEQ tablet Take 20 mEq by mouth 2 (two) times a week. *Take only on the days that Demadex (TORSEMIDE) is taken*      . pravastatin (PRAVACHOL) 20 MG tablet TAKE 1 TABLET BY MOUTH DAILY  30 tablet  3   . tiZANidine (ZANAFLEX) 4 MG capsule Take 4 mg by mouth every 8 (eight) hours as needed for muscle spasms.      Marland Kitchen tiZANidine (ZANAFLEX) 4 MG tablet TAKE 1 TABLET BY MOUTH EVERY 8 HOURS AS NEEDED FOR MUSCLE SPASMS  90 tablet  1  . torsemide (DEMADEX) 20 MG tablet Take 1 tablet 2 times a week as needed for swelling  8 tablet  1  . warfarin (COUMADIN) 5 MG tablet TAKE 1 TABLET ON Wednesdays AND 1/2 TABLET THE REST OF WEEK       No current facility-administered medications for this visit.    Discontinued Meds:   There are no discontinued medications.  Patient Active Problem List   Diagnosis Date Noted  . History of rectal bleeding 10/24/2013  . Neuropathic pain of both legs 10/24/2013  . Encounter for therapeutic drug monitoring 10/15/2013  . Unspecified vitamin D deficiency 07/15/2013  . Hemiplegia and hemiparesis 03/26/2013  . At high risk for falls 03/26/2013  . Pain in joint, lower leg 09/04/2012  . Hyperlipidemia LDL goal < 100 07/28/2011  . Preoperative cardiovascular examination 04/24/2011  . Carotid bruit 01/22/2011  . Back  pain, thoracic 01/22/2011  . Long term current use of anticoagulant 09/27/2010  . EPISTAXIS, RECURRENT 07/19/2010  . SHOULDER PAIN, RIGHT 07/19/2010  . DIABETES MELLITUS, TYPE II 03/14/2010  . NECK PAIN 01/15/2010  . NEOPLASMS UNSPEC NATURE BONE SOFT TISSUE&SKIN 05/27/2009  . DYSPNEA 05/27/2009  . SLEEP APNEA 05/17/2009  . PVD 08/26/2008  . HEADACHE 08/26/2008  . TIA 05/02/2008  . 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 04/12/2008  . NARCOLEPSY WITHOUT CATAPLEXY 04/12/2008  . PULMONARY EMBOLISM 04/12/2008  . Other Malaise and Fatigue 04/12/2008  . NEUROFIBROMA 01/13/2008  . OBESITY 01/13/2008  . HYPERTENSION 01/13/2008  . GERD 01/13/2008  . LEG PAIN, BILATERAL 01/13/2008    LABS    Component Value Date/Time   NA 138 10/23/2013 1114   NA 138 07/14/2013 1148   NA 140 09/04/2012 1020   K 4.0 10/23/2013 1114   K 3.9 07/14/2013 1148   K 4.1  09/04/2012 1020   CL 100 10/23/2013 1114   CL 100 07/14/2013 1148   CL 98 09/04/2012 1020   CO2 26 10/23/2013 1114   CO2 27 07/14/2013 1148   CO2 26 09/04/2012 1020   GLUCOSE 122* 10/23/2013 1114   GLUCOSE 108* 07/14/2013 1148   GLUCOSE 99 09/04/2012 1020   BUN 14 10/23/2013 1114   BUN 18 07/14/2013 1148   BUN 18 09/04/2012 1020   CREATININE 0.86 10/23/2013 1114   CREATININE 1.00 07/14/2013 1148   CREATININE 0.91 09/04/2012 1020   CREATININE 0.85 05/01/2011 1548   CREATININE 0.96 07/20/2010 0209   CREATININE 1.08 01/11/2010 0607   CALCIUM 9.4 10/23/2013 1114   CALCIUM 10.1 07/14/2013 1148   CALCIUM 9.9 09/04/2012 1020   GFRNONAA 71 10/23/2013 1114   GFRNONAA 59* 07/14/2013 1148   GFRNONAA 67 09/04/2012 1020   GFRNONAA 71* 05/01/2011 1548   GFRNONAA >60 12/24/2008 2229   GFRNONAA >60 04/23/2008 1000   GFRAA 82 10/23/2013 1114   GFRAA 68 07/14/2013 1148   GFRAA 77 09/04/2012 1020   GFRAA 83* 05/01/2011 1548   GFRAA  Value: >60        The eGFR has been calculated using the MDRD equation. This calculation has not been validated in all clinical situations. eGFR's persistently <60 mL/min signify possible Chronic Kidney Disease. 12/24/2008 2229   GFRAA  Value: >60        The eGFR has been calculated using the MDRD equation. This calculation has not been validated in all clinical 04/23/2008 1000   CMP     Component Value Date/Time   NA 138 10/23/2013 1114   K 4.0 10/23/2013 1114   CL 100 10/23/2013 1114   CO2 26 10/23/2013 1114   GLUCOSE 122* 10/23/2013 1114   BUN 14 10/23/2013 1114   CREATININE 0.86 10/23/2013 1114   CREATININE 0.85 05/01/2011 1548   CALCIUM 9.4 10/23/2013 1114   PROT 7.3 10/23/2013 1114   ALBUMIN 4.0 10/23/2013 1114   AST 15 10/23/2013 1114   ALT 9 10/23/2013 1114   ALKPHOS 101 10/23/2013 1114   BILITOT 0.4 10/23/2013 1114   GFRNONAA 71 10/23/2013 1114   GFRNONAA 71* 05/01/2011 1548   GFRAA 82 10/23/2013 1114   GFRAA 83* 05/01/2011 1548       Component Value Date/Time   WBC 6.6 10/23/2013 1114    WBC 7.5 07/14/2013 1148   WBC 6.6 09/04/2012 1020   HGB 12.5 10/23/2013 1114   HGB 12.7 07/14/2013 1148   HGB 12.3 09/04/2012 1020   HCT 35.9* 10/23/2013 1114  HCT 37.8 07/14/2013 1148   HCT 34.8* 09/04/2012 1020   MCV 71.7* 10/23/2013 1114   MCV 73.4* 07/14/2013 1148   MCV 76.0* 09/04/2012 1020    Lipid Panel     Component Value Date/Time   CHOL 157 10/23/2013 1114   TRIG 145 10/23/2013 1114   HDL 35* 10/23/2013 1114   CHOLHDL 4.5 10/23/2013 1114   VLDL 29 10/23/2013 1114   LDLCALC 93 10/23/2013 1114    ABG No results found for this basename: phart, pco2, pco2art, po2, po2art, hco3, tco2, acidbasedef, o2sat     Lab Results  Component Value Date   TSH 0.850 07/14/2013   BNP (last 3 results) No results found for this basename: PROBNP,  in the last 8760 hours Cardiac Panel (last 3 results) No results found for this basename: CKTOTAL, CKMB, TROPONINI, RELINDX,  in the last 72 hours  Iron/TIBC/Ferritin/ %Sat No results found for this basename: iron, tibc, ferritin, ironpctsat     EKG Orders placed in visit on 03/04/14  . EKG 12-LEAD     Prior Assessment and Plan Problem List as of 03/04/2014     Cardiovascular and Mediastinum   HYPERTENSION   Last Assessment & Plan   10/23/2013 Office Visit Written 10/24/2013  8:17 PM by Fayrene Helper, MD     Controlled, no change in medication DASH diet and commitment to daily physical activity for a minimum of 30 minutes discussed and encouraged, as a part of hypertension management. The importance of attaining a healthy weight is also discussed.     PULMONARY EMBOLISM   Last Assessment & Plan   10/23/2013 Office Visit Written 10/24/2013  8:21 PM by Fayrene Helper, MD     Chronic cpoumadin managed through coumadin clinic for over 5 years    TIA   PVD     Respiratory   EPISTAXIS, RECURRENT     Digestive   GERD   Last Assessment & Plan   10/23/2013 Office Visit Written 10/24/2013  8:19 PM by Fayrene Helper, MD     C/o dyspepsia and  reflux symptoms at times, need H pylori test, has never had upper endo, long h/o GERD, over 5 years and this may be indicated based on symptoms of intermittent central abdominal pain, GI referral for this also. I discussed the option of upper and lower endo simultaneously if deemed necesary      Endocrine   DIABETES MELLITUS, TYPE II   Last Assessment & Plan   10/23/2013 Office Visit Written 10/24/2013  8:16 PM by Fayrene Helper, MD     Patient advised to reduce carb and sweets, commit to regular physical activity, take meds as prescribed, test blood as directed, and attempt to lose weight, to improve blood sugar control.       Nervous and Auditory   Hemiplegia and hemiparesis   Last Assessment & Plan   03/26/2013 Office Visit Written 03/28/2013  6:16 PM by Fayrene Helper, MD     Severe , pt has progressively lost power in right upper and lower extremities, is a high fall risk and needs assistance with ADL's currently. All this is due to progressively enlarging schwannoma which she elected o "watch" for at least 5 years, she has been followed b y a neurosurgeon all this time also.  Now will have surgery at [facility] in the next 2 to 3 weeks Requests shower chair as well as recliner lift chair due to progressive deterioration to safely move or care  for herself      Other   NEUROFIBROMA   Last Assessment & Plan   12/11/2012 Office Visit Written 12/13/2012  2:46 PM by Fayrene Helper, MD     Significant  deterioration in ability to function, 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  Pt now has decided on surgery, will need cardiology to be involved in bridging coumadin, also to provide clearnce. Neurosrgery will also be alerted to current decision  Needs help with personal care , handicap sticker also    NEOPLASMS UNSPEC NATURE BONE SOFT TISSUE&SKIN   OBESITY   Last Assessment & Plan   07/14/2013 Office Visit Written 08/30/2013  1:35 PM by Fayrene Helper, MD     Improved. Pt applauded on  succesful weight loss through lifestyle change, and encouraged to continue same. Weight loss goal set for the next several months.     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   NARCOLEPSY WITHOUT CATAPLEXY   NECK PAIN   Last Assessment & Plan   07/14/2013 Office Visit Written 08/30/2013  1:36 PM by Fayrene Helper, MD     Increased and uncontrolled, will prescribe hydrocodone and pt to sign pain contract    LEG PAIN, BILATERAL   SLEEP APNEA   Other Malaise and Fatigue   HEADACHE   DYSPNEA   SHOULDER PAIN, RIGHT   Last Assessment & Plan   06/05/2012 Office Visit Written 06/08/2012  6:48 PM by Fayrene Helper, MD     Deteriorated will refer for ortho re eval    Long term current use of anticoagulant   Carotid bruit   Last Assessment & Plan   01/18/2011 Office Visit Written 01/22/2011  5:54 PM by Tula Nakayama, MD     Lightheadedness and bruit, carotid doppler to eval for obstruction    Back pain, thoracic   Preoperative cardiovascular examination   Last Assessment & Plan   04/24/2011 Office Visit Written 04/24/2011  7:48 PM by Lendon Colonel, NP     The patient has been seen and examined by Dr. Lattie Haw and myself.  She is stable from cardiac standpoint. Vital signs and EKG are reviewed. Recommend proceeding with planned surgery with anticoagulation as follows:  One week prior to surgery stop coumadin and begin lovenox 40 mg SQ daily.  2 days prior to surgery stop lovenox. Begin coumadin again ASAP post-operatively when surgeon is agreeable to restart anticoagulation. She is to follow-up with coumadin clinic as soon as possible after surgery. Follow-up appointment with Dr. Lattie Haw one month after surgery.  Her daughter is an LPN and will assist her with injections.  This has been explained to her by Dr. Lattie Haw.  She is anxious about the procedure and the anticoagulation using lovenox, but is willing to proceed. She is advised to call for any questions.     Hyperlipidemia LDL goal < 100   Last Assessment & Plan   10/23/2013 Office Visit Written 10/24/2013  8:23 PM by Fayrene Helper, MD     Improved, no change in medication Pt needs to increase physical activity    Pain in joint, lower leg   At high risk for falls   Last Assessment & Plan   03/26/2013 Office Visit Written 03/28/2013  6:18 PM by Fayrene Helper, MD     Worsening right hemiplegia puts pt at high fall risk    Unspecified vitamin D deficiency   Last Assessment & Plan   07/14/2013 Office Visit Written 08/30/2013  1:36 PM  by Fayrene Helper, MD     [facility] supplement needed    Encounter for therapeutic drug monitoring   History of rectal bleeding   Last Assessment & Plan   10/23/2013 Office Visit Written 10/24/2013  8:05 PM by Fayrene Helper, MD     H/o intermittent BRRB, after defecation when she wipes, painless, has never had colonoscopy , but is now ready to do so. She is on chronic anti coagulation. Denies change in stool character Will refer to [facility] for eval per herrequest    Neuropathic pain of both legs   Last Assessment & Plan   10/23/2013 Office Visit Written 10/24/2013  8:12 PM by Fayrene Helper, MD     Slight improvement on neurontin but still uncontrolled, increase gabapentin dose at night to 32m        Imaging: No results found.

## 2014-03-04 NOTE — Progress Notes (Signed)
HPI: Stephanie Singleton s a 68 year old female patient to be est. with Dr. Bronson Ing or Dr.Branch we follow for ongoing assessment and management of hypertension, with history of pulmonary embolism, on chronic Coumadin therapy. She was last seen in the office in August of 2014 and is here for annual followup visit. She has a history of medical noncompliance with long-standing hypertension. Most recent echocardiogram August of 2014  Moderate basal septal hypertrophy with LVEF 37-85%, grade 1 diastolic dysfunction. Mild MAC and aortic valve calcification. Trace tricuspid regurgitation. Unable to assess PASP. Normal CVP.  She today, without any complaint. She has had a cervical fusion was completed in October 2014, She had an EGD and colonoscopy in June of 2015. She is followed by Dr. Moshe Cipro and has had recent labs completed. She denies any symptoms of chest pain, shortness of breath, melena, or dizziness.  Allergies  Allergen Reactions  . Aspirin Nausea And Vomiting    Current Outpatient Prescriptions  Medication Sig Dispense Refill  . ACCU-CHEK AVIVA PLUS test strip TEST ONCE DAILY  50 each  2  . alendronate (FOSAMAX) 70 MG tablet Take 1 tablet (70 mg total) by mouth every 7 (seven) days. Take with a full glass of water on an empty stomach.  4 tablet  6  . gabapentin (NEURONTIN) 100 MG capsule One capsule twice daily and three at bedtime.  150 capsule  3  . Hydrocodone-Acetaminophen 5-300 MG TABS TAKE 1 TABLET BY MOUTH TWICE A DAY AS NEEDED FOR NECK PAIN  60 each  0  . Lancets (ACCU-CHEK MULTICLIX) lancets USE AS DIRECTED DAILY  102 each  2  . lansoprazole (PREVACID) 30 MG capsule TAKE ONE CAPSULE BY MOUTH DAILY  30 capsule  3  . lisinopril-hydrochlorothiazide (PRINZIDE,ZESTORETIC) 20-25 MG per tablet TAKE 1 AND 1/2 TABLETS BY MOUTH EVERY DAY  45 tablet  2  . metFORMIN (GLUCOPHAGE) 500 MG tablet Take 1 tablet (500 mg total) by mouth 2 (two) times daily with a meal.  60 tablet  5  .  potassium chloride SA (K-DUR,KLOR-CON) 20 MEQ tablet Take 20 mEq by mouth 2 (two) times a week. *Take only on the days that Demadex (TORSEMIDE) is taken*      . pravastatin (PRAVACHOL) 20 MG tablet TAKE 1 TABLET BY MOUTH DAILY  30 tablet  3  . tiZANidine (ZANAFLEX) 4 MG capsule Take 4 mg by mouth every 8 (eight) hours as needed for muscle spasms.      Marland Kitchen tiZANidine (ZANAFLEX) 4 MG tablet TAKE 1 TABLET BY MOUTH EVERY 8 HOURS AS NEEDED FOR MUSCLE SPASMS  90 tablet  1  . torsemide (DEMADEX) 20 MG tablet Take 1 tablet 2 times a week as needed for swelling  8 tablet  1  . warfarin (COUMADIN) 5 MG tablet TAKE 1 TABLET ON Wednesdays AND 1/2 TABLET THE REST OF WEEK       No current facility-administered medications for this visit.    Past Medical History  Diagnosis Date  . Tumor of soft tissue of neck   . Embolism - blood clot 2004    Pulmonary .on coumadin  . OSA (obstructive sleep apnea) DX 8 YRS AGO.DOES NOT WEAR MASK  . Diabetes mellitus DX X 1 YR    USE METFORMIN ACCORDING TO CBG RESULT  . GERD (gastroesophageal reflux disease)     ON PREVACID  . 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 Hypertension  CARDIOLOGY VISIT.CARDIAC CLEARANCE.LOVENOX INSTRUCTIONS.  Marland Kitchen HTN (hypertension)     EKG 10/23 IN EPIC  . Hyperlipidemia     Past Surgical History  Procedure Laterality Date  . Cervical fusion      ANV:BTYOMA of systems complete and found to be negative unless listed above  PHYSICAL EXAM BP 142/88  Pulse 64  Ht 5\' 6"  (1.676 m)  Wt 204 lb (92.534 kg)  BMI 32.94 kg/m2  SpO2 98% General: Well developed, well nourished, in no acute distress Head: Eyes PERRLA, No xanthomas.   Normal cephalic and atramatic  Lungs: Clear bilaterally to auscultation and percussion. Heart: HRRR S1 S2, without MRG.  Pulses are 2+ & equal.            No carotid bruit. No JVD.  No abdominal bruits. No femoral bruits. Abdomen: Bowel sounds are  positive, abdomen soft and non-tender without masses or                  Hernia's noted. Msk:  Back normal, normal gait. Normal strength and tone for age. Extremities: No clubbing, cyanosis or edema.  DP +1 Neuro: Alert and oriented X 3. Psych:  Good affect, responds appropriately     ASSESSMENT AND PLAN

## 2014-03-04 NOTE — Assessment & Plan Note (Signed)
She continues on Coumadin and is followed in our [place] [facility]. She is compliant. She offers no bleeding issues. She has had a recent colonoscopy and EGD. 2 months ago.

## 2014-03-04 NOTE — Assessment & Plan Note (Signed)
Blood pressure is well-controlled. She denies any headaches or dizziness. She is advised on a low sodium diet. Labs are completed by Dr. Moshe Cipro.

## 2014-03-11 ENCOUNTER — Other Ambulatory Visit: Payer: Self-pay | Admitting: Adult Health

## 2014-03-31 ENCOUNTER — Other Ambulatory Visit: Payer: Self-pay

## 2014-03-31 DIAGNOSIS — R5383 Other fatigue: Secondary | ICD-10-CM

## 2014-03-31 DIAGNOSIS — G8190 Hemiplegia, unspecified affecting unspecified side: Secondary | ICD-10-CM

## 2014-03-31 DIAGNOSIS — M899 Disorder of bone, unspecified: Secondary | ICD-10-CM

## 2014-03-31 DIAGNOSIS — D219 Benign neoplasm of connective and other soft tissue, unspecified: Secondary | ICD-10-CM

## 2014-03-31 DIAGNOSIS — M542 Cervicalgia: Secondary | ICD-10-CM

## 2014-03-31 DIAGNOSIS — E559 Vitamin D deficiency, unspecified: Secondary | ICD-10-CM

## 2014-03-31 DIAGNOSIS — I10 Essential (primary) hypertension: Secondary | ICD-10-CM

## 2014-03-31 DIAGNOSIS — M949 Disorder of cartilage, unspecified: Secondary | ICD-10-CM

## 2014-03-31 DIAGNOSIS — E669 Obesity, unspecified: Secondary | ICD-10-CM

## 2014-03-31 DIAGNOSIS — R5381 Other malaise: Secondary | ICD-10-CM

## 2014-03-31 DIAGNOSIS — E119 Type 2 diabetes mellitus without complications: Secondary | ICD-10-CM

## 2014-03-31 MED ORDER — HYDROCODONE-ACETAMINOPHEN 5-300 MG PO TABS: ORAL_TABLET | ORAL | Status: DC

## 2014-04-01 ENCOUNTER — Ambulatory Visit (INDEPENDENT_AMBULATORY_CARE_PROVIDER_SITE_OTHER): Payer: Medicare Other | Admitting: *Deleted

## 2014-04-01 DIAGNOSIS — Z5181 Encounter for therapeutic drug level monitoring: Secondary | ICD-10-CM

## 2014-04-01 DIAGNOSIS — I2699 Other pulmonary embolism without acute cor pulmonale: Secondary | ICD-10-CM

## 2014-04-01 LAB — POCT INR: INR: 1.9

## 2014-04-05 ENCOUNTER — Encounter (INDEPENDENT_AMBULATORY_CARE_PROVIDER_SITE_OTHER): Payer: Self-pay

## 2014-04-05 ENCOUNTER — Encounter: Payer: Self-pay | Admitting: Family Medicine

## 2014-04-05 ENCOUNTER — Ambulatory Visit (INDEPENDENT_AMBULATORY_CARE_PROVIDER_SITE_OTHER): Payer: Medicare Other | Admitting: Family Medicine

## 2014-04-05 VITALS — BP 132/78 | HR 62 | Resp 18 | Ht 66.0 in | Wt 208.0 lb

## 2014-04-05 DIAGNOSIS — R296 Repeated falls: Secondary | ICD-10-CM

## 2014-04-05 DIAGNOSIS — Z9181 History of falling: Secondary | ICD-10-CM

## 2014-04-05 DIAGNOSIS — Z2821 Immunization not carried out because of patient refusal: Secondary | ICD-10-CM

## 2014-04-05 DIAGNOSIS — E119 Type 2 diabetes mellitus without complications: Secondary | ICD-10-CM

## 2014-04-05 DIAGNOSIS — E669 Obesity, unspecified: Secondary | ICD-10-CM

## 2014-04-05 DIAGNOSIS — M546 Pain in thoracic spine: Secondary | ICD-10-CM

## 2014-04-05 DIAGNOSIS — I10 Essential (primary) hypertension: Secondary | ICD-10-CM

## 2014-04-05 DIAGNOSIS — M542 Cervicalgia: Secondary | ICD-10-CM

## 2014-04-05 DIAGNOSIS — E785 Hyperlipidemia, unspecified: Secondary | ICD-10-CM

## 2014-04-05 MED ORDER — GABAPENTIN 300 MG PO CAPS: 300.0000 mg | ORAL_CAPSULE | Freq: Two times a day (BID) | ORAL | Status: DC

## 2014-04-05 NOTE — Patient Instructions (Addendum)
Annual wellness and rectal in 4.5 month, call if you need me before  You are referred to Dr Cyndy Freeze  Pls get xray of mid back , at [facility] due to pain  New increased dose of gabapentin to help with pain   Do not take more than FOUR extra strength tylenol tablets in a 24 hour period, with the gabapentin you should need less  Fasting lipid, cmp and EGFR and HBa1C and cBc within the next week

## 2014-04-06 ENCOUNTER — Other Ambulatory Visit: Payer: Self-pay

## 2014-04-06 DIAGNOSIS — Z2821 Immunization not carried out because of patient refusal: Secondary | ICD-10-CM | POA: Insufficient documentation

## 2014-04-06 MED ORDER — LANSOPRAZOLE 30 MG PO CPDR: DELAYED_RELEASE_CAPSULE | ORAL | Status: DC

## 2014-04-06 NOTE — Assessment & Plan Note (Signed)
Lifelong coumadin no complications of epistaxis, hematemesis, melena , BRRB or hematuria, monitored through coumadin clinic

## 2014-04-06 NOTE — Assessment & Plan Note (Signed)
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

## 2014-04-06 NOTE — Assessment & Plan Note (Signed)
Continues to refuse all vaccines despite re education of benefit outweighing risk

## 2014-04-06 NOTE — Progress Notes (Signed)
Subjective:    Patient ID: Stephanie Singleton, female    DOB: 03/14/1946, 68 y.o.   MRN: [id number]  HPI The PT is here for follow up and re-evaluation of chronic medical conditions, medication management and review of any available recent lab and radiology data.  Needs to re establish with local neurosurgeon as Provider at [facility] has re located. Ongoing neck and mid back pain, meds help to some extent, will inc gabapentin dose for improvement in symptom control, and order xray of mid back.  The PT denies any adverse reactions to current medications since the last visit.  There are no new concerns.  There are no specific complaints  Continues to refuse all immunization as well as colon screen with rectal or stool card at this visit Test FBG daily, avg between 130 to 145       Review of Systems See HPI Denies recent fever or chills. Denies sinus pressure, nasal congestion, ear pain or sore throat. Denies chest congestion, productive cough or wheezing. Denies chest pains, palpitations and leg swelling Denies abdominal pain, nausea, vomiting,diarrhea or constipation.   Denies dysuria, frequency, hesitancy or incontinence. Denies headaches, seizures, numbness, or tingling. Denies depression, anxiety or insomnia. Denies skin break down or rash.        Objective:   Physical Exam  BP 132/78  Pulse 62  Resp 18  Ht 5\' 6"  (1.676 m)  Wt 208 lb (94.348 kg)  BMI 33.59 kg/m2  SpO2 100% Patient alert and oriented and in no cardiopulmonary distress.  HEENT: No facial asymmetry, EOMI,   oropharynx pink and moist.  Neck supple no JVD, no mass.  Chest: Clear to auscultation bilaterally.  CVS: S1, S2 no murmurs, no S3.Regular rate.  ABD: Soft non tender.   Ext: No edema  MS: Adequate ROM spine, shoulders, hips and knees.  Skin: Intact, no ulcerations or rash noted.  Psych: Good eye contact, normal affect. Memory intact not anxious or depressed appearing.  CNS: CN 2-12  intact, power,  normal throughout.no focal deficits noted. .      Assessment & Plan:  Hypertension goal BP (blood pressure) < 130/80 Controlled, no change in medication DASH diet and commitment to daily physical activity for a minimum of 30 minutes discussed and encouraged, as a part of hypertension management. The importance of attaining a healthy weight is also discussed.   DIABETES MELLITUS, TYPE II Patient advised to reduce carb and sweets, commit to regular physical activity, take meds as prescribed, test blood as directed, and attempt to lose weight, to improve blood sugar control. Updated lab needed at/ before next visit. fBG values seem high will f/u on lab result, pt states was under dosing for some time , but noted elevated blood sugar levels  Hyperlipidemia with target LDL less than 100 Updated lab needed at/ before next visit. Hyperlipidemia:Low fat diet discussed and encouraged.    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 Pt applauded on succesful weight loss through lifestyle change, and encouraged to continue same. Weight loss goal set for the next  several months.

## 2014-04-06 NOTE — Assessment & Plan Note (Addendum)
Increased , ongoing localized pain, non radiaitng , obtain xray and refer to neurosurg for ongoing surveillance, dose increase in gabapentin

## 2014-04-06 NOTE — Assessment & Plan Note (Signed)
Patient advised to reduce carb and sweets, commit to regular physical activity, take meds as prescribed, test blood as directed, and attempt to lose weight, to improve blood sugar control. Updated lab needed at/ before next visit. fBG values seem high will f/u on lab result, pt states was under dosing for some time , but noted elevated blood sugar levels

## 2014-04-06 NOTE — Assessment & Plan Note (Signed)
Controlled, no change in medication DASH diet and commitment to daily physical activity for a minimum of 30 minutes discussed and encouraged, as a part of hypertension management. The importance of attaining a healthy weight is also discussed.  

## 2014-04-06 NOTE — Assessment & Plan Note (Signed)
Updated lab needed at/ before next visit. Hyperlipidemia:Low fat diet discussed and encouraged.   

## 2014-04-06 NOTE — Assessment & Plan Note (Signed)
Improved. Pt applauded on succesful weight loss through lifestyle change, and encouraged to continue same. Weight loss goal set for the next several months.  

## 2014-04-10 IMAGING — US US EXTREM LOW VENOUS*R*
1 series · 14 of 24 positions shown · non-contrast
Comparison: None

CLINICAL DATA: r/o DVT;; OPEN SORE RIGHT LEG.

RIGHT LOWER EXTREMITY VENOUS DUPLEX ULTRASOUND
TECHNIQUE: Gray-scale sonography with compression, as well as color
and duplex ultrasound, were performed to evaluate the deep venous
system from the level of the common femoral vein through the
popliteal and proximal calf veins.

[Series 1: us extrem low venous*right* · 0.09mm/px · 14 of 28 slices shown]
[im 1/28]
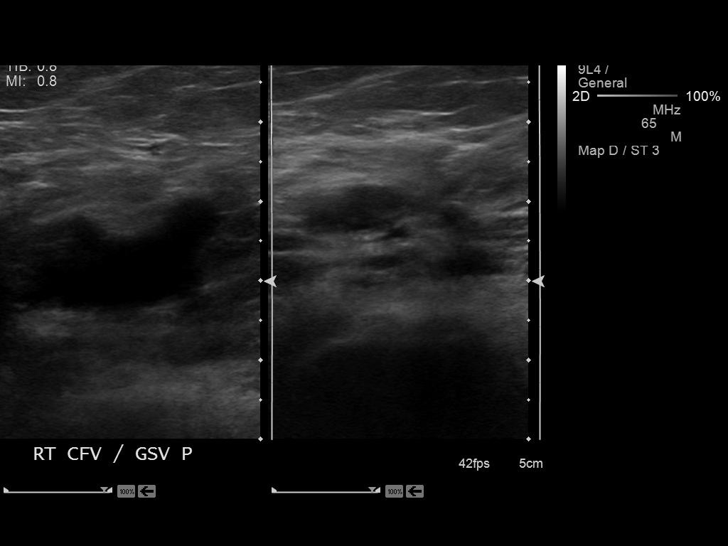
[im 3/28]
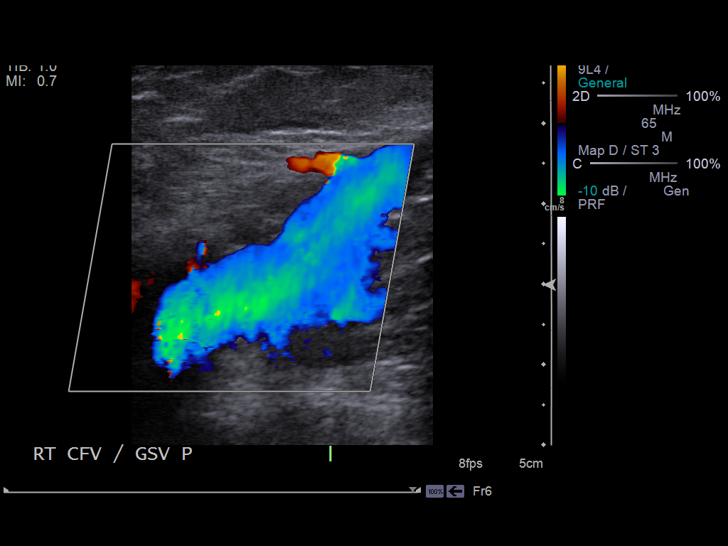
[im 5/28]
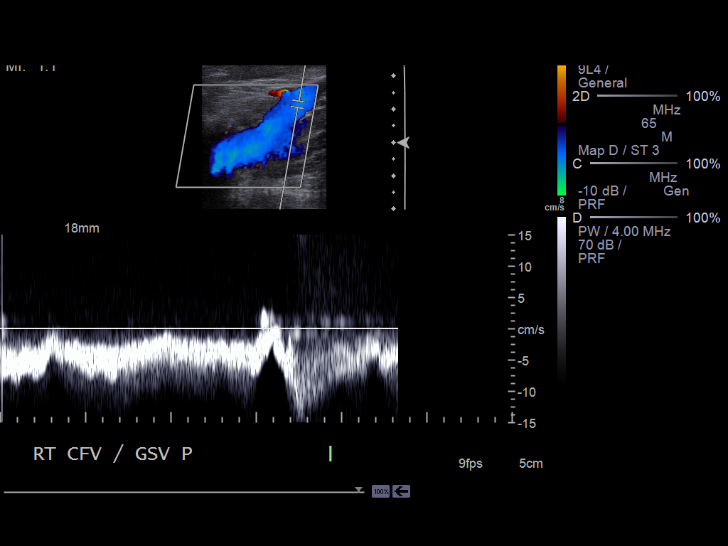
[im 8/28]
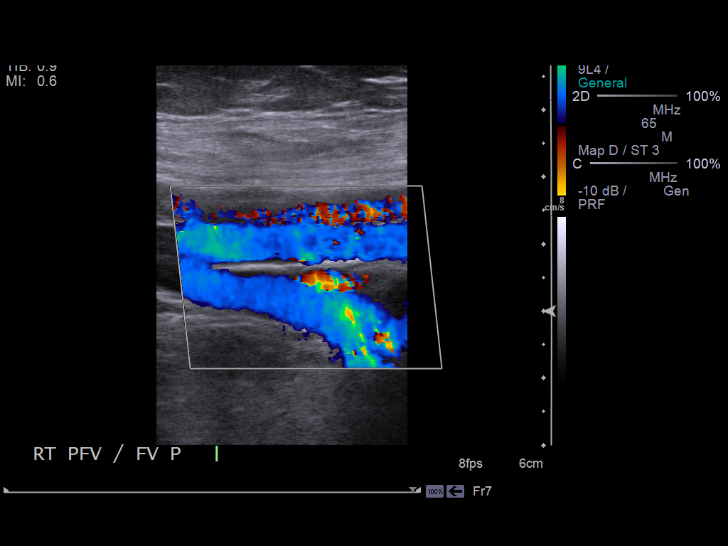
[im 9/28]
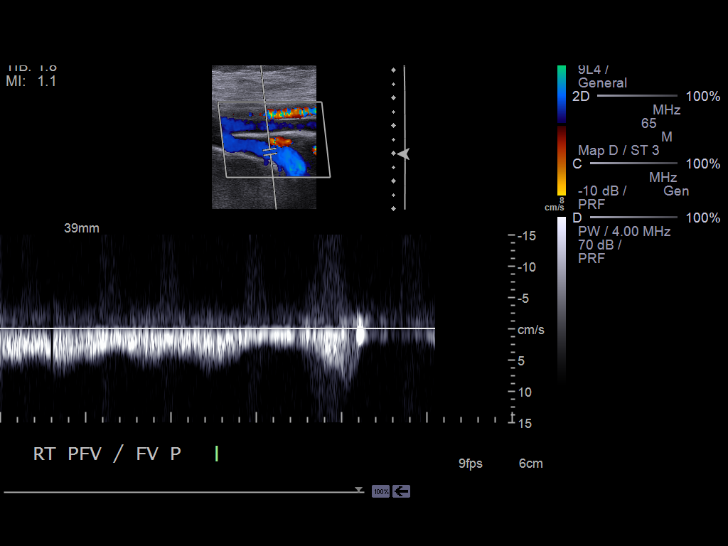
[im 11/28]
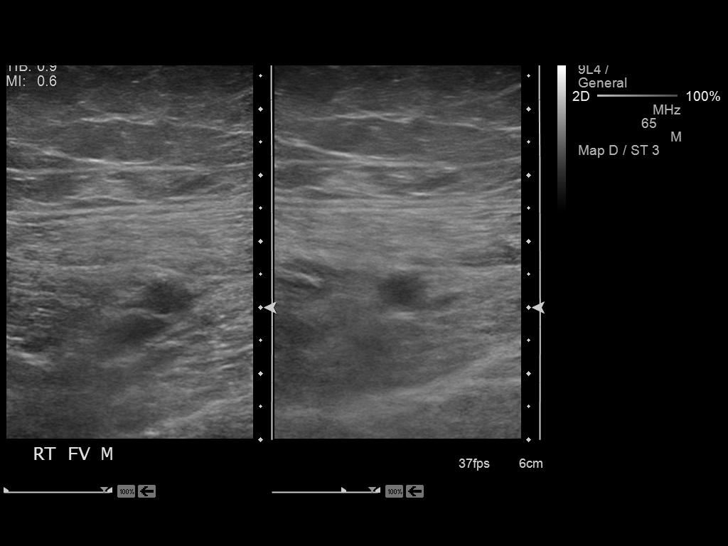
[im 13/28]
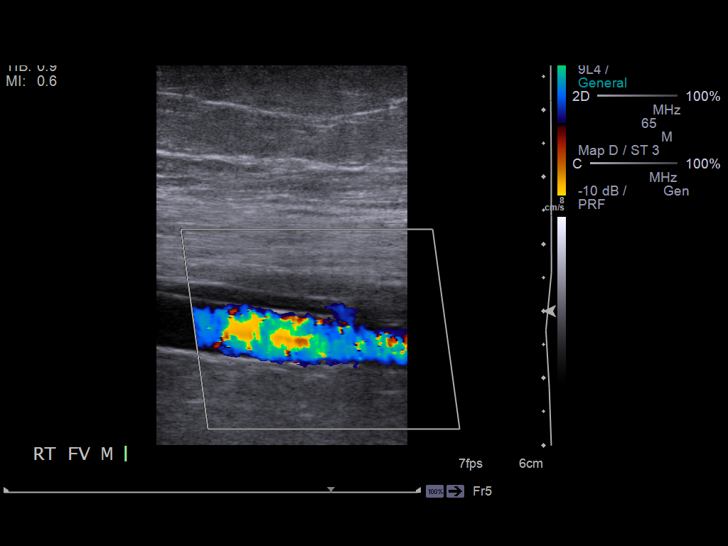
[im 15/28]
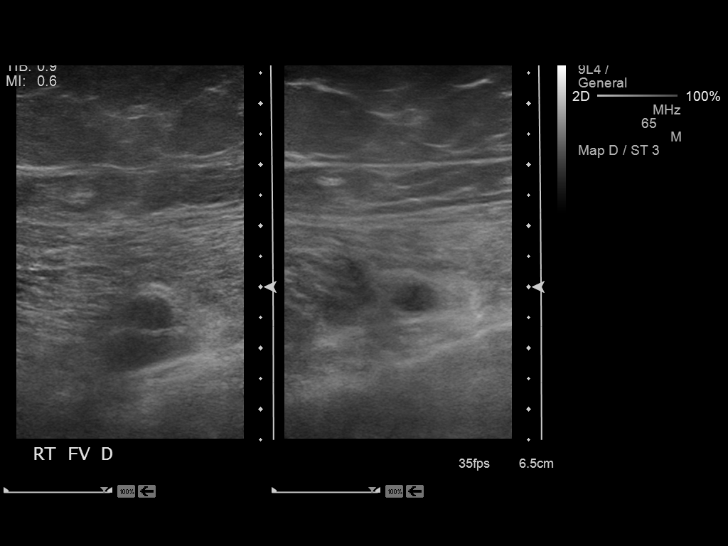
[im 17/28]
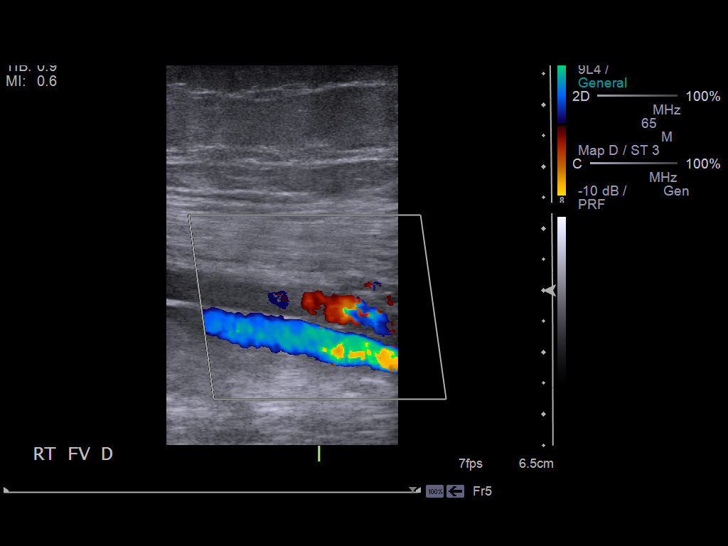
[im 19/28]
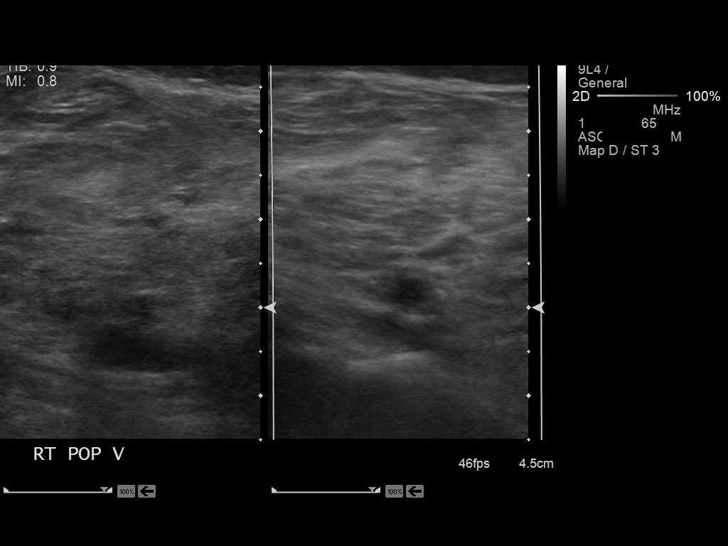
[im 22/28]
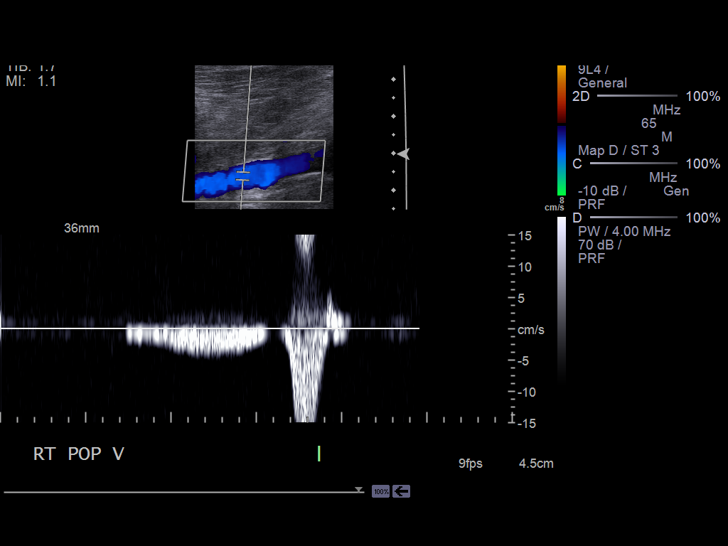
[im 23/28]
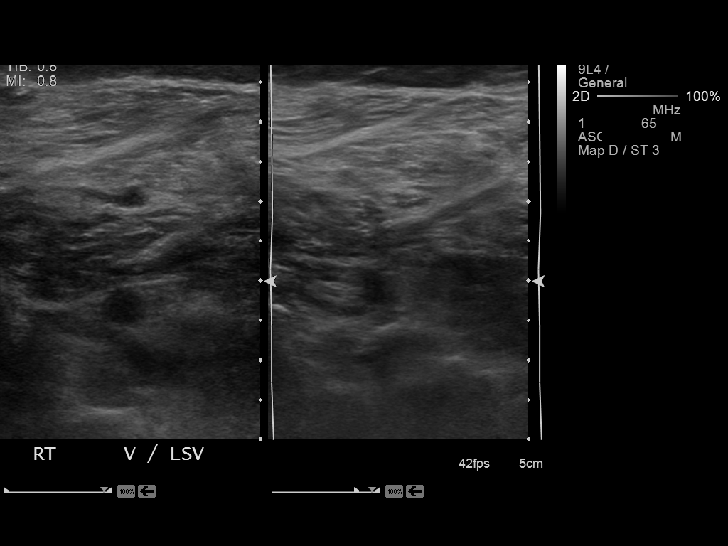
[im 25/28]
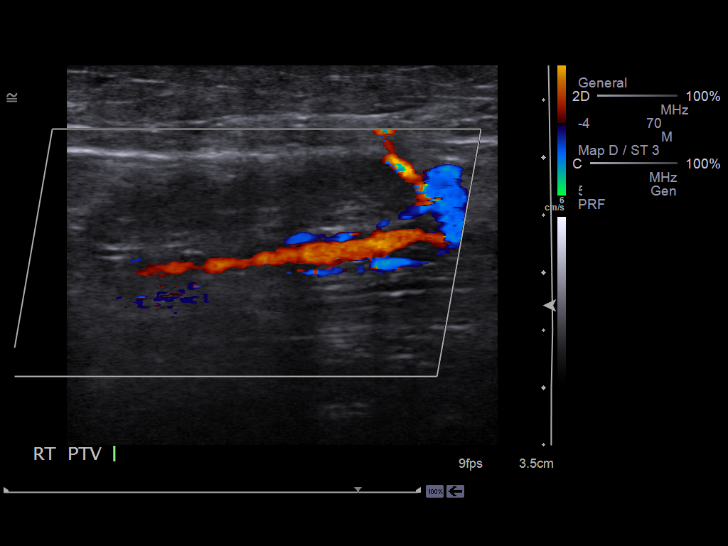
[im 28/28]
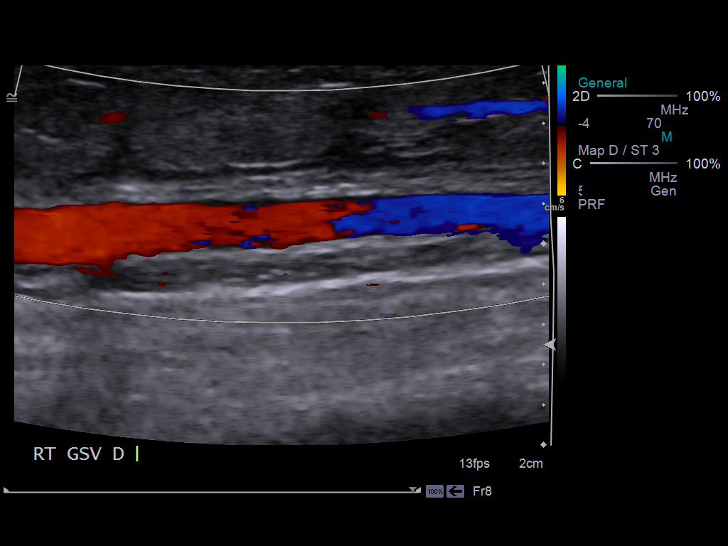

[14 of 24 positions shown; findings below may reference images not displayed]

FINDINGS: Normal compressibility and normal Doppler signal within
the common femoral, superficial femoral and popliteal veins, down
to the proximal calf veins.  No grayscale filling defects to
suggest DVT.
IMPRESSION: No evidence of right lower extremity deep vein thrombosis.

## 2014-04-13 ENCOUNTER — Ambulatory Visit (HOSPITAL_COMMUNITY)
Admission: RE | Admit: 2014-04-13 | Discharge: 2014-04-13 | Disposition: A | Discharge status: Home / Self Care | Payer: Medicare Other | Source: Ambulatory Visit | Attending: Family Medicine | Admitting: Family Medicine

## 2014-04-13 ENCOUNTER — Other Ambulatory Visit: Payer: Self-pay | Admitting: Family Medicine

## 2014-04-13 DIAGNOSIS — R937 Abnormal findings on diagnostic imaging of other parts of musculoskeletal system: Secondary | ICD-10-CM | POA: Diagnosis not present

## 2014-04-13 DIAGNOSIS — M546 Pain in thoracic spine: Secondary | ICD-10-CM | POA: Insufficient documentation

## 2014-04-14 LAB — LIPID PANEL
CHOL/HDL RATIO: 4.1 ratio
CHOLESTEROL: 164 mg/dL (ref 0–200)
HDL: 40 mg/dL (ref >39)
LDL Cholesterol: 89 mg/dL (ref 0–99)
Triglycerides: 174 mg/dL — ABNORMAL HIGH (ref <150)
VLDL: 35 mg/dL (ref 0–40)

## 2014-04-14 LAB — COMPLETE METABOLIC PANEL WITH GFR
ALBUMIN: 3.8 g/dL (ref 3.5–5.2)
ALT: 11 U/L (ref 0–35)
AST: 18 U/L (ref 0–37)
Alkaline Phosphatase: 84 U/L (ref 39–117)
BUN: 10 mg/dL (ref 6–23)
CO2: 29 mEq/L (ref 19–32)
Calcium: 9.4 mg/dL (ref 8.4–10.5)
Chloride: 99 mEq/L (ref 96–112)
Creat: 0.81 mg/dL (ref 0.50–1.10)
GFR, Est African American: 88 mL/min
GFR, Est Non African American: 76 mL/min
Glucose, Bld: 97 mg/dL (ref 70–99)
Potassium: 4.2 mEq/L (ref 3.5–5.3)
SODIUM: 139 meq/L (ref 135–145)
Total Bilirubin: 0.5 mg/dL (ref 0.2–1.2)
Total Protein: 7.1 g/dL (ref 6.0–8.3)

## 2014-04-14 LAB — CBC
HCT: 35.8 % — ABNORMAL LOW (ref 36.0–46.0)
HEMOGLOBIN: 11.9 g/dL — AB (ref 12.0–15.0)
MCH: 25.5 pg — AB (ref 26.0–34.0)
MCHC: 33.2 g/dL (ref 30.0–36.0)
MCV: 76.8 fL — ABNORMAL LOW (ref 78.0–100.0)
Platelets: 344 10*3/uL (ref 150–400)
RBC: 4.66 MIL/uL (ref 3.87–5.11)
RDW: 16.5 % — ABNORMAL HIGH (ref 11.5–15.5)
WBC: 6.8 10*3/uL (ref 4.0–10.5)

## 2014-04-14 LAB — HEMOGLOBIN A1C
Hgb A1c MFr Bld: 6.1 % — ABNORMAL HIGH (ref <5.7)
MEAN PLASMA GLUCOSE: 128 mg/dL — AB (ref <117)

## 2014-04-14 LAB — IRON: Iron: 31 ug/dL — ABNORMAL LOW (ref 42–145)

## 2014-04-14 LAB — FERRITIN: FERRITIN: 116 ng/mL (ref 10–291)

## 2014-04-21 ENCOUNTER — Other Ambulatory Visit: Payer: Self-pay

## 2014-04-21 MED ORDER — PRAVASTATIN SODIUM 20 MG PO TABS: ORAL_TABLET | ORAL | Status: DC

## 2014-04-27 ENCOUNTER — Other Ambulatory Visit: Payer: Self-pay

## 2014-04-27 DIAGNOSIS — G8190 Hemiplegia, unspecified affecting unspecified side: Secondary | ICD-10-CM

## 2014-04-27 DIAGNOSIS — M542 Cervicalgia: Secondary | ICD-10-CM

## 2014-04-27 DIAGNOSIS — E669 Obesity, unspecified: Secondary | ICD-10-CM

## 2014-04-27 DIAGNOSIS — D2110 Benign neoplasm of connective and other soft tissue of unspecified upper limb, including shoulder: Secondary | ICD-10-CM

## 2014-04-27 DIAGNOSIS — M949 Disorder of cartilage, unspecified: Secondary | ICD-10-CM

## 2014-04-27 DIAGNOSIS — M5090 Cervical disc disorder, unspecified, unspecified cervical region: Secondary | ICD-10-CM

## 2014-04-27 DIAGNOSIS — M899 Disorder of bone, unspecified: Secondary | ICD-10-CM

## 2014-04-27 DIAGNOSIS — I10 Essential (primary) hypertension: Secondary | ICD-10-CM

## 2014-04-27 DIAGNOSIS — E559 Vitamin D deficiency, unspecified: Secondary | ICD-10-CM

## 2014-04-27 DIAGNOSIS — R5383 Other fatigue: Secondary | ICD-10-CM

## 2014-04-27 MED ORDER — HYDROCODONE-ACETAMINOPHEN 5-300 MG PO TABS: ORAL_TABLET | ORAL | Status: DC

## 2014-05-08 ENCOUNTER — Other Ambulatory Visit: Payer: Self-pay | Admitting: Adult Health

## 2014-05-21 ENCOUNTER — Other Ambulatory Visit: Payer: Self-pay | Admitting: Family Medicine

## 2014-05-21 DIAGNOSIS — R921 Mammographic calcification found on diagnostic imaging of breast: Secondary | ICD-10-CM

## 2014-06-01 ENCOUNTER — Other Ambulatory Visit: Payer: Self-pay | Admitting: Family Medicine

## 2014-06-02 ENCOUNTER — Ambulatory Visit (INDEPENDENT_AMBULATORY_CARE_PROVIDER_SITE_OTHER): Payer: Medicare Other | Admitting: *Deleted

## 2014-06-02 DIAGNOSIS — Z5181 Encounter for therapeutic drug level monitoring: Secondary | ICD-10-CM

## 2014-06-02 DIAGNOSIS — I2699 Other pulmonary embolism without acute cor pulmonale: Secondary | ICD-10-CM

## 2014-06-02 LAB — POCT INR: INR: 1.9

## 2014-06-04 ENCOUNTER — Ambulatory Visit
Admission: RE | Admit: 2014-06-04 | Discharge: 2014-06-04 | Disposition: A | Discharge status: Home / Self Care | Payer: Medicare Other | Source: Ambulatory Visit | Attending: Family Medicine | Admitting: Family Medicine

## 2014-06-04 DIAGNOSIS — R921 Mammographic calcification found on diagnostic imaging of breast: Secondary | ICD-10-CM

## 2014-06-07 ENCOUNTER — Other Ambulatory Visit: Payer: Self-pay

## 2014-06-07 ENCOUNTER — Telehealth: Payer: Self-pay

## 2014-06-07 DIAGNOSIS — M949 Disorder of cartilage, unspecified: Secondary | ICD-10-CM

## 2014-06-07 DIAGNOSIS — E669 Obesity, unspecified: Secondary | ICD-10-CM

## 2014-06-07 DIAGNOSIS — D2110 Benign neoplasm of connective and other soft tissue of unspecified upper limb, including shoulder: Secondary | ICD-10-CM

## 2014-06-07 DIAGNOSIS — M542 Cervicalgia: Secondary | ICD-10-CM

## 2014-06-07 DIAGNOSIS — I10 Essential (primary) hypertension: Secondary | ICD-10-CM

## 2014-06-07 DIAGNOSIS — E559 Vitamin D deficiency, unspecified: Secondary | ICD-10-CM

## 2014-06-07 DIAGNOSIS — M5090 Cervical disc disorder, unspecified, unspecified cervical region: Secondary | ICD-10-CM

## 2014-06-07 DIAGNOSIS — M899 Disorder of bone, unspecified: Secondary | ICD-10-CM

## 2014-06-07 DIAGNOSIS — G8190 Hemiplegia, unspecified affecting unspecified side: Secondary | ICD-10-CM

## 2014-06-07 DIAGNOSIS — R5383 Other fatigue: Secondary | ICD-10-CM

## 2014-06-07 MED ORDER — HYDROCODONE-ACETAMINOPHEN 5-300 MG PO TABS: ORAL_TABLET | ORAL | Status: DC

## 2014-06-07 MED ORDER — TORSEMIDE 20 MG PO TABS: ORAL_TABLET | ORAL | Status: DC

## 2014-06-07 MED ORDER — POTASSIUM CHLORIDE CRYS ER 20 MEQ PO TBCR: 20.0000 meq | EXTENDED_RELEASE_TABLET | ORAL | Status: DC

## 2014-06-07 NOTE — Telephone Encounter (Signed)
Called and confirmed with patient what rx she needs.  Med refills sent to pharmacy.

## 2014-06-17 ENCOUNTER — Other Ambulatory Visit: Payer: Self-pay | Admitting: Family Medicine

## 2014-06-30 ENCOUNTER — Encounter: Payer: Medicare Other | Admitting: Family Medicine

## 2014-07-07 ENCOUNTER — Encounter: Payer: Self-pay | Admitting: *Deleted

## 2014-07-11 ENCOUNTER — Other Ambulatory Visit: Payer: Self-pay | Admitting: Adult Health

## 2014-07-19 ENCOUNTER — Telehealth: Payer: Self-pay

## 2014-07-19 NOTE — Telephone Encounter (Signed)
Lansoprazole not covered in 2016. Covered alternatives are pantoprazole.

## 2014-07-19 NOTE — Telephone Encounter (Signed)
pls change to protonix 40mg  daily and let her know, send in pls

## 2014-07-20 ENCOUNTER — Other Ambulatory Visit: Payer: Self-pay

## 2014-07-20 MED ORDER — PANTOPRAZOLE SODIUM 40 MG PO TBEC: 40.0000 mg | DELAYED_RELEASE_TABLET | Freq: Every day | ORAL | Status: DC

## 2014-07-20 NOTE — Telephone Encounter (Signed)
patinet aware

## 2014-07-27 ENCOUNTER — Other Ambulatory Visit: Payer: Self-pay

## 2014-07-27 MED ORDER — HYDROCODONE-ACETAMINOPHEN 10-325 MG PO TABS: 1.0000 | ORAL_TABLET | Freq: Every day | ORAL | Status: DC

## 2014-07-28 ENCOUNTER — Ambulatory Visit (INDEPENDENT_AMBULATORY_CARE_PROVIDER_SITE_OTHER): Payer: Medicare Other | Admitting: *Deleted

## 2014-07-28 DIAGNOSIS — Z5181 Encounter for therapeutic drug level monitoring: Secondary | ICD-10-CM | POA: Diagnosis not present

## 2014-07-28 LAB — POCT INR: INR: 3

## 2014-08-10 ENCOUNTER — Other Ambulatory Visit: Payer: Self-pay | Admitting: Family Medicine

## 2014-08-24 ENCOUNTER — Telehealth: Payer: Self-pay | Admitting: *Deleted

## 2014-08-24 NOTE — Telephone Encounter (Signed)
Coming for nurse visit tomorrow pm

## 2014-08-24 NOTE — Telephone Encounter (Signed)
Pt called stating she spoke with Brandi yesterday about coming today, pt is requesting to come tomorrow. Please advise

## 2014-08-25 ENCOUNTER — Ambulatory Visit (INDEPENDENT_AMBULATORY_CARE_PROVIDER_SITE_OTHER): Payer: Medicare Other

## 2014-08-25 ENCOUNTER — Ambulatory Visit (INDEPENDENT_AMBULATORY_CARE_PROVIDER_SITE_OTHER): Payer: Medicare Other | Admitting: *Deleted

## 2014-08-25 DIAGNOSIS — I2699 Other pulmonary embolism without acute cor pulmonale: Secondary | ICD-10-CM | POA: Diagnosis not present

## 2014-08-25 DIAGNOSIS — N3001 Acute cystitis with hematuria: Secondary | ICD-10-CM | POA: Diagnosis not present

## 2014-08-25 DIAGNOSIS — Z5181 Encounter for therapeutic drug level monitoring: Secondary | ICD-10-CM | POA: Diagnosis not present

## 2014-08-25 LAB — POCT URINALYSIS DIPSTICK
Glucose, UA: NEGATIVE
Nitrite, UA: NEGATIVE
PH UA: 5.5
Protein, UA: 30
Urobilinogen, UA: 1

## 2014-08-25 LAB — POCT INR: INR: 4.3

## 2014-08-25 MED ORDER — CIPROFLOXACIN HCL 500 MG PO TABS: 500.0000 mg | ORAL_TABLET | Freq: Two times a day (BID) | ORAL | Status: DC

## 2014-08-25 NOTE — Progress Notes (Signed)
UA positive- will treat for 3 days with cipro for 3 days per Dr. And send for culture

## 2014-08-29 LAB — URINE CULTURE: Colony Count: >=100000

## 2014-08-30 ENCOUNTER — Telehealth: Payer: Self-pay

## 2014-08-30 NOTE — Telephone Encounter (Signed)
Patient called c/o 2 month cough, no drainage or sickness. Starts as a tickle in her throat that makes her cough, I advised this sounded like an "ace cough" and her med would need to be changed. I advised she could use robitussin at bedtime as needed. She is on lisinopril/hctz. Please advise.

## 2014-08-31 ENCOUNTER — Other Ambulatory Visit: Payer: Self-pay

## 2014-08-31 MED ORDER — FLUCONAZOLE 150 MG PO TABS: 150.0000 mg | ORAL_TABLET | Freq: Once | ORAL | Status: DC

## 2014-09-04 ENCOUNTER — Other Ambulatory Visit: Payer: Self-pay | Admitting: Family Medicine

## 2014-09-06 ENCOUNTER — Ambulatory Visit (INDEPENDENT_AMBULATORY_CARE_PROVIDER_SITE_OTHER): Payer: Medicare Other | Admitting: Obstetrics & Gynecology

## 2014-09-06 ENCOUNTER — Encounter: Payer: Self-pay | Admitting: Obstetrics & Gynecology

## 2014-09-06 VITALS — BP 140/80 | HR 80 | Ht 66.0 in | Wt 207.0 lb

## 2014-09-06 DIAGNOSIS — A5901 Trichomonal vulvovaginitis: Secondary | ICD-10-CM

## 2014-09-06 DIAGNOSIS — R350 Frequency of micturition: Secondary | ICD-10-CM | POA: Diagnosis not present

## 2014-09-06 LAB — POCT URINALYSIS DIPSTICK
Glucose, UA: NEGATIVE
Ketones, UA: NEGATIVE
Nitrite, UA: NEGATIVE
Protein, UA: NEGATIVE

## 2014-09-06 MED ORDER — METRONIDAZOLE 500 MG PO TABS: 500.0000 mg | ORAL_TABLET | Freq: Two times a day (BID) | ORAL | Status: DC

## 2014-09-06 NOTE — Progress Notes (Signed)
Patient ID: Stephanie Singleton, female   DOB: 1946-04-15, 69 y.o.   MRN: [id number]   Chief Complaint  Patient presents with  . gyn visit    c/c treated last week for UTI. stil having frquency.Also back pain/ chills.     HPI:    Pt here after being treated 2 weeks ago for a culture confirmed urine culture sensitive to cipro and she has finished that.  She is her with frequency but more so vaginal irritation/burning Location:  vaginal. Quality:  Burning irritation. Severity:  moderate. Timing:  constant. Duration:  Last couple of weeks. Context:  No exacerbating factors. Modifying factors:   Signs/Symptoms:    Problem Pertinent ROS:       + burning with urination,  +frequency no urgency No nausea, vomiting or diarrhea Nor fever chills or other constitutional symptoms No discharge   Extended ROS:        [place]:             Past Medical History  Diagnosis Date  . Tumor of soft tissue of neck   . Embolism - blood clot 2004    Pulmonary .on coumadin  . OSA (obstructive sleep apnea) DX 8 YRS AGO.DOES NOT WEAR MASK  . Diabetes mellitus DX X 1 YR    USE METFORMIN ACCORDING TO CBG RESULT  . GERD (gastroesophageal reflux disease)     ON PREVACID  . 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 Hypertension     CARDIOLOGY VISIT.CARDIAC CLEARANCE.LOVENOX INSTRUCTIONS.  Marland Kitchen HTN (hypertension)     EKG 10/23 IN EPIC  . Hyperlipidemia     Past Surgical History  Procedure Laterality Date  . Cervical fusion      OB History    No data available      Allergies  Allergen Reactions  . Aspirin Nausea And Vomiting    History   Social History  . Marital Status: Divorced    Spouse Name: N/A  . Number of Children: 31  . Years of Education: N/A   Occupational History  .      Pastor   Social History Main Topics  . Smoking status: Former Research scientist (life sciences)  . Smokeless tobacco: Never Used  . Alcohol Use: No  . Drug Use: No  .  Sexual Activity: No   Other Topics Concern  . None   Social History Narrative    Family History  Problem Relation Age of Onset  . Heart disease Mother   . Hypertension Mother   . Stroke Mother   . Diabetes Father   . Peripheral vascular disease Father   . Peripheral vascular disease Brother   . Heart disease Brother      Examination:  Vitals:  Blood pressure 140/80, pulse 80, height 5\' 6"  (1.676 m), weight 207 lb (93.895 kg).    Physical Examination:     Abdomen:  Soft, non-tender, normal bowel sounds; no bruits, organomegaly or masses. Vulva:  NEFG, normal appearing vulva with no masses, tenderness or lesions,  Vagina:  normal mucosa, thin grey discharge, scant discharge,   Wet Prep:   A sample of vaginal discharge was obtained from the posterior fornix using a cotton swab. 2 drops of saline were placed on a slide and the cotton swab was immersed in the saline. Microscopic evaluation was performed and results were as follows:  Negative  for yeast  Negative for clue cells , consistent with Bacterial vaginosis  Positive for trichomonas  Normal WBC population   Whiff test: Negative    DATA orders and reviews: Labs were ordered today:  Wet prep, urinalysis, urine culture Imaging studies were not ordered today:    Lab tests were reviewed today:   Urine culture Imaging studies were not reviewed today:    I  independently review/view images, tracing or specimen(not simply the report) myself.  Prescription Drug Management:  New Prescriptions: metronidazole 500 BID x 7days, plus refill for her partner Renewed Prescriptions:   Current prescription changes:     Impression/Plan(Problem Based): 1.  Trichomonas vaginitis      (new problem) : Additional workup is needed:  Repeat wet prep 3 weeks  2.  UTI      (follow up of a pre-existing problem) : Additional workup is needed:  Urine culture is pending  Follow Up:   3  weeks

## 2014-09-07 ENCOUNTER — Ambulatory Visit (INDEPENDENT_AMBULATORY_CARE_PROVIDER_SITE_OTHER): Payer: Medicare Other | Admitting: Family Medicine

## 2014-09-07 ENCOUNTER — Telehealth: Payer: Self-pay | Admitting: Family Medicine

## 2014-09-07 ENCOUNTER — Encounter: Payer: Self-pay | Admitting: Family Medicine

## 2014-09-07 VITALS — BP 142/84 | HR 94 | Resp 18 | Ht 66.0 in | Wt 204.0 lb

## 2014-09-07 DIAGNOSIS — Z0000 Encounter for general adult medical examination without abnormal findings: Secondary | ICD-10-CM

## 2014-09-07 DIAGNOSIS — Z8719 Personal history of other diseases of the digestive system: Secondary | ICD-10-CM

## 2014-09-07 DIAGNOSIS — I10 Essential (primary) hypertension: Secondary | ICD-10-CM

## 2014-09-07 DIAGNOSIS — E559 Vitamin D deficiency, unspecified: Secondary | ICD-10-CM

## 2014-09-07 DIAGNOSIS — E119 Type 2 diabetes mellitus without complications: Secondary | ICD-10-CM

## 2014-09-07 DIAGNOSIS — E785 Hyperlipidemia, unspecified: Secondary | ICD-10-CM

## 2014-09-07 DIAGNOSIS — M810 Age-related osteoporosis without current pathological fracture: Secondary | ICD-10-CM

## 2014-09-07 DIAGNOSIS — Z2821 Immunization not carried out because of patient refusal: Secondary | ICD-10-CM

## 2014-09-07 DIAGNOSIS — M542 Cervicalgia: Secondary | ICD-10-CM

## 2014-09-07 MED ORDER — PRAVASTATIN SODIUM 20 MG PO TABS: ORAL_TABLET | ORAL | Status: DC

## 2014-09-07 MED ORDER — ALENDRONATE SODIUM 70 MG PO TABS: 70.0000 mg | ORAL_TABLET | ORAL | Status: DC

## 2014-09-07 MED ORDER — LOSARTAN POTASSIUM 100 MG PO TABS: 100.0000 mg | ORAL_TABLET | Freq: Every day | ORAL | Status: DC

## 2014-09-07 MED ORDER — HYDROCODONE-ACETAMINOPHEN 10-325 MG PO TABS: 1.0000 | ORAL_TABLET | Freq: Two times a day (BID) | ORAL | Status: DC

## 2014-09-07 NOTE — Patient Instructions (Addendum)
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

## 2014-09-07 NOTE — Progress Notes (Signed)
Subjective:    Patient ID: Stephanie Singleton, female    DOB: 01/09/1946, 69 y.o.   MRN: [id number]  HPI  Preventive Screening-Counseling & Management   Patient present here today for a Medicare annual wellness visit. C/o increased and uncontrolled neck pain, no aggravating factor known, needs increase in chronic pain med dose Currently being treated an evaluated by gyne for vaginitis, recently sexually active after over 10 years   Current Problems (verified)   Medications Prior to Visit Allergies (verified)   PAST HISTORY  Family History (verified and updated)   Social History retired mother of 13   Risk Factors  Current exercise habits:  Twice weekly, approx 45 mins total  Dietary issues discussed:  Heart healthy low carb diet    Cardiac risk factors: DM   Depression Screen  (Note: if answer to either of the following is "Yes", a more complete depression screening is indicated)   Over the past two weeks, have you felt down, depressed or hopeless? No  Over the past two weeks, have you felt little interest or pleasure in doing things? No  Have you lost interest or pleasure in daily life? No  Do you often feel hopeless? No  Do you cry easily over simple problems? No   Activities of Daily Living  In your present state of health, do you have any difficulty performing the following activities?  Driving?: No Managing money?: No Feeding yourself?:No Getting from bed to chair?:No Climbing a flight of stairs?:yes at times due to lower extremity pain Preparing food and eating?:No Bathing or showering?:No Getting dressed?:No Getting to the toilet?:No Using the toilet?:No Moving around from place to place?: No  Fall Risk Assessment In the past year have you fallen or had a near fall?:No Are you currently taking any medications that make you dizzy?:No   Hearing Difficulties: No Do you often ask people to speak up or repeat themselves?:No Do you experience ringing  or noises in your ears?:No Do you have difficulty understanding soft or whispered voices?:No  Cognitive Testing  Alert? Yes Normal Appearance?Yes  Oriented to person? Yes Place? Yes  Time? Yes  Displays appropriate judgment?Yes  Can read the correct time from a watch face? yes Are you having problems remembering things?No  Advanced Directives have been discussed with the patient?Yes and brochure given and discussed  Full code  List the Names of Other Physician/Practitioners you currently use: Care Teams updated    Indicate any recent Medical Services you may have received from other than [facility] providers in the past year (date may be approximate).   Assessment:    Annual Wellness Exam   Plan:    .  Medicare Attestation  I have personally reviewed:  The patient's medical and social history  Their use of alcohol, tobacco or illicit drugs  Their current medications and supplements  The patient's functional ability including ADLs,fall risks, home safety risks, cognitive, and hearing and visual impairment  Diet and physical activities  Evidence for depression or mood disorders  The patient's weight, height, BMI, and visual acuity have been recorded in the chart. I have made referrals, counseling, and provided education to the patient based on review of the above and I have provided the patient with a written personalized care plan for preventive services.     Review of Systems     Objective:   Physical Exam BP 142/84 mmHg  Pulse 94  Resp 18  Ht 5\' 6"  (1.676 m)  Wt 204 lb (92.534  kg)  BMI 32.94 kg/m2  SpO2 98%  MS: decreased ROM c spine with bilateral spasm       Assessment & Plan:  Medicare annual wellness visit, subsequent Annual exam as documented. Counseling done  re healthy lifestyle involving commitment to 150 minutes exercise per week, heart healthy diet, and attaining healthy weight.The importance of adequate sleep also discussed. Regular seat belt use and  home safety, is also discussed. Changes in health habits are decided on by the patient with goals and time frames  set for achieving them. Immunization and cancer screening needs are specifically addressed at this visit.    Hypertension goal BP (blood pressure) < 130/80 Uncontrolled, medication increased with 4 week follow up DASH diet and commitment to daily physical activity for a minimum of 30 minutes discussed and encouraged, as a part of hypertension management. The importance of attaining a healthy weight is also discussed.  BP/Weight 09/28/2014 09/20/2014 09/07/2014 09/06/2014 04/05/2014 10/31/1019 07/02/7354  Systolic BP 701 410 [phone]  Diastolic BP 80 282 84 80 78 88 68  Wt. (Lbs) 206.4 207 204 207 208 204 216  BMI 33.33 33.43 32.94 33.43 33.59 32.94 34.88    CMP Latest Ref Rng 09/28/2014 04/13/2014 10/23/2013  Glucose 70 - 99 mg/dL - 97 122(H)  BUN 6 - 23 mg/dL - 10 14  Creatinine 0.50 - 1.10 mg/dL - 0.81 0.86  Sodium 135 - 145 mEq/L - 139 138  Potassium 3.5 - 5.3 mEq/L - 4.2 4.0  Chloride 96 - 112 mEq/L - 99 100  CO2 19 - 32 mEq/L - 29 26  Calcium 8.4 - 10.5 mg/dL - 9.4 9.4  Total Protein 6.0 - 8.5 g/dL 7.9 7.1 7.3  Albumin 3.6 - 4.8 g/dL 4.6 - -  Total Bilirubin - WILL FOLLOW 0.5 0.4  Alkaline Phos 39 - 117 IU/L 106 84 101  AST 0 - 40 IU/L 20 18 15   ALT 0 - 32 IU/L 9 11 9         Vaccination not carried out because of patient refusal Pt continues to refuse immunizations which are age appropriate and recommended. Information provided

## 2014-09-07 NOTE — Telephone Encounter (Signed)
Noted  

## 2014-09-07 NOTE — Telephone Encounter (Signed)
Will be addresed at ov in system f ro tofday , she is overdue

## 2014-09-15 ENCOUNTER — Ambulatory Visit (INDEPENDENT_AMBULATORY_CARE_PROVIDER_SITE_OTHER): Payer: Medicare Other | Admitting: *Deleted

## 2014-09-15 DIAGNOSIS — I2699 Other pulmonary embolism without acute cor pulmonale: Secondary | ICD-10-CM | POA: Diagnosis not present

## 2014-09-15 DIAGNOSIS — Z5181 Encounter for therapeutic drug level monitoring: Secondary | ICD-10-CM | POA: Diagnosis not present

## 2014-09-15 LAB — POCT INR: INR: 3

## 2014-09-20 ENCOUNTER — Ambulatory Visit (INDEPENDENT_AMBULATORY_CARE_PROVIDER_SITE_OTHER): Payer: Medicare Other | Admitting: Obstetrics & Gynecology

## 2014-09-20 ENCOUNTER — Encounter: Payer: Self-pay | Admitting: Obstetrics & Gynecology

## 2014-09-20 VITALS — BP 160/100 | Wt 207.0 lb

## 2014-09-20 DIAGNOSIS — A5901 Trichomonal vulvovaginitis: Secondary | ICD-10-CM

## 2014-09-20 DIAGNOSIS — Z7251 High risk heterosexual behavior: Secondary | ICD-10-CM

## 2014-09-20 NOTE — Progress Notes (Signed)
Patient ID: Stephanie Singleton, female   DOB: Sep 13, 1945, 69 y.o.   MRN: [id number] Pt is s/p metronidazole for trichomonas for 1 week Partner also took it  Here for follow up of wet prep  No burning with urination, frequency or urgency No nausea, vomiting or diarrhea Nor fever chills or other constitutional symptoms   NEFG No vaginal discharge Cervix normal  Wet Prep:   A sample of vaginal discharge was obtained from the posterior fornix using a cotton swab. 2 drops of saline were placed on a slide and the cotton swab was immersed in the saline. Microscopic evaluation was performed and results were as follows:  Negative  for yeast  Negative for clue cells , consistent with Bacterial vaginosis Negative for trichomonas  Normal WBC population   Whiff test: Negative

## 2014-09-21 DIAGNOSIS — Z118 Encounter for screening for other infectious and parasitic diseases: Secondary | ICD-10-CM | POA: Diagnosis not present

## 2014-09-21 DIAGNOSIS — Z7251 High risk heterosexual behavior: Secondary | ICD-10-CM | POA: Diagnosis not present

## 2014-09-21 MED ORDER — LOSARTAN POTASSIUM 100 MG PO TABS: 100.0000 mg | ORAL_TABLET | Freq: Every day | ORAL | Status: DC

## 2014-09-21 NOTE — Addendum Note (Signed)
Addended by: Farley Ly on: 09/21/2014 10:59 AM   Modules accepted: Orders

## 2014-09-22 DIAGNOSIS — Z7251 High risk heterosexual behavior: Secondary | ICD-10-CM | POA: Diagnosis not present

## 2014-09-23 LAB — RPR: RPR: NONREACTIVE

## 2014-09-23 LAB — COMMENT3 - HEP PANEL

## 2014-09-23 LAB — GC/CHLAMYDIA PROBE AMP
Chlamydia trachomatis, NAA: NEGATIVE
NEISSERIA GONORRHOEAE BY PCR: NEGATIVE

## 2014-09-23 LAB — HEPATITIS C ANTIBODY (REFLEX): HCV AB: 9.8 {s_co_ratio} — AB (ref 0.0–0.9)

## 2014-09-23 LAB — HCV AB VERIFICATION: HCV Antibody Verification: REACTIVE — AB

## 2014-09-23 LAB — HIV ANTIBODY (ROUTINE TESTING W REFLEX): HIV Screen 4th Generation wRfx: NONREACTIVE

## 2014-09-23 LAB — HSV 2 ANTIBODY, IGG: HSV 2 GLYCOPROTEIN G AB, IGG: 22.8 {index} — AB (ref 0.00–0.90)

## 2014-09-26 ENCOUNTER — Other Ambulatory Visit: Payer: Self-pay | Admitting: Family Medicine

## 2014-09-27 ENCOUNTER — Telehealth: Payer: Self-pay | Admitting: *Deleted

## 2014-09-27 ENCOUNTER — Ambulatory Visit: Payer: Medicare Other | Admitting: Obstetrics & Gynecology

## 2014-09-27 NOTE — Telephone Encounter (Signed)
Spoke with pt letting her know to make an appt to discuss results per Dr. Elonda Husky. Call transferred to front desk for appt. Stephanie Singleton

## 2014-09-27 NOTE — Telephone Encounter (Signed)
Please call pt back and tell her to make appointment to discuss results

## 2014-09-28 ENCOUNTER — Encounter: Payer: Self-pay | Admitting: Obstetrics & Gynecology

## 2014-09-28 ENCOUNTER — Ambulatory Visit (INDEPENDENT_AMBULATORY_CARE_PROVIDER_SITE_OTHER): Payer: Medicare Other | Admitting: Obstetrics & Gynecology

## 2014-09-28 VITALS — BP 140/80 | HR 72 | Wt 206.4 lb

## 2014-09-28 DIAGNOSIS — B1920 Unspecified viral hepatitis C without hepatic coma: Secondary | ICD-10-CM | POA: Diagnosis not present

## 2014-09-28 DIAGNOSIS — R894 Abnormal immunological findings in specimens from other organs, systems and tissues: Secondary | ICD-10-CM | POA: Diagnosis not present

## 2014-09-28 DIAGNOSIS — R768 Other specified abnormal immunological findings in serum: Secondary | ICD-10-CM

## 2014-09-28 DIAGNOSIS — R769 Abnormal immunological finding in serum, unspecified: Secondary | ICD-10-CM | POA: Diagnosis not present

## 2014-09-28 NOTE — Progress Notes (Signed)
Patient ID: Stephanie Singleton, female   DOB: April 30, 1946, 69 y.o.   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  All questions were answered  Additionally quantitative RNA hepatitis C test is ordered as are LFTs.    After these labs are back will have follow up with infectious disease colleagues in [place] for further evaluation and treatment  Letter to Dr Moshe Cipro today     Face to face time:  15 minutes  Greater than 50% of the visit time was spent in counseling and coordination of care with the patient.  The summary and outline of the counseling and care coordination is summarized in the note above.   All questions were answered.

## 2014-10-03 DIAGNOSIS — M542 Cervicalgia: Secondary | ICD-10-CM | POA: Insufficient documentation

## 2014-10-03 DIAGNOSIS — M810 Age-related osteoporosis without current pathological fracture: Secondary | ICD-10-CM | POA: Insufficient documentation

## 2014-10-03 NOTE — Assessment & Plan Note (Signed)
Pt continues to refuse immunizations which are age appropriate and recommended. Information provided

## 2014-10-03 NOTE — Assessment & Plan Note (Signed)

## 2014-10-03 NOTE — Assessment & Plan Note (Signed)
Uncontrolled, medication increased with 4 week follow up DASH diet and commitment to daily physical activity for a minimum of 30 minutes discussed and encouraged, as a part of hypertension management. The importance of attaining a healthy weight is also discussed.  BP/Weight 09/28/2014 09/20/2014 09/07/2014 09/06/2014 04/05/2014 11/06/8500 01/06/4127  Systolic BP 786 767 209 470 962 836 629  Diastolic BP 80 476 84 80 78 88 68  Wt. (Lbs) 206.4 207 204 207 208 204 216  BMI 33.33 33.43 32.94 33.43 33.59 32.94 34.88    CMP Latest Ref Rng 09/28/2014 04/13/2014 10/23/2013  Glucose 70 - 99 mg/dL - 97 122(H)  BUN 6 - 23 mg/dL - 10 14  Creatinine 0.50 - 1.10 mg/dL - 0.81 0.86  Sodium 135 - 145 mEq/L - 139 138  Potassium 3.5 - 5.3 mEq/L - 4.2 4.0  Chloride 96 - 112 mEq/L - 99 100  CO2 19 - 32 mEq/L - 29 26  Calcium 8.4 - 10.5 mg/dL - 9.4 9.4  Total Protein 6.0 - 8.5 g/dL 7.9 7.1 7.3  Albumin 3.6 - 4.8 g/dL 4.6 - -  Total Bilirubin - WILL FOLLOW 0.5 0.4  Alkaline Phos 39 - 117 IU/L 106 84 101  AST 0 - 40 IU/L 20 18 15   ALT 0 - 32 IU/L 9 11 9

## 2014-10-04 ENCOUNTER — Other Ambulatory Visit: Payer: Self-pay

## 2014-10-04 MED ORDER — HYDROCODONE-ACETAMINOPHEN 10-325 MG PO TABS
1.0000 | ORAL_TABLET | Freq: Two times a day (BID) | ORAL | Status: DC
Start: 2014-10-04 — End: 2014-12-14

## 2014-10-05 ENCOUNTER — Telehealth: Payer: Self-pay | Admitting: Obstetrics & Gynecology

## 2014-10-05 ENCOUNTER — Ambulatory Visit: Payer: Medicare Other | Admitting: Family Medicine

## 2014-10-05 LAB — HEPATIC FUNCTION PANEL
ALT: 9 IU/L (ref 0–32)
AST: 20 IU/L (ref 0–40)
Albumin: 4.6 g/dL (ref 3.6–4.8)
Alkaline Phosphatase: 106 IU/L (ref 39–117)
BILIRUBIN, DIRECT: 0.1 mg/dL (ref 0.00–0.40)
Total Protein: 7.9 g/dL (ref 6.0–8.5)

## 2014-10-05 LAB — HEPATITIS C RNA QUANTITATIVE: HEPATITIS C QUANTITATION: NOT DETECTED [IU]/mL

## 2014-10-31 ENCOUNTER — Emergency Department (HOSPITAL_COMMUNITY)
Admission: EM | Admit: 2014-10-31 | Discharge: 2014-10-31 | Disposition: A | Discharge status: Home / Self Care | Payer: Medicare Other | Attending: Emergency Medicine | Admitting: Emergency Medicine

## 2014-10-31 ENCOUNTER — Encounter (HOSPITAL_COMMUNITY): Payer: Self-pay

## 2014-10-31 DIAGNOSIS — E785 Hyperlipidemia, unspecified: Secondary | ICD-10-CM | POA: Insufficient documentation

## 2014-10-31 DIAGNOSIS — Z7983 Long term (current) use of bisphosphonates: Secondary | ICD-10-CM | POA: Insufficient documentation

## 2014-10-31 DIAGNOSIS — R040 Epistaxis: Secondary | ICD-10-CM | POA: Diagnosis not present

## 2014-10-31 DIAGNOSIS — Z79899 Other long term (current) drug therapy: Secondary | ICD-10-CM | POA: Diagnosis not present

## 2014-10-31 DIAGNOSIS — R51 Headache: Secondary | ICD-10-CM | POA: Insufficient documentation

## 2014-10-31 DIAGNOSIS — Z7901 Long term (current) use of anticoagulants: Secondary | ICD-10-CM | POA: Diagnosis not present

## 2014-10-31 DIAGNOSIS — K219 Gastro-esophageal reflux disease without esophagitis: Secondary | ICD-10-CM | POA: Diagnosis not present

## 2014-10-31 DIAGNOSIS — Z8603 Personal history of neoplasm of uncertain behavior: Secondary | ICD-10-CM | POA: Diagnosis not present

## 2014-10-31 DIAGNOSIS — E119 Type 2 diabetes mellitus without complications: Secondary | ICD-10-CM | POA: Insufficient documentation

## 2014-10-31 DIAGNOSIS — Z87891 Personal history of nicotine dependence: Secondary | ICD-10-CM | POA: Diagnosis not present

## 2014-10-31 DIAGNOSIS — Z8669 Personal history of other diseases of the nervous system and sense organs: Secondary | ICD-10-CM | POA: Diagnosis not present

## 2014-10-31 DIAGNOSIS — I10 Essential (primary) hypertension: Secondary | ICD-10-CM | POA: Insufficient documentation

## 2014-10-31 LAB — CBC
HCT: 38 % (ref 36.0–46.0)
Hemoglobin: 13.2 g/dL (ref 12.0–15.0)
MCH: 27 pg (ref 26.0–34.0)
MCHC: 34.7 g/dL (ref 30.0–36.0)
MCV: 77.7 fL — AB (ref 78.0–100.0)
Platelets: 340 10*3/uL (ref 150–400)
RBC: 4.89 MIL/uL (ref 3.87–5.11)
RDW: 15.1 % (ref 11.5–15.5)
WBC: 7.6 10*3/uL (ref 4.0–10.5)

## 2014-10-31 LAB — PROTIME-INR
INR: 2.1 — AB (ref 0.00–1.49)
Prothrombin Time: 23.7 seconds — ABNORMAL HIGH (ref 11.6–15.2)

## 2014-10-31 MED ORDER — OXYMETAZOLINE HCL 0.05 % NA SOLN
2.0000 | Freq: Once | NASAL | Status: AC
  Administered 2014-10-31: 2 via NASAL
  Filled 2014-10-31: qty 15

## 2014-10-31 MED ORDER — ACETAMINOPHEN 325 MG PO TABS
650.0000 mg | ORAL_TABLET | Freq: Once | ORAL | Status: AC
  Administered 2014-10-31: 650 mg via ORAL
  Filled 2014-10-31: qty 2

## 2014-10-31 NOTE — ED Notes (Signed)
pts left nare packed with gauze pt instructed to apply pressure. Bleeding is controlled.

## 2014-10-31 NOTE — ED Notes (Signed)
Family at bedside. 

## 2014-10-31 NOTE — ED Provider Notes (Signed)
CSN: [id number]     Arrival date & time 10/31/14  1307 History   None    Chief Complaint  Patient presents with  . Epistaxis  . Headache     (Consider location/radiation/quality/duration/timing/severity/associated sxs/prior Treatment) Patient is a 69 y.o. female presenting with nosebleeds. The history is provided by the patient. No language interpreter was used.  Epistaxis Location:  L nare Severity:  Mild Timing:  Intermittent Progression:  Waxing and waning Chronicity:  New Context: anticoagulants and hypertension   Context: not drug use, not elevation change, not foreign body, not home oxygen, not recent infection and not trauma   Relieved by:  Applying pressure Worsened by:  Nothing tried Ineffective treatments:  None tried Associated symptoms: headaches   Associated symptoms: no blood in oropharynx, no cough, no dizziness, no facial pain, no fever, no sinus pain, no sneezing, no sore throat and no syncope   Risk factors: no alcohol use, no change in medication, no frequent nosebleeds, no head and neck surgery, no radiation treatment and no recent chemotherapy     Past Medical History  Diagnosis Date  . Tumor of soft tissue of neck   . Embolism - blood clot 2004    Pulmonary .on coumadin  . OSA (obstructive sleep apnea) DX 8 YRS AGO.DOES NOT WEAR MASK  . Diabetes mellitus DX X 1 YR    USE METFORMIN ACCORDING TO CBG RESULT  . GERD (gastroesophageal reflux disease)     ON PREVACID  . 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 Hypertension     CARDIOLOGY VISIT.CARDIAC CLEARANCE.LOVENOX INSTRUCTIONS.  Marland Kitchen HTN (hypertension)     EKG 10/23 IN EPIC  . Hyperlipidemia    Past Surgical History  Procedure Laterality Date  . Cervical fusion     Family History  Problem Relation Age of Onset  . Heart disease Mother   . Hypertension Mother   . Stroke Mother   . Diabetes Father   . Peripheral vascular disease Father   .  Peripheral vascular disease Brother   . Heart disease Brother    History  Substance Use Topics  . Smoking status: Former Research scientist (life sciences)  . Smokeless tobacco: Never Used  . Alcohol Use: No   OB History    No data available     Review of Systems  Constitutional: Negative for fever and fatigue.  HENT: Positive for nosebleeds. Negative for rhinorrhea, sinus pressure, sneezing and sore throat.   Respiratory: Negative for cough, chest tightness and shortness of breath.   Cardiovascular: Negative for chest pain and syncope.  Gastrointestinal: Negative for nausea, vomiting, abdominal pain, blood in stool and anal bleeding.  Genitourinary: Negative for hematuria and vaginal bleeding.  Musculoskeletal: Negative for gait problem.  Neurological: Positive for headaches. Negative for dizziness, seizures, syncope, speech difficulty, weakness, light-headedness and numbness.  Psychiatric/Behavioral: Negative for confusion.  All other systems reviewed and are negative.     Allergies  Aspirin  Home Medications   Prior to Admission medications   Medication Sig Start Date End Date Taking? Authorizing Provider  ACCU-CHEK AVIVA PLUS test strip TEST ONCE DAILY 08/10/14   Fayrene Helper, MD  alendronate (FOSAMAX) 70 MG tablet Take 1 tablet (70 mg total) by mouth every 7 (seven) days. Take with a full glass of water on an empty stomach. 09/07/14   Fayrene Helper, MD  gabapentin (NEURONTIN) 300 MG capsule TAKE 1 CAPSULE TWICE A DAY 09/27/14  Fayrene Helper, MD  HYDROcodone-acetaminophen [facility]) 10-325 MG per tablet Take 1 tablet by mouth 2 (two) times daily. 10/04/14   Fayrene Helper, MD  Lancets (ACCU-CHEK MULTICLIX) lancets USE AS DIRECTED DAILY 07/10/13   Fayrene Helper, MD  losartan (COZAAR) 100 MG tablet Take 1 tablet (100 mg total) by mouth daily. 09/21/14   Fayrene Helper, MD  metFORMIN (GLUCOPHAGE) 500 MG tablet Take 1 tablet (500 mg total) by mouth 2 (two) times daily with a  meal. Patient not taking: Reported on 09/28/2014 11/18/13   Fayrene Helper, MD  metFORMIN (GLUCOPHAGE) 500 MG tablet TAKE 1 TABLET BY MOUTH EVERY DAY 06/01/14   Fayrene Helper, MD  pantoprazole (PROTONIX) 40 MG tablet Take 1 tablet (40 mg total) by mouth daily. 07/20/14   Fayrene Helper, MD  potassium chloride SA (K-DUR,KLOR-CON) 20 MEQ tablet Take 1 tablet (20 mEq total) by mouth 2 (two) times a week. *Take only on the days that Demadex (TORSEMIDE) is taken* 06/07/14   Fayrene Helper, MD  pravastatin (PRAVACHOL) 20 MG tablet TAKE 1 TABLET BY MOUTH DAILY 09/07/14   Fayrene Helper, MD  tiZANidine (ZANAFLEX) 4 MG tablet TAKE 1 TABLET BY MOUTH EVERY 8 HOURS AS NEEDED FOR MUSCLE SPASMS    Fayrene Helper, MD  torsemide (DEMADEX) 20 MG tablet TAKE 1 TABLET 2 TIMES A WEEK AS NEEDED FOR SWELLING 09/06/14   Fayrene Helper, MD  warfarin (COUMADIN) 5 MG tablet Take 1/2 tablet daily except 1 tablet on Wednesdays 07/12/14   Lendon Colonel, NP   BP 189/94 mmHg  Pulse 57  Temp(Src) 98 F (36.7 C) (Oral)  Resp 20  Ht 5\' 6"  (1.676 m)  SpO2 100%  Filed Vitals:   10/31/14 1354 10/31/14 1500  BP: 189/94 166/93  Pulse: 57 58  Temp: 98 F (36.7 C)   TempSrc: Oral   Resp: 20   Height: 5\' 6"  (1.676 m)   SpO2: 100% 100%     Physical Exam  Constitutional: Vital signs are normal. She appears well-developed and well-nourished. She does not appear ill. No distress.  HENT:  Head: Normocephalic and atraumatic.  Mouth/Throat: Oropharynx is clear and moist. No oropharyngeal exudate.  Scant dried blood at the left distal nare.  No nasalseptal hematoma.  No active bleeding.  Nontender and no swelling to the nasal bridge.  No blood in the oropharynx.   Eyes: EOM are normal. Pupils are equal, round, and reactive to light.  Neck: Normal range of motion. Neck supple.  Cardiovascular: Normal rate, regular rhythm, normal heart sounds and intact distal pulses.   No murmur heard. Pulmonary/Chest:  Effort normal and breath sounds normal. No respiratory distress. She has no wheezes. She exhibits no tenderness.  Abdominal: Soft. There is no tenderness. There is no rebound and no guarding.  Musculoskeletal: Normal range of motion. She exhibits no tenderness.  Lymphadenopathy:    She has no cervical adenopathy.  Neurological: She is alert. No cranial nerve deficit. Coordination normal.  Skin: Skin is warm and dry. She is not diaphoretic.  Psychiatric: She has a normal mood and affect. Her behavior is normal. Judgment and thought content normal.  Nursing note and vitals reviewed.   ED Course  Procedures (including critical care time) Labs Review Labs Reviewed  CBC  PROTIME-INR    Imaging Review No results found.   EKG Interpretation None      MDM   Final diagnoses:  Epistaxis  Warfarin anticoagulation  Pt is a 68 yo F with hx of HTN, PE (on coumadin long term), and remote benign cervical spine tumor s/p resection, who presents with spontaneous epistaxis.  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  Denies preceding sneezing, blowing her nose, or nose picking.  She tried pinching her nose and the blood slowed down but did not stop.  She is on anticoagulation, so presented to the ED out of concern her blood might be too thin.  Since the initial bleed, it has stopped/started several times, always stopping again after several minutes of direct pressure.  She endorses mild headache at her frontal area and feels like her "eyes are tired".  Similar to previous headaches.  No lightheadedness, palpitations, SOB, or generalized fatigue.  Denies blood in her stool or urine and is post-menopausal.    Patient is well appearing, in NAD.  Scant dried blood at her left nare and a scab is visible at the distal left nare.  No nasoseptal hematoma.  No blood noted in the oropharynx.     Given tylenol for mild headache.   Afrin spray provided to help vasoconstrict the area.    PT/INR and CBC were ordered in triage and they show Hb of 13.2 and INR of 2.1.    Monitored in the ED for some time and the patient remained asymptomatic.  Considered stable for dc home. Given instructions on treatment of epistaxis at home, advised to pinch her bridge, applying ice, and to lean her head forward to help decrease the risk of aspiration.  Encouraged to present to the ED if future bleeding does not stop after 30 minutes of holding pressure or with any other worrisome symptoms.  All questions were answered and patient was discharged in stable condition.    Patient was seen with ED Attending, Dr. Driscilla Moats, MD   Tori Milks, MD 10/31/14 Miner, MD 10/31/14 [phone]

## 2014-10-31 NOTE — ED Notes (Signed)
Pt on coumadin and had a nosebleed earlier that finally stopped. It bleed for about an hour and now her head hurts.

## 2014-10-31 NOTE — ED Notes (Signed)
Pt states it just started bleeding again.

## 2014-11-23 ENCOUNTER — Ambulatory Visit (INDEPENDENT_AMBULATORY_CARE_PROVIDER_SITE_OTHER): Payer: Medicare Other | Admitting: Family Medicine

## 2014-11-23 ENCOUNTER — Encounter: Payer: Self-pay | Admitting: Family Medicine

## 2014-11-23 ENCOUNTER — Other Ambulatory Visit: Payer: Self-pay | Admitting: Family Medicine

## 2014-11-23 VITALS — BP 108/62 | HR 60 | Resp 18 | Ht 66.0 in | Wt 205.0 lb

## 2014-11-23 DIAGNOSIS — R0782 Intercostal pain: Secondary | ICD-10-CM | POA: Diagnosis not present

## 2014-11-23 DIAGNOSIS — R079 Chest pain, unspecified: Secondary | ICD-10-CM | POA: Insufficient documentation

## 2014-11-23 DIAGNOSIS — K219 Gastro-esophageal reflux disease without esophagitis: Secondary | ICD-10-CM

## 2014-11-23 DIAGNOSIS — E119 Type 2 diabetes mellitus without complications: Secondary | ICD-10-CM

## 2014-11-23 DIAGNOSIS — I10 Essential (primary) hypertension: Secondary | ICD-10-CM

## 2014-11-23 DIAGNOSIS — E669 Obesity, unspecified: Secondary | ICD-10-CM

## 2014-11-23 DIAGNOSIS — E1169 Type 2 diabetes mellitus with other specified complication: Secondary | ICD-10-CM

## 2014-11-23 DIAGNOSIS — E785 Hyperlipidemia, unspecified: Secondary | ICD-10-CM | POA: Diagnosis not present

## 2014-11-23 DIAGNOSIS — R9431 Abnormal electrocardiogram [ECG] [EKG]: Secondary | ICD-10-CM

## 2014-11-23 DIAGNOSIS — M542 Cervicalgia: Secondary | ICD-10-CM

## 2014-11-23 NOTE — Assessment & Plan Note (Signed)
4 day h/o substernal discomfort/pressure rated at a 6, worse with activity  Risk factors for heart disease are DM and hyperlipidemia and hypertension, EKG shows non specific t wave abnormalities and LVH will refer to cardiology for further eval

## 2014-11-23 NOTE — Patient Instructions (Addendum)
Annual physical exam in 4 month, call if you need me before  EKG is abnormal as it has been in the past, you are referred to have cardiology evaluate your new chest pain, when you go for coumadin test tomorrow , you  May ask about the appt.  Fasting lipid, cmp and EGFr, hBA1C  Tomorrow please   Microalb and foot exam today  Need diagnostic mammogram in June I will enter IMPORTANT to f/u on this  Please work on good  health habits so that your health will improve. 1. Commitment to daily physical activity for 30 to 60  minutes, if you are able to do this.  2. Commitment to wise food choices. Aim for half of your  food intake to be vegetable and fruit, one quarter starchy foods, and one quarter protein. Try to eat on a regular schedule  3 meals per day, snacking between meals should be limited to vegetables or fruits or small portions of nuts. 64 ounces of water per day is generally recommended, unless you have specific health conditions, like heart failure or kidney failure where you will need to limit fluid intake.  3. Commitment to sufficient and a  good quality of physical and mental rest daily, generally between 6 to 8 hours per day.  WITH PERSISTANCE AND PERSEVERANCE, THE IMPOSSIBLE , BECOMES THE NORM!  Thanks for choosing [facility], we consider it a privelige to serve you.

## 2014-11-23 NOTE — Progress Notes (Signed)
Subjective:    Patient ID: Stephanie Singleton, female    DOB: 1945-10-24, 69 y.o.   MRN: [id number]  HPI   Stephanie Singleton     MRN: [id number]      DOB: 1946/05/18   HPI Stephanie Singleton is here with main c/o 4 day h/o chest pain, central, with or without activity,  Worse when lying down,  Non radiaiting and no associated nausea, sweating or light headedness . Follow up and re-evaluation of chronic medical conditions, medication management and review of any available recent lab and radiology data. Is also done at visit Preventive health is updated, specifically  Cancer screening and Immunization.   Questions or concerns regarding consultations or procedures which the PT has had in the interim are  addressed. The PT denies any adverse reactions to current medications since the last visit.    ROS Denies recent fever or chills. Denies sinus pressure, nasal congestion, ear pain or sore throat. Denies chest congestion, productive cough or wheezing. Denies PND or orthopnea and leg swelling Denies abdominal pain, nausea, vomiting,diarrhea or constipation.   Denies dysuria, frequency, hesitancy or incontinence. Chronic  joint pain,  and limitation in mobility.Increased low back pain in "tail bone" after sitting for long periods during travel Denies headaches, seizures, numbness, or tingling. Denies depression, anxiety or insomnia. Denies skin break down or rash.   PE  BP 108/62 mmHg  Pulse 60  Resp 18  Ht 5\' 6"  (1.676 m)  Wt 205 lb (92.987 kg)  BMI 33.10 kg/m2  SpO2 100%  Patient alert and oriented and in no cardiopulmonary distress.  HEENT: No facial asymmetry, EOMI,   oropharynx pink and moist.  Neck supple no JVD, no mass.  Chest: Clear to auscultation bilaterally.no reproducible chest wall paoin  CVS: S1, S2 no murmurs, no S3.Regular rate. OEU:MPNTIRWE t wave , NSR , No LVH ABD: Soft non tender.   Ext: No edema  MS: Adequate ROM spine, shoulders, hips and  knees.  Skin: Intact, no ulcerations or rash noted.  Psych: Good eye contact, normal affect. Memory intact not anxious or depressed appearing.  CNS: CN 2-12 intact, power,  normal throughout.no focal deficits noted.   Assessment & Plan   Chest pain 4 day h/o substernal discomfort/pressure rated at a 6, worse with activity  Risk factors for heart disease are DM and hyperlipidemia and hypertension, EKG shows non specific t wave abnormalities and LVH will refer to cardiology for further eval  Neck pain, bilateral Unchanged, chronic pain management as before  Diabetes mellitus, type II Updated lab needed at/ before next visit. Well controlled and improved Stephanie Singleton is reminded of the importance of commitment to daily physical activity for 30 minutes or more, as able and the need to limit carbohydrate intake to 30 to 60 grams per meal to help with blood sugar control.   The need to take medication as prescribed, test blood sugar as directed, and to call between visits if there is a concern that blood sugar is uncontrolled is also discussed.   Stephanie Singleton is reminded of the importance of daily foot exam, annual eye examination, and good blood sugar, blood pressure and cholesterol control.  Diabetic Labs Latest Ref Rng 11/24/2014 11/23/2014 04/13/2014 10/23/2013 07/16/2013  HbA1c <5.7 % - 5.8(H) 6.1(H) 6.5(H) -  Microalbumin <2.0 mg/dL 2.8(H) - - - 0.54  Micro/Creat Ratio 0.0 - 30.0 mg/g 23.2 - - - 2.9  Chol 0 - 200 mg/dL - 168 164 157 -  HDL >=46  mg/dL - 39(L) 40 35(L) -  Calc LDL 0 - 99 mg/dL - 90 89 93 -  Triglycerides <150 mg/dL - 197(H) 174(H) 145 -  Creatinine 0.50 - 1.10 mg/dL - 1.34(H) 0.81 0.86 -   BP/Weight 12/21/2014 11/23/2014 10/31/2014 09/28/2014 09/20/2014 11/05/1535 03/06/3275  Systolic BP 147 092 957 473 403 709 643  Diastolic BP 82 62 89 80 838 84 80  Wt. (Lbs) 213 205 - 206.4 207 204 207  BMI 34.4 33.1 - 33.33 33.43 32.94 33.43   Foot/eye exam completion dates  Latest Ref Rng 11/23/2014 11/24/2013  Eye Exam No Retinopathy - No Retinopathy  Foot Form Completion - Done -         Obesity (BMI 30.0-34.9) Deteriorated. Patient re-educated about  the importance of commitment to a  minimum of 150 minutes of exercise per week.  The importance of healthy food choices with portion control discussed. Encouraged to start a food diary, count calories and to consider  joining a support group. Sample diet sheets offered. Goals set by the patient for the next several months.   Weight /BMI 12/21/2014 11/23/2014 10/31/2014  WEIGHT 213 lb 205 lb -  HEIGHT 5\' 6"  5\' 6"  5\' 6"   BMI 34.4 kg/m2 33.1 kg/m2 -    Current exercise per week 60 minutes.   Hypertension goal BP (blood pressure) < 130/80 Controlled, no change in medication   GERD Likely uncontrolled and contributing to her chest pain, reminded of need to take PPI as prescribed , and daily      Review of Systems     Objective:   Physical Exam        Assessment & Plan:

## 2014-11-24 ENCOUNTER — Ambulatory Visit (INDEPENDENT_AMBULATORY_CARE_PROVIDER_SITE_OTHER): Payer: Medicare Other | Admitting: *Deleted

## 2014-11-24 ENCOUNTER — Ambulatory Visit: Payer: Medicare Other | Admitting: Family Medicine

## 2014-11-24 ENCOUNTER — Other Ambulatory Visit: Payer: Self-pay | Admitting: Family Medicine

## 2014-11-24 DIAGNOSIS — I2699 Other pulmonary embolism without acute cor pulmonale: Secondary | ICD-10-CM | POA: Diagnosis not present

## 2014-11-24 DIAGNOSIS — E669 Obesity, unspecified: Secondary | ICD-10-CM | POA: Diagnosis not present

## 2014-11-24 DIAGNOSIS — E119 Type 2 diabetes mellitus without complications: Secondary | ICD-10-CM | POA: Diagnosis not present

## 2014-11-24 DIAGNOSIS — Z5181 Encounter for therapeutic drug level monitoring: Secondary | ICD-10-CM

## 2014-11-24 LAB — COMPLETE METABOLIC PANEL WITH GFR
ALT: 13 U/L (ref 0–35)
AST: 18 U/L (ref 0–37)
Albumin: 4.1 g/dL (ref 3.5–5.2)
Alkaline Phosphatase: 100 U/L (ref 39–117)
BUN: 20 mg/dL (ref 6–23)
CO2: 27 mEq/L (ref 19–32)
Calcium: 9.2 mg/dL (ref 8.4–10.5)
Chloride: 104 mEq/L (ref 96–112)
Creat: 1.34 mg/dL — ABNORMAL HIGH (ref 0.50–1.10)
GFR, EST AFRICAN AMERICAN: 48 mL/min — AB
GFR, Est Non African American: 41 mL/min — ABNORMAL LOW
Glucose, Bld: 125 mg/dL — ABNORMAL HIGH (ref 70–99)
Potassium: 4.2 mEq/L (ref 3.5–5.3)
Sodium: 138 mEq/L (ref 135–145)
TOTAL PROTEIN: 7.8 g/dL (ref 6.0–8.3)
Total Bilirubin: 0.5 mg/dL (ref 0.2–1.2)

## 2014-11-24 LAB — LIPID PANEL
CHOL/HDL RATIO: 4.3 ratio
CHOLESTEROL: 168 mg/dL (ref 0–200)
HDL: 39 mg/dL — ABNORMAL LOW (ref >=46)
LDL Cholesterol: 90 mg/dL (ref 0–99)
Triglycerides: 197 mg/dL — ABNORMAL HIGH (ref <150)
VLDL: 39 mg/dL (ref 0–40)

## 2014-11-24 LAB — POCT INR: INR: 1.5

## 2014-11-24 LAB — HEMOGLOBIN A1C
HEMOGLOBIN A1C: 5.8 % — AB (ref <5.7)
Mean Plasma Glucose: 120 mg/dL — ABNORMAL HIGH (ref <117)

## 2014-11-25 ENCOUNTER — Other Ambulatory Visit: Payer: Self-pay | Admitting: Family Medicine

## 2014-11-25 LAB — MICROALBUMIN / CREATININE URINE RATIO
CREATININE, URINE: 120.6 mg/dL
Microalb Creat Ratio: 23.2 mg/g (ref 0.0–30.0)
Microalb, Ur: 2.8 mg/dL — ABNORMAL HIGH (ref <2.0)

## 2014-11-26 ENCOUNTER — Ambulatory Visit: Payer: Medicare Other | Admitting: Adult Health

## 2014-12-14 ENCOUNTER — Other Ambulatory Visit: Payer: Self-pay

## 2014-12-14 MED ORDER — HYDROCODONE-ACETAMINOPHEN 10-325 MG PO TABS: 1.0000 | ORAL_TABLET | Freq: Two times a day (BID) | ORAL | Status: DC

## 2014-12-21 ENCOUNTER — Ambulatory Visit (INDEPENDENT_AMBULATORY_CARE_PROVIDER_SITE_OTHER): Payer: Medicare Other | Admitting: Family Medicine

## 2014-12-21 ENCOUNTER — Encounter: Payer: Self-pay | Admitting: Family Medicine

## 2014-12-21 ENCOUNTER — Other Ambulatory Visit: Payer: Self-pay | Admitting: Family Medicine

## 2014-12-21 VITALS — BP 154/82 | HR 86 | Resp 18 | Ht 66.0 in | Wt 213.0 lb

## 2014-12-21 DIAGNOSIS — E669 Obesity, unspecified: Secondary | ICD-10-CM

## 2014-12-21 DIAGNOSIS — I10 Essential (primary) hypertension: Secondary | ICD-10-CM

## 2014-12-21 DIAGNOSIS — B369 Superficial mycosis, unspecified: Secondary | ICD-10-CM

## 2014-12-21 DIAGNOSIS — E118 Type 2 diabetes mellitus with unspecified complications: Secondary | ICD-10-CM

## 2014-12-21 DIAGNOSIS — E785 Hyperlipidemia, unspecified: Secondary | ICD-10-CM

## 2014-12-21 DIAGNOSIS — Z2821 Immunization not carried out because of patient refusal: Secondary | ICD-10-CM | POA: Diagnosis not present

## 2014-12-21 MED ORDER — NYSTATIN 100000 UNIT/GM EX POWD: CUTANEOUS | Status: DC

## 2014-12-21 MED ORDER — TORSEMIDE 20 MG PO TABS: ORAL_TABLET | ORAL | Status: DC

## 2014-12-21 MED ORDER — TERBINAFINE HCL 250 MG PO TABS: 250.0000 mg | ORAL_TABLET | Freq: Every day | ORAL | Status: DC

## 2014-12-21 MED ORDER — FLUCONAZOLE 100 MG PO TABS: 100.0000 mg | ORAL_TABLET | Freq: Every day | ORAL | Status: DC

## 2014-12-21 MED ORDER — PRAVASTATIN SODIUM 20 MG PO TABS: ORAL_TABLET | ORAL | Status: DC

## 2014-12-21 MED ORDER — HYDROXYZINE HCL 50 MG PO TABS: ORAL_TABLET | ORAL | Status: DC

## 2014-12-21 MED ORDER — PREDNISONE 5 MG PO TABS: 5.0000 mg | ORAL_TABLET | Freq: Two times a day (BID) | ORAL | Status: AC

## 2014-12-21 NOTE — Assessment & Plan Note (Addendum)
1 wek history. Lower abdomen, mid back, medication sent in to treat fungal and yeast infections, advised to keep area dry to reduce re infection

## 2014-12-21 NOTE — Patient Instructions (Addendum)
F/u in November as before  Blood pressure is high, work on weight loss and daily exercise and low salt  Nurse BP re check in 4 weeks, bring medication please   Call if you need me sooner  You are treated for skin infection, 4 tablets and a cream are sent in  Please work on good  health habits so that your health will improve. 1. Commitment to daily physical activity for 30 to 60  minutes, if you are able to do this.  2. Commitment to wise food choices. Aim for half of your  food intake to be vegetable and fruit, one quarter starchy foods, and one quarter protein. Try to eat on a regular schedule  3 meals per day, snacking between meals should be limited to vegetables or fruits or small portions of nuts. 64 ounces of water per day is generally recommended, unless you have specific health conditions, like heart failure or kidney failure where you will need to limit fluid intake.  3. Commitment to sufficient and a  good quality of physical and mental rest daily, generally between 6 to 8 hours per day.  WITH PERSISTANCE AND PERSEVERANCE, THE IMPOSSIBLE , BECOMES THE NORM!  Thanks for choosing [facility], we consider it a privelige to serve you.

## 2014-12-31 NOTE — Assessment & Plan Note (Signed)
Controlled, no change in medication  

## 2014-12-31 NOTE — Assessment & Plan Note (Addendum)
Updated lab needed at/ before next visit. Well controlled and improved Stephanie Singleton is reminded of the importance of commitment to daily physical activity for 30 minutes or more, as able and the need to limit carbohydrate intake to 30 to 60 grams per meal to help with blood sugar control.   The need to take medication as prescribed, test blood sugar as directed, and to call between visits if there is a concern that blood sugar is uncontrolled is also discussed.   Stephanie Singleton is reminded of the importance of daily foot exam, annual eye examination, and good blood sugar, blood pressure and cholesterol control.  Diabetic Labs Latest Ref Rng 11/24/2014 11/23/2014 04/13/2014 10/23/2013 07/16/2013  HbA1c <5.7 % - 5.8(H) 6.1(H) 6.5(H) -  Microalbumin <2.0 mg/dL 2.8(H) - - - 0.54  Micro/Creat Ratio 0.0 - 30.0 mg/g 23.2 - - - 2.9  Chol 0 - 200 mg/dL - 168 164 157 -  HDL >=46 mg/dL - 39(L) 40 35(L) -  Calc LDL 0 - 99 mg/dL - 90 89 93 -  Triglycerides <150 mg/dL - 197(H) 174(H) 145 -  Creatinine 0.50 - 1.10 mg/dL - 1.34(H) 0.81 0.86 -   BP/Weight 12/21/2014 11/23/2014 10/31/2014 09/28/2014 09/20/2014 09/07/8826 0/0/3491  Systolic BP 791 505 697 948 016 553 748  Diastolic BP 82 62 89 80 270 84 80  Wt. (Lbs) 213 205 - 206.4 207 204 207  BMI 34.4 33.1 - 33.33 33.43 32.94 33.43   Foot/eye exam completion dates Latest Ref Rng 11/23/2014 11/24/2013  Eye Exam No Retinopathy - No Retinopathy  Foot Form Completion - Done -

## 2014-12-31 NOTE — Assessment & Plan Note (Signed)
Unchanged, chronic pain management as before  

## 2014-12-31 NOTE — Assessment & Plan Note (Signed)
Deteriorated. Patient re-educated about  the importance of commitment to a  minimum of 150 minutes of exercise per week.  The importance of healthy food choices with portion control discussed. Encouraged to start a food diary, count calories and to consider  joining a support group. Sample diet sheets offered. Goals set by the patient for the next several months.   Weight /BMI 12/21/2014 11/23/2014 10/31/2014  WEIGHT 213 lb 205 lb -  HEIGHT 5\' 6"  5\' 6"  5\' 6"   BMI 34.4 kg/m2 33.1 kg/m2 -    Current exercise per week 60 minutes.

## 2014-12-31 NOTE — Assessment & Plan Note (Signed)
Likely uncontrolled and contributing to her chest pain, reminded of need to take PPI as prescribed , and daily

## 2015-01-03 DIAGNOSIS — E785 Hyperlipidemia, unspecified: Secondary | ICD-10-CM | POA: Insufficient documentation

## 2015-01-03 NOTE — Assessment & Plan Note (Signed)
maintained on lifelong anti coagullation,

## 2015-01-03 NOTE — Assessment & Plan Note (Signed)
Uncontrolled. DASH diet and commitment to daily physical activity for a minimum of 30 minutes discussed and encouraged, as a part of hypertension management. The importance of attaining a healthy weight is also discussed.  BP/Weight 12/21/2014 11/23/2014 10/31/2014 09/28/2014 09/20/2014 0/0/7622 12/02/3352  Systolic BP 562 563 893 734 287 681 157  Diastolic BP 82 62 89 80 262 84 80  Wt. (Lbs) 213 205 - 206.4 207 204 207  BMI 34.4 33.1 - 33.33 33.43 32.94 33.43      Recheck BP by nursing in 4 weeks

## 2015-01-03 NOTE — Assessment & Plan Note (Signed)
Continues to refuse necessary immunization despite re education

## 2015-01-03 NOTE — Assessment & Plan Note (Signed)
Deteriorated. Patient re-educated about  the importance of commitment to a  minimum of 150 minutes of exercise per week.  The importance of healthy food choices with portion control discussed. Encouraged to start a food diary, count calories and to consider  joining a support group. Sample diet sheets offered. Goals set by the patient for the next several months.   Weight /BMI 12/21/2014 11/23/2014 10/31/2014  WEIGHT 213 lb 205 lb -  HEIGHT 5\' 6"  5\' 6"  5\' 6"   BMI 34.4 kg/m2 33.1 kg/m2 -    Current exercise per week 60 minutes.

## 2015-01-03 NOTE — Progress Notes (Signed)
Stephanie Singleton     MRN: [id number]      DOB: [birth year]   HPI Stephanie Singleton is here for follow up and re-evaluation of chronic medical conditions, medication management and review of any available recent lab and radiology data.  Preventive health is updated, specifically  Cancer screening and Immunization.   Questions or concerns regarding consultations or procedures which the PT has had in the interim are  addressed. The PT denies any adverse reactions to current medications since the last visit.  1 week h/o pruritic rash in  Genital area , spreading and extremely irritating, disturbs sleep, no fever, chills or purulent drainage  ROS Denies recent fever or chills. Denies sinus pressure, nasal congestion, ear pain or sore throat. Denies chest congestion, productive cough or wheezing. Denies chest pains, palpitations and leg swelling Denies abdominal pain, nausea, vomiting,diarrhea or constipation.   Denies dysuria, frequency, hesitancy or incontinence. Denies uncontrolled  joint pain, swelling and limitation in mobility. Denies headaches, seizures, numbness, or tingling. Denies depression, anxiety or insomnia. Marland Kitchen   PE  BP 154/82 mmHg  Pulse 86  Resp 18  Ht 5\' 6"  (1.676 m)  Wt 213 lb (96.616 kg)  BMI 34.40 kg/m2  SpO2 100%  Patient alert and oriented and in no cardiopulmonary distress.  HEENT: No facial asymmetry, EOMI,   oropharynx pink and moist.  Neck supple no JVD, no mass.  Chest: Clear to auscultation bilaterally.  CVS: S1, S2 no murmurs, no S3.Regular rate.  ABD: Soft non tender.   Ext: No edema  MS: Adequate ROM spine, shoulders, hips and knees.  Skin: extensive fungal and yeast rash present in inguinal regions, lower abdominal folds and perineum, no bacterial superinfection noted, no purulent drainage  Psych: Good eye contact, normal affect. Memory intact not anxious or depressed appearing.  CNS: CN 2-12 intact, power,  normal throughout.no focal  deficits noted.   Assessment & Plan  Dermatomycosis 1 wek history. Lower abdomen, mid back, medication sent in to treat fungal and yeast infections, advised to keep area dry to reduce re infection  Hypertension goal BP (blood pressure) < 130/80 Uncontrolled. DASH diet and commitment to daily physical activity for a minimum of 30 minutes discussed and encouraged, as a part of hypertension management. The importance of attaining a healthy weight is also discussed.  BP/Weight 12/21/2014 11/23/2014 10/31/2014 09/28/2014 09/20/2014 12/03/1495 0/08/6376  Systolic BP 588 502 774 128 786 767 209  Diastolic BP 82 62 89 80 470 84 80  Wt. (Lbs) 213 205 - 206.4 207 204 207  BMI 34.4 33.1 - 33.33 33.43 32.94 33.43      Recheck BP by nursing in 4 weeks  Obesity (BMI 30.0-34.9) Deteriorated. Patient re-educated about  the importance of commitment to a  minimum of 150 minutes of exercise per week.  The importance of healthy food choices with portion control discussed. Encouraged to start a food diary, count calories and to consider  joining a support group. Sample diet sheets offered. Goals set by the patient for the next several months.   Weight /BMI 12/21/2014 11/23/2014 10/31/2014  WEIGHT 213 lb 205 lb -  HEIGHT 5\' 6"  5\' 6"  5\' 6"   BMI 34.4 kg/m2 33.1 kg/m2 -    Current exercise per week 60 minutes.   Vaccination not carried out because of patient refusal Continues to refuse necessary immunization despite re education  Diabetes mellitus, type II Stephanie Singleton is reminded of the importance of commitment to daily physical activity for 30 minutes  or more, as able and the need to limit carbohydrate intake to 30 to 60 grams per meal to help with blood sugar control.   The need to take medication as prescribed, test blood sugar as directed, and to call between visits if there is a concern that blood sugar is uncontrolled is also discussed.   Stephanie Singleton is reminded of the importance of daily foot  exam, annual eye examination, and good blood sugar, blood pressure and cholesterol control. Improved Diabetic Labs Latest Ref Rng 11/24/2014 11/23/2014 04/13/2014 10/23/2013 07/16/2013  HbA1c <5.7 % - 5.8(H) 6.1(H) 6.5(H) -  Microalbumin <2.0 mg/dL 2.8(H) - - - 0.54  Micro/Creat Ratio 0.0 - 30.0 mg/g 23.2 - - - 2.9  Chol 0 - 200 mg/dL - 168 164 157 -  HDL >=46 mg/dL - 39(L) 40 35(L) -  Calc LDL 0 - 99 mg/dL - 90 89 93 -  Triglycerides <150 mg/dL - 197(H) 174(H) 145 -  Creatinine 0.50 - 1.10 mg/dL - 1.34(H) 0.81 0.86 -   BP/Weight 12/21/2014 11/23/2014 10/31/2014 09/28/2014 09/20/2014 12/03/8935 09/03/2874  Systolic BP 811 572 620 355 974 163 845  Diastolic BP 82 62 89 80 364 84 80  Wt. 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   Lipid Panel  Lab Results  Component Value Date   CHOL 168 11/23/2014   HDL 39* 11/23/2014   LDLCALC 90 11/23/2014   TRIG 197* 11/23/2014   CHOLHDL 4.3 11/23/2014   Uncontrolled Updated lab needed at/ before next visit.

## 2015-01-03 NOTE — Assessment & Plan Note (Signed)
Hyperlipidemia:Low fat diet discussed and encouraged.   Lipid Panel  Lab Results  Component Value Date   CHOL 168 11/23/2014   HDL 39* 11/23/2014   LDLCALC 90 11/23/2014   TRIG 197* 11/23/2014   CHOLHDL 4.3 11/23/2014   Uncontrolled Updated lab needed at/ before next visit.

## 2015-01-03 NOTE — Assessment & Plan Note (Signed)
Stephanie Singleton is reminded of the importance of commitment to daily physical activity for 30 minutes or more, as able and the need to limit carbohydrate intake to 30 to 60 grams per meal to help with blood sugar control.   The need to take medication as prescribed, test blood sugar as directed, and to call between visits if there is a concern that blood sugar is uncontrolled is also discussed.   Stephanie Singleton is reminded of the importance of daily foot exam, annual eye examination, and good blood sugar, blood pressure and cholesterol control. Improved Diabetic Labs Latest Ref Rng 11/24/2014 11/23/2014 04/13/2014 10/23/2013 07/16/2013  HbA1c <5.7 % - 5.8(H) 6.1(H) 6.5(H) -  Microalbumin <2.0 mg/dL 2.8(H) - - - 0.54  Micro/Creat Ratio 0.0 - 30.0 mg/g 23.2 - - - 2.9  Chol 0 - 200 mg/dL - 168 164 157 -  HDL >=46 mg/dL - 39(L) 40 35(L) -  Calc LDL 0 - 99 mg/dL - 90 89 93 -  Triglycerides <150 mg/dL - 197(H) 174(H) 145 -  Creatinine 0.50 - 1.10 mg/dL - 1.34(H) 0.81 0.86 -   BP/Weight 12/21/2014 11/23/2014 10/31/2014 09/28/2014 09/20/2014 08/03/252 08/08/621  Systolic BP 762 831 517 616 073 710 626  Diastolic BP 82 62 89 80 948 84 80  Wt. (Lbs) 213 205 - 206.4 207 204 207  BMI 34.4 33.1 - 33.33 33.43 32.94 33.43   Foot/eye exam completion dates Latest Ref Rng 11/23/2014 11/24/2013  Eye Exam No Retinopathy - No Retinopathy  Foot Form Completion - Done -

## 2015-01-04 ENCOUNTER — Other Ambulatory Visit: Payer: Self-pay | Admitting: Family Medicine

## 2015-01-07 ENCOUNTER — Other Ambulatory Visit: Payer: Self-pay

## 2015-01-07 MED ORDER — HYDROCODONE-ACETAMINOPHEN 10-325 MG PO TABS: 1.0000 | ORAL_TABLET | Freq: Two times a day (BID) | ORAL | Status: DC

## 2015-01-11 ENCOUNTER — Ambulatory Visit (INDEPENDENT_AMBULATORY_CARE_PROVIDER_SITE_OTHER): Payer: Medicare Other | Admitting: *Deleted

## 2015-01-11 DIAGNOSIS — Z5181 Encounter for therapeutic drug level monitoring: Secondary | ICD-10-CM

## 2015-01-11 DIAGNOSIS — I2699 Other pulmonary embolism without acute cor pulmonale: Secondary | ICD-10-CM | POA: Diagnosis not present

## 2015-01-11 LAB — POCT INR: INR: 2.1

## 2015-01-18 ENCOUNTER — Other Ambulatory Visit: Payer: Self-pay | Admitting: Family Medicine

## 2015-01-20 ENCOUNTER — Ambulatory Visit: Payer: Medicare Other

## 2015-01-20 VITALS — BP 140/76

## 2015-01-20 DIAGNOSIS — I10 Essential (primary) hypertension: Secondary | ICD-10-CM

## 2015-01-21 NOTE — Progress Notes (Signed)
Patient in for nurse visit for blood pressure check for [facility] medical review.  Blood pressure within normal range and changed on form.   Patient has had eye exam within the calender year and does not require corrective lenses.   Form given to patient and faxed back.

## 2015-02-09 ENCOUNTER — Encounter: Payer: Self-pay | Admitting: *Deleted

## 2015-02-18 ENCOUNTER — Other Ambulatory Visit: Payer: Self-pay

## 2015-02-18 MED ORDER — PRAVASTATIN SODIUM 20 MG PO TABS: 20.0000 mg | ORAL_TABLET | Freq: Every day | ORAL | Status: DC

## 2015-02-18 MED ORDER — HYDROCODONE-ACETAMINOPHEN 10-325 MG PO TABS: 1.0000 | ORAL_TABLET | Freq: Two times a day (BID) | ORAL | Status: DC

## 2015-02-18 MED ORDER — LOSARTAN POTASSIUM 100 MG PO TABS: ORAL_TABLET | ORAL | Status: DC

## 2015-02-23 ENCOUNTER — Ambulatory Visit (INDEPENDENT_AMBULATORY_CARE_PROVIDER_SITE_OTHER): Payer: Medicare Other | Admitting: *Deleted

## 2015-02-23 ENCOUNTER — Other Ambulatory Visit: Payer: Self-pay

## 2015-02-23 DIAGNOSIS — I2699 Other pulmonary embolism without acute cor pulmonale: Secondary | ICD-10-CM | POA: Diagnosis not present

## 2015-02-23 DIAGNOSIS — Z5181 Encounter for therapeutic drug level monitoring: Secondary | ICD-10-CM

## 2015-02-23 LAB — POCT INR: INR: 1.3

## 2015-02-23 MED ORDER — TORSEMIDE 20 MG PO TABS: ORAL_TABLET | ORAL | Status: DC

## 2015-02-23 MED ORDER — PANTOPRAZOLE SODIUM 40 MG PO TBEC: 40.0000 mg | DELAYED_RELEASE_TABLET | Freq: Every day | ORAL | Status: DC

## 2015-02-25 ENCOUNTER — Other Ambulatory Visit: Payer: Self-pay | Admitting: Adult Health

## 2015-02-28 DIAGNOSIS — D3611 Benign neoplasm of peripheral nerves and autonomic nervous system of face, head, and neck: Secondary | ICD-10-CM | POA: Diagnosis not present

## 2015-02-28 DIAGNOSIS — I10 Essential (primary) hypertension: Secondary | ICD-10-CM | POA: Diagnosis not present

## 2015-03-08 ENCOUNTER — Other Ambulatory Visit: Payer: Self-pay | Admitting: Neurosurgery

## 2015-03-08 DIAGNOSIS — D3611 Benign neoplasm of peripheral nerves and autonomic nervous system of face, head, and neck: Secondary | ICD-10-CM

## 2015-03-14 ENCOUNTER — Ambulatory Visit: Payer: Medicare Other

## 2015-03-14 ENCOUNTER — Ambulatory Visit (INDEPENDENT_AMBULATORY_CARE_PROVIDER_SITE_OTHER): Payer: Medicare Other | Admitting: *Deleted

## 2015-03-14 VITALS — BP 170/100 | Wt 208.0 lb

## 2015-03-14 DIAGNOSIS — N3001 Acute cystitis with hematuria: Secondary | ICD-10-CM

## 2015-03-14 DIAGNOSIS — Z5181 Encounter for therapeutic drug level monitoring: Secondary | ICD-10-CM | POA: Diagnosis not present

## 2015-03-14 DIAGNOSIS — I2699 Other pulmonary embolism without acute cor pulmonale: Secondary | ICD-10-CM | POA: Diagnosis not present

## 2015-03-14 DIAGNOSIS — R300 Dysuria: Secondary | ICD-10-CM

## 2015-03-14 LAB — POCT INR: INR: 2.4

## 2015-03-14 MED ORDER — TRIAMTERENE-HCTZ 37.5-25 MG PO TABS: 1.0000 | ORAL_TABLET | Freq: Every day | ORAL | Status: DC

## 2015-03-14 MED ORDER — CIPROFLOXACIN HCL 500 MG PO TABS: 500.0000 mg | ORAL_TABLET | Freq: Two times a day (BID) | ORAL | Status: DC

## 2015-03-14 NOTE — Patient Instructions (Signed)
Schedule an OV for 6 weeks for BP follow up   New for BP is Maxzide 37.7/25 1 daily - continue the losartan  Cipro 500 for 3 days will be sent for UTI. Will contact you in 3-4 days with the urine culture results.  Call back with any concerns

## 2015-03-14 NOTE — Progress Notes (Signed)
U/A to be done. Leukocytes and trace of blood, will send for culture. Since symptomatic will send cipro 500 x 3 days to her pharmacy.   Her BP is high, will add maxzide 25 qd and schedule a 4 week follow up visit

## 2015-03-17 LAB — URINE CULTURE: Colony Count: >=100000

## 2015-03-24 ENCOUNTER — Other Ambulatory Visit: Payer: Self-pay

## 2015-03-24 MED ORDER — HYDROCODONE-ACETAMINOPHEN 10-325 MG PO TABS: 1.0000 | ORAL_TABLET | Freq: Two times a day (BID) | ORAL | Status: DC

## 2015-03-28 ENCOUNTER — Telehealth: Payer: Self-pay

## 2015-03-28 ENCOUNTER — Ambulatory Visit: Payer: Medicare Other

## 2015-03-28 VITALS — BP 138/82

## 2015-03-28 DIAGNOSIS — I10 Essential (primary) hypertension: Secondary | ICD-10-CM

## 2015-03-28 NOTE — Telephone Encounter (Signed)
Wants to know if there is something else besides the hydrocodone that she can take her pain. Said the hydrocodone is too strong and she just feels like sleeping all the time. Tramadol doesn't work for her and she can't take Ibuprofen. Told her I would check and see and call her back

## 2015-03-28 NOTE — Progress Notes (Signed)
Advised to continue the meds as directed and to call back with any concerns

## 2015-03-28 NOTE — Telephone Encounter (Signed)
Tylenol 500 mg one 3 times daily  May aklso use lower dose hydrocodone 5/325 one twice daily, would be less sedating if the tylenol will not work

## 2015-03-29 ENCOUNTER — Other Ambulatory Visit: Payer: Self-pay | Admitting: Family Medicine

## 2015-03-29 NOTE — Telephone Encounter (Signed)
Entered historically pls print etc

## 2015-03-29 NOTE — Telephone Encounter (Signed)
Wants to decrease to the hydrocodone 5/325 since the 10mg  is too sedating. Will call her back when ready for pickup

## 2015-03-29 NOTE — Telephone Encounter (Signed)
Called patient and left message for them to return call at the office   

## 2015-03-30 ENCOUNTER — Other Ambulatory Visit: Payer: Self-pay

## 2015-03-30 MED ORDER — HYDROCODONE-ACETAMINOPHEN 5-325 MG PO TABS: ORAL_TABLET | ORAL | Status: DC

## 2015-03-30 NOTE — Telephone Encounter (Signed)
Med ready and patient aware

## 2015-04-08 ENCOUNTER — Ambulatory Visit
Admission: RE | Admit: 2015-04-08 | Discharge: 2015-04-08 | Disposition: A | Discharge status: Home / Self Care | Payer: Medicare Other | Source: Ambulatory Visit | Attending: Neurosurgery | Admitting: Neurosurgery

## 2015-04-08 DIAGNOSIS — D3611 Benign neoplasm of peripheral nerves and autonomic nervous system of face, head, and neck: Secondary | ICD-10-CM

## 2015-04-08 DIAGNOSIS — M4802 Spinal stenosis, cervical region: Secondary | ICD-10-CM | POA: Diagnosis not present

## 2015-04-08 DIAGNOSIS — M50222 Other cervical disc displacement at C5-C6 level: Secondary | ICD-10-CM | POA: Diagnosis not present

## 2015-04-08 DIAGNOSIS — M4322 Fusion of spine, cervical region: Secondary | ICD-10-CM | POA: Diagnosis not present

## 2015-04-08 DIAGNOSIS — M539 Dorsopathy, unspecified: Secondary | ICD-10-CM | POA: Diagnosis not present

## 2015-04-08 MED ORDER — GADOBENATE DIMEGLUMINE 529 MG/ML IV SOLN
20.0000 mL | Freq: Once | INTRAVENOUS | Status: AC | PRN
  Administered 2015-04-08: 20 mL via INTRAVENOUS

## 2015-04-13 ENCOUNTER — Ambulatory Visit (INDEPENDENT_AMBULATORY_CARE_PROVIDER_SITE_OTHER): Payer: Medicare Other | Admitting: *Deleted

## 2015-04-13 DIAGNOSIS — I2699 Other pulmonary embolism without acute cor pulmonale: Secondary | ICD-10-CM

## 2015-04-13 DIAGNOSIS — Z5181 Encounter for therapeutic drug level monitoring: Secondary | ICD-10-CM

## 2015-04-13 LAB — POCT INR: INR: 2.2

## 2015-04-21 DIAGNOSIS — I10 Essential (primary) hypertension: Secondary | ICD-10-CM | POA: Diagnosis not present

## 2015-04-21 DIAGNOSIS — Z6834 Body mass index (BMI) 34.0-34.9, adult: Secondary | ICD-10-CM | POA: Diagnosis not present

## 2015-04-21 DIAGNOSIS — D3611 Benign neoplasm of peripheral nerves and autonomic nervous system of face, head, and neck: Secondary | ICD-10-CM | POA: Diagnosis not present

## 2015-04-28 ENCOUNTER — Telehealth: Payer: Self-pay

## 2015-04-28 MED ORDER — HYDROXYZINE HCL 50 MG PO TABS: 50.0000 mg | ORAL_TABLET | Freq: Every day | ORAL | Status: DC

## 2015-04-28 NOTE — Telephone Encounter (Signed)
pls send hydroxyzine 50 mg one at bedtime x 2 month, remind her to keep Nov appt also pls

## 2015-04-28 NOTE — Telephone Encounter (Signed)
Patient aware.

## 2015-04-28 NOTE — Addendum Note (Signed)
Addended by: Denman George B on: 04/28/2015 03:24 PM   Modules accepted: Orders

## 2015-05-06 ENCOUNTER — Other Ambulatory Visit: Payer: Self-pay

## 2015-05-06 MED ORDER — HYDROCODONE-ACETAMINOPHEN 5-325 MG PO TABS: ORAL_TABLET | ORAL | Status: DC

## 2015-05-31 ENCOUNTER — Ambulatory Visit (INDEPENDENT_AMBULATORY_CARE_PROVIDER_SITE_OTHER): Payer: Medicare Other | Admitting: Family Medicine

## 2015-05-31 ENCOUNTER — Encounter: Payer: Self-pay | Admitting: Family Medicine

## 2015-05-31 VITALS — BP 118/78 | HR 72 | Resp 18 | Ht 66.0 in | Wt 216.1 lb

## 2015-05-31 DIAGNOSIS — N6310 Unspecified lump in the right breast, unspecified quadrant: Secondary | ICD-10-CM

## 2015-05-31 DIAGNOSIS — E118 Type 2 diabetes mellitus with unspecified complications: Secondary | ICD-10-CM

## 2015-05-31 DIAGNOSIS — E559 Vitamin D deficiency, unspecified: Secondary | ICD-10-CM

## 2015-05-31 DIAGNOSIS — E785 Hyperlipidemia, unspecified: Secondary | ICD-10-CM

## 2015-05-31 DIAGNOSIS — Z1231 Encounter for screening mammogram for malignant neoplasm of breast: Secondary | ICD-10-CM

## 2015-05-31 DIAGNOSIS — K219 Gastro-esophageal reflux disease without esophagitis: Secondary | ICD-10-CM

## 2015-05-31 DIAGNOSIS — Z2821 Immunization not carried out because of patient refusal: Secondary | ICD-10-CM

## 2015-05-31 DIAGNOSIS — E119 Type 2 diabetes mellitus without complications: Secondary | ICD-10-CM

## 2015-05-31 DIAGNOSIS — I10 Essential (primary) hypertension: Secondary | ICD-10-CM | POA: Diagnosis not present

## 2015-05-31 DIAGNOSIS — E669 Obesity, unspecified: Secondary | ICD-10-CM

## 2015-05-31 DIAGNOSIS — Z1211 Encounter for screening for malignant neoplasm of colon: Secondary | ICD-10-CM

## 2015-05-31 DIAGNOSIS — E1169 Type 2 diabetes mellitus with other specified complication: Secondary | ICD-10-CM

## 2015-05-31 DIAGNOSIS — N63 Unspecified lump in breast: Secondary | ICD-10-CM

## 2015-05-31 DIAGNOSIS — Z0000 Encounter for general adult medical examination without abnormal findings: Secondary | ICD-10-CM | POA: Insufficient documentation

## 2015-05-31 LAB — HEMOCCULT GUIAC POC 1CARD (OFFICE): Fecal Occult Blood, POC: NEGATIVE

## 2015-05-31 MED ORDER — VITAMIN D3 1.25 MG (50000 UT) PO CAPS: 50000.0000 [IU] | ORAL_CAPSULE | ORAL | Status: DC

## 2015-05-31 NOTE — Patient Instructions (Addendum)
F/u in 4 months, call if you need me before  Congrats and all the best.  I expect that you will find a new PCP nearer to your new home, IF YOU HAVE NOT established in 4 months, need follow up in this office  Labs today  Vision is very poor in one eye, NEED to keep appt with Dr Iona Hansen, we are referring  NEEDED mammogram due to abnormal left breasst is ALREADY pAST due , VITAL that you keep the appointment that we make   Thanks for choosing [facility], we consider it a privelige to serve you. All the best for 2017!

## 2015-06-01 LAB — TSH: TSH: 0.446 u[IU]/mL (ref 0.350–4.500)

## 2015-06-01 LAB — LIPID PANEL
Cholesterol: 168 mg/dL (ref 125–200)
HDL: 47 mg/dL (ref >=46)
LDL CALC: 90 mg/dL (ref <130)
Total CHOL/HDL Ratio: 3.6 Ratio (ref <=5.0)
Triglycerides: 156 mg/dL — ABNORMAL HIGH (ref <150)
VLDL: 31 mg/dL — ABNORMAL HIGH (ref <30)

## 2015-06-01 LAB — COMPLETE METABOLIC PANEL WITH GFR
ALBUMIN: 3.7 g/dL (ref 3.6–5.1)
ALK PHOS: 80 U/L (ref 33–130)
ALT: 11 U/L (ref 6–29)
AST: 16 U/L (ref 10–35)
BILIRUBIN TOTAL: 0.5 mg/dL (ref 0.2–1.2)
BUN: 22 mg/dL (ref 7–25)
CO2: 25 mmol/L (ref 20–31)
Calcium: 9.4 mg/dL (ref 8.6–10.4)
Chloride: 103 mmol/L (ref 98–110)
Creat: 1.14 mg/dL — ABNORMAL HIGH (ref 0.50–0.99)
GFR, EST NON AFRICAN AMERICAN: 50 mL/min — AB (ref >=60)
GFR, Est African American: 57 mL/min — ABNORMAL LOW (ref >=60)
GLUCOSE: 127 mg/dL — AB (ref 65–99)
POTASSIUM: 4.2 mmol/L (ref 3.5–5.3)
SODIUM: 141 mmol/L (ref 135–146)
TOTAL PROTEIN: 7.3 g/dL (ref 6.1–8.1)

## 2015-06-01 LAB — HEMOGLOBIN A1C
Hgb A1c MFr Bld: 5.9 % — ABNORMAL HIGH (ref <5.7)
Mean Plasma Glucose: 123 mg/dL — ABNORMAL HIGH (ref <117)

## 2015-06-01 LAB — VITAMIN D 25 HYDROXY (VIT D DEFICIENCY, FRACTURES): VIT D 25 HYDROXY: 24 ng/mL — AB (ref 30–100)

## 2015-06-05 DIAGNOSIS — K219 Gastro-esophageal reflux disease without esophagitis: Secondary | ICD-10-CM | POA: Insufficient documentation

## 2015-06-05 DIAGNOSIS — Z1211 Encounter for screening for malignant neoplasm of colon: Secondary | ICD-10-CM | POA: Insufficient documentation

## 2015-06-05 NOTE — Assessment & Plan Note (Signed)
Hyperlipidemia:Low fat diet discussed and encouraged.   Lipid Panel  Lab Results  Component Value Date   CHOL 168 05/31/2015   HDL 47 05/31/2015   LDLCALC 90 05/31/2015   TRIG 156* 05/31/2015   CHOLHDL 3.6 05/31/2015   Needs to reduce fat intake , TG elevated , otherwise excellent, no med change

## 2015-06-05 NOTE — Assessment & Plan Note (Signed)
Heme negative stool 

## 2015-06-05 NOTE — Assessment & Plan Note (Signed)
Stephanie Singleton is reminded of the importance of commitment to daily physical activity for 30 minutes or more, as able and the need to limit carbohydrate intake to 30 to 60 grams per meal to help with blood sugar control.   Improved, and on no medication, managed by diet  Stephanie Singleton is reminded of the importance of daily foot exam, annual eye examination, and good blood sugar, blood pressure and cholesterol control.  Diabetic Labs Latest Ref Rng 05/31/2015 11/24/2014 11/23/2014 04/13/2014 10/23/2013  HbA1c <5.7 % 5.9(H) - 5.8(H) 6.1(H) 6.5(H)  Microalbumin <2.0 mg/dL - 2.8(H) - - -  Micro/Creat Ratio 0.0 - 30.0 mg/g - 23.2 - - -  Chol 125 - 200 mg/dL 168 - 168 164 157  HDL >=46 mg/dL 47 - 39(L) 40 35(L)  Calc LDL <130 mg/dL 90 - 90 89 93  Triglycerides <150 mg/dL 156(H) - 197(H) 174(H) 145  Creatinine 0.50 - 0.99 mg/dL 1.14(H) - 1.34(H) 0.81 0.86   BP/Weight 05/31/2015 03/28/2015 03/14/2015 01/20/2015 12/21/2014 [id number]  Systolic BP 123456 0000000 [id number] [id number] 123456 123XX123 [id number]  Diastolic BP 78 82 123XX123 76 82 62 89  Wt. (Lbs) 216.12 - 208 - 213 205 -  BMI 34.9 - 33.59 - 34.4 33.1 -   Foot/eye exam completion dates Latest Ref Rng 11/23/2014 11/24/2013  Eye Exam No Retinopathy - No Retinopathy  Foot Form Completion - Done -

## 2015-06-05 NOTE — Assessment & Plan Note (Signed)
On lifelong coumadin, has had no adverse s/e from this

## 2015-06-05 NOTE — Assessment & Plan Note (Signed)
Refuse offered flu vaccine

## 2015-06-05 NOTE — Progress Notes (Signed)
Subjective:    Patient ID: Stephanie Singleton, female    DOB: 10/31/1945, 69 y.o.   MRN: [id number]  HPI Pt initially scheduled for annual physical exam, however, she has her pelvic exam with gyne, and she is already past due on rept mammogram for possible abnormality. Was due 5 months age, states she has no abnormality on self  exam , but is willing to follow through with mammogram once scheduled. Has relocated and will be seeking a new PCP nearer to her , which is appropriate/ desired. Labs today, refuses flu vaccine Denies polyuria, polydipsia, blurred vision , or hypoglycemic episodes. C/o chronic neck pain, no further surgical intervention planned Review of Systems See HPI     Objective:   Physical Exam BP 118/78 mmHg  Pulse 72  Resp 18  Ht 5\' 6"  (1.676 m)  Wt 216 lb 1.9 oz (98.031 kg)  BMI 34.90 kg/m2  SpO2 99%   Patient alert and oriented and in no cardiopulmonary distress.  HEENT: No facial asymmetry, EOMI,   oropharynx pink and moist.  Neck decreased, though adequate ROM no JVD, no mass.  Chest: Clear to auscultation bilaterally. Breasts: not examined as pt remained fully clothed, and has upcoming mammogram CVS: S1, S2 no murmurs, no S3.Regular rate.  ABD: Soft non tender. No organomegaly or mass. Normal BS Rectsl: no mass, heme negative stool  Ext: No edema  MS: Adequate ROM spine, shoulders, hips and knees.  Skin: Intact, no ulcerations or rash noted.  Psych: Good eye contact, normal affect. Memory intact not anxious or depressed appearing.  CNS: CN 2-12 intact, power,  normal throughout.no focal deficits noted.        Assessment & Plan:  Hypertension goal BP (blood pressure) < 130/80 Controlled, no change in medication DASH diet and commitment to daily physical activity for a minimum of 30 minutes discussed and encouraged, as a part of hypertension management. The importance of attaining a healthy weight is also discussed.  BP/Weight 05/31/2015  03/28/2015 03/14/2015 01/20/2015 12/21/2014 [id number]  Systolic BP 123456 0000000 [id number] [id number] 123456 123XX123 [id number]  Diastolic BP 78 82 123XX123 76 82 62 89  Wt. 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   Improved, and on no medication, managed by diet  Stephanie Singleton is reminded of the importance of daily foot exam, annual eye examination, and good blood sugar, blood pressure and cholesterol control.  Diabetic Labs Latest Ref Rng 05/31/2015 11/24/2014 11/23/2014 04/13/2014 10/23/2013  HbA1c <5.7 % 5.9(H) - 5.8(H) 6.1(H) 6.5(H)  Microalbumin <2.0 mg/dL - 2.8(H) - - -  Micro/Creat Ratio 0.0 - 30.0 mg/g - 23.2 - - -  Chol 125 - 200 mg/dL 168 - 168 164 157  HDL >=46 mg/dL 47 - 39(L) 40 35(L)  Calc LDL <130 mg/dL 90 - 90 89 93  Triglycerides <150 mg/dL 156(H) - 197(H) 174(H) 145  Creatinine 0.50 - 0.99 mg/dL 1.14(H) - 1.34(H) 0.81 0.86   BP/Weight 05/31/2015 03/28/2015 03/14/2015 01/20/2015 12/21/2014 [id number]  Systolic BP 123456 0000000 [id number] 154 108 172  Diastolic BP 78 82 123XX123 76 82 62 89  Wt. (Lbs) 216.12 - 208 - 213 205 -  BMI 34.9 - 33.59 - 34.4 33.1 -   Foot/eye exam completion dates Latest Ref Rng 11/23/2014 11/24/2013  Eye Exam No Retinopathy - No Retinopathy  Foot Form Completion - Done -         Vaccination not carried out because of patient refusal Refuse offered flu vaccine  Vitamin D deficiency Updated lab needed , checked on day of visit  Hyperlipidemia LDL goal <100 Hyperlipidemia:Low fat diet discussed and encouraged.   Lipid Panel  Lab Results  Component Value Date   CHOL 168 05/31/2015   HDL 47 05/31/2015    LDLCALC 90 05/31/2015   TRIG 156* 05/31/2015   CHOLHDL 3.6 05/31/2015   Needs to reduce fat intake , TG elevated , otherwise excellent, no med change     Colon cancer screening Heme negative stool  GERD without esophagitis continue medication currently taking, currently controlled

## 2015-06-05 NOTE — Assessment & Plan Note (Signed)
Updated lab needed , checked on day of visit

## 2015-06-05 NOTE — Assessment & Plan Note (Signed)
continue medication currently taking, currently controlled

## 2015-06-05 NOTE — Assessment & Plan Note (Signed)
Controlled, no change in medication DASH diet and commitment to daily physical activity for a minimum of 30 minutes discussed and encouraged, as a part of hypertension management. The importance of attaining a healthy weight is also discussed.  BP/Weight 05/31/2015 03/28/2015 03/14/2015 01/20/2015 12/21/2014 [id number] 123456  Systolic BP 123456 0000000 [id number] XX123456 123456 123XX123 [id number]  Diastolic BP 78 82 123XX123 76 82 62 89  Wt. (Lbs) 216.12 - 208 - 213 205 -  BMI 34.9 - 33.59 - 34.4 33.1 -

## 2015-06-15 ENCOUNTER — Ambulatory Visit (INDEPENDENT_AMBULATORY_CARE_PROVIDER_SITE_OTHER): Payer: Medicare Other | Admitting: *Deleted

## 2015-06-15 DIAGNOSIS — Z5181 Encounter for therapeutic drug level monitoring: Secondary | ICD-10-CM | POA: Diagnosis not present

## 2015-06-15 DIAGNOSIS — I2699 Other pulmonary embolism without acute cor pulmonale: Secondary | ICD-10-CM

## 2015-06-15 LAB — POCT INR: INR: 1.8

## 2015-06-23 ENCOUNTER — Other Ambulatory Visit: Payer: Self-pay

## 2015-06-23 MED ORDER — HYDROCODONE-ACETAMINOPHEN 5-325 MG PO TABS: ORAL_TABLET | ORAL | Status: DC

## 2015-06-29 ENCOUNTER — Other Ambulatory Visit: Payer: Self-pay

## 2015-06-29 MED ORDER — PRAVASTATIN SODIUM 20 MG PO TABS: ORAL_TABLET | ORAL | Status: DC

## 2015-06-29 MED ORDER — TRIAMTERENE-HCTZ 37.5-25 MG PO TABS: 1.0000 | ORAL_TABLET | Freq: Every day | ORAL | Status: DC

## 2015-07-15 ENCOUNTER — Other Ambulatory Visit: Payer: Self-pay

## 2015-07-15 MED ORDER — HYDROCODONE-ACETAMINOPHEN 5-325 MG PO TABS: ORAL_TABLET | ORAL | Status: DC

## 2015-07-19 DIAGNOSIS — E119 Type 2 diabetes mellitus without complications: Secondary | ICD-10-CM | POA: Diagnosis not present

## 2015-07-19 LAB — HM DIABETES EYE EXAM

## 2015-07-31 NOTE — Telephone Encounter (Signed)
No additional notes

## 2015-08-03 ENCOUNTER — Other Ambulatory Visit: Payer: Self-pay | Admitting: Family Medicine

## 2015-08-09 ENCOUNTER — Other Ambulatory Visit: Payer: Self-pay

## 2015-08-09 MED ORDER — HYDROCODONE-ACETAMINOPHEN 5-325 MG PO TABS: ORAL_TABLET | ORAL | Status: DC

## 2015-08-13 ENCOUNTER — Telehealth: Payer: Self-pay | Admitting: Family Medicine

## 2015-08-13 NOTE — Telephone Encounter (Signed)
Pls contact this pt. She is overdue on mammogram , the last one in 06/2014 recommended 6 month follow up, she still has not followed through, NEEDS the studies

## 2015-08-16 NOTE — Telephone Encounter (Signed)
Called and left message for patient to return call.  

## 2015-08-16 NOTE — Telephone Encounter (Signed)
Patient aware.

## 2015-08-17 ENCOUNTER — Ambulatory Visit (INDEPENDENT_AMBULATORY_CARE_PROVIDER_SITE_OTHER): Payer: Medicare Other | Admitting: *Deleted

## 2015-08-17 DIAGNOSIS — I2699 Other pulmonary embolism without acute cor pulmonale: Secondary | ICD-10-CM | POA: Diagnosis not present

## 2015-08-17 DIAGNOSIS — Z5181 Encounter for therapeutic drug level monitoring: Secondary | ICD-10-CM

## 2015-08-17 LAB — POCT INR: INR: 3.7

## 2015-08-29 DIAGNOSIS — Z886 Allergy status to analgesic agent status: Secondary | ICD-10-CM | POA: Diagnosis not present

## 2015-08-29 DIAGNOSIS — Z981 Arthrodesis status: Secondary | ICD-10-CM | POA: Diagnosis not present

## 2015-08-29 DIAGNOSIS — H524 Presbyopia: Secondary | ICD-10-CM | POA: Diagnosis not present

## 2015-08-29 DIAGNOSIS — Z833 Family history of diabetes mellitus: Secondary | ICD-10-CM | POA: Diagnosis not present

## 2015-08-29 DIAGNOSIS — M810 Age-related osteoporosis without current pathological fracture: Secondary | ICD-10-CM | POA: Diagnosis not present

## 2015-08-29 DIAGNOSIS — G47419 Narcolepsy without cataplexy: Secondary | ICD-10-CM | POA: Diagnosis not present

## 2015-08-29 DIAGNOSIS — E785 Hyperlipidemia, unspecified: Secondary | ICD-10-CM | POA: Diagnosis not present

## 2015-08-29 DIAGNOSIS — Z538 Procedure and treatment not carried out for other reasons: Secondary | ICD-10-CM | POA: Diagnosis not present

## 2015-08-29 DIAGNOSIS — Z8249 Family history of ischemic heart disease and other diseases of the circulatory system: Secondary | ICD-10-CM | POA: Diagnosis not present

## 2015-08-29 DIAGNOSIS — G4730 Sleep apnea, unspecified: Secondary | ICD-10-CM | POA: Diagnosis not present

## 2015-08-29 DIAGNOSIS — Z79899 Other long term (current) drug therapy: Secondary | ICD-10-CM | POA: Diagnosis not present

## 2015-08-29 DIAGNOSIS — E559 Vitamin D deficiency, unspecified: Secondary | ICD-10-CM | POA: Diagnosis not present

## 2015-08-29 DIAGNOSIS — Z7901 Long term (current) use of anticoagulants: Secondary | ICD-10-CM | POA: Diagnosis not present

## 2015-08-29 DIAGNOSIS — H25013 Cortical age-related cataract, bilateral: Secondary | ICD-10-CM | POA: Diagnosis not present

## 2015-08-29 DIAGNOSIS — H2513 Age-related nuclear cataract, bilateral: Secondary | ICD-10-CM | POA: Diagnosis not present

## 2015-08-29 DIAGNOSIS — E113292 Type 2 diabetes mellitus with mild nonproliferative diabetic retinopathy without macular edema, left eye: Secondary | ICD-10-CM | POA: Diagnosis not present

## 2015-08-29 DIAGNOSIS — I10 Essential (primary) hypertension: Secondary | ICD-10-CM | POA: Diagnosis not present

## 2015-08-29 DIAGNOSIS — Z86711 Personal history of pulmonary embolism: Secondary | ICD-10-CM | POA: Diagnosis not present

## 2015-08-29 DIAGNOSIS — K219 Gastro-esophageal reflux disease without esophagitis: Secondary | ICD-10-CM | POA: Diagnosis not present

## 2015-08-29 DIAGNOSIS — H31012 Macula scars of posterior pole (postinflammatory) (post-traumatic), left eye: Secondary | ICD-10-CM | POA: Diagnosis not present

## 2015-09-02 ENCOUNTER — Other Ambulatory Visit: Payer: Self-pay | Admitting: Family Medicine

## 2015-09-14 ENCOUNTER — Ambulatory Visit (INDEPENDENT_AMBULATORY_CARE_PROVIDER_SITE_OTHER): Payer: Medicare Other | Admitting: *Deleted

## 2015-09-14 DIAGNOSIS — Z5181 Encounter for therapeutic drug level monitoring: Secondary | ICD-10-CM | POA: Diagnosis not present

## 2015-09-14 DIAGNOSIS — I2699 Other pulmonary embolism without acute cor pulmonale: Secondary | ICD-10-CM | POA: Diagnosis not present

## 2015-09-14 LAB — POCT INR: INR: 3.1

## 2015-09-18 ENCOUNTER — Other Ambulatory Visit: Payer: Self-pay | Admitting: Family Medicine

## 2015-10-11 DIAGNOSIS — E119 Type 2 diabetes mellitus without complications: Secondary | ICD-10-CM | POA: Diagnosis not present

## 2015-10-27 ENCOUNTER — Other Ambulatory Visit: Payer: Self-pay | Admitting: Family Medicine

## 2015-11-04 ENCOUNTER — Other Ambulatory Visit: Payer: Self-pay | Admitting: Adult Health

## 2015-11-09 ENCOUNTER — Ambulatory Visit (INDEPENDENT_AMBULATORY_CARE_PROVIDER_SITE_OTHER): Payer: Medicare Other | Admitting: *Deleted

## 2015-11-09 DIAGNOSIS — Z5181 Encounter for therapeutic drug level monitoring: Secondary | ICD-10-CM | POA: Diagnosis not present

## 2015-11-09 DIAGNOSIS — I2699 Other pulmonary embolism without acute cor pulmonale: Secondary | ICD-10-CM

## 2015-11-09 LAB — POCT INR: INR: 2.3

## 2015-11-22 ENCOUNTER — Ambulatory Visit (INDEPENDENT_AMBULATORY_CARE_PROVIDER_SITE_OTHER): Payer: Medicare Other | Admitting: Family Medicine

## 2015-11-22 ENCOUNTER — Other Ambulatory Visit: Payer: Self-pay

## 2015-11-22 ENCOUNTER — Encounter: Payer: Self-pay | Admitting: Family Medicine

## 2015-11-22 VITALS — BP 124/82 | HR 83 | Resp 16 | Ht 66.0 in | Wt 225.0 lb

## 2015-11-22 DIAGNOSIS — G8929 Other chronic pain: Secondary | ICD-10-CM

## 2015-11-22 DIAGNOSIS — Z1231 Encounter for screening mammogram for malignant neoplasm of breast: Secondary | ICD-10-CM

## 2015-11-22 DIAGNOSIS — I10 Essential (primary) hypertension: Secondary | ICD-10-CM

## 2015-11-22 DIAGNOSIS — E785 Hyperlipidemia, unspecified: Secondary | ICD-10-CM

## 2015-11-22 DIAGNOSIS — Z2821 Immunization not carried out because of patient refusal: Secondary | ICD-10-CM

## 2015-11-22 DIAGNOSIS — M542 Cervicalgia: Secondary | ICD-10-CM | POA: Diagnosis not present

## 2015-11-22 DIAGNOSIS — R0609 Other forms of dyspnea: Secondary | ICD-10-CM | POA: Diagnosis not present

## 2015-11-22 DIAGNOSIS — E669 Obesity, unspecified: Secondary | ICD-10-CM

## 2015-11-22 DIAGNOSIS — E119 Type 2 diabetes mellitus without complications: Secondary | ICD-10-CM | POA: Diagnosis not present

## 2015-11-22 DIAGNOSIS — L299 Pruritus, unspecified: Secondary | ICD-10-CM | POA: Diagnosis not present

## 2015-11-22 DIAGNOSIS — E1169 Type 2 diabetes mellitus with other specified complication: Secondary | ICD-10-CM

## 2015-11-22 MED ORDER — GABAPENTIN 300 MG PO CAPS: 300.0000 mg | ORAL_CAPSULE | Freq: Three times a day (TID) | ORAL | Status: DC

## 2015-11-22 MED ORDER — HYDROXYZINE HCL 50 MG PO TABS: ORAL_TABLET | ORAL | Status: DC

## 2015-11-22 NOTE — Assessment & Plan Note (Addendum)
Uncontrolled, may need pain clinic, x ray of neck and start gabapentin on a regular schedule

## 2015-11-22 NOTE — Assessment & Plan Note (Signed)
4 month h/o worsening exertional fatigue, will check EKG, of not excess weight gain and lack of regular exeercise a problem also neck pain uncontrolled

## 2015-11-22 NOTE — Progress Notes (Signed)
Subjective:    Patient ID: Stephanie Singleton, female    DOB: 12/10/1945, 70 y.o.   MRN: [id number]  HPI   Stephanie Singleton     MRN: [id number]      DOB: March 16, 1946   HPI [name] is here for follow up and re-evaluation of chronic medical conditions, medication management and review of any available recent lab and radiology data.  .Continues to refuse and is behind in screening, re educated  Questions or concerns regarding consultations or procedures which the PT has had in the interim are  addressed. The PT denies any adverse reactions to current medications since the last visit.  States neck pain is worsened and hydrocodone not b beneficial has stopped taking the med  C/o increased exertional fatigue in past months and occasional chest pain  ROS Denies recent fever or chills. Denies sinus pressure, nasal congestion, ear pain or sore throat. Denies chest congestion, productive cough or wheezing. Denies PND, orthopnea, palpitations and leg swelling Denies abdominal pain, nausea, vomiting,diarrhea or constipation.   Denies dysuria, frequency, hesitancy or incontinence. Denies headaches, seizures, numbness, or tingling. Denies depression, anxiety or insomnia. Denies skin break down or rash.   PE  BP 124/82 mmHg  Pulse 83  Resp 16  Ht 5\' 6"  (1.676 m)  Wt 225 lb (102.059 kg)  BMI 36.33 kg/m2  SpO2 100%  Patient alert and oriented and in no cardiopulmonary distress.  HEENT: No facial asymmetry, EOMI,   oropharynx pink and moist.  Neck decreased ROM no JVD, no mass.  Chest: Clear to auscultation bilaterally.  CVS: S1, S2 no murmurs, no S3.Regular rate.  ABD: Soft non tender.   Ext: No edema  MS: Adequate ROM spine, shoulders, hips and knees.  Skin: Intact, no ulcerations or rash noted.  Psych: Good eye contact, normal affect. Memory intact not anxious or depressed appearing.  CNS: CN 2-12 intact, power,  normal throughout.no focal deficits  noted.   Assessment & Plan   Dyspnea on exertion 4 month h/o worsening exertional fatigue, will check EKG, of not excess weight gain and lack of regular exeercise a problem also neck pain uncontrolled  Neck pain, chronic Uncontrolled, may need pain clinic, x ray of neck and start gabapentin on a regular schedule  Hypertension goal BP (blood pressure) < 130/80 Controlled, no change in medication DASH diet and commitment to daily physical activity for a minimum of 30 minutes discussed and encouraged, as a part of hypertension management. The importance of attaining a healthy weight is also discussed.  BP/Weight 11/22/2015 05/31/2015 03/28/2015 03/14/2015 01/20/2015 12/21/2014 [id number]  Systolic BP [id number] 123456 0000000 [id number] [id number] 123456 123XX123  Diastolic BP 82 78 82 123XX123 76 82 62  Wt. (Lbs) 225 216.12 - 208 - 213 205  BMI 36.33 34.9 - 33.59 - 34.4 33.1        Diabetes mellitus type 2 in obese [facility]) Stephanie Singleton is reminded of the importance of commitment to daily physical activity for 30 minutes or more, as able and the need to limit carbohydrate intake to 30 to 60 grams per meal to help with blood sugar control.   The need to take medication as prescribed, test blood sugar as directed, and to call between visits if there is a concern that blood sugar is uncontrolled is also discussed.   Stephanie Singleton is reminded of the importance of daily foot exam, annual eye examination, and good blood sugar, blood pressure and cholesterol control. Updated lab needed at/ before next visit.  Diabetic Labs Latest Ref Rng 11/22/2015 05/31/2015 11/24/2014 11/23/2014 04/13/2014  HbA1c <5.7 % - 5.9(H) - 5.8(H) 6.1(H)  Microalbumin Not estab mg/dL 0.4 - 2.8(H) - -  Micro/Creat Ratio <30 mcg/mg creat 3 - 23.2 - -  Chol 125 - 200 mg/dL - 168 - 168 164  HDL >=46 mg/dL - 47 - 39(L) 40  Calc LDL <130 mg/dL - 90 - 90 89  Triglycerides <150 mg/dL - 156(H) - 197(H) 174(H)  Creatinine 0.50 - 0.99 mg/dL - 1.14(H) - 1.34(H)  0.81   BP/Weight 11/22/2015 05/31/2015 03/28/2015 03/14/2015 01/20/2015 12/21/2014 [id number]  Systolic BP [id number] 123456 0000000 [id number] [id number] 123456 123XX123  Diastolic BP 82 78 82 123XX123 76 82 62  Wt. (Lbs) 225 216.12 - 208 - 213 205  BMI 36.33 34.9 - 33.59 - 34.4 33.1   Foot/eye exam completion dates Latest Ref Rng 07/19/2015 11/23/2014  Eye Exam No Retinopathy Retinopathy(A) -  Foot Form Completion - - Done         Obesity (BMI 30.0-34.9) Deteriorated. Patient re-educated about  the importance of commitment to a  minimum of 150 minutes of exercise per week.  The importance of healthy food choices with portion control discussed. Encouraged to start a food diary, count calories and to consider  joining a support group. Sample diet sheets offered. Goals set by the patient for the next several months.   Weight /BMI 11/22/2015 05/31/2015 03/14/2015  WEIGHT 225 lb 216 lb 1.9 oz 208 lb  HEIGHT 5\' 6"  5\' 6"  -  BMI 36.33 kg/m2 34.9 kg/m2 33.59 kg/m2    Current exercise per week 30 min   Vaccination not carried out because of patient refusal unchanged       Review of Systems     Objective:   Physical Exam        Assessment & Plan:

## 2015-11-22 NOTE — Patient Instructions (Signed)
Annual; wellness in 2.5 month, call if you need me before  EKG today due increased fatigue with activity, no evidence of heart damage on EKG , so that is GOOD to know. Work on weight loss and  commiting to moving around a boit more regularly every day. ALSO reducing the hydroxyzine to ONLY ONCE daily, at MOST, dose is 50 mg , taking more than one daily will ABSOLUTELY make you "tired" Also since you need medication like gabapentin for nerve pain, try to do without the hydroxyzine as they both hae a sleepy side effect, the gabapentin is more important for pain management  New for pain is gabapentin  New for management of pain due to nerve irritation is gabapentin 100mg .  Start one capsule at bedtime, after 3 to 5 days increase , if needed, to two capsules at bedtime, and after an additional 5 days to 3 capsules at bedtime if needed , for pain control.  Please note, the script is written as one three times daily, DO NOT take like this, instead take only at bedtime as above.  Fasting labs in June.  You NEED your mammogram, call this office for appointment info and keep appt as PROMISED ( not on Monday, and [facility] appt [facility])

## 2015-11-23 LAB — MICROALBUMIN / CREATININE URINE RATIO
Creatinine, Urine: 123 mg/dL (ref 20–320)
Microalb Creat Ratio: 3 mcg/mg creat (ref <30)
Microalb, Ur: 0.4 mg/dL

## 2015-11-24 ENCOUNTER — Other Ambulatory Visit: Payer: Self-pay | Admitting: Family Medicine

## 2015-11-24 DIAGNOSIS — R921 Mammographic calcification found on diagnostic imaging of breast: Secondary | ICD-10-CM

## 2015-11-30 ENCOUNTER — Ambulatory Visit
Admission: RE | Admit: 2015-11-30 | Discharge: 2015-11-30 | Disposition: A | Discharge status: Home / Self Care | Payer: Medicare Other | Source: Ambulatory Visit | Attending: Family Medicine | Admitting: Family Medicine

## 2015-11-30 DIAGNOSIS — R921 Mammographic calcification found on diagnostic imaging of breast: Secondary | ICD-10-CM | POA: Diagnosis not present

## 2015-12-01 NOTE — Assessment & Plan Note (Signed)
unchanged

## 2015-12-01 NOTE — Assessment & Plan Note (Signed)
Deteriorated. Patient re-educated about  the importance of commitment to a  minimum of 150 minutes of exercise per week.  The importance of healthy food choices with portion control discussed. Encouraged to start a food diary, count calories and to consider  joining a support group. Sample diet sheets offered. Goals set by the patient for the next several months.   Weight /BMI 11/22/2015 05/31/2015 03/14/2015  WEIGHT 225 lb 216 lb 1.9 oz 208 lb  HEIGHT 5\' 6"  5\' 6"  -  BMI 36.33 kg/m2 34.9 kg/m2 33.59 kg/m2    Current exercise per week 30 min

## 2015-12-01 NOTE — Assessment & Plan Note (Signed)
Stephanie Singleton is reminded of the importance of commitment to daily physical activity for 30 minutes or more, as able and the need to limit carbohydrate intake to 30 to 60 grams per meal to help with blood sugar control.   The need to take medication as prescribed, test blood sugar as directed, and to call between visits if there is a concern that blood sugar is uncontrolled is also discussed.   Stephanie Singleton is reminded of the importance of daily foot exam, annual eye examination, and good blood sugar, blood pressure and cholesterol control. Updated lab needed at/ before next visit.   Diabetic Labs Latest Ref Rng 11/22/2015 05/31/2015 11/24/2014 11/23/2014 04/13/2014  HbA1c <5.7 % - 5.9(H) - 5.8(H) 6.1(H)  Microalbumin Not estab mg/dL 0.4 - 2.8(H) - -  Micro/Creat Ratio <30 mcg/mg creat 3 - 23.2 - -  Chol 125 - 200 mg/dL - 168 - 168 164  HDL >=46 mg/dL - 47 - 39(L) 40  Calc LDL <130 mg/dL - 90 - 90 89  Triglycerides <150 mg/dL - 156(H) - 197(H) 174(H)  Creatinine 0.50 - 0.99 mg/dL - 1.14(H) - 1.34(H) 0.81   BP/Weight 11/22/2015 05/31/2015 03/28/2015 03/14/2015 01/20/2015 12/21/2014 [id number]  Systolic BP A999333 123456 0000000 [id number] 123456 [id number]  Diastolic BP 82 78 82 [id number] 76 82 62  Wt. (Lbs) 225 216.12 - 208 - 213 205  BMI 36.33 34.9 - 33.59 - 34.4 33.1   Foot/eye exam completion dates Latest Ref Rng 07/19/2015 11/23/2014  Eye Exam No Retinopathy Retinopathy(A) -  Foot Form Completion - - Done

## 2015-12-01 NOTE — Assessment & Plan Note (Signed)
Controlled, no change in medication DASH diet and commitment to daily physical activity for a minimum of 30 minutes discussed and encouraged, as a part of hypertension management. The importance of attaining a healthy weight is also discussed.  BP/Weight 11/22/2015 05/31/2015 03/28/2015 03/14/2015 01/20/2015 12/21/2014 [id number]  Systolic BP A999333 123456 0000000 [id number] 123456 [id number]  Diastolic BP 82 78 82 [id number] 76 82 62  Wt. (Lbs) 225 216.12 - 208 - 213 205  BMI 36.33 34.9 - 33.59 - 34.4 33.1

## 2015-12-14 ENCOUNTER — Other Ambulatory Visit: Payer: Self-pay | Admitting: Family Medicine

## 2015-12-14 ENCOUNTER — Ambulatory Visit (INDEPENDENT_AMBULATORY_CARE_PROVIDER_SITE_OTHER): Payer: Medicare Other | Admitting: *Deleted

## 2015-12-14 DIAGNOSIS — E119 Type 2 diabetes mellitus without complications: Secondary | ICD-10-CM | POA: Diagnosis not present

## 2015-12-14 DIAGNOSIS — Z5181 Encounter for therapeutic drug level monitoring: Secondary | ICD-10-CM | POA: Diagnosis not present

## 2015-12-14 DIAGNOSIS — E785 Hyperlipidemia, unspecified: Secondary | ICD-10-CM | POA: Diagnosis not present

## 2015-12-14 DIAGNOSIS — I10 Essential (primary) hypertension: Secondary | ICD-10-CM | POA: Diagnosis not present

## 2015-12-14 DIAGNOSIS — E559 Vitamin D deficiency, unspecified: Secondary | ICD-10-CM | POA: Diagnosis not present

## 2015-12-14 DIAGNOSIS — I2699 Other pulmonary embolism without acute cor pulmonale: Secondary | ICD-10-CM

## 2015-12-14 LAB — POCT INR: INR: 3.3

## 2015-12-15 LAB — CBC
HCT: 38.6 % (ref 35.0–45.0)
Hemoglobin: 13.5 g/dL (ref 11.7–15.5)
MCH: 28.2 pg (ref 27.0–33.0)
MCHC: 35 g/dL (ref 32.0–36.0)
MCV: 80.8 fL (ref 80.0–100.0)
MPV: 9.4 fL (ref 7.5–12.5)
Platelets: 228 10*3/uL (ref 140–400)
RBC: 4.78 MIL/uL (ref 3.80–5.10)
RDW: 14.9 % (ref 11.0–15.0)
WBC: 6.5 10*3/uL (ref 3.8–10.8)

## 2015-12-16 LAB — COMPLETE METABOLIC PANEL WITH GFR
ALBUMIN: 4.1 g/dL (ref 3.6–5.1)
ALK PHOS: 85 U/L (ref 33–130)
ALT: 14 U/L (ref 6–29)
AST: 21 U/L (ref 10–35)
BILIRUBIN TOTAL: 0.6 mg/dL (ref 0.2–1.2)
BUN: 18 mg/dL (ref 7–25)
CALCIUM: 9.5 mg/dL (ref 8.6–10.4)
CHLORIDE: 99 mmol/L (ref 98–110)
CO2: 20 mmol/L (ref 20–31)
CREATININE: 1.18 mg/dL — AB (ref 0.50–0.99)
GFR, EST AFRICAN AMERICAN: 55 mL/min — AB (ref >=60)
GFR, Est Non African American: 48 mL/min — ABNORMAL LOW (ref >=60)
Glucose, Bld: 137 mg/dL — ABNORMAL HIGH (ref 65–99)
Potassium: 4.6 mmol/L (ref 3.5–5.3)
Sodium: 137 mmol/L (ref 135–146)
TOTAL PROTEIN: 7.5 g/dL (ref 6.1–8.1)

## 2015-12-16 LAB — LIPID PANEL
Cholesterol: 177 mg/dL (ref 125–200)
HDL: 43 mg/dL — AB (ref >=46)
LDL CALC: 93 mg/dL (ref <130)
TRIGLYCERIDES: 205 mg/dL — AB (ref <150)
Total CHOL/HDL Ratio: 4.1 Ratio (ref <=5.0)
VLDL: 41 mg/dL — AB (ref <30)

## 2015-12-17 LAB — VITAMIN D 25 HYDROXY (VIT D DEFICIENCY, FRACTURES): Vit D, 25-Hydroxy: 74 ng/mL (ref 30–100)

## 2015-12-17 LAB — HEMOGLOBIN A1C
HEMOGLOBIN A1C: 6.6 % — AB (ref <5.7)
Mean Plasma Glucose: 143 mg/dL

## 2015-12-18 ENCOUNTER — Other Ambulatory Visit: Payer: Self-pay | Admitting: Family Medicine

## 2016-01-11 ENCOUNTER — Other Ambulatory Visit: Payer: Self-pay | Admitting: Family Medicine

## 2016-02-06 ENCOUNTER — Other Ambulatory Visit: Payer: Self-pay | Admitting: Family Medicine

## 2016-03-06 ENCOUNTER — Other Ambulatory Visit: Payer: Self-pay | Admitting: Adult Health

## 2016-03-07 ENCOUNTER — Ambulatory Visit (INDEPENDENT_AMBULATORY_CARE_PROVIDER_SITE_OTHER): Payer: Medicare Other | Admitting: *Deleted

## 2016-03-07 DIAGNOSIS — Z5181 Encounter for therapeutic drug level monitoring: Secondary | ICD-10-CM

## 2016-03-07 DIAGNOSIS — I2699 Other pulmonary embolism without acute cor pulmonale: Secondary | ICD-10-CM

## 2016-03-07 DIAGNOSIS — I272 Other secondary pulmonary hypertension: Secondary | ICD-10-CM | POA: Diagnosis not present

## 2016-03-07 LAB — POCT INR: INR: 3

## 2016-03-14 ENCOUNTER — Other Ambulatory Visit: Payer: Self-pay | Admitting: Neurosurgery

## 2016-03-14 DIAGNOSIS — D3611 Benign neoplasm of peripheral nerves and autonomic nervous system of face, head, and neck: Secondary | ICD-10-CM

## 2016-03-22 ENCOUNTER — Other Ambulatory Visit: Payer: Medicare Other

## 2016-03-27 ENCOUNTER — Other Ambulatory Visit: Payer: Self-pay | Admitting: Neurosurgery

## 2016-03-27 ENCOUNTER — Ambulatory Visit
Admission: RE | Admit: 2016-03-27 | Discharge: 2016-03-27 | Disposition: A | Discharge status: Home / Self Care | Payer: Medicare Other | Source: Ambulatory Visit | Attending: Neurosurgery | Admitting: Neurosurgery

## 2016-03-27 DIAGNOSIS — D3611 Benign neoplasm of peripheral nerves and autonomic nervous system of face, head, and neck: Secondary | ICD-10-CM

## 2016-03-27 DIAGNOSIS — M4802 Spinal stenosis, cervical region: Secondary | ICD-10-CM | POA: Diagnosis not present

## 2016-04-04 ENCOUNTER — Encounter: Payer: Self-pay | Admitting: Cardiology

## 2016-04-14 ENCOUNTER — Other Ambulatory Visit: Payer: Self-pay | Admitting: Family Medicine

## 2016-04-18 ENCOUNTER — Encounter (INDEPENDENT_AMBULATORY_CARE_PROVIDER_SITE_OTHER): Payer: Self-pay

## 2016-04-18 ENCOUNTER — Ambulatory Visit (INDEPENDENT_AMBULATORY_CARE_PROVIDER_SITE_OTHER): Payer: Medicare Other | Admitting: Cardiology

## 2016-04-18 ENCOUNTER — Other Ambulatory Visit: Payer: Self-pay | Admitting: Family Medicine

## 2016-04-18 ENCOUNTER — Encounter: Payer: Self-pay | Admitting: Cardiology

## 2016-04-18 ENCOUNTER — Ambulatory Visit (INDEPENDENT_AMBULATORY_CARE_PROVIDER_SITE_OTHER): Payer: Medicare Other | Admitting: Pharmacist

## 2016-04-18 VITALS — BP 120/82 | HR 81 | Ht 66.0 in | Wt 230.2 lb

## 2016-04-18 DIAGNOSIS — Z5181 Encounter for therapeutic drug level monitoring: Secondary | ICD-10-CM

## 2016-04-18 DIAGNOSIS — Z86711 Personal history of pulmonary embolism: Secondary | ICD-10-CM

## 2016-04-18 DIAGNOSIS — I5033 Acute on chronic diastolic (congestive) heart failure: Secondary | ICD-10-CM

## 2016-04-18 DIAGNOSIS — I739 Peripheral vascular disease, unspecified: Secondary | ICD-10-CM | POA: Diagnosis not present

## 2016-04-18 DIAGNOSIS — I119 Hypertensive heart disease without heart failure: Secondary | ICD-10-CM

## 2016-04-18 DIAGNOSIS — E785 Hyperlipidemia, unspecified: Secondary | ICD-10-CM | POA: Diagnosis not present

## 2016-04-18 DIAGNOSIS — I272 Pulmonary hypertension, unspecified: Secondary | ICD-10-CM

## 2016-04-18 DIAGNOSIS — I2699 Other pulmonary embolism without acute cor pulmonale: Secondary | ICD-10-CM

## 2016-04-18 DIAGNOSIS — I10 Essential (primary) hypertension: Secondary | ICD-10-CM

## 2016-04-18 LAB — POCT INR: INR: 2.6

## 2016-04-18 MED ORDER — POTASSIUM CHLORIDE CRYS ER 20 MEQ PO TBCR: 20.0000 meq | EXTENDED_RELEASE_TABLET | ORAL | 2 refills | Status: DC

## 2016-04-18 MED ORDER — TORSEMIDE 20 MG PO TABS: ORAL_TABLET | ORAL | 6 refills | Status: DC

## 2016-04-18 NOTE — Progress Notes (Signed)
Cardiology Office Note    Date:  04/19/2016   ID:  Stephanie Singleton, DOB 07-27-1945, MRN [id number]  PCP:  Tula Nakayama, MD  Cardiologist:  Ena Dawley, MD   Chief complain: Establish cardiology care in [facility]  History of Present Illness:  Stephanie Singleton is a 70 y.o. female, previously followed by Jory Sims, who just transferred to [facility]. 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 She hasn't been seen in 1 year.  Today she states that her LE edema has been controlled by torsemide. She has been experiencing LLE pain while walking short distances. She underwent cervical fusion surgery and is experiencing sharp shooting  pain in her upper and lower extremities. Denies chest pain or SOB. No orthopnea, PND, no palpitations or syncope.    Past Medical History:  Diagnosis Date  . Diabetes mellitus DX X 1 YR   USE METFORMIN ACCORDING TO CBG RESULT  . Embolism - blood clot 2004   Pulmonary .on coumadin  . GERD (gastroesophageal reflux disease)    ON PREVACID  . HTN (hypertension)    EKG 10/23 IN EPIC  . Hyperlipidemia   . Hypertension    CARDIOLOGY VISIT.CARDIAC CLEARANCE.LOVENOX INSTRUCTIONS.  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 OSA (obstructive sleep apnea) DX 8 YRS AGO.DOES NOT WEAR MASK  . Tumor of soft tissue of neck     Past Surgical History:  Procedure Laterality Date  . CERVICAL FUSION      Current Medications: Outpatient Medications Prior to Visit  Medication Sig Dispense Refill  . gabapentin (NEURONTIN) 300 MG capsule Take 1 capsule (300 mg total) by mouth 3 (three) times daily. 90 capsule 3  . hydrOXYzine (ATARAX/VISTARIL) 50 MG tablet TAKE 1 TABLET (50 MG TOTAL) BY MOUTH AT BEDTIME. 30 tablet 3  . losartan (COZAAR) 100 MG tablet TAKE 1 TABLET (100 MG TOTAL) BY MOUTH DAILY. 90 tablet 1  .  pantoprazole (PROTONIX) 40 MG tablet TAKE 1 TABLET (40 MG TOTAL) BY MOUTH DAILY. 90 tablet 3  . pravastatin (PRAVACHOL) 20 MG tablet TAKE 1 TABLET BY MOUTH DAILY 90 tablet 0  . triamterene-hydrochlorothiazide (MAXZIDE-25) 37.5-25 MG tablet TAKE 1 TABLET BY MOUTH DAILY. 90 tablet 0  . warfarin (COUMADIN) 5 MG tablet Take 1/2 tablet daily or as directed 30 tablet 0  . hydrOXYzine (ATARAX/VISTARIL) 50 MG tablet One tablet at bedtime, as needed for itching 30 tablet 3  . potassium chloride SA (K-DUR,KLOR-CON) 20 MEQ tablet Take 1 tablet (20 mEq total) by mouth 2 (two) times a week. *Take only on the days that Demadex (TORSEMIDE) is taken* 30 tablet 2  . torsemide (DEMADEX) 20 MG tablet TAKE 1 TABLET 2 TIMES A WEEK AS NEEDED FOR SWELLING 24 tablet 1   No facility-administered medications prior to visit.      Allergies:   Aspirin and Lisinopril   Social History   Social History  . Marital status: Divorced    Spouse name: N/A  . Number of children: 23  . Years of education: N/A   Occupational History  .  Disabled     [place]   Social History Main Topics  . Smoking status: Former Research scientist (life sciences)  . Smokeless tobacco: Never Used  . Alcohol use No  . Drug use: No  . Sexual activity: Yes   Other Topics Concern  . None  Social History Narrative  . None     Family History:  The patient's family history includes Diabetes in her father; Heart disease in her brother and mother; Hypertension in her mother; Peripheral vascular disease in her brother and father; Stroke in her mother.   ROS:   Please see the history of present illness.    ROS All other systems reviewed and are negative.   PHYSICAL EXAM:   VS:  BP 120/82   Pulse 81   Ht 5\' 6"  (1.676 m)   Wt 230 lb 3.2 oz (104.4 kg)   SpO2 98%   BMI 37.16 kg/m    GEN: Well nourished, well developed, in no acute distress  HEENT: normal  Neck: no JVD, carotid bruits, or masses Cardiac: RRR; no murmurs, rubs, or gallops,no edema  Respiratory:   clear to auscultation bilaterally, normal work of breathing GI: soft, nontender, nondistended, + BS MS: no deformity or atrophy  Skin: warm and dry, no rash Neuro:  Alert and Oriented x 3, Strength and sensation are intact Psych: euthymic mood, full affect  Wt Readings from Last 3 Encounters:  04/18/16 230 lb 3.2 oz (104.4 kg)  11/22/15 225 lb (102.1 kg)  05/31/15 216 lb 1.9 oz (98 kg)    Studies/Labs Reviewed:   EKG:  EKG is ordered today.  The ekg ordered today demonstrates SR, normal ECG.  Recent Labs: 05/31/2015: TSH 0.446 12/14/2015: ALT 14; BUN 18; Creat 1.18; Hemoglobin 13.5; Platelets 228; Potassium 4.6; Sodium 137   Lipid Panel    Component Value Date/Time   CHOL 177 12/14/2015 1339   TRIG 205 (H) 12/14/2015 1339   HDL 43 (L) 12/14/2015 1339   CHOLHDL 4.1 12/14/2015 1339   VLDL 41 (H) 12/14/2015 1339   LDLCALC 93 12/14/2015 1339   TTE: 02/26/2013 Left ventricle: The cavity size was mildly reduced. There was moderate focal basal hypertrophy. Systolic function was vigorous. The estimated ejection fraction was in the range of 65% to 70%. Wall motion was normal; there were no regional wall motion abnormalities. Doppler parameters are consistent with abnormal left ventricular relaxation (grade 1 diastolic dysfunction). - Aortic valve: Trileaflet; mildly calcified leaflets. No significant regurgitation. - Mitral valve: Calcified annulus. Trivial regurgitation. - Right atrium: Central venous pressure: 42mm Hg (est). - Tricuspid valve: Trivial regurgitation. - Pulmonary arteries: Systolic pressure could not be accurately estimated. - Pericardium, extracardiac: A prominent pericardial fat pad was present. Impressions:  - No prior study for comparison. Moderate basal septal hypertrophy with LVEF [id number], grade 1 diastolic dysfunction. Mild MAC and aortic valve calcification. Trace tricuspid regurgitation. Unable to assess PASP. Normal  CVP.  ECG: SR, normal ECG   ASSESSMENT:    1. Encounter for therapeutic drug monitoring   2. Claudication ([place])   3. History of pulmonary embolism   4. Hyperlipidemia LDL goal <100   5. Hypertensive heart disease without heart failure   6. Pulmonary embolism and infarction ([place])   7. Pulmonary hypertension   8. Acute on chronic diastolic CHF (congestive heart failure), NYHA class 3 ([place])   9. Essential hypertension      PLAN:  In order of problems listed above:  1. H/o PE, pulmonary hypertension - on the most recent echo, normal RVSP, normal RV function 2. 2. HTN - controlled 3. 3. Claudication - order a LLE arterial US 4. Acute on chronic diastolic CHF - euvolemic, refill torsemide and KCl    Medication Adjustments/Labs and Tests Ordered: Current medicines are reviewed at length with the  patient today.  Concerns regarding medicines are outlined above.  Medication changes, Labs and Tests ordered today are listed in the Patient Instructions below. Patient Instructions  Medication Instructions:   Your physician recommends that you continue on your current medications as directed. Please refer to the Current Medication list given to you today.    Testing/Procedures:  Your physician has requested that you have a LEFT lower extremity arterial duplex. This test is an ultrasound of the arteries in the legs. It looks at arterial blood flow in the legs and arms. Allow one hour for Lower Arterial scans. There are no restrictions or special instructions    Follow-Up:  Your physician wants you to follow-up in: [place] will receive a reminder letter in the mail two months in advance. If you don't receive a letter, please call our office to schedule the follow-up appointment.        If you need a refill on your cardiac medications before your next appointment, please call your pharmacy.      Signed, Ena Dawley, MD  04/19/2016 7:09 AM    [address], [place], [place]  [address] Phone: [phone]; Fax: [phone]

## 2016-04-18 NOTE — Patient Instructions (Signed)
Medication Instructions:   Your physician recommends that you continue on your current medications as directed. Please refer to the Current Medication list given to you today.    Testing/Procedures:  Your physician has requested that you have a LEFT lower extremity arterial duplex. This test is an ultrasound of the arteries in the legs. It looks at arterial blood flow in the legs and arms. Allow one hour for Lower Arterial scans. There are no restrictions or special instructions    Follow-Up:  Your physician wants you to follow-up in: [place] will receive a reminder letter in the mail two months in advance. If you don't receive a letter, please call our office to schedule the follow-up appointment.        If you need a refill on your cardiac medications before your next appointment, please call your pharmacy.

## 2016-04-24 ENCOUNTER — Other Ambulatory Visit: Payer: Self-pay | Admitting: Cardiology

## 2016-04-24 DIAGNOSIS — I739 Peripheral vascular disease, unspecified: Secondary | ICD-10-CM

## 2016-05-01 ENCOUNTER — Other Ambulatory Visit: Payer: Self-pay | Admitting: Family Medicine

## 2016-05-02 ENCOUNTER — Ambulatory Visit (HOSPITAL_COMMUNITY)
Admission: RE | Admit: 2016-05-02 | Discharge: 2016-05-02 | Disposition: A | Discharge status: Home / Self Care | Payer: Medicare Other | Source: Ambulatory Visit | Attending: Cardiovascular Disease | Admitting: Cardiovascular Disease

## 2016-05-02 DIAGNOSIS — I10 Essential (primary) hypertension: Secondary | ICD-10-CM | POA: Diagnosis not present

## 2016-05-02 DIAGNOSIS — I739 Peripheral vascular disease, unspecified: Secondary | ICD-10-CM | POA: Insufficient documentation

## 2016-05-02 DIAGNOSIS — E785 Hyperlipidemia, unspecified: Secondary | ICD-10-CM | POA: Diagnosis not present

## 2016-05-02 DIAGNOSIS — Z87891 Personal history of nicotine dependence: Secondary | ICD-10-CM | POA: Insufficient documentation

## 2016-05-02 DIAGNOSIS — Z86711 Personal history of pulmonary embolism: Secondary | ICD-10-CM

## 2016-05-02 DIAGNOSIS — E119 Type 2 diabetes mellitus without complications: Secondary | ICD-10-CM | POA: Diagnosis not present

## 2016-05-10 ENCOUNTER — Other Ambulatory Visit: Payer: Self-pay | Admitting: Cardiovascular Disease

## 2016-05-16 ENCOUNTER — Telehealth: Payer: Self-pay

## 2016-05-16 ENCOUNTER — Ambulatory Visit (INDEPENDENT_AMBULATORY_CARE_PROVIDER_SITE_OTHER): Payer: Medicare Other | Admitting: Pharmacist

## 2016-05-16 DIAGNOSIS — I272 Pulmonary hypertension, unspecified: Secondary | ICD-10-CM

## 2016-05-16 LAB — POCT INR: INR: 1.9

## 2016-05-16 NOTE — Telephone Encounter (Signed)
In reviewing chart unsure of what is needed.  Patient has not been seen since 2016.  Did have breast imaging in 2017.    Left message for patient to return call.

## 2016-06-17 ENCOUNTER — Other Ambulatory Visit: Payer: Self-pay | Admitting: Family Medicine

## 2016-06-20 ENCOUNTER — Telehealth: Payer: Self-pay

## 2016-06-20 DIAGNOSIS — I10 Essential (primary) hypertension: Secondary | ICD-10-CM

## 2016-06-20 DIAGNOSIS — E669 Obesity, unspecified: Secondary | ICD-10-CM

## 2016-06-20 DIAGNOSIS — E1169 Type 2 diabetes mellitus with other specified complication: Secondary | ICD-10-CM

## 2016-06-20 NOTE — Telephone Encounter (Signed)
Patient aware.

## 2016-06-27 ENCOUNTER — Ambulatory Visit (INDEPENDENT_AMBULATORY_CARE_PROVIDER_SITE_OTHER): Payer: Medicare Other | Admitting: Family Medicine

## 2016-06-27 ENCOUNTER — Other Ambulatory Visit: Payer: Self-pay | Admitting: Family Medicine

## 2016-06-27 ENCOUNTER — Encounter: Payer: Self-pay | Admitting: Family Medicine

## 2016-06-27 VITALS — BP 118/76 | HR 60 | Temp 98.7°F | Resp 18 | Ht 66.0 in | Wt 231.0 lb

## 2016-06-27 DIAGNOSIS — E559 Vitamin D deficiency, unspecified: Secondary | ICD-10-CM

## 2016-06-27 DIAGNOSIS — E1169 Type 2 diabetes mellitus with other specified complication: Secondary | ICD-10-CM

## 2016-06-27 DIAGNOSIS — K219 Gastro-esophageal reflux disease without esophagitis: Secondary | ICD-10-CM

## 2016-06-27 DIAGNOSIS — G8929 Other chronic pain: Secondary | ICD-10-CM

## 2016-06-27 DIAGNOSIS — E669 Obesity, unspecified: Secondary | ICD-10-CM

## 2016-06-27 DIAGNOSIS — M542 Cervicalgia: Secondary | ICD-10-CM

## 2016-06-27 DIAGNOSIS — E1121 Type 2 diabetes mellitus with diabetic nephropathy: Secondary | ICD-10-CM

## 2016-06-27 DIAGNOSIS — I10 Essential (primary) hypertension: Secondary | ICD-10-CM

## 2016-06-27 DIAGNOSIS — E785 Hyperlipidemia, unspecified: Secondary | ICD-10-CM | POA: Diagnosis not present

## 2016-06-27 DIAGNOSIS — Z2821 Immunization not carried out because of patient refusal: Secondary | ICD-10-CM

## 2016-06-27 DIAGNOSIS — M79672 Pain in left foot: Secondary | ICD-10-CM

## 2016-06-27 LAB — GLUCOSE, POCT (MANUAL RESULT ENTRY): POC GLUCOSE: 191 mg/dL — AB (ref 70–99)

## 2016-06-27 MED ORDER — CYCLOBENZAPRINE HCL 10 MG PO TABS: 10.0000 mg | ORAL_TABLET | Freq: Every day | ORAL | 3 refills | Status: DC

## 2016-06-27 MED ORDER — DESIPRAMINE HCL 25 MG PO TABS: 25.0000 mg | ORAL_TABLET | Freq: Every day | ORAL | 3 refills | Status: DC

## 2016-06-27 NOTE — Patient Instructions (Addendum)
Initial wellness visit in 6 weeks, ca;ll if you need me sooner mD follow up in 3 months Labs today, will call with results tomorrow , then determine medication and testing needs You are being restarted on flexeril and desipramine for neck and mid back pain  You are referred to podiatry for left foot pain  Thanks for choosing [facility], we consider it a privelige to serve you.

## 2016-06-28 ENCOUNTER — Telehealth: Payer: Self-pay | Admitting: Family Medicine

## 2016-06-28 DIAGNOSIS — M79672 Pain in left foot: Secondary | ICD-10-CM | POA: Insufficient documentation

## 2016-06-28 LAB — LIPID PANEL
CHOL/HDL RATIO: 4 ratio (ref <5.0)
CHOLESTEROL: 182 mg/dL (ref <200)
HDL: 46 mg/dL — ABNORMAL LOW (ref >50)
LDL Cholesterol: 81 mg/dL (ref <100)
Triglycerides: 275 mg/dL — ABNORMAL HIGH (ref <150)
VLDL: 55 mg/dL — AB (ref <30)

## 2016-06-28 LAB — VITAMIN D 25 HYDROXY (VIT D DEFICIENCY, FRACTURES): VIT D 25 HYDROXY: 42 ng/mL (ref 30–100)

## 2016-06-28 LAB — COMPLETE METABOLIC PANEL WITH GFR
ALBUMIN: 4.4 g/dL (ref 3.6–5.1)
ALK PHOS: 82 U/L (ref 33–130)
ALT: 14 U/L (ref 6–29)
AST: 17 U/L (ref 10–35)
BILIRUBIN TOTAL: 0.6 mg/dL (ref 0.2–1.2)
BUN: 24 mg/dL (ref 7–25)
CO2: 23 mmol/L (ref 20–31)
Calcium: 9.4 mg/dL (ref 8.6–10.4)
Chloride: 102 mmol/L (ref 98–110)
Creat: 1.55 mg/dL — ABNORMAL HIGH (ref 0.50–0.99)
GFR, EST AFRICAN AMERICAN: 39 mL/min — AB (ref >=60)
GFR, EST NON AFRICAN AMERICAN: 34 mL/min — AB (ref >=60)
Glucose, Bld: 115 mg/dL — ABNORMAL HIGH (ref 65–99)
POTASSIUM: 4.1 mmol/L (ref 3.5–5.3)
SODIUM: 137 mmol/L (ref 135–146)
Total Protein: 7.8 g/dL (ref 6.1–8.1)

## 2016-06-28 LAB — HEMOGLOBIN A1C
HEMOGLOBIN A1C: 7 % — AB (ref <5.7)
MEAN PLASMA GLUCOSE: 154 mg/dL

## 2016-06-28 LAB — TSH: TSH: 0.97 m[IU]/L

## 2016-06-28 NOTE — Assessment & Plan Note (Signed)
1 month h/o localized left foot pain on sole refer to podiatry

## 2016-06-28 NOTE — Telephone Encounter (Signed)
Message sent in another note

## 2016-06-28 NOTE — Assessment & Plan Note (Signed)
Controlled, no change in medication  

## 2016-06-28 NOTE — Assessment & Plan Note (Addendum)
Hyperlipidemia:Low fat diet discussed and encouraged. Updated lab needed   Lipid Panel  Lab Results  Component Value Date   CHOL 177 12/14/2015   HDL 43 (L) 12/14/2015   LDLCALC 93 12/14/2015   TRIG 205 (H) 12/14/2015   CHOLHDL 4.1 12/14/2015

## 2016-06-28 NOTE — Telephone Encounter (Signed)
Pls add lipid and hepatic to labs from yesterday  Pls advise pt and send in for oNCE daily   Testing  Pls send in the tradjenta for her diabetes management, let her know hBA1c result, can improve with diet alone but adding the new med  ?? pls ask

## 2016-06-28 NOTE — Progress Notes (Signed)
Stephanie Singleton     MRN: [id number]      DOB: [birth year]   HPI Stephanie Singleton is here for follow up and re-evaluation of chronic medical conditions, medication management and review of any available recent lab and radiology data.  Preventive health is updated, specifically  Cancer screening and Immunization.   C/o localized left foot pain, no trauma, worse on awakening C/o elevated blood sugars for past 1 month also polyuria and polydipsia and dry mouth C/o increased neck and back pain and spasm stopped gabapentin states of no benefit Refuses all immunization which is offered and needed  ROS Denies recent fever or chills. Denies sinus pressure, nasal congestion, ear pain or sore throat. Denies chest congestion, productive cough or wheezing. Denies chest pains, palpitations and leg swelling Denies abdominal pain, nausea, vomiting,diarrhea or constipation.   Denies dysuria, frequency, hesitancy or incontinence.  Denies headaches, seizures, numbness, or tingling. Denies depression, anxiety, has chronic  Insomnia worse with pain Denies skin break down or rash.   PE  BP 118/76 (BP Location: Right Arm, Patient Position: Sitting, Cuff Size: Large)   Pulse 60   Temp 98.7 F (37.1 C) (Oral)   Resp 18   Ht 5\' 6"  (1.676 m)   Wt 231 lb (104.8 kg)   SpO2 100%   BMI 37.28 kg/m   Patient alert and oriented and in no cardiopulmonary distress.  HEENT: No facial asymmetry, EOMI,   oropharynx pink and moist.  Neck decreased ROM with spasm, no JVD, no mass.  Chest: Clear to auscultation bilaterally.  CVS: S1, S2 no murmurs, no S3.Regular rate.  ABD: Soft non tender.   Ext: No edema  MS: decreased  ROM lumbar spine, adequate in shoulders, hips and knees.tender to palpation over sole of left foot just distal to heel  Skin: Intact, no ulcerations or rash noted.  Psych: Good eye contact, normal affect. Memory intact not anxious or depressed appearing.  CNS: CN 2-12 intact, power,   normal throughout.no focal deficits noted.   Assessment & Plan  Hypertension goal BP (blood pressure) < 130/80 Controlled, no change in medication DASH diet and commitment to daily physical activity for a minimum of 30 minutes discussed and encouraged, as a part of hypertension management. The importance of attaining a healthy weight is also discussed.  BP/Weight 06/27/2016 04/18/2016 11/22/2015 05/31/2015 03/28/2015 03/14/2015 123456  Systolic BP 123456 123456 [id number] 123456 0000000 [id number]  Diastolic BP 76 82 82 78 82 100 76  Wt. (Lbs) 231 230.2 225 216.12 - 208 -  BMI 37.28 37.16 36.33 34.9 - 33.59 -       Type 2 diabetes with nephropathy (HCC) Deteriorated. Start tradjenta Stephanie Singleton is reminded of the importance of commitment to daily physical activity for 30 minutes or more, as able and the need to limit carbohydrate intake to 30 to 60 grams per meal to help with blood sugar control.   The need to take medication as prescribed, test blood sugar as directed, and to call between visits if there is a concern that blood sugar is uncontrolled is also discussed.   Stephanie Singleton is reminded of the importance of daily foot exam, annual eye examination, and good blood sugar, blood pressure and cholesterol control.  Diabetic Labs Latest Ref Rng & Units 06/27/2016 12/14/2015 11/22/2015 05/31/2015 11/24/2014  HbA1c <5.7 % 7.0(H) 6.6(H) - 5.9(H) -  Microalbumin Not estab mg/dL - - 0.4 - 2.8(H)  Micro/Creat Ratio <30 mcg/mg creat - - 3 - 23.2  Chol 125 - 200 mg/dL - 177 - 168 -  HDL >=46 mg/dL - 43(L) - 47 -  Calc LDL <130 mg/dL - 93 - 90 -  Triglycerides <150 mg/dL - 205(H) - 156(H) -  Creatinine 0.50 - 0.99 mg/dL 1.55(H) 1.18(H) - 1.14(H) -   BP/Weight 06/27/2016 04/18/2016 11/22/2015 05/31/2015 03/28/2015 03/14/2015 123456  Systolic BP 123456 123456 A999333 123456 0000000 [id number]  Diastolic BP 76 82 82 78 82 100 76  Wt. (Lbs) 231 230.2 225 216.12 - 208 -  BMI 37.28 37.16 36.33 34.9 - 33.59 -   Foot/eye exam  completion dates Latest Ref Rng & Units 07/19/2015 11/23/2014  Eye Exam No Retinopathy Retinopathy(A) -  Foot Form Completion - - Done      Updated lab needed in 3 to 4 months   Hyperlipidemia LDL goal <100 Hyperlipidemia:Low fat diet discussed and encouraged. Updated lab needed   Lipid Panel  Lab Results  Component Value Date   CHOL 177 12/14/2015   HDL 43 (L) 12/14/2015   LDLCALC 93 12/14/2015   TRIG 205 (H) 12/14/2015   CHOLHDL 4.1 12/14/2015       GERD Controlled, no change in medication   Vaccination not carried out because of patient refusal Pt continues to refuse all recommended vaccines, re education done regarding benefit  Neck pain, chronic Deteriorated, new pain management with desipramine and flexeril

## 2016-06-28 NOTE — Assessment & Plan Note (Signed)
Deteriorated. Start tradjenta Ms. Mee is reminded of the importance of commitment to daily physical activity for 30 minutes or more, as able and the need to limit carbohydrate intake to 30 to 60 grams per meal to help with blood sugar control.   The need to take medication as prescribed, test blood sugar as directed, and to call between visits if there is a concern that blood sugar is uncontrolled is also discussed.   Ms. Oppliger is reminded of the importance of daily foot exam, annual eye examination, and good blood sugar, blood pressure and cholesterol control.  Diabetic Labs Latest Ref Rng & Units 06/27/2016 12/14/2015 11/22/2015 05/31/2015 11/24/2014  HbA1c <5.7 % 7.0(H) 6.6(H) - 5.9(H) -  Microalbumin Not estab mg/dL - - 0.4 - 2.8(H)  Micro/Creat Ratio <30 mcg/mg creat - - 3 - 23.2  Chol 125 - 200 mg/dL - 177 - 168 -  HDL >=46 mg/dL - 43(L) - 47 -  Calc LDL <130 mg/dL - 93 - 90 -  Triglycerides <150 mg/dL - 205(H) - 156(H) -  Creatinine 0.50 - 0.99 mg/dL 1.55(H) 1.18(H) - 1.14(H) -   BP/Weight 06/27/2016 04/18/2016 11/22/2015 05/31/2015 03/28/2015 03/14/2015 123456  Systolic BP 123456 123456 A999333 123456 0000000 [id number]  Diastolic BP 76 82 82 78 82 100 76  Wt. (Lbs) 231 230.2 225 216.12 - 208 -  BMI 37.28 37.16 36.33 34.9 - 33.59 -   Foot/eye exam completion dates Latest Ref Rng & Units 07/19/2015 11/23/2014  Eye Exam No Retinopathy Retinopathy(A) -  Foot Form Completion - - Done      Updated lab needed in 3 to 4 months

## 2016-06-28 NOTE — Assessment & Plan Note (Signed)
Controlled, no change in medication DASH diet and commitment to daily physical activity for a minimum of 30 minutes discussed and encouraged, as a part of hypertension management. The importance of attaining a healthy weight is also discussed.  BP/Weight 06/27/2016 04/18/2016 11/22/2015 05/31/2015 03/28/2015 03/14/2015 123456  Systolic BP 123456 123456 [id number] 123456 0000000 [id number]  Diastolic BP 76 82 82 78 82 100 76  Wt. (Lbs) 231 230.2 225 216.12 - 208 -  BMI 37.28 37.16 36.33 34.9 - 33.59 -

## 2016-06-28 NOTE — Assessment & Plan Note (Signed)
Pt continues to refuse all recommended vaccines, re education done regarding benefit

## 2016-06-28 NOTE — Assessment & Plan Note (Signed)
Deteriorated, new pain management with desipramine and flexeril

## 2016-07-03 ENCOUNTER — Other Ambulatory Visit: Payer: Self-pay

## 2016-07-03 MED ORDER — LINAGLIPTIN 5 MG PO TABS: 5.0000 mg | ORAL_TABLET | Freq: Every day | ORAL | 4 refills | Status: DC

## 2016-07-03 NOTE — Telephone Encounter (Signed)
Patient aware and med sent  

## 2016-07-13 ENCOUNTER — Other Ambulatory Visit: Payer: Self-pay | Admitting: Family Medicine

## 2016-07-18 ENCOUNTER — Ambulatory Visit: Payer: Medicare Other | Admitting: Podiatry

## 2016-07-26 ENCOUNTER — Ambulatory Visit (INDEPENDENT_AMBULATORY_CARE_PROVIDER_SITE_OTHER): Payer: Medicare Other | Admitting: Podiatry

## 2016-07-26 ENCOUNTER — Ambulatory Visit (INDEPENDENT_AMBULATORY_CARE_PROVIDER_SITE_OTHER): Payer: Medicare Other

## 2016-07-26 ENCOUNTER — Encounter: Payer: Self-pay | Admitting: Podiatry

## 2016-07-26 VITALS — BP 133/84 | HR 92 | Resp 18

## 2016-07-26 DIAGNOSIS — M779 Enthesopathy, unspecified: Secondary | ICD-10-CM | POA: Diagnosis not present

## 2016-07-26 DIAGNOSIS — M79674 Pain in right toe(s): Secondary | ICD-10-CM | POA: Diagnosis not present

## 2016-07-26 DIAGNOSIS — M79675 Pain in left toe(s): Secondary | ICD-10-CM | POA: Diagnosis not present

## 2016-07-26 DIAGNOSIS — M722 Plantar fascial fibromatosis: Secondary | ICD-10-CM | POA: Diagnosis not present

## 2016-07-26 DIAGNOSIS — B351 Tinea unguium: Secondary | ICD-10-CM

## 2016-07-26 DIAGNOSIS — E1149 Type 2 diabetes mellitus with other diabetic neurological complication: Secondary | ICD-10-CM

## 2016-07-26 NOTE — Progress Notes (Signed)
   Subjective:    Patient ID: Stephanie Singleton, female    DOB: 1946-03-06, 72 y.o.   MRN: [id number]  HPI 71 year old female presents the office today for concerns of left foot pain which is been ongoing for several months. She said that she has pain in the bottom of her heel she describes as a throbbing sensation. She says the pain is worse than when she first gets up. She denies any recent injury or trauma.  She also states that her nails are thick, painful, elongated she cannot trim them herself. They get irritated in shoes.  She also had a cervical fusion in 2014.Marland Kitchen She states that after she started in some numbness to her left leg.   She is diabetic and she states her blood sugars are high stress between 150 to 160. She denies any claudication symptoms.  No other complaints today. Review of Systems  All other systems reviewed and are negative.      Objective:   Physical Exam General: AAO x3, NAD  Dermatological: Nails are hypertrophic, dystrophic, brittle, discolored, elongated 10. No surrounding redness or drainage. Tenderness nails 1-5 bilaterally. No open lesions or pre-ulcerative lesions are identified today.  Vascular: Dorsalis Pedis artery and Posterior Tibial artery pedal pulses are 2/4 bilateral with immedate capillary fill time.  There is no pain with calf compression, swelling, warmth, erythema. Bilateral legs and feet are of normal temperature gradient.  Neruologic: Sensation appears to be intact with Derrel Nip monofilament.  Musculoskeletal: There is mild tenderness to palpation along the plantar medial tubercle of the calcaneus at the insertion of plantar fascia on the left foot. There is no pain along the course of the plantar fascia within the arch of the foot. Plantar fascia appears to be intact. There is no pain with lateral compression of the calcaneus or pain with vibratory sensation. There is no pain along the course or insertion of the achilles tendon. No  other areas of tenderness to bilateral lower extremities. MMT 5/5, ROM WNL.   Gait: Unassisted, Nonantalgic.      Assessment & Plan:  71 year old female left heel pain, plantar fasciitis; neuritis; symptomatic onychomycosis -Treatment options discussed including all alternatives, risks, and complications -Etiology of symptoms were discussed -X-rays were obtained and reviewed with the patient. No evidence of acute fracture identified. -Discussed steroid injection for the healed state. Plantar fascial brace was dispensed. Discussed stretching, icing exercises daily as well supportive shoe gear and orthotics. -Nails are sharply debrided 10 without complications or bleeding today. -Discussed that some of her symptoms sound be more neurological origin. Discussed nerve conduction testing but she declines this. She says that she'll follow-up with her neurosurgeon and discuss. -Follow-up as scheduled or sooner if needed. Call any questions or concerns meantime.  Celesta Gentile, DPM

## 2016-07-26 NOTE — Patient Instructions (Signed)

## 2016-07-27 ENCOUNTER — Other Ambulatory Visit: Payer: Self-pay | Admitting: Family Medicine

## 2016-08-04 ENCOUNTER — Other Ambulatory Visit: Payer: Self-pay | Admitting: Family Medicine

## 2016-08-07 ENCOUNTER — Ambulatory Visit (INDEPENDENT_AMBULATORY_CARE_PROVIDER_SITE_OTHER): Payer: Medicare Other

## 2016-08-07 VITALS — BP 118/82 | HR 94 | Temp 98.4°F | Ht 66.0 in | Wt 223.1 lb

## 2016-08-07 DIAGNOSIS — I10 Essential (primary) hypertension: Secondary | ICD-10-CM

## 2016-08-07 DIAGNOSIS — E785 Hyperlipidemia, unspecified: Secondary | ICD-10-CM

## 2016-08-07 DIAGNOSIS — E1121 Type 2 diabetes mellitus with diabetic nephropathy: Secondary | ICD-10-CM | POA: Diagnosis not present

## 2016-08-07 DIAGNOSIS — Z0000 Encounter for general adult medical examination without abnormal findings: Secondary | ICD-10-CM

## 2016-08-07 NOTE — Progress Notes (Signed)
Subjective:   Stephanie Singleton is a 71 y.o. female who presents for Medicare Annual (Subsequent) preventive examination.  Review of Systems:  Cardiac Risk Factors include: advanced age (>21men, >67 women);diabetes mellitus;dyslipidemia;hypertension;obesity (BMI >30kg/m2);sedentary lifestyle     Objective:     Vitals: BP 118/82   Pulse 94   Temp 98.4 F (36.9 C) (Oral)   Ht 5\' 6"  (1.676 m)   Wt 223 lb 2.1 oz (101.2 kg)   SpO2 94%   BMI 36.01 kg/m   Body mass index is 36.01 kg/m.   Tobacco History  Smoking Status  . Former Smoker  . Packs/day: 0.25  . Years: 20.00  . Quit date: 08/07/1996  Smokeless Tobacco  . Never Used     Counseling given: No   Past Medical History:  Diagnosis Date  . Diabetes mellitus DX X 1 YR   USE METFORMIN ACCORDING TO CBG RESULT  . Embolism - blood clot 2004   Pulmonary .on coumadin  . GERD (gastroesophageal reflux disease)    ON PREVACID  . HTN (hypertension)    EKG 10/23 IN EPIC  . Hyperlipidemia   . Hypertension    CARDIOLOGY VISIT.CARDIAC CLEARANCE.LOVENOX INSTRUCTIONS.  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 OSA (obstructive sleep apnea) DX 8 YRS AGO.DOES NOT WEAR MASK  . Tumor of soft tissue of neck    Past Surgical History:  Procedure Laterality Date  . CERVICAL FUSION     Family History  Problem Relation Age of Onset  . Heart disease Mother   . Hypertension Mother   . Stroke Mother   . Diabetes Father   . Peripheral vascular disease Father   . Peripheral vascular disease Brother   . Heart disease Brother   . Bladder Cancer Sister 76  . Diabetes Daughter     3 of the 5 daughters  . Diabetes Son     3 of the sons  . Breast cancer Sister 57   History  Sexual Activity  . Sexual activity: Yes    Outpatient Encounter Prescriptions as of 08/07/2016  Medication Sig  . ACCU-CHEK AVIVA PLUS test strip TEST ONCE DAILY  . cyclobenzaprine (FLEXERIL) 10  MG tablet Take 1 tablet (10 mg total) by mouth at bedtime.  Marland Kitchen desipramine (NORPRAMIN) 25 MG tablet Take 1 tablet (25 mg total) by mouth at bedtime. (Patient taking differently: Take 25 mg by mouth daily as needed. )  . hydrOXYzine (ATARAX/VISTARIL) 50 MG tablet TAKE 1 TABLET (50 MG TOTAL) BY MOUTH AT BEDTIME.  Marland Kitchen linagliptin (TRADJENTA) 5 MG TABS tablet Take 1 tablet (5 mg total) by mouth daily.  Marland Kitchen losartan (COZAAR) 100 MG tablet TAKE 1 TABLET (100 MG TOTAL) BY MOUTH DAILY.  . pantoprazole (PROTONIX) 40 MG tablet TAKE 1 TABLET (40 MG TOTAL) BY MOUTH DAILY.  Marland Kitchen potassium chloride SA (K-DUR,KLOR-CON) 20 MEQ tablet Take 1 tablet (20 mEq total) by mouth 2 (two) times a week. *Take only on the days that Demadex (TORSEMIDE) is taken*  . pravastatin (PRAVACHOL) 20 MG tablet TAKE 1 TABLET BY MOUTH DAILY  . torsemide (DEMADEX) 20 MG tablet TAKE 1 TABLET 2 TIMES A WEEK AS NEEDED FOR SWELLING  . triamterene-hydrochlorothiazide (MAXZIDE-25) 37.5-25 MG tablet TAKE 1 TABLET BY MOUTH DAILY.  Marland Kitchen Vitamin D, Ergocalciferol, (DRISDOL) 50000 units CAPS capsule Take 1 capsule by mouth as needed.  . warfarin (COUMADIN) 5 MG tablet TAKE 1/2 TABLET DAILY OR  AS DIRECTED  . [DISCONTINUED] gabapentin (NEURONTIN) 300 MG capsule Take 1 capsule (300 mg total) by mouth 3 (three) times daily. (Patient not taking: Reported on 06/27/2016)   No facility-administered encounter medications on file as of 08/07/2016.     Activities of Daily Living In your present state of health, do you have any difficulty performing the following activities: 08/07/2016 06/27/2016  Hearing? N N  Vision? N N  Difficulty concentrating or making decisions? N N  Walking or climbing stairs? N N  Dressing or bathing? N N  Doing errands, shopping? N N  Preparing Food and eating ? N -  Using the Toilet? N -  In the past six months, have you accidently leaked urine? N -  Do you have problems with loss of bowel control? N -  Managing your Medications? N -    Managing your Finances? N -  Housekeeping or managing your Housekeeping? N -  Some recent data might be hidden    Patient Care Team: Fayrene Helper, MD as PCP - General Yehuda Savannah, MD (Cardiology) Trula Slade, DPM as Consulting Physician (Podiatry) Ashok Pall, MD as Consulting Physician (Neurosurgery)    Assessment:    Exercise Activities and Dietary recommendations Current Exercise Habits: The patient does not participate in regular exercise at present, Exercise limited by: None identified  Goals    . eat 3 meals a day      Fall Risk Fall Risk  08/07/2016 06/27/2016 12/21/2014 10/23/2013  Falls in the past year? No No No No   Depression Screen PHQ 2/9 Scores 08/07/2016 06/27/2016 12/21/2014 10/23/2013  PHQ - 2 Score 0 0 0 0     Cognitive Function   Normal 6CIT Screen 08/07/2016  What Year? 0 points  What month? 0 points  What time? 0 points  Count back from 20 0 points  Months in reverse 0 points  Repeat phrase 0 points  Total Score 0    Immunization History  Administered Date(s) Administered  . Td 01/03/2005   Screening Tests Health Maintenance  Topic Date Due  . OPHTHALMOLOGY EXAM  07/18/2016  . PNA vac Low Risk Adult (1 of 2 - PCV13) 02/06/2017 (Originally 01/11/2013)  . INFLUENZA VACCINE  05/15/2017 (Originally 01/31/2016)  . TETANUS/TDAP  12/18/2017 (Originally 01/04/2015)  . ZOSTAVAX  07/23/2019 (Originally 01/12/2008)  . HEMOGLOBIN A1C  12/26/2016  . FOOT EXAM  06/28/2017  . MAMMOGRAM  11/29/2017  . COLONOSCOPY  12/08/2023  . DEXA SCAN  Completed  . Hepatitis C Screening  Completed      Plan:    I have personally reviewed and addressed the Medicare Annual Wellness questionnaire and have noted the following in the patient's chart:  A. Medical and social history B. Use of alcohol, tobacco or illicit drugs  C. Current medications and supplements D. Functional ability and status E.  Nutritional status F.  Physical activity G. Advance  directives H. List of other physicians I.  Hospitalizations, surgeries, and ER visits in previous 12 months J.  [place] to include cognitive, depression, and falls L. Referrals and appointments -  Follow up in 3 months with Dr. Moshe Cipro for your annual physical exam  In addition, I have reviewed and discussed with patient certain preventive protocols, quality metrics, and best practice recommendations. A written personalized care plan for preventive services as well as general preventive health recommendations were provided to patient.  Signed,   Stormy Fabian, LPN Lead Nurse Health Advisor

## 2016-08-07 NOTE — Patient Instructions (Addendum)
Health maintenance: Due for diabetic eye exam, repeat bone density exam, flu, pneumonia, Tdap, and shingles vaccines. Patient declines all.   Abnormal screenings: None   Patient concerns: Pain in her left shoulder, recommend follow up with Dr. Moshe Cipro   Nurse concerns: Recommend follow up diabetic eye exam. Your last eye exam showed diabetic retinopathy. It is very important to follow up with your eye Dr. Freda Munro.    Next PCP appt: Follow up in 3 months with Dr. Moshe Cipro for your annual physical exam. Lab work has been ordered for you, please have this done a week prior to your appointment with Dr. Moshe Cipro. Follow up in 1 year for your annual wellness visit.     Preventive Care 69 Years and Older, Female Preventive care refers to lifestyle choices and visits with your health care provider that can promote health and wellness. What does preventive care include?  A yearly physical exam. This is also called an annual well check.  Dental exams once or twice a year.  Routine eye exams. Ask your health care provider how often you should have your eyes checked.  Personal lifestyle choices, including:  Daily care of your teeth and gums.  Regular physical activity.  Eating a healthy diet.  Avoiding tobacco and drug use.  Limiting alcohol use.  Practicing safe sex.  Taking low-dose aspirin every day.  Taking vitamin and mineral supplements as recommended by your health care provider. What happens during an annual well check? The services and screenings done by your health care provider during your annual well check will depend on your age, overall health, lifestyle risk factors, and family history of disease. Counseling  Your health care provider may ask you questions about your:  Alcohol use.  Tobacco use.  Drug use.  Emotional well-being.  Home and relationship well-being.  Sexual activity.  Eating habits.  History of falls.  Memory and ability to understand  (cognition).  Work and work Statistician.  Reproductive health. Screening  You may have the following tests or measurements:  Height, weight, and BMI.  Blood pressure.  Lipid and cholesterol levels. These may be checked every 5 years, or more frequently if you are over 59 years old.  Skin check.  Lung cancer screening. You may have this screening every year starting at age 52 if you have a 30-pack-year history of smoking and currently smoke or have quit within the past 15 years.  Fecal occult blood test (FOBT) of the stool. You may have this test every year starting at age 70.  Flexible sigmoidoscopy or colonoscopy. You may have a sigmoidoscopy every 5 years or a colonoscopy every 10 years starting at age 39.  Hepatitis C blood test.  Diabetes screening. This is done by checking your blood sugar (glucose) after you have not eaten for a while (fasting). You may have this done every 1-3 years.  Bone density scan. This is done to screen for osteoporosis. You may have this done starting at age 68.  Mammogram. This may be done every 1-2 years. Talk to your health care provider about how often you should have regular mammograms. Talk with your health care provider about your test results, treatment options, and if necessary, the need for more tests. Vaccines  Your health care provider may recommend certain vaccines, such as:  Influenza vaccine. This is recommended every year.  Tetanus, diphtheria, and acellular pertussis (Tdap, Td) vaccine. You may need a Td booster every 10 years.  Varicella vaccine. You may need this  if you have not been vaccinated.  Zoster vaccine. You may need this after age 40.  Pneumococcal 13-valent conjugate (PCV13) vaccine. One dose is recommended after age 20.  Pneumococcal polysaccharide (PPSV23) vaccine. One dose is recommended after age 92.  Talk to your health care provider about which screenings and vaccines you need and how often you need  them. This information is not intended to replace advice given to you by your health care provider. Make sure you discuss any questions you have with your health care provider. Document Released: 07/15/2015 Document Revised: 03/07/2016 Document Reviewed: 04/19/2015 Elsevier Interactive Patient Education  2017 [employer].

## 2016-08-14 ENCOUNTER — Telehealth: Payer: Self-pay

## 2016-08-14 DIAGNOSIS — E1121 Type 2 diabetes mellitus with diabetic nephropathy: Secondary | ICD-10-CM

## 2016-08-15 ENCOUNTER — Ambulatory Visit (INDEPENDENT_AMBULATORY_CARE_PROVIDER_SITE_OTHER): Payer: Medicare Other | Admitting: *Deleted

## 2016-08-15 DIAGNOSIS — I272 Pulmonary hypertension, unspecified: Secondary | ICD-10-CM | POA: Diagnosis not present

## 2016-08-15 LAB — POCT INR: INR: 2

## 2016-08-15 NOTE — Telephone Encounter (Signed)
pls advise that with her kidney function as it ids and with HBA1C of 7.0, she may follow diabetic diet and commit to daily exercise and keep blood sugar within an acceptable range for diabetic which is 7.0 to 8.0, thogh for her I was aiming for 6.5 NEEDS diabetic education for carb counting , so pls refer if she has not been yet. Test fBG daily, if under 130 she is "doing well"  PLs ensure she has rept hBA1C and eGFR non fast 12 weeks from most recent test also thanks

## 2016-08-16 NOTE — Addendum Note (Signed)
Addended by: Denman George B on: 08/16/2016 03:05 PM   Modules accepted: Orders

## 2016-08-16 NOTE — Telephone Encounter (Signed)
Called and left message for patient to return call.  Repeat labs ordered and mailed to patient.

## 2016-08-17 NOTE — Telephone Encounter (Signed)
Called and left message for patient to return call.  

## 2016-08-21 ENCOUNTER — Other Ambulatory Visit: Payer: Self-pay

## 2016-08-21 MED ORDER — GLUCOSE BLOOD VI STRP: ORAL_STRIP | 3 refills | Status: DC

## 2016-08-28 ENCOUNTER — Other Ambulatory Visit: Payer: Self-pay | Admitting: Family Medicine

## 2016-08-30 ENCOUNTER — Ambulatory Visit: Payer: Medicare Other | Admitting: Podiatry

## 2016-09-04 ENCOUNTER — Encounter: Payer: Self-pay | Admitting: Family Medicine

## 2016-09-04 ENCOUNTER — Ambulatory Visit (INDEPENDENT_AMBULATORY_CARE_PROVIDER_SITE_OTHER): Payer: Medicare Other | Admitting: Family Medicine

## 2016-09-04 VITALS — BP 114/78 | HR 66 | Resp 15 | Ht 66.0 in | Wt 226.0 lb

## 2016-09-04 DIAGNOSIS — E1121 Type 2 diabetes mellitus with diabetic nephropathy: Secondary | ICD-10-CM

## 2016-09-04 DIAGNOSIS — J301 Allergic rhinitis due to pollen: Secondary | ICD-10-CM | POA: Diagnosis not present

## 2016-09-04 DIAGNOSIS — G8929 Other chronic pain: Secondary | ICD-10-CM

## 2016-09-04 DIAGNOSIS — M25512 Pain in left shoulder: Secondary | ICD-10-CM | POA: Diagnosis not present

## 2016-09-04 DIAGNOSIS — E669 Obesity, unspecified: Secondary | ICD-10-CM | POA: Diagnosis not present

## 2016-09-04 DIAGNOSIS — J302 Other seasonal allergic rhinitis: Secondary | ICD-10-CM

## 2016-09-04 DIAGNOSIS — I10 Essential (primary) hypertension: Secondary | ICD-10-CM | POA: Diagnosis not present

## 2016-09-04 DIAGNOSIS — J309 Allergic rhinitis, unspecified: Secondary | ICD-10-CM | POA: Insufficient documentation

## 2016-09-04 DIAGNOSIS — Z2821 Immunization not carried out because of patient refusal: Secondary | ICD-10-CM | POA: Diagnosis not present

## 2016-09-04 DIAGNOSIS — E1169 Type 2 diabetes mellitus with other specified complication: Secondary | ICD-10-CM | POA: Diagnosis not present

## 2016-09-04 MED ORDER — LORATADINE 10 MG PO TABS: 10.0000 mg | ORAL_TABLET | Freq: Every day | ORAL | 4 refills | Status: DC

## 2016-09-04 NOTE — Patient Instructions (Signed)
F/u in May as before, call if you need me sooner  You are referred to orthopedics and eye specialist   We will provide new lab sheet  Thank you  for choosing [facility]. We consider it a privelige to serve you.  Delivering excellent health care in a caring and  compassionate way is our goal.  Partnering with you,  so that together we can achieve this goal is our strategy.   It is important that you exercise regularly at least 30 minutes 5 times a week. If you develop chest pain, have severe difficulty breathing, or feel very tired, stop exercising immediately and seek medical attention    Please work on good  health habits so that your health will improve. 1. Commitment to daily physical activity for 30 to 60  minutes, if you are able to do this.  2. Commitment to wise food choices. Aim for half of your  food intake to be vegetable and fruit, one quarter starchy foods, and one quarter protein. Try to eat on a regular schedule  3 meals per day, snacking between meals should be limited to vegetables or fruits or small portions of nuts. 64 ounces of water per day is generally recommended, unless you have specific health conditions, like heart failure or kidney failure where you will need to limit fluid intake.  3. Commitment to sufficient and a  good quality of physical and mental rest daily, generally between 6 to 8 hours per day.  WITH PERSISTANCE AND PERSEVERANCE, THE IMPOSSIBLE , BECOMES THE NORM!

## 2016-09-06 NOTE — Assessment & Plan Note (Signed)
Controlled, no change in medication DASH diet and commitment to daily physical activity for a minimum of 30 minutes discussed and encouraged, as a part of hypertension management. The importance of attaining a healthy weight is also discussed.  BP/Weight 09/04/2016 08/07/2016 07/26/2016 06/27/2016 04/18/2016 11/22/2015 88/82/8003  Systolic BP 491 791 505 697 948 016 553  Diastolic BP 78 82 84 76 82 82 78  Wt. (Lbs) 226 223.13 - 231 230.2 225 216.12  BMI 36.48 36.01 - 37.28 37.16 36.33 34.9

## 2016-09-06 NOTE — Assessment & Plan Note (Signed)
2 Month h/o progressive pain and reduced mobility, no trauma, refer ortho for eval and management, likely tendinopathy

## 2016-09-06 NOTE — Assessment & Plan Note (Signed)
Deteriorated. Patient re-educated about  the importance of commitment to a  minimum of 150 minutes of exercise per week.  The importance of healthy food choices with portion control discussed. Encouraged to start a food diary, count calories and to consider  joining a support group. Sample diet sheets offered. Goals set by the patient for the next several months.   Weight /BMI 09/04/2016 08/07/2016 06/27/2016  WEIGHT 226 lb 223 lb 2.1 oz 231 lb  HEIGHT 5\' 6"  5\' 6"  5\' 6"   BMI 36.48 kg/m2 36.01 kg/m2 37.28 kg/m2

## 2016-09-06 NOTE — Progress Notes (Signed)
Stephanie Singleton     MRN: [id number]      DOB: 1946-02-29   HPI Stephanie Singleton is here with a 2 month h/o reduced right shoulder movement and pain, no h/o trauma, and also  for follow up and re-evaluation of chronic medical conditions, medication management and review of any available recent lab and radiology data.  Preventive health is updated, specifically  Cancer screening and Immunization.  Still refuses immunization, needs mammogram Questions or concerns regarding consultations or procedures which the PT has had in the interim are  addressed. The PT denies any adverse reactions to current medications since the last visit.  Increased nasal drainage , clear and head congestion x 3 weeks Denies polyuria, polydipsia, blurred vision , or hypoglycemic episodes.   ROS Denies recent fever or chills. Denies  ear pain or sore throat. Denies chest congestion, productive cough or wheezing. Denies chest pains, palpitations and leg swelling Denies abdominal pain, nausea, vomiting,diarrhea or constipation.   Denies dysuria, frequency, hesitancy or incontinence.  Denies headaches, seizures, numbness, or tingling. Denies depression, anxiety or insomnia. Denies skin break down or rash.   PE  BP 114/78   Pulse 66   Resp 15   Ht 5\' 6"  (1.676 m)   Wt 226 lb (102.5 kg)   SpO2 100%   BMI 36.48 kg/m   Patient alert and oriented and in no cardiopulmonary distress.  HEENT: No facial asymmetry, EOMI,   oropharynx pink and moist.  Neck supple no JVD, no mass.TM clear, no sinus tenderness  Chest: Clear to auscultation bilaterally.  CVS: S1, S2 no murmurs, no S3.Regular rate.  ABD: Soft non tender.   Ext: No edema  MS: Adequate though reduced  ROM lumbar spine,,normal in hips and knees.Decreased ROM left shoulder  Skin: Intact, no ulcerations or rash noted.  Psych: Good eye contact, normal affect. Memory intact not anxious or depressed appearing.  CNS: CN 2-12 intact, power,  normal  throughout.no focal deficits noted.   Assessment & Plan  Shoulder pain, left 2 Month h/o progressive pain and reduced mobility, no trauma, refer ortho for eval and management, likely tendinopathy  Type 2 diabetes with nephropathy ([place]) Reports improved  Updated lab needed at/ before next visit. Needs eye exam and is referred   Vaccination not carried out because of patient refusal Re educated , refuses still  Obesity (BMI 30.0-34.9) Deteriorated. Patient re-educated about  the importance of commitment to a  minimum of 150 minutes of exercise per week.  The importance of healthy food choices with portion control discussed. Encouraged to start a food diary, count calories and to consider  joining a support group. Sample diet sheets offered. Goals set by the patient for the next several months.   Weight /BMI 09/04/2016 08/07/2016 06/27/2016  WEIGHT 226 lb 223 lb 2.1 oz 231 lb  HEIGHT 5\' 6"  5\' 6"  5\' 6"   BMI 36.48 kg/m2 36.01 kg/m2 37.28 kg/m2      Hypertension goal BP (blood pressure) < 130/80 Controlled, no change in medication DASH diet and commitment to daily physical activity for a minimum of 30 minutes discussed and encouraged, as a part of hypertension management. The importance of attaining a healthy weight is also discussed.  BP/Weight 09/04/2016 08/07/2016 07/26/2016 06/27/2016 04/18/2016 11/22/2015 97/98/9211  Systolic BP 941 740 814 481 856 314 970  Diastolic BP 78 82 84 76 82 82 78  Wt. (Lbs) 226 223.13 - 231 230.2 225 216.12  BMI 36.48 36.01 - 37.28 37.16 36.33 34.9  Allergic rhinitis Increased symptoms I npast 3 weeks, needs to commit to daily medication, re educated

## 2016-09-06 NOTE — Assessment & Plan Note (Signed)
Re educated , refuses still

## 2016-09-06 NOTE — Assessment & Plan Note (Signed)
Increased symptoms I npast 3 weeks, needs to commit to daily medication, re educated

## 2016-09-06 NOTE — Assessment & Plan Note (Addendum)
Reports improved  Updated lab needed at/ before next visit. Needs eye exam and is referred

## 2016-09-12 ENCOUNTER — Ambulatory Visit (INDEPENDENT_AMBULATORY_CARE_PROVIDER_SITE_OTHER): Payer: Medicare Other | Admitting: *Deleted

## 2016-09-12 DIAGNOSIS — I272 Pulmonary hypertension, unspecified: Secondary | ICD-10-CM

## 2016-09-12 LAB — POCT INR: INR: 4.5

## 2016-09-21 DIAGNOSIS — M19012 Primary osteoarthritis, left shoulder: Secondary | ICD-10-CM | POA: Diagnosis not present

## 2016-09-21 DIAGNOSIS — M7542 Impingement syndrome of left shoulder: Secondary | ICD-10-CM | POA: Diagnosis not present

## 2016-09-26 ENCOUNTER — Ambulatory Visit (INDEPENDENT_AMBULATORY_CARE_PROVIDER_SITE_OTHER): Payer: Medicare Other | Admitting: *Deleted

## 2016-09-26 DIAGNOSIS — I272 Pulmonary hypertension, unspecified: Secondary | ICD-10-CM | POA: Diagnosis not present

## 2016-09-26 LAB — POCT INR: INR: 2.2

## 2016-09-26 MED ORDER — WARFARIN SODIUM 2.5 MG PO TABS
ORAL_TABLET | ORAL | 1 refills | Status: DC
Start: 2016-09-26 — End: 2016-10-24

## 2016-10-03 ENCOUNTER — Ambulatory Visit: Payer: Medicare Other | Admitting: Family Medicine

## 2016-10-10 ENCOUNTER — Other Ambulatory Visit: Payer: Self-pay | Admitting: Family Medicine

## 2016-10-24 ENCOUNTER — Other Ambulatory Visit: Payer: Self-pay | Admitting: *Deleted

## 2016-10-24 NOTE — Telephone Encounter (Signed)
Pharmacy requests ninety day. 

## 2016-10-29 ENCOUNTER — Other Ambulatory Visit: Payer: Self-pay

## 2016-10-29 MED ORDER — DESIPRAMINE HCL 25 MG PO TABS: 25.0000 mg | ORAL_TABLET | Freq: Every day | ORAL | 3 refills | Status: DC

## 2016-10-29 MED ORDER — LINAGLIPTIN 5 MG PO TABS: 5.0000 mg | ORAL_TABLET | Freq: Every day | ORAL | 1 refills | Status: DC

## 2016-10-29 MED ORDER — HYDROXYZINE HCL 50 MG PO TABS: ORAL_TABLET | ORAL | 1 refills | Status: DC

## 2016-10-31 ENCOUNTER — Other Ambulatory Visit: Payer: Self-pay | Admitting: Family Medicine

## 2016-10-31 ENCOUNTER — Ambulatory Visit (INDEPENDENT_AMBULATORY_CARE_PROVIDER_SITE_OTHER): Payer: Medicare Other | Admitting: Pharmacist

## 2016-10-31 DIAGNOSIS — I272 Pulmonary hypertension, unspecified: Secondary | ICD-10-CM

## 2016-10-31 DIAGNOSIS — E1169 Type 2 diabetes mellitus with other specified complication: Secondary | ICD-10-CM | POA: Diagnosis not present

## 2016-10-31 LAB — POCT INR: INR: 5.1

## 2016-10-31 MED ORDER — WARFARIN SODIUM 2.5 MG PO TABS: ORAL_TABLET | ORAL | 1 refills | Status: DC

## 2016-11-01 ENCOUNTER — Other Ambulatory Visit: Payer: Self-pay

## 2016-11-01 LAB — CMP14+EGFR
ALBUMIN: 4.3 g/dL (ref 3.6–4.8)
ALK PHOS: 91 IU/L (ref 39–117)
ALT: 17 IU/L (ref 0–32)
AST: 21 IU/L (ref 0–40)
Albumin/Globulin Ratio: 1.3 (ref 1.2–2.2)
BILIRUBIN TOTAL: 0.5 mg/dL (ref 0.0–1.2)
BUN / CREAT RATIO: 16 (ref 12–28)
BUN: 25 mg/dL (ref 8–27)
CO2: 20 mmol/L (ref 18–29)
CREATININE: 1.54 mg/dL — AB (ref 0.57–1.00)
Calcium: 9.5 mg/dL (ref 8.7–10.3)
Chloride: 99 mmol/L (ref 96–106)
GFR calc Af Amer: 40 mL/min/{1.73_m2} — ABNORMAL LOW (ref >59)
GFR calc non Af Amer: 34 mL/min/{1.73_m2} — ABNORMAL LOW (ref >59)
GLUCOSE: 125 mg/dL — AB (ref 65–99)
Globulin, Total: 3.2 g/dL (ref 1.5–4.5)
Potassium: 4.5 mmol/L (ref 3.5–5.2)
SODIUM: 137 mmol/L (ref 134–144)
Total Protein: 7.5 g/dL (ref 6.0–8.5)

## 2016-11-01 LAB — HEMOGLOBIN A1C
ESTIMATED AVERAGE GLUCOSE: 148 mg/dL
HEMOGLOBIN A1C: 6.8 % — AB (ref 4.8–5.6)

## 2016-11-01 MED ORDER — DESIPRAMINE HCL 25 MG PO TABS: 25.0000 mg | ORAL_TABLET | Freq: Every day | ORAL | 1 refills | Status: DC

## 2016-11-02 ENCOUNTER — Other Ambulatory Visit: Payer: Self-pay

## 2016-11-02 NOTE — Patient Outreach (Signed)
[facility]) Care Management  11/02/2016  Luverta Korte Jan 26, 1946 [id number]    Medication Adherence to Stephanie Singleton  Mrs. Barley  Pick up her medication for tradjenta  5 mg on 10/29/16 for 90 days supply she is doing fine with medication.    [facility] Direct Dial [phone]  Fax [phone] [email or web address]

## 2016-11-05 DIAGNOSIS — H35033 Hypertensive retinopathy, bilateral: Secondary | ICD-10-CM | POA: Diagnosis not present

## 2016-11-05 DIAGNOSIS — E11319 Type 2 diabetes mellitus with unspecified diabetic retinopathy without macular edema: Secondary | ICD-10-CM | POA: Diagnosis not present

## 2016-11-05 DIAGNOSIS — H2513 Age-related nuclear cataract, bilateral: Secondary | ICD-10-CM | POA: Diagnosis not present

## 2016-11-05 DIAGNOSIS — H04123 Dry eye syndrome of bilateral lacrimal glands: Secondary | ICD-10-CM | POA: Diagnosis not present

## 2016-11-06 ENCOUNTER — Encounter: Payer: Medicare Other | Admitting: Family Medicine

## 2016-11-14 ENCOUNTER — Ambulatory Visit (INDEPENDENT_AMBULATORY_CARE_PROVIDER_SITE_OTHER): Payer: Medicare Other | Admitting: *Deleted

## 2016-11-14 DIAGNOSIS — I272 Pulmonary hypertension, unspecified: Secondary | ICD-10-CM | POA: Diagnosis not present

## 2016-11-14 LAB — POCT INR: INR: 2.6

## 2016-12-03 ENCOUNTER — Other Ambulatory Visit: Payer: Self-pay | Admitting: Family Medicine

## 2016-12-04 ENCOUNTER — Other Ambulatory Visit: Payer: Self-pay

## 2016-12-04 ENCOUNTER — Ambulatory Visit (INDEPENDENT_AMBULATORY_CARE_PROVIDER_SITE_OTHER): Payer: Medicare Other | Admitting: Family Medicine

## 2016-12-04 VITALS — BP 118/78 | HR 82 | Resp 16 | Ht 66.0 in | Wt 224.0 lb

## 2016-12-04 DIAGNOSIS — I10 Essential (primary) hypertension: Secondary | ICD-10-CM | POA: Diagnosis not present

## 2016-12-04 DIAGNOSIS — M542 Cervicalgia: Secondary | ICD-10-CM

## 2016-12-04 DIAGNOSIS — E1121 Type 2 diabetes mellitus with diabetic nephropathy: Secondary | ICD-10-CM

## 2016-12-04 DIAGNOSIS — E669 Obesity, unspecified: Secondary | ICD-10-CM | POA: Diagnosis not present

## 2016-12-04 DIAGNOSIS — G4730 Sleep apnea, unspecified: Secondary | ICD-10-CM

## 2016-12-04 DIAGNOSIS — E785 Hyperlipidemia, unspecified: Secondary | ICD-10-CM

## 2016-12-04 DIAGNOSIS — R0609 Other forms of dyspnea: Secondary | ICD-10-CM

## 2016-12-04 DIAGNOSIS — G8929 Other chronic pain: Secondary | ICD-10-CM | POA: Diagnosis not present

## 2016-12-04 MED ORDER — AMLODIPINE BESYLATE 5 MG PO TABS: 5.0000 mg | ORAL_TABLET | Freq: Every day | ORAL | 5 refills | Status: DC

## 2016-12-04 NOTE — Progress Notes (Signed)
Stephanie Singleton     MRN: [id number]      DOB: 1946-01-10   HPI Ms. Walt is here for annual exam, but wants to change to problem oriented visit because of concerns. Awakening tired , worse in the  past several years, goes to bed at 2am falls asleep within 30 mins uninterrupted awakens tired, have a chronic h/o fatigue and for years took nodose, states spouse has to shake her aty times to to  See if she is still here , chronic fatigue and falls asleep just sitting in a chair Shortness of breath with minimal activity noted t to be worse in past 2 to 3 months, no chest pain, no associated nausea or diaphoresis, always sleeps on 2 top 3 pillows, no change no c/o PND or leg swelling C/o increased pulling on right side of neck as though something is pulling x 6 month, had MRI from neurosurgeon but has had no clinical evaluation since that time, has used a few hydrocodone which s over  31 year old and no real relief   ROS Denies recent fever or chills. Denies sinus pressure, nasal congestion, ear pain or sore throat. Denies chest congestion, productive cough or wheezing. Denies chest pains, palpitations and leg swelling Denies abdominal pain, nausea, vomiting,diarrhea or constipation.   Denies dysuria, frequency, hesitancy or incontinence. . Denies headaches, seizures, numbness, or tingling. Denies depression, anxiety or insomnia. Denies skin break down or rash.   PE  BP 118/78   Pulse 82   Resp 16   Ht 5\' 6"  (1.676 m)   Wt 224 lb (101.6 kg)   SpO2 97%   BMI 36.15 kg/m   Patient alert and oriented and in no cardiopulmonary distress.  HEENT: No facial asymmetry, EOMI,   oropharynx pink and moist.  Neck decreased though adequate ROM  no JVD, no mass.  Chest: Clear to auscultation bilaterally.  CVS: S1, S2 no murmurs, no S3.Regular rate. EKG: NSR, non specific st changes, no LVH, no acute ischemia ABD: Soft non tender.   Ext: No edema  MS: Adequate ROM spine, shoulders,  hips and knees.  Skin: Intact, no ulcerations or rash noted.  Psych: Good eye contact, normal affect. Memory intact not anxious or depressed appearing.  CNS: CN 2-12 intact, power,  normal throughout.no focal deficits noted.   Assessment & Plan  Sleep apnea in adult Refer to pulmonary for eval and management, 6 month h/o worsened fatigue with report of snoring and periods of apnea during sleep, has had these  Complaints for years but now ready to be tested for sleep apnea  Dyspnea on exertion 4 month h/o worsening exertional dyspnea and poor exercise tolerance , eKG in office, shows new non specific st  changes , will refer to cardiology, for furhter eval, no symptoms of heart failure  Neck pain, chronic 6 month h/o increased neck pain and spasm, had imaging study in November, states neurosurgeon did not review the study with her and she is having increased pain and spasm not responding to pain medication, will refer for review with neurosurgeon  Hypertension goal BP (blood pressure) < 130/80 Change to amlodipine from maxzide due to increased creatinine DASH diet and commitment to daily physical activity for a minimum of 30 minutes discussed and encouraged, as a part of hypertension management. The importance of attaining a healthy weight is also discussed.  BP/Weight 12/04/2016 09/04/2016 08/07/2016 07/26/2016 06/27/2016 04/18/2016 10/08/8117  Systolic BP 147 829 562 130 865 784 696  Diastolic  BP 78 78 82 84 76 82 82  Wt. (Lbs) 224 226 223.13 - 231 230.2 225  BMI 36.15 36.48 36.01 - 37.28 37.16 36.33       Hyperlipidemia LDL goal <100 Hyperlipidemia:Low fat diet discussed and encouraged.   Lipid Panel  Lab Results  Component Value Date   CHOL 182 06/27/2016   HDL 46 (L) 06/27/2016   LDLCALC 81 06/27/2016   TRIG 275 (H) 06/27/2016   CHOLHDL 4.0 06/27/2016   Updated lab needed at/ before next visit. Needs to reduce fried and fatty foods, elevated TG    Obesity (BMI  30.0-34.9) Unchnaged Patient re-educated about  the importance of commitment to a  minimum of 150 minutes of exercise per week.  The importance of healthy food choices with portion control discussed. Encouraged to start a food diary, count calories and to consider  joining a support group. Sample diet sheets offered. Goals set by the patient for the next several months.   Weight /BMI 12/04/2016 09/04/2016 08/07/2016  WEIGHT 224 lb 226 lb 223 lb 2.1 oz  HEIGHT 5\' 6"  5\' 6"  5\' 6"   BMI 36.15 kg/m2 36.48 kg/m2 36.01 kg/m2

## 2016-12-04 NOTE — Assessment & Plan Note (Addendum)
4 month h/o worsening exertional dyspnea and poor exercise tolerance , eKG in office, shows new non specific st  changes , will refer to cardiology, for furhter eval, no symptoms of heart failure

## 2016-12-04 NOTE — Assessment & Plan Note (Addendum)
Refer to pulmonary for eval and management, 6 month h/o worsened fatigue with report of snoring and periods of apnea during sleep, has had these  Complaints for years but now ready to be tested for sleep apnea

## 2016-12-04 NOTE — Assessment & Plan Note (Signed)
6 month h/o increased neck pain and spasm, had imaging study in November, states neurosurgeon did not review the study with her and she is having increased pain and spasm not responding to pain medication, will refer for review with neurosurgeon

## 2016-12-04 NOTE — Patient Instructions (Addendum)
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

## 2016-12-05 ENCOUNTER — Encounter: Payer: Self-pay | Admitting: Family Medicine

## 2016-12-05 ENCOUNTER — Ambulatory Visit (INDEPENDENT_AMBULATORY_CARE_PROVIDER_SITE_OTHER): Payer: Medicare Other | Admitting: *Deleted

## 2016-12-05 DIAGNOSIS — I272 Pulmonary hypertension, unspecified: Secondary | ICD-10-CM | POA: Diagnosis not present

## 2016-12-05 LAB — POCT INR: INR: 4.1

## 2016-12-05 NOTE — Assessment & Plan Note (Signed)
Change to amlodipine from maxzide due to increased creatinine DASH diet and commitment to daily physical activity for a minimum of 30 minutes discussed and encouraged, as a part of hypertension management. The importance of attaining a healthy weight is also discussed.  BP/Weight 12/04/2016 09/04/2016 08/07/2016 07/26/2016 06/27/2016 04/18/2016 0/25/4270  Systolic BP 623 762 831 517 616 073 710  Diastolic BP 78 78 82 84 76 82 82  Wt. (Lbs) 224 226 223.13 - 231 230.2 225  BMI 36.15 36.48 36.01 - 37.28 37.16 36.33

## 2016-12-05 NOTE — Assessment & Plan Note (Signed)
Hyperlipidemia:Low fat diet discussed and encouraged.   Lipid Panel  Lab Results  Component Value Date   CHOL 182 06/27/2016   HDL 46 (L) 06/27/2016   LDLCALC 81 06/27/2016   TRIG 275 (H) 06/27/2016   CHOLHDL 4.0 06/27/2016   Updated lab needed at/ before next visit. Needs to reduce fried and fatty foods, elevated TG

## 2016-12-05 NOTE — Assessment & Plan Note (Signed)
Unchnaged Patient re-educated about  the importance of commitment to a  minimum of 150 minutes of exercise per week.  The importance of healthy food choices with portion control discussed. Encouraged to start a food diary, count calories and to consider  joining a support group. Sample diet sheets offered. Goals set by the patient for the next several months.   Weight /BMI 12/04/2016 09/04/2016 08/07/2016  WEIGHT 224 lb 226 lb 223 lb 2.1 oz  HEIGHT 5\' 6"  5\' 6"  5\' 6"   BMI 36.15 kg/m2 36.48 kg/m2 36.01 kg/m2

## 2016-12-14 ENCOUNTER — Other Ambulatory Visit: Payer: Self-pay | Admitting: Family Medicine

## 2016-12-18 ENCOUNTER — Ambulatory Visit (INDEPENDENT_AMBULATORY_CARE_PROVIDER_SITE_OTHER): Payer: Medicare Other | Admitting: *Deleted

## 2016-12-18 ENCOUNTER — Ambulatory Visit (INDEPENDENT_AMBULATORY_CARE_PROVIDER_SITE_OTHER): Payer: Medicare Other | Admitting: Physician Assistant

## 2016-12-18 ENCOUNTER — Encounter: Payer: Self-pay | Admitting: Physician Assistant

## 2016-12-18 VITALS — BP 124/74 | HR 82 | Ht 66.0 in | Wt 227.0 lb

## 2016-12-18 DIAGNOSIS — I10 Essential (primary) hypertension: Secondary | ICD-10-CM

## 2016-12-18 DIAGNOSIS — Z86711 Personal history of pulmonary embolism: Secondary | ICD-10-CM

## 2016-12-18 DIAGNOSIS — R0602 Shortness of breath: Secondary | ICD-10-CM

## 2016-12-18 DIAGNOSIS — I5032 Chronic diastolic (congestive) heart failure: Secondary | ICD-10-CM | POA: Diagnosis not present

## 2016-12-18 DIAGNOSIS — I272 Pulmonary hypertension, unspecified: Secondary | ICD-10-CM

## 2016-12-18 LAB — POCT INR: INR: 2.3

## 2016-12-18 NOTE — Progress Notes (Signed)
Cardiology Office Note    Date:  12/18/2016  ID:  Stephanie Singleton, DOB 08/20/1945, MRN [id number] PCP:  Fayrene Helper, MD  Cardiologist: Dr. Meda Coffee   Chief Complaint: shortness of breath for 1 year  History of Present Illness:  Stephanie Singleton is a 71 y.o. female with history of remote saddle PE in 8242, chronic diastolic CHF with chronic lower extremity edema, DM, GERD, HTN, HLD, OSA (noncompliant with CPAP), probable CKD per labs who presents for routine follow-up. She was remotely followed by Jory Sims but has since established with Dr. Meda Coffee in 04/2016. 2D echo 01/2013 showed moderate focal basal hypertrophy, EF 65-70%, grade 1 DD, mild MAC and aortic valve calcification, trace trivial regurg, unable to assess PASP, normal CVP. Repeat CTA 2010 was negative for recurrent PE.  She presents back today to discuss shortness of breath and fatigue. She has had chronic fatigue syndrome for the past 15 years but feels it is more significant over this last year. Her PCP has ordered repeat sleep study. She has noticed her SOB becoming more prominent over the last year - can occur at rest but also much worse with exertion. She's not had any chest pain. She has been compliant with her Coumadin. Last INR 4.1 with plan for recheck today. She's not had any bleeding. She saw PCP for these complaints on 12/04/16 at which time showed A1C 6.8, Na 137, K 4.5, Cr 1.54 (was 1.55 in 06/2016); last LDL 81 and trig 275 in 06/2016. LE art duplex 05/2016 was normal. She uses torsemide twice a week PRN leg swelling and has not noticed any significant weight change, only a 2-3 lb fluctuation up and down throughout the week.   Past Medical History:  Diagnosis Date  . Chronic diastolic CHF (congestive heart failure) ([place])   . CKD (chronic kidney disease), stage III   . Diabetes mellitus DX X 1 YR   USE METFORMIN ACCORDING TO CBG RESULT  . Embolism - blood clot 2004   Pulmonary .on coumadin  . GERD  (gastroesophageal reflux disease)    ON PREVACID  . HTN (hypertension)    EKG 10/23 IN EPIC  . Hyperlipidemia   . Hypertension    CARDIOLOGY VISIT.CARDIAC CLEARANCE.LOVENOX INSTRUCTIONS.  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 OSA (obstructive sleep apnea) DX 8 YRS AGO.DOES NOT WEAR MASK  . Pulmonary embolism ([place])    a. 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  . Tumor of soft tissue of neck     Past Surgical History:  Procedure Laterality Date  . CERVICAL FUSION      Current Medications: Current Outpatient Prescriptions  Medication Sig Dispense Refill  . amLODipine (NORVASC) 5 MG tablet Take 1 tablet (5 mg total) by mouth daily. 30 tablet 5  . cyclobenzaprine (FLEXERIL) 10 MG tablet Take 1 tablet (10 mg total) by mouth at bedtime. 30 tablet 3  . desipramine (NORPRAMIN) 25 MG tablet Take 1 tablet (25 mg total) by mouth at bedtime. 90 tablet 1  . glucose blood (ACCU-CHEK AVIVA PLUS) test strip TEST ONCE DAILY dx E11.9 100 each 3  . HYDROcodone-acetaminophen (NORCO/VICODIN) 5-325 MG tablet Take 1 tablet by mouth 2 (two) times daily as needed (pain).    . hydrOXYzine (ATARAX/VISTARIL) 50 MG tablet TAKE 1 TABLET (50 MG TOTAL) BY MOUTH AT BEDTIME. 30 tablet 1  . linagliptin (TRADJENTA) 5 MG TABS tablet Take 1 tablet (5  mg total) by mouth daily. 90 tablet 1  . loratadine (CLARITIN) 10 MG tablet Take 10 mg by mouth daily as needed for allergies.    Marland Kitchen losartan (COZAAR) 100 MG tablet TAKE 1 TABLET (100 MG TOTAL) BY MOUTH DAILY. 90 tablet 1  . pantoprazole (PROTONIX) 40 MG tablet TAKE 1 TABLET (40 MG TOTAL) BY MOUTH DAILY. 90 tablet 3  . potassium chloride SA (K-DUR,KLOR-CON) 20 MEQ tablet Take 1 tablet (20 mEq total) by mouth 2 (two) times a week. *Take only on the days that Demadex (TORSEMIDE) is taken* 30 tablet 2  . pravastatin (PRAVACHOL) 20 MG tablet TAKE 1 TABLET BY MOUTH DAILY 90 tablet 1  . torsemide (DEMADEX) 20 MG  tablet as directed. TAKE 1 TABLET BY MOUTH 2 TIMES A WEEK AS NEEDED FOR SWELLING    . Vitamin D, Ergocalciferol, (DRISDOL) 50000 units CAPS capsule Take 1 capsule by mouth as directed. As needed    . warfarin (COUMADIN) 2.5 MG tablet Take as directed by coumadin clinic 35 tablet 1   No current facility-administered medications for this visit.      Allergies:   Aspirin and Lisinopril   Social History   Social History  . Marital status: Divorced    Spouse name: N/A  . Number of children: 34  . Years of education: N/A   Occupational History  .  Disabled     [employer]   Social History Main Topics  . Smoking status: Former Smoker    Packs/day: 0.25    Years: 20.00    Quit date: 08/07/1996  . Smokeless tobacco: Never Used  . Alcohol use No  . Drug use: No  . Sexual activity: Yes   Other Topics Concern  . None   Social History Narrative  . None     Family History:  Family History  Problem Relation Age of Onset  . Heart disease Mother   . Hypertension Mother   . Stroke Mother   . Diabetes Father   . Peripheral vascular disease Father   . Peripheral vascular disease Brother   . Heart disease Brother   . Bladder Cancer Sister 74  . Diabetes Daughter        3 of the 5 daughters  . Diabetes Son        3 of the sons  . Breast cancer Sister 82    ROS:   Please see the history of present illness.  All other systems are reviewed and otherwise negative.    PHYSICAL EXAM:   VS:  BP 124/74   Pulse 82   Ht 5' 6"  (1.676 m)   Wt 227 lb (103 kg)   SpO2 98%   BMI 36.64 kg/m   BMI: Body mass index is 36.64 kg/m. GEN: Well nourished, well developed obese AAF, in no acute distress  HEENT: normocephalic, atraumatic Neck: no JVD, carotid bruits, or masses Cardiac: RRR; no murmurs, rubs, or gallops, no edema  Respiratory:  clear to auscultation bilaterally, normal work of breathing GI: soft, nontender, nondistended, + BS MS: no deformity or atrophy  Skin: warm and dry, no  rash Neuro:  Alert and Oriented x 3, Strength and sensation are intact, follows commands Psych: euthymic mood, full affect  Wt Readings from Last 3 Encounters:  12/18/16 227 lb (103 kg)  12/04/16 224 lb (101.6 kg)  09/04/16 226 lb (102.5 kg)      Studies/Labs Reviewed:   EKG:  EKG was ordered today and personally reviewed by me  and demonstrates NSR 87bpm LVH with nonspecific TW abnormality  Recent Labs: 06/27/2016: TSH 0.97 10/31/2016: ALT 17; BUN 25; Creatinine, Ser 1.54; Potassium 4.5; Sodium 137   Lipid Panel    Component Value Date/Time   CHOL 182 06/27/2016 1428   TRIG 275 (H) 06/27/2016 1428   HDL 46 (L) 06/27/2016 1428   CHOLHDL 4.0 06/27/2016 1428   VLDL 55 (H) 06/27/2016 1428   LDLCALC 81 06/27/2016 1428    Additional studies/ records that were reviewed today include: Summarized above.    ASSESSMENT & PLAN:   1. Shortness of breath - progressive for the past year. I think her history of prior PE, obesity, and untreated prior OSA warrants further evaluation with echocardiogram to evaluate for pulm HTN. Will also reassess LV function with this study as well as LVH which remains apparent by EKGs. She has never had ischemic evaluation. Given HTN, DM, HLD I feel it is also prudent to proceed with nuclear stress test. We discussed treadmill vs pharmacologic - she would like to try the treadmill first and we can change to [place] if needed. Will also check BNP and CBC for completeness today. 2. Chronic diastolic CHF - volume status appears stable on exam today but will f/u BNP. Discussed importance of compliance with CPAP if OSA is redemonstrated. 3. History of PE - remains on Coumadin as episode was unprovoked. She is having INR checked today. Has not had CBC since last year. She is not tachycardic, tachypneic or hypoxic. 4. Essential HTN - controlled.  Disposition: F/u with me after above testing.   Medication Adjustments/Labs and Tests Ordered: Current medicines are  reviewed at length with the patient today.  Concerns regarding medicines are outlined above. Medication changes, Labs and Tests ordered today are summarized above and listed in the Patient Instructions accessible in Encounters.   Signed, Charlie Pitter, PA-C  12/18/2016 4:10 PM    [facility] [address], [place], [place]  [address] Phone: [phone]; Fax: [phone]

## 2016-12-18 NOTE — Patient Instructions (Addendum)
Medication Instructions:  Your physician recommends that you continue on your current medications as directed. Please refer to the Current Medication list given to you today.   Labwork: TODAY:  CBC & PRO BNP  Testing/Procedures: Your physician has requested that you have an echocardiogram. Echocardiography is a painless test that uses sound waves to create images of your heart. It provides your doctor with information about the size and shape of your heart and how well your heart's chambers and valves are working. This procedure takes approximately one hour. There are no restrictions for this procedure.   Your physician has requested that you have en exercise stress myoview. For further information please visit [email or web address]. Please follow instruction sheet, as given.    Follow-Up: Your physician recommends that you schedule a follow-up appointment in: AFTER ALL TEST COMPLETE WITH DAYNA DUNN, PA-C  Any Other Special Instructions Will Be Listed Below (If Applicable).  Echocardiogram An echocardiogram, or echocardiography, uses sound waves (ultrasound) to produce an image of your heart. The echocardiogram is simple, painless, obtained within a short period of time, and offers valuable information to your health care provider. The images from an echocardiogram can provide information such as:  Evidence of coronary artery disease (CAD).  Heart size.  Heart muscle function.  Heart valve function.  Aneurysm detection.  Evidence of a past heart attack.  Fluid buildup around the heart.  Heart muscle thickening.  Assess heart valve function.  Tell a health care provider about:  Any allergies you have.  All medicines you are taking, including vitamins, herbs, eye drops, creams, and over-the-counter medicines.  Any problems you or family members have had with anesthetic medicines.  Any blood disorders you have.  Any surgeries you have had.  Any medical conditions you  have.  Whether you are pregnant or may be pregnant. What happens before the procedure? No special preparation is needed. Eat and drink normally. What happens during the procedure?  In order to produce an image of your heart, gel will be applied to your chest and a wand-like tool (transducer) will be moved over your chest. The gel will help transmit the sound waves from the transducer. The sound waves will harmlessly bounce off your heart to allow the heart images to be captured in real-time motion. These images will then be recorded.  You may need an IV to receive a medicine that improves the quality of the pictures. What happens after the procedure? You may return to your normal schedule including diet, activities, and medicines, unless your health care provider tells you otherwise. This information is not intended to replace advice given to you by your health care provider. Make sure you discuss any questions you have with your health care provider. Document Released: 06/15/2000 Document Revised: 02/04/2016 Document Reviewed: 02/23/2013 Elsevier Interactive Patient Education  2017 [place].    Exercise Stress Electrocardiogram An exercise stress electrocardiogram is a test to check how blood flows to your heart. It is done to find areas of poor blood flow. You will need to walk on a treadmill for this test. The electrocardiogram will record your heartbeat when you are at rest and when you are exercising. What happens before the procedure?  Do not have drinks with caffeine or foods with caffeine for 24 hours before the test, or as told by your doctor. This includes coffee, tea (even decaf tea), sodas, chocolate, and cocoa.  Follow your doctor's instructions about eating and drinking before the test.  Ask your doctor what  medicines you should or should not take before the test. Take your medicines with water unless told by your doctor not to.  If you use an inhaler, bring it with you  to the test.  Bring a snack to eat after the test.  Do not  smoke for 4 hours before the test.  Do not put lotions, powders, creams, or oils on your chest before the test.  Wear comfortable shoes and clothing. What happens during the procedure?  You will have patches put on your chest. Small areas of your chest may need to be shaved. Wires will be connected to the patches.  Your heart rate will be watched while you are resting and while you are exercising.  You will walk on the treadmill. The treadmill will slowly get faster to raise your heart rate.  The test will take about 1-2 hours. What happens after the procedure?  Your heart rate and blood pressure will be watched after the test.  You may return to your normal diet, activities, and medicines or as told by your doctor. This information is not intended to replace advice given to you by your health care provider. Make sure you discuss any questions you have with your health care provider. Document Released: 12/05/2007 Document Revised: 02/15/2016 Document Reviewed: 02/23/2013 Elsevier Interactive Patient Education  [employer].  If you need a refill on your cardiac medications before your next appointment, please call your pharmacy.

## 2016-12-19 ENCOUNTER — Telehealth: Payer: Self-pay | Admitting: Physician Assistant

## 2016-12-19 LAB — CBC
Hematocrit: 36.1 % (ref 34.0–46.6)
Hemoglobin: 12.5 g/dL (ref 11.1–15.9)
MCH: 27.4 pg (ref 26.6–33.0)
MCHC: 34.6 g/dL (ref 31.5–35.7)
MCV: 79 fL (ref 79–97)
PLATELETS: 330 10*3/uL (ref 150–379)
RBC: 4.56 x10E6/uL (ref 3.77–5.28)
RDW: 14.9 % (ref 12.3–15.4)
WBC: 8 10*3/uL (ref 3.4–10.8)

## 2016-12-19 LAB — PRO B NATRIURETIC PEPTIDE: NT-Pro BNP: 88 pg/mL (ref 0–301)

## 2016-12-19 NOTE — Telephone Encounter (Signed)
New message ° ° ° °Pt is returning call to Jennifer about labs.  °

## 2016-12-19 NOTE — Telephone Encounter (Signed)
-----   Message from Charlie Pitter, [place] sent at 12/19/2016  8:07 AM EDT ----- Please let patient know BNP was normal (stable fluid status) and CBC was normal - no evidence of anemia. Continue plan as discussed. Dayna Dunn PA-C

## 2016-12-19 NOTE — Telephone Encounter (Signed)
Returned pts call and she has been made aware of her lab results. 

## 2016-12-24 DIAGNOSIS — D3611 Benign neoplasm of peripheral nerves and autonomic nervous system of face, head, and neck: Secondary | ICD-10-CM | POA: Diagnosis not present

## 2016-12-27 ENCOUNTER — Other Ambulatory Visit: Payer: Self-pay | Admitting: Neurosurgery

## 2016-12-27 DIAGNOSIS — D3611 Benign neoplasm of peripheral nerves and autonomic nervous system of face, head, and neck: Secondary | ICD-10-CM

## 2017-01-03 ENCOUNTER — Telehealth (HOSPITAL_COMMUNITY): Payer: Self-pay | Admitting: *Deleted

## 2017-01-03 NOTE — Telephone Encounter (Signed)
Left message on voicemail per DPR in reference to upcoming appointment scheduled on 01/08/17 at 1000 with detailed instructions given per Myocardial Perfusion Study Information Sheet for the test. LM to arrive 15 minutes early, and that it is imperative to arrive on time for appointment to keep from having the test rescheduled. If you need to cancel or reschedule your appointment, please call the office within 24 hours of your appointment. Failure to do so may result in a cancellation of your appointment, and a $50 no show fee. Phone number given for call back for any questions.

## 2017-01-04 ENCOUNTER — Encounter (HOSPITAL_COMMUNITY): Payer: Medicare Other

## 2017-01-04 ENCOUNTER — Other Ambulatory Visit (HOSPITAL_COMMUNITY): Payer: Medicare Other

## 2017-01-08 ENCOUNTER — Ambulatory Visit (HOSPITAL_BASED_OUTPATIENT_CLINIC_OR_DEPARTMENT_OTHER): Payer: Medicare Other

## 2017-01-08 ENCOUNTER — Encounter: Payer: Self-pay | Admitting: Physician Assistant

## 2017-01-08 ENCOUNTER — Ambulatory Visit (HOSPITAL_COMMUNITY): Payer: Medicare Other | Attending: Internal Medicine

## 2017-01-08 ENCOUNTER — Ambulatory Visit (INDEPENDENT_AMBULATORY_CARE_PROVIDER_SITE_OTHER): Payer: Medicare Other

## 2017-01-08 ENCOUNTER — Other Ambulatory Visit: Payer: Self-pay

## 2017-01-08 DIAGNOSIS — I77810 Thoracic aortic ectasia: Secondary | ICD-10-CM | POA: Diagnosis not present

## 2017-01-08 DIAGNOSIS — I130 Hypertensive heart and chronic kidney disease with heart failure and stage 1 through stage 4 chronic kidney disease, or unspecified chronic kidney disease: Secondary | ICD-10-CM | POA: Insufficient documentation

## 2017-01-08 DIAGNOSIS — E1122 Type 2 diabetes mellitus with diabetic chronic kidney disease: Secondary | ICD-10-CM | POA: Insufficient documentation

## 2017-01-08 DIAGNOSIS — I272 Pulmonary hypertension, unspecified: Secondary | ICD-10-CM | POA: Diagnosis not present

## 2017-01-08 DIAGNOSIS — R0602 Shortness of breath: Secondary | ICD-10-CM | POA: Diagnosis not present

## 2017-01-08 DIAGNOSIS — G4733 Obstructive sleep apnea (adult) (pediatric): Secondary | ICD-10-CM | POA: Diagnosis not present

## 2017-01-08 DIAGNOSIS — R0609 Other forms of dyspnea: Secondary | ICD-10-CM | POA: Insufficient documentation

## 2017-01-08 DIAGNOSIS — I509 Heart failure, unspecified: Secondary | ICD-10-CM | POA: Diagnosis not present

## 2017-01-08 DIAGNOSIS — I77819 Aortic ectasia, unspecified site: Secondary | ICD-10-CM | POA: Insufficient documentation

## 2017-01-08 DIAGNOSIS — I361 Nonrheumatic tricuspid (valve) insufficiency: Secondary | ICD-10-CM | POA: Diagnosis not present

## 2017-01-08 DIAGNOSIS — N189 Chronic kidney disease, unspecified: Secondary | ICD-10-CM | POA: Insufficient documentation

## 2017-01-08 DIAGNOSIS — R5383 Other fatigue: Secondary | ICD-10-CM | POA: Insufficient documentation

## 2017-01-08 DIAGNOSIS — E785 Hyperlipidemia, unspecified: Secondary | ICD-10-CM | POA: Diagnosis not present

## 2017-01-08 LAB — ECHOCARDIOGRAM COMPLETE
Height: 66 in
Weight: 3632 oz

## 2017-01-08 LAB — POCT INR: INR: 2.2

## 2017-01-08 MED ORDER — TECHNETIUM TC 99M TETROFOSMIN IV KIT
33.0000 | PACK | Freq: Once | INTRAVENOUS | Status: AC | PRN
  Administered 2017-01-08: 33 via INTRAVENOUS
  Filled 2017-01-08: qty 33

## 2017-01-08 MED ORDER — REGADENOSON 0.4 MG/5ML IV SOLN
0.4000 mg | Freq: Once | INTRAVENOUS | Status: AC
  Administered 2017-01-08: 0.4 mg via INTRAVENOUS

## 2017-01-09 ENCOUNTER — Telehealth: Payer: Self-pay | Admitting: Physician Assistant

## 2017-01-09 ENCOUNTER — Telehealth: Payer: Self-pay | Admitting: *Deleted

## 2017-01-09 NOTE — Telephone Encounter (Signed)
Returned call to pt and she has been made aware of her lab results.  

## 2017-01-09 NOTE — Telephone Encounter (Signed)
-----   Message from Charlie Pitter, [place] sent at 01/08/2017  4:24 PM EDT ----- Please let patient know echo appears similar to prior, normal LVEF, + grade 1 diastolic dysfunction known from prior. Review HF precautions with her (2g sodium diet, 2L fluid restriction, daily weights). This is similar to before in 2014. She does have mild dilation of ascending aorta at 3.9cm - nothing to do about this for now, but something we will likely follow with yearly echo. Dayna Dunn PA-C

## 2017-01-09 NOTE — Telephone Encounter (Signed)
New Message ° ° pt verbalized that she is returning call for rn °

## 2017-01-09 NOTE — Telephone Encounter (Signed)
Returned pt's call.  See result note.  

## 2017-01-10 ENCOUNTER — Ambulatory Visit (HOSPITAL_COMMUNITY): Payer: Medicare Other | Attending: Cardiology

## 2017-01-10 ENCOUNTER — Other Ambulatory Visit: Payer: Self-pay | Admitting: Family Medicine

## 2017-01-10 LAB — MYOCARDIAL PERFUSION IMAGING
CHL CUP NUCLEAR SDS: 4
CHL CUP NUCLEAR SRS: 2
LV dias vol: 75 mL (ref 46–106)
LV sys vol: 35 mL
NUC STRESS TID: 1.18
Peak HR: 106 {beats}/min
RATE: 0.26
Rest HR: 63 {beats}/min
SSS: 6

## 2017-01-10 MED ORDER — TECHNETIUM TC 99M TETROFOSMIN IV KIT
32.7000 | PACK | Freq: Once | INTRAVENOUS | Status: AC | PRN
  Administered 2017-01-10: 32.7 via INTRAVENOUS
  Filled 2017-01-10: qty 33

## 2017-01-15 ENCOUNTER — Other Ambulatory Visit: Payer: Medicare Other

## 2017-01-20 ENCOUNTER — Other Ambulatory Visit: Payer: Self-pay | Admitting: Cardiovascular Disease

## 2017-01-21 DIAGNOSIS — E119 Type 2 diabetes mellitus without complications: Secondary | ICD-10-CM | POA: Diagnosis not present

## 2017-01-21 DIAGNOSIS — R079 Chest pain, unspecified: Secondary | ICD-10-CM | POA: Diagnosis not present

## 2017-01-21 DIAGNOSIS — Z79899 Other long term (current) drug therapy: Secondary | ICD-10-CM | POA: Diagnosis not present

## 2017-01-21 DIAGNOSIS — R0689 Other abnormalities of breathing: Secondary | ICD-10-CM | POA: Diagnosis not present

## 2017-01-21 DIAGNOSIS — Z886 Allergy status to analgesic agent status: Secondary | ICD-10-CM | POA: Diagnosis not present

## 2017-01-21 DIAGNOSIS — E0822 Diabetes mellitus due to underlying condition with diabetic chronic kidney disease: Secondary | ICD-10-CM | POA: Diagnosis not present

## 2017-01-21 DIAGNOSIS — Z87891 Personal history of nicotine dependence: Secondary | ICD-10-CM | POA: Diagnosis not present

## 2017-01-21 DIAGNOSIS — K219 Gastro-esophageal reflux disease without esophagitis: Secondary | ICD-10-CM | POA: Diagnosis not present

## 2017-01-21 DIAGNOSIS — E7800 Pure hypercholesterolemia, unspecified: Secondary | ICD-10-CM | POA: Diagnosis not present

## 2017-01-21 DIAGNOSIS — I160 Hypertensive urgency: Secondary | ICD-10-CM | POA: Diagnosis not present

## 2017-01-21 DIAGNOSIS — I10 Essential (primary) hypertension: Secondary | ICD-10-CM | POA: Diagnosis not present

## 2017-01-21 DIAGNOSIS — I700 Atherosclerosis of aorta: Secondary | ICD-10-CM | POA: Diagnosis not present

## 2017-01-21 MED ORDER — WARFARIN SODIUM 2.5 MG PO TABS: ORAL_TABLET | ORAL | 3 refills | Status: DC

## 2017-01-22 DIAGNOSIS — R079 Chest pain, unspecified: Secondary | ICD-10-CM | POA: Diagnosis not present

## 2017-01-22 DIAGNOSIS — E7800 Pure hypercholesterolemia, unspecified: Secondary | ICD-10-CM | POA: Diagnosis not present

## 2017-01-22 DIAGNOSIS — R0782 Intercostal pain: Secondary | ICD-10-CM | POA: Diagnosis not present

## 2017-01-22 DIAGNOSIS — N181 Chronic kidney disease, stage 1: Secondary | ICD-10-CM | POA: Diagnosis not present

## 2017-01-22 DIAGNOSIS — E1122 Type 2 diabetes mellitus with diabetic chronic kidney disease: Secondary | ICD-10-CM | POA: Diagnosis not present

## 2017-01-22 DIAGNOSIS — I10 Essential (primary) hypertension: Secondary | ICD-10-CM | POA: Diagnosis not present

## 2017-01-23 ENCOUNTER — Encounter: Payer: Self-pay | Admitting: Physician Assistant

## 2017-01-23 ENCOUNTER — Telehealth: Payer: Self-pay | Admitting: Family Medicine

## 2017-01-23 ENCOUNTER — Ambulatory Visit (INDEPENDENT_AMBULATORY_CARE_PROVIDER_SITE_OTHER): Payer: Medicare Other | Admitting: Physician Assistant

## 2017-01-23 VITALS — BP 124/70 | HR 84 | Ht 66.0 in | Wt 228.0 lb

## 2017-01-23 DIAGNOSIS — I10 Essential (primary) hypertension: Secondary | ICD-10-CM | POA: Diagnosis not present

## 2017-01-23 DIAGNOSIS — N183 Chronic kidney disease, stage 3 unspecified: Secondary | ICD-10-CM

## 2017-01-23 DIAGNOSIS — Z86711 Personal history of pulmonary embolism: Secondary | ICD-10-CM | POA: Diagnosis not present

## 2017-01-23 DIAGNOSIS — I5032 Chronic diastolic (congestive) heart failure: Secondary | ICD-10-CM | POA: Diagnosis not present

## 2017-01-23 DIAGNOSIS — R0602 Shortness of breath: Secondary | ICD-10-CM | POA: Diagnosis not present

## 2017-01-23 DIAGNOSIS — R072 Precordial pain: Secondary | ICD-10-CM

## 2017-01-23 MED ORDER — ISOSORBIDE MONONITRATE ER 30 MG PO TB24: 30.0000 mg | ORAL_TABLET | Freq: Every day | ORAL | 3 refills | Status: DC

## 2017-01-23 NOTE — Progress Notes (Signed)
Cardiology Office Note    Date:  01/23/2017  ID:  Stephanie Singleton, DOB Feb 15, 1946, MRN [id number] PCP:  Fayrene Helper, MD  Cardiologist:  Dr. Meda Coffee   Chief Complaint: f/u testing  History of Present Illness:  Stephanie Singleton is a 71 y.o. female with history of remote saddle PE in 8119, chronic diastolic CHF with chronic lower extremity edema managed with torsemide (approx 2x/week), DM, GERD, HTN, HLD (folloewd by PCP), OSA (previously noncompliant with CPAP), probable CKD III per labs who presents for routine follow-up. She was remotely followed by Jory Sims but has since established with Dr. Meda Coffee in 04/2016. 2D echo 01/2013 showed moderate focal basal hypertrophy, EF 65-70%, grade 1 DD, mild MAC and aortic valve calcification, trace trivial regurg, unable to assess PASP, normal CVP. Repeat CTA 2010 was negative for recurrent PE. LE art duplex for leg pain 05/2016 was normal.  I saw her in clinic 12/18/16 at which time she was reporting chronic fatigue x 15 years with recent progression, as well as gradual DOE over the last year. She remarried about 2 years ago at which time she states she slowed down a lot and gained about 30lb. BNP was normal at 88 along with normal CBC. Recent CMET 10/31/16 had shown Cr 1.54 similar to 06/2016. Last LDL 81 and trig 275 in 06/2016. 2D Echo 01/08/17 showed EF 60-65%, grade 1 DD, mildly dilated ascending aorta, no significant pulm HTN, normal CVP. Nuc 01/08/17 was normal. Referral to pulm recommended but not pursued by the patient.    In the interim she was seen in the ED 01/21/17 with what she reports to be 6 hours of constant, unremitting throat/chest pain in the setting of hypertensive urgency with BP 203/108. She had recently self-discontinued amlodipine due to leg swelling. Chest discomfort resolved with treatment of her HTN. Troponins were negative and EKG was reported to be nonacute. Her BP was felt exacerbated by untreated OSA. HCTZ 12.53m  daily was added. Labs otherwise during that encounter showed Hgb 12.0, Cr 1.10, LFTs OK, BNP<5. She was discharged yesterday.  She returns for follow-up overall feeling stable. No further chest pain. In general she does not typically get chest pain outside of recent event. DOE is about the same. No regular exercise. No significant change in weight recently. No orthopnea. No hx of asthma. Has 15 prior pack year hx. Of note, she has 13 children (all living) includes 3 sets of twins!   Past Medical History:  Diagnosis Date  . Chronic diastolic CHF (congestive heart failure) ([facility]   . CKD (chronic kidney disease), stage III   . Diabetes mellitus DX X 1 YR   USE METFORMIN ACCORDING TO CBG RESULT  . Dilatation of aorta (HCC)   . Embolism - blood clot 2004   Pulmonary .on coumadin  . GERD (gastroesophageal reflux disease)    ON PREVACID  . HTN (hypertension)    EKG 10/23 IN EPIC  . Hyperlipidemia   . Hypertension    CARDIOLOGY VISIT.CARDIAC CLEARANCE.LOVENOX INSTRUCTIONS.  .Marland KitchenNeuromuscular disorder (HCC)    FOUND CERVICAL TUMOR 8 YRS AGO.WEAKNESS NUMBNESS PAIN R ARM  HAND  . OSA (obstructive sleep apnea) DX 8 YRS AGO.DOES NOT WEAR MASK  . Pulmonary embolism ([place]    a. 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  . Tumor of soft tissue of neck     Past Surgical History:  Procedure Laterality Date  . CERVICAL FUSION      Current Medications: Current Meds  Medication Sig  . glucose blood (ACCU-CHEK AVIVA PLUS) test strip TEST ONCE DAILY dx E11.9  . hydrochlorothiazide (HYDRODIURIL) 12.5 MG tablet Take 12.5 mg by mouth daily.  Marland Kitchen HYDROcodone-acetaminophen (NORCO/VICODIN) 5-325 MG tablet Take 1 tablet by mouth 2 (two) times daily as needed (pain).  . hydrOXYzine (ATARAX/VISTARIL) 50 MG tablet TAKE 1 TABLET (50 MG TOTAL) BY MOUTH AT BEDTIME.  Marland Kitchen linagliptin (TRADJENTA) 5 MG TABS tablet Take 1 tablet (5 mg total) by mouth daily.  Marland Kitchen losartan (COZAAR) 100 MG tablet TAKE 1 TABLET (100 MG TOTAL) BY  MOUTH DAILY.  . pantoprazole (PROTONIX) 40 MG tablet TAKE 1 TABLET (40 MG TOTAL) BY MOUTH DAILY.  Marland Kitchen potassium chloride SA (K-DUR,KLOR-CON) 20 MEQ tablet Take 1 tablet (20 mEq total) by mouth 2 (two) times a week. *Take only on the days that Demadex (TORSEMIDE) is taken*  . pravastatin (PRAVACHOL) 20 MG tablet TAKE 1 TABLET BY MOUTH DAILY  . torsemide (DEMADEX) 20 MG tablet as directed. TAKE 1 TABLET BY MOUTH 2 TIMES A WEEK AS NEEDED FOR SWELLING  . warfarin (COUMADIN) 2.5 MG tablet Take as directed by coumadin clinic     Allergies:   Aspirin and Lisinopril   Social History   Social History  . Marital status: Divorced    Spouse name: N/A  . Number of children: 53  . Years of education: N/A   Occupational History  .  Disabled     [employer]   Social History Main Topics  . Smoking status: Former Smoker    Packs/day: 0.25    Years: 20.00    Quit date: 08/07/1996  . Smokeless tobacco: Never Used  . Alcohol use No  . Drug use: No  . Sexual activity: Yes   Other Topics Concern  . None   Social History Narrative  . None     Family History:  Family History  Problem Relation Age of Onset  . Heart disease Mother   . Hypertension Mother   . Stroke Mother   . Diabetes Father   . Peripheral vascular disease Father   . Peripheral vascular disease Brother   . Heart disease Brother   . Bladder Cancer Sister 99  . Diabetes Daughter        3 of the 5 daughters  . Diabetes Son        3 of the sons  . Breast cancer Sister 49    ROS:   Please see the history of present illness.  All other systems are reviewed and otherwise negative.    PHYSICAL EXAM:   VS:  BP 124/70   Pulse 84   Ht 5' 6"  (1.676 m)   Wt 228 lb (103.4 kg)   SpO2 96%   BMI 36.80 kg/m   BMI: Body mass index is 36.8 kg/m. GEN: Well nourished, well developed obese AAF, in no acute distress  HEENT: normocephalic, atraumatic Neck: no JVD, carotid bruits, or masses Cardiac: RRR; no murmurs, rubs, or gallops, no  edema  Respiratory:  clear to auscultation bilaterally, normal work of breathing GI: soft, nontender, nondistended, + BS MS: no deformity or atrophy  Skin: warm and dry, no rash Neuro:  Alert and Oriented x 3, Strength and sensation are intact, follows commands Psych: euthymic mood, full affect  Wt Readings from Last 3 Encounters:  01/23/17 228 lb (103.4 kg)  01/08/17 227 lb (103 kg)  12/18/16 227 lb (103 kg)      Studies/Labs Reviewed:   EKG:  EKG was  ordered today and personally reviewed by me and demonstrates NSR nonspecific ST-T changes I reviewed multiple EKGs including back to 2012 and 2014 which show similar changes  Recent Labs: 06/27/2016: TSH 0.97 10/31/2016: ALT 17; BUN 25; Creatinine, Ser 1.54; Potassium 4.5; Sodium 137 12/18/2016: Hemoglobin 12.5; NT-Pro BNP 88; Platelets 330   Lipid Panel    Component Value Date/Time   CHOL 182 06/27/2016 1428   TRIG 275 (H) 06/27/2016 1428   HDL 46 (L) 06/27/2016 1428   CHOLHDL 4.0 06/27/2016 1428   VLDL 55 (H) 06/27/2016 1428   LDLCALC 81 06/27/2016 1428    Additional studies/ records that were reviewed today include: Summarized above.    ASSESSMENT & PLAN:   1. Shortness of breath/chest pain - dyspnea has been progressive for the past year without acute onset. Echo, BNP, and nuclear stress test were unrevealing. She was recently admitted for chest/throat pain in the setting of hypertensive urgency after self discontinuing her amlodipine. Despite 6 hours of constant pain, troponins were negative. Sx improved with BP control. INR was therapeutic. She is not tachycardic, tachypneic or hypoxic. At this point it's not clear that symptoms are cardiac, but I did discuss definitive cardiac cath with her to lay the issue to rest regarding possible cardiac contribution. She is familiar with the procedure and is quite clear that she does not wish to proceed at present time. Will add empiric Imdur 70m daily. We discussed beta blocker  therapy but did not choose this due to her chronic fatigue. Discussed warning/ER precautions. She will notify our office of any recurrent chest pain, or increase in dyspnea. Pulm eval previously recommended but she declined. We discussed this recommendation today. She seems agreeable so will formally refer. 2. Chronic diastolic CHF - appears euvolemic. 3. History of PE - remains on Coumadin as episode was unprovoked. CTA 2010 was neg for recurrent event. She is not tachycardic, tachypneic or hypoxic. INR 2.7 at [facility] 2 days ago. No signs of DVT on exam. No recent evidence of bleeding. 4. Essential HTN - controlled on present regimen. [facility]. Will arrange. Discussed risks of uncontrolled OSA. She would be open to idea of sleep study if it was a home sleep study.   Disposition: F/u with 2-3 months with Dr. NMeda Coffee  Medication Adjustments/Labs and Tests Ordered: Current medicines are reviewed at length with the patient today.  Concerns regarding medicines are outlined above. Medication changes, Labs and Tests ordered today are summarized above and listed in the Patient Instructions accessible in Encounters.   Signed, DCharlie Pitter PA-C  01/23/2017 1:40 PM    [facility] [address] [place]  [address]Phone: [phone] Fax: [phone]

## 2017-01-23 NOTE — Telephone Encounter (Signed)
Patient left message on nurse line. She would like a call back to schedule hospital follow up

## 2017-01-23 NOTE — Patient Instructions (Addendum)
Medication Instructions:  Your physician has recommended you make the following change in your medication:  1.  START Imdur 30 mg taking 1 tablet daily   Labwork: 5 DAYS:  BMET  Testing/Procedures: Your physician has recommended that you have a sleep study. This test records several body functions during sleep, including: brain activity, eye movement, oxygen and carbon dioxide blood levels, heart rate and rhythm, breathing rate and rhythm, the flow of air through your mouth and nose, snoring, body muscle movements, and chest and belly movement.    Follow-Up: Your physician recommends that you schedule a follow-up appointment in: 2-3 MONTHS WITH DR. Meda Coffee OR AN APP ON HER TEAM ON A DAY SHE IS IN THE OFFICE    You have been referred to [facility] Pulmonology.  They will call you with an appointment.   Any Other Special Instructions Will Be Listed Below (If Applicable).  Sleep Studies A sleep study (polysomnogram) is a series of tests done while you are sleeping. It can show how well you sleep. This can help your health care provider diagnose a sleep disorder and show how severe your sleep disorder is. A sleep study may lead to treatment that will help you sleep better and prevent other medical problems caused by poor sleep. If you have a sleep disorder, you may also be at risk for:  Sleep-related accidents.  High blood pressure.  Heart disease.  Stroke.  Other medical conditions.  Sleep disorders are common. Your health care provider may suspect a sleep disorder if you:  Have loud snoring most nights.  Have brief periods when you stop breathing at night.  Feel sleepy on most days.  Fall asleep suddenly during the day.  Have trouble falling asleep or staying asleep.  Feel like you need to move your legs when trying to fall asleep.  Have dreams that seem very real shortly after falling asleep.  Feel like you cannot move when you first wake up.  Which tests will I need  to have? Most sleep studies last all night and include these tests:  Recordings of your brain activity.  Recordings of your eye movements.  Recording of your heart rate and rhythm.  Blood pressure readings.  Readings of the amount of oxygen in your blood.  Measurements of your chest and belly movement as you breathe during sleep.  If you have signs of the sleep disorder called sleep apnea during your test, you may get a mask to wear for the second half of the night.  The mask provides continuous positive airway pressure (CPAP). This may improve sleep apnea significantly.  You will then have all tests done again with the mask in place to see if your measurements and recordings change.  How are sleep studies done? Most sleep studies are done over one full night of sleep.  You will arrive at the study center in the evening and can go home in the morning.  Bring your pajamas and toothbrush.  Do not have caffeine on the day of your sleep study.  Your health care provider will let you know if you need to stop taking any of your regular medicines before the test.  To do the tests included in a polysomnogram, you will have:  Round, sticky patches with sensors attached to recording wires (electrodes) placed on your scalp, face, chest, and limbs.  Wires from all the electrodes and sensors run from your bed to a computer. The wires can be taken off and put back on  if you need to get out of bed to go to the bathroom.  A sensor placed over your nose to measure airflow.  A finger clip put on one finger to measure your blood oxygen level.  A belt around your belly and a belt around your chest to measure breathing movements.  Where are sleep studies done? Sleep studies are done at sleep centers. A sleep center may be inside a hospital, office, or clinic. The room where you have the study may look like a hospital room or a hotel room. The health care providers doing the study may come in  and out of the room during the study. Most of the time, they will be in another room monitoring your test. How is information from sleep studies helpful? A polysomnogram can be used along with your medical history and a physical exam to diagnose conditions, such as:  Sleep apnea.  Restless legs syndrome.  Sleep-related seizure disorders.  Sleep-related movement disorders.  A medical doctor who specializes in sleep will evaluate your sleep study. The specialist will share the results with your primary health care provider. Treatments based on your sleep study may include:  Improving your sleep habits (sleep hygiene).  Wearing a CPAP mask.  Wearing an oral device at night to improve breathing and reduce snoring.  Taking medicine for: ? Restless legs syndrome. ? Sleep-related seizure disorder. ? Sleep-related movement disorder.  This information is not intended to replace advice given to you by your health care provider. Make sure you discuss any questions you have with your health care provider. Document Released: 12/23/2002 Document Revised: 02/12/2016 Document Reviewed: 08/24/2013 Elsevier Interactive Patient Education  [employer].    If you need a refill on your cardiac medications before your next appointment, please call your pharmacy.

## 2017-01-24 ENCOUNTER — Telehealth: Payer: Self-pay | Admitting: *Deleted

## 2017-01-24 NOTE — Telephone Encounter (Signed)
Informed patient of upcoming home sleep study and patient understanding was verbalized. Patient understands her sleep study will be done at home. Patient understands she will be contacted by [facility] to set up her sleep test. She understands to call if [facility] does not contact her with new setup in a timely manner. She understands she will be called once confirmation has been received from her DME that she has received her new machine to schedule 10 week follow up appointment.  [facility] notified of new order in epic Last office notes sent She was grateful for the call and thanked me

## 2017-01-28 ENCOUNTER — Telehealth: Payer: Self-pay | Admitting: Physician Assistant

## 2017-01-28 NOTE — Telephone Encounter (Signed)
Pt c/o medication issue:  1. Name of Medication:hydroxyzine   2. How are you currently taking this medication (dosage and times per day)? unknown  3. Are you having a reaction (difficulty breathing--STAT)? No  4. What is your medication issue? Headache and chest pressure

## 2017-01-28 NOTE — Telephone Encounter (Signed)
Patient is scheduled for AUG 6th @ 2pm w/Dr. Moshe Cipro for HFU

## 2017-01-28 NOTE — Telephone Encounter (Signed)
Spoke with patient who is reporting she has had a severe headache since she started the Imdur.  She thinks it may be some better today.  Advised she can try to take Tylenol about 1 hour before she takes the Imdur.  Also instructed her that Imdur is a long acting and acts by dilating the blood vessels to increase the oxygen to her tissues however it is not cardiac specific and occurs all over her body including her head hence the headache.  Advised her body will adjust to these changes about side effects can last 1 to 2 weeks.  She states understanding.  She also c/o having some chest pressure today that feels like when she went to the ED recently.  The chest pressure started last night, is in the center of her chest, has remained constant.  She denies any radiation, no SOB and no N/V.  There is no difference with movement or deep breathing.  She does not feel as though she is holding onto any extra fluid in her abdomen, feet and/or ankles.   She has not eaten today because she reports not having an appetite but has taken her medications as listed.  Advised she needs to eat with her medications and because she is diabetic.    She will continue to monitor her s/s and call back if they continue.  She will report to the ED if s/s worsen or change.

## 2017-01-30 ENCOUNTER — Other Ambulatory Visit: Payer: Medicare Other | Admitting: *Deleted

## 2017-01-30 ENCOUNTER — Ambulatory Visit
Admission: RE | Admit: 2017-01-30 | Discharge: 2017-01-30 | Disposition: A | Discharge status: Home / Self Care | Payer: Medicare Other | Source: Ambulatory Visit | Attending: Neurosurgery | Admitting: Neurosurgery

## 2017-01-30 DIAGNOSIS — N183 Chronic kidney disease, stage 3 unspecified: Secondary | ICD-10-CM

## 2017-01-30 DIAGNOSIS — M50222 Other cervical disc displacement at C5-C6 level: Secondary | ICD-10-CM | POA: Diagnosis not present

## 2017-01-30 DIAGNOSIS — D3611 Benign neoplasm of peripheral nerves and autonomic nervous system of face, head, and neck: Secondary | ICD-10-CM

## 2017-01-30 MED ORDER — GADOBENATE DIMEGLUMINE 529 MG/ML IV SOLN: 20.0000 mL | Freq: Once | INTRAVENOUS | Status: DC | PRN

## 2017-01-31 LAB — BASIC METABOLIC PANEL
BUN/Creatinine Ratio: 16 (ref 12–28)
BUN: 17 mg/dL (ref 8–27)
CALCIUM: 9.3 mg/dL (ref 8.7–10.3)
CO2: 20 mmol/L (ref 20–29)
CREATININE: 1.07 mg/dL — AB (ref 0.57–1.00)
Chloride: 102 mmol/L (ref 96–106)
GFR calc Af Amer: 61 mL/min/{1.73_m2} (ref >59)
GFR, EST NON AFRICAN AMERICAN: 53 mL/min/{1.73_m2} — AB (ref >59)
Glucose: 119 mg/dL — ABNORMAL HIGH (ref 65–99)
Potassium: 4.5 mmol/L (ref 3.5–5.2)
SODIUM: 141 mmol/L (ref 134–144)

## 2017-02-01 ENCOUNTER — Encounter: Payer: Self-pay | Admitting: Cardiology

## 2017-02-01 DIAGNOSIS — G4733 Obstructive sleep apnea (adult) (pediatric): Secondary | ICD-10-CM | POA: Diagnosis not present

## 2017-02-04 ENCOUNTER — Ambulatory Visit (INDEPENDENT_AMBULATORY_CARE_PROVIDER_SITE_OTHER): Payer: Medicare Other | Admitting: Family Medicine

## 2017-02-04 ENCOUNTER — Encounter: Payer: Self-pay | Admitting: Family Medicine

## 2017-02-04 ENCOUNTER — Ambulatory Visit (INDEPENDENT_AMBULATORY_CARE_PROVIDER_SITE_OTHER): Payer: Medicare Other

## 2017-02-04 VITALS — BP 128/82 | HR 84 | Resp 16 | Ht 66.0 in | Wt 226.0 lb

## 2017-02-04 DIAGNOSIS — G8929 Other chronic pain: Secondary | ICD-10-CM | POA: Diagnosis not present

## 2017-02-04 DIAGNOSIS — E1121 Type 2 diabetes mellitus with diabetic nephropathy: Secondary | ICD-10-CM | POA: Diagnosis not present

## 2017-02-04 DIAGNOSIS — I10 Essential (primary) hypertension: Secondary | ICD-10-CM

## 2017-02-04 DIAGNOSIS — Z09 Encounter for follow-up examination after completed treatment for conditions other than malignant neoplasm: Secondary | ICD-10-CM

## 2017-02-04 DIAGNOSIS — I272 Pulmonary hypertension, unspecified: Secondary | ICD-10-CM

## 2017-02-04 DIAGNOSIS — M542 Cervicalgia: Secondary | ICD-10-CM | POA: Diagnosis not present

## 2017-02-04 LAB — POCT INR: INR: 2.3

## 2017-02-04 NOTE — Patient Instructions (Addendum)
F/U in October as before   , call if you need me sooner. Thankful heart has checked out good.   Keep appt with lung Doc, check on sleep study and pls sched mammogram  Fasting labs 1 week before next visit

## 2017-02-04 NOTE — Progress Notes (Signed)
Stephanie Singleton     MRN: [id number]      DOB: 1945-10-30   HPI Stephanie Singleton is here for follow up from hospitalization at [facility] from  July 24 to 25 2018 admitted with chest pain, no ACS diagnosis at discharge, but states that her blood pressure was markedly elevated at the time of hospitalization. C/o increased and on going neck and RUE pain, has upcoming apt appt wit her neurosurgeon , had recent rept MRI ROS See HPI  Denies recent fever or chills. Denies sinus pressure, nasal congestion, ear pain or sore throat. Denies chest congestion, productive cough or wheezing. Denies  Any current chest pains,PND, orthopnea, palpitations and leg swelling Denies abdominal pain, nausea, vomiting,diarrhea or constipation.   Denies dysuria, frequency, hesitancy or incontinence. Increased neck and RUE  Pain and limitation in mobility. Denies headaches, seizures, numbness, or tingling. Denies depression, anxiety or insomnia. Denies skin break down or rash.   PE  BP 140/88   Pulse 84   Resp 16   Ht 5\' 6"  (1.676 m)   Wt 226 lb (102.5 kg)   SpO2 96%   BMI 36.48 kg/m   Patient alert and oriented and in no cardiopulmonary distress.  HEENT: Neck decreased ROM no JVD, no mass.  Chest: Clear to auscultation bilaterally.  CVS: S1, S2 no murmurs, no S3.Regular rate.  ABD: Soft non tender.   Ext: No edema  MS: Adequate ROM spine, shoulders, hips and knees.  Skin: Intact, no ulcerations or rash noted.  Psych: Good eye contact, normal affect. Memory intact not anxious or depressed appearing.  CNS: CN 2-12 intact, power,  normal throughout.no focal deficits noted.   [facility] discharge follow-up Denies any current chest discomfort , somewhat disappointed that cardiac cath not done, I assured her that if I were indicated it would have been done, and that while she was being moinitored there was no indication that she was having a heart attack  Hypertension goal  BP (blood pressure) < 130/80 Controlled, no change in medication DASH diet and commitment to daily physical activity for a minimum of 30 minutes discussed and encouraged, as a part of hypertension management. The importance of attaining a healthy weight is also discussed.  BP/Weight 02/04/2017 01/23/2017 12/18/2016 12/04/2016 09/04/2016 08/07/2016 03/02/5175  Systolic BP 160 737 106 269 485 462 703  Diastolic BP 82 70 74 78 78 82 84  Wt. (Lbs) 226 228 227 224 226 223.13 -  BMI 36.48 36.8 36.64 36.15 36.48 36.01 -       Type 2 diabetes with nephropathy ([place]) Stephanie Singleton is reminded of the importance of commitment to daily physical activity for 30 minutes or more, as able and the need to limit carbohydrate intake to 30 to 60 grams per meal to help with blood sugar control.   The need to take medication as prescribed, test blood sugar as directed, and to call between visits if there is a concern that blood sugar is uncontrolled is also discussed.   Stephanie Singleton is reminded of the importance of daily foot exam, annual eye examination, and good blood sugar, blood pressure and cholesterol control.  Diabetic Labs Latest Ref Rng & Units 01/30/2017 10/31/2016 06/27/2016 12/14/2015 11/22/2015  HbA1c 4.8 - 5.6 % - 6.8(H) 7.0(H) 6.6(H) -  Microalbumin Not estab mg/dL - - - - 0.4  Micro/Creat Ratio <30 mcg/mg creat - - - - 3  Chol <200 mg/dL - - 182 177 -  HDL >50 mg/dL - -  46(L) 43(L) -  Calc LDL <100 mg/dL - - 81 93 -  Triglycerides <150 mg/dL - - 275(H) 205(H) -  Creatinine 0.57 - 1.00 mg/dL 1.07(H) 1.54(H) 1.55(H) 1.18(H) -   BP/Weight 02/04/2017 01/23/2017 12/18/2016 12/04/2016 09/04/2016 08/07/2016 1/60/7371  Systolic BP 062 694 854 627 035 009 381  Diastolic BP 82 70 74 78 78 82 84  Wt. (Lbs) 226 228 227 224 226 223.13 -  BMI 36.48 36.8 36.64 36.15 36.48 36.01 -   Foot/eye exam completion dates Latest Ref Rng & Units 06/27/2016 07/19/2015  Eye Exam No Retinopathy - Retinopathy(A)  Foot Form Completion -  Done -      Controlled, no change in medication Updated lab needed at/ before next visit.   Neck pain, chronic Worsening, is currently being re evaluated by neurosurgery

## 2017-02-07 ENCOUNTER — Telehealth: Payer: Self-pay | Admitting: *Deleted

## 2017-02-07 NOTE — Telephone Encounter (Signed)
Informed patient of sleep study results and patient understanding was verbalized. Patient understands the baseline sleep study showed no OSA. Patient understands Dr Radford Pax recommends an in lab sleep study. Patient has declined the in lab study at this time and states she will contact our office at a latter date if she decides to move forward with the in lab study.  Patient was grateful and thanked me for the call

## 2017-02-07 NOTE — Telephone Encounter (Signed)
-----   Message from Sueanne Margarita, MD sent at 02/06/2017  1:25 PM EDT ----- No significant OSA on baseline home sleep study - please set up for inlab sleep study

## 2017-02-09 ENCOUNTER — Other Ambulatory Visit: Payer: Self-pay | Admitting: Family Medicine

## 2017-02-09 DIAGNOSIS — Z09 Encounter for follow-up examination after completed treatment for conditions other than malignant neoplasm: Secondary | ICD-10-CM | POA: Insufficient documentation

## 2017-02-09 NOTE — Assessment & Plan Note (Signed)
Denies any current chest discomfort , somewhat disappointed that cardiac cath not done, I assured her that if I were indicated it would have been done, and that while she was being moinitored there was no indication that she was having a heart attack

## 2017-02-09 NOTE — Assessment & Plan Note (Signed)
Controlled, no change in medication DASH diet and commitment to daily physical activity for a minimum of 30 minutes discussed and encouraged, as a part of hypertension management. The importance of attaining a healthy weight is also discussed.  BP/Weight 02/04/2017 01/23/2017 12/18/2016 12/04/2016 09/04/2016 08/07/2016 9/93/7169  Systolic BP 678 938 101 751 025 852 778  Diastolic BP 82 70 74 78 78 82 84  Wt. (Lbs) 226 228 227 224 226 223.13 -  BMI 36.48 36.8 36.64 36.15 36.48 36.01 -

## 2017-02-09 NOTE — Assessment & Plan Note (Signed)
Stephanie Singleton is reminded of the importance of commitment to daily physical activity for 30 minutes or more, as able and the need to limit carbohydrate intake to 30 to 60 grams per meal to help with blood sugar control.   The need to take medication as prescribed, test blood sugar as directed, and to call between visits if there is a concern that blood sugar is uncontrolled is also discussed.   Stephanie Singleton is reminded of the importance of daily foot exam, annual eye examination, and good blood sugar, blood pressure and cholesterol control.  Diabetic Labs Latest Ref Rng & Units 01/30/2017 10/31/2016 06/27/2016 12/14/2015 11/22/2015  HbA1c 4.8 - 5.6 % - 6.8(H) 7.0(H) 6.6(H) -  Microalbumin Not estab mg/dL - - - - 0.4  Micro/Creat Ratio <30 mcg/mg creat - - - - 3  Chol <200 mg/dL - - 182 177 -  HDL >50 mg/dL - - 46(L) 43(L) -  Calc LDL <100 mg/dL - - 81 93 -  Triglycerides <150 mg/dL - - 275(H) 205(H) -  Creatinine 0.57 - 1.00 mg/dL 1.07(H) 1.54(H) 1.55(H) 1.18(H) -   BP/Weight 02/04/2017 01/23/2017 12/18/2016 12/04/2016 09/04/2016 08/07/2016 7/97/2820  Systolic BP 601 561 537 943 276 147 092  Diastolic BP 82 70 74 78 78 82 84  Wt. (Lbs) 226 228 227 224 226 223.13 -  BMI 36.48 36.8 36.64 36.15 36.48 36.01 -   Foot/eye exam completion dates Latest Ref Rng & Units 06/27/2016 07/19/2015  Eye Exam No Retinopathy - Retinopathy(A)  Foot Form Completion - Done -      Controlled, no change in medication Updated lab needed at/ before next visit.

## 2017-02-09 NOTE — Assessment & Plan Note (Signed)
Worsening, is currently being re evaluated by neurosurgery

## 2017-02-11 NOTE — Telephone Encounter (Signed)
Note    Informed patient of sleep study results and patient understanding was verbalized. Patient understands the baseline sleep study showed no OSA. Patient understands Dr Radford Pax recommends an in lab sleep study. Patient has declined the in lab study at this time and states she will contact our office at a latter date if she decides to move forward with the in lab study.  Patient was grateful and thanked me for the call

## 2017-02-25 ENCOUNTER — Telehealth: Payer: Self-pay | Admitting: Physician Assistant

## 2017-02-25 MED ORDER — HYDROCHLOROTHIAZIDE 12.5 MG PO TABS: 12.5000 mg | ORAL_TABLET | Freq: Every day | ORAL | 5 refills | Status: DC

## 2017-02-25 NOTE — Telephone Encounter (Signed)
New message    *STAT* If patient is at the pharmacy, call can be transferred to refill team.   1. Which medications need to be refilled? (please list name of each medication and dose if known) hydrochlorothiazide (HYDRODIURIL) 12.5 MG tablet  2. Which pharmacy/location (including street and city if local pharmacy) is medication to be sent to? [facility] in [place]  3. Do they need a 30 day or 90 day supply? 90 day supply

## 2017-02-26 DIAGNOSIS — D3611 Benign neoplasm of peripheral nerves and autonomic nervous system of face, head, and neck: Secondary | ICD-10-CM | POA: Diagnosis not present

## 2017-03-05 ENCOUNTER — Institutional Professional Consult (permissible substitution): Payer: Medicare Other | Admitting: Internal Medicine

## 2017-03-27 ENCOUNTER — Ambulatory Visit (INDEPENDENT_AMBULATORY_CARE_PROVIDER_SITE_OTHER): Payer: Medicare Other | Admitting: *Deleted

## 2017-03-27 DIAGNOSIS — I272 Pulmonary hypertension, unspecified: Secondary | ICD-10-CM

## 2017-03-27 DIAGNOSIS — Z5181 Encounter for therapeutic drug level monitoring: Secondary | ICD-10-CM | POA: Diagnosis not present

## 2017-03-27 LAB — POCT INR: INR: 1.8

## 2017-04-08 ENCOUNTER — Ambulatory Visit (INDEPENDENT_AMBULATORY_CARE_PROVIDER_SITE_OTHER): Payer: Medicare Other | Admitting: Family Medicine

## 2017-04-08 ENCOUNTER — Encounter: Payer: Self-pay | Admitting: Family Medicine

## 2017-04-08 VITALS — BP 130/80 | HR 76 | Temp 97.0°F | Resp 16 | Ht 66.0 in | Wt 229.0 lb

## 2017-04-08 DIAGNOSIS — E785 Hyperlipidemia, unspecified: Secondary | ICD-10-CM | POA: Diagnosis not present

## 2017-04-08 DIAGNOSIS — Z2821 Immunization not carried out because of patient refusal: Secondary | ICD-10-CM | POA: Diagnosis not present

## 2017-04-08 DIAGNOSIS — M542 Cervicalgia: Secondary | ICD-10-CM

## 2017-04-08 DIAGNOSIS — L299 Pruritus, unspecified: Secondary | ICD-10-CM

## 2017-04-08 DIAGNOSIS — E669 Obesity, unspecified: Secondary | ICD-10-CM | POA: Diagnosis not present

## 2017-04-08 DIAGNOSIS — I10 Essential (primary) hypertension: Secondary | ICD-10-CM

## 2017-04-08 DIAGNOSIS — G8929 Other chronic pain: Secondary | ICD-10-CM

## 2017-04-08 DIAGNOSIS — E1121 Type 2 diabetes mellitus with diabetic nephropathy: Secondary | ICD-10-CM | POA: Diagnosis not present

## 2017-04-08 NOTE — Patient Instructions (Addendum)
Wellness with nurse mid February.   MD f/u in 6 months  PLease get fasting labs ordered next week in [place] at the [facility] office,, they are past due  You have promised, please follow through and get your mammogram tHIS year  It is important that you exercise regularly at least 30 minutes 5 times a week. If you develop chest pain, have severe difficulty breathing, or feel very tired, stop exercising immediately and seek medical attention   Reconsider immunization  Thank you  for choosing [facility]. We consider it a privelige to serve you.  Delivering excellent health care in a caring and  compassionate way is our goal.  Partnering with you,  so that together we can achieve this goal is our strategy.

## 2017-04-11 ENCOUNTER — Other Ambulatory Visit: Payer: Self-pay | Admitting: Family Medicine

## 2017-04-11 NOTE — Telephone Encounter (Signed)
Seen 10 8 18 

## 2017-04-13 ENCOUNTER — Encounter: Payer: Self-pay | Admitting: Family Medicine

## 2017-04-13 NOTE — Assessment & Plan Note (Signed)
With hydroxyzine daily

## 2017-04-13 NOTE — Assessment & Plan Note (Signed)
Stephanie Singleton is reminded of the importance of commitment to daily physical activity for 30 minutes or more, as able and the need to limit carbohydrate intake to 30 to 60 grams per meal to help with blood sugar control.   The need to take medication as prescribed, test blood sugar as directed, and to call between visits if there is a concern that blood sugar is uncontrolled is also discussed.   Stephanie Singleton is reminded of the importance of daily foot exam, annual eye examination, and good blood sugar, blood pressure and cholesterol control. Updated lab needed    Diabetic Labs Latest Ref Rng & Units 01/30/2017 10/31/2016 06/27/2016 12/14/2015 11/22/2015  HbA1c 4.8 - 5.6 % - 6.8(H) 7.0(H) 6.6(H) -  Microalbumin Not estab mg/dL - - - - 0.4  Micro/Creat Ratio <30 mcg/mg creat - - - - 3  Chol <200 mg/dL - - 182 177 -  HDL >50 mg/dL - - 46(L) 43(L) -  Calc LDL <100 mg/dL - - 81 93 -  Triglycerides <150 mg/dL - - 275(H) 205(H) -  Creatinine 0.57 - 1.00 mg/dL 1.07(H) 1.54(H) 1.55(H) 1.18(H) -   BP/Weight 04/08/2017 02/04/2017 01/23/2017 12/18/2016 12/04/2016 8/0/0349 07/08/9148  Systolic BP 569 794 801 655 374 827 078  Diastolic BP 80 82 70 74 78 78 82  Wt. (Lbs) 229.04 226 228 227 224 226 223.13  BMI 36.97 36.48 36.8 36.64 36.15 36.48 36.01   Foot/eye exam completion dates Latest Ref Rng & Units 06/27/2016 07/19/2015  Eye Exam No Retinopathy - Retinopathy(A)  Foot Form Completion - Done -

## 2017-04-13 NOTE — Assessment & Plan Note (Signed)
Hyperlipidemia:Low fat diet discussed and encouraged.   Lipid Panel  Lab Results  Component Value Date   CHOL 182 06/27/2016   HDL 46 (L) 06/27/2016   LDLCALC 81 06/27/2016   TRIG 275 (H) 06/27/2016   CHOLHDL 4.0 06/27/2016   Uncontorlled, updated lab next week

## 2017-04-13 NOTE — Assessment & Plan Note (Signed)
By neurosurgeon on chronic pain meds

## 2017-04-13 NOTE — Assessment & Plan Note (Signed)
Offered and refuses influenza and pneumonia vaccines

## 2017-04-13 NOTE — Progress Notes (Signed)
Stephanie Singleton     MRN: [id number]      DOB: [birth year]   HPI Stephanie Singleton is here for follow up and re-evaluation of chronic medical conditions, medication management and review of any available recent lab and radiology data.  Preventive health is updated, specifically  Cancer screening and Immunization.   Questions or concerns regarding consultations or procedures which the PT has had in the interim are  Addressed. Has seen neurosurgeon and is on chronic pain management for neck pain The PT denies any adverse reactions to current medications since the last visit.  There are no new concerns.  There are no specific complaints  Denies polyuria, polydipsia, blurred vision , or hypoglycemic episodes.   ROS Denies recent fever or chills. Denies sinus pressure, nasal congestion, ear pain or sore throat. Denies chest congestion, productive cough or wheezing. Denies chest pains, palpitations and leg swelling Denies abdominal pain, nausea, vomiting,diarrhea or constipation.   Denies dysuria, frequency, hesitancy or incontinence. Denies uncontrolled  joint pain, swelling and limitation in mobility. Denies headaches, seizures, numbness, or tingling. Denies depression, anxiety or insomnia. Denies skin break down or rash.   PE  BP 130/80 (BP Location: Left Arm, Patient Position: Sitting, Cuff Size: Normal)   Pulse 76   Temp (!) 97 F (36.1 C) (Temporal)   Resp 16   Ht 5\' 6"  (1.676 m)   Wt 229 lb 0.6 oz (103.9 kg)   SpO2 99%   BMI 36.97 kg/m   Patient alert and oriented and in no cardiopulmonary distress.  HEENT: No facial asymmetry, EOMI,   oropharynx pink and moist.  Neck supple no JVD, no mass.  Chest: Clear to auscultation bilaterally.  CVS: S1, S2 no murmurs, no S3.Regular rate.  ABD: Soft non tender.   Ext: No edema  MS: Adequate ROM spine, shoulders, hips and knees.  Skin: Intact, no ulcerations or rash noted.  Psych: Good eye contact, normal affect. Memory  intact not anxious or depressed appearing.  CNS: CN 2-12 intact, power,  normal throughout.no focal deficits noted.   Assessment & Plan  Hypertension goal BP (blood pressure) < 130/80 Controlled, no change in medication DASH diet and commitment to daily physical activity for a minimum of 30 minutes discussed and encouraged, as a part of hypertension management. The importance of attaining a healthy weight is also discussed.  BP/Weight 04/08/2017 02/04/2017 01/23/2017 12/18/2016 12/04/2016 12/07/1243 8/0/9983  Systolic BP 382 505 397 673 419 379 024  Diastolic BP 80 82 70 74 78 78 82  Wt. (Lbs) 229.04 226 228 227 224 226 223.13  BMI 36.97 36.48 36.8 36.64 36.15 36.48 36.01       Type 2 diabetes with nephropathy ([place]) Stephanie Singleton is reminded of the importance of commitment to daily physical activity for 30 minutes or more, as able and the need to limit carbohydrate intake to 30 to 60 grams per meal to help with blood sugar control.   The need to take medication as prescribed, test blood sugar as directed, and to call between visits if there is a concern that blood sugar is uncontrolled is also discussed.   Stephanie Singleton is reminded of the importance of daily foot exam, annual eye examination, and good blood sugar, blood pressure and cholesterol control. Updated lab needed    Diabetic Labs Latest Ref Rng & Units 01/30/2017 10/31/2016 06/27/2016 12/14/2015 11/22/2015  HbA1c 4.8 - 5.6 % - 6.8(H) 7.0(H) 6.6(H) -  Microalbumin Not estab mg/dL - - - - 0.4  Micro/Creat Ratio <30 mcg/mg creat - - - - 3  Chol <200 mg/dL - - 182 177 -  HDL >50 mg/dL - - 46(L) 43(L) -  Calc LDL <100 mg/dL - - 81 93 -  Triglycerides <150 mg/dL - - 275(H) 205(H) -  Creatinine 0.57 - 1.00 mg/dL 1.07(H) 1.54(H) 1.55(H) 1.18(H) -   BP/Weight 04/08/2017 02/04/2017 01/23/2017 12/18/2016 12/04/2016 11/04/3873 12/04/3327  Systolic BP 518 841 660 630 160 109 323  Diastolic BP 80 82 70 74 78 78 82  Wt. (Lbs) 229.04 226 228 227 224 226  223.13  BMI 36.97 36.48 36.8 36.64 36.15 36.48 36.01   Foot/eye exam completion dates Latest Ref Rng & Units 06/27/2016 07/19/2015  Eye Exam No Retinopathy - Retinopathy(A)  Foot Form Completion - Done -        Neck pain, chronic By neurosurgeon on chronic pain meds  Obesity (BMI 30.0-34.9) Deteriorated. Patient re-educated about  the importance of commitment to a  minimum of 150 minutes of exercise per week.  The importance of healthy food choices with portion control discussed. Encouraged to start a food diary, count calories and to consider  joining a support group. Sample diet sheets offered. Goals set by the patient for the next several months.   Weight /BMI 04/08/2017 02/04/2017 01/23/2017  WEIGHT 229 lb 0.6 oz 226 lb 228 lb  HEIGHT 5\' 6"  5\' 6"  5\' 6"   BMI 36.97 kg/m2 36.48 kg/m2 36.8 kg/m2      Vaccination not carried out because of patient refusal Offered and refuses influenza and pneumonia vaccines  Hyperlipidemia LDL goal <100 Hyperlipidemia:Low fat diet discussed and encouraged.   Lipid Panel  Lab Results  Component Value Date   CHOL 182 06/27/2016   HDL 46 (L) 06/27/2016   LDLCALC 81 06/27/2016   TRIG 275 (H) 06/27/2016   CHOLHDL 4.0 06/27/2016   Uncontorlled, updated lab next week    Pruritus With hydroxyzine daily

## 2017-04-13 NOTE — Assessment & Plan Note (Signed)
Deteriorated. Patient re-educated about  the importance of commitment to a  minimum of 150 minutes of exercise per week.  The importance of healthy food choices with portion control discussed. Encouraged to start a food diary, count calories and to consider  joining a support group. Sample diet sheets offered. Goals set by the patient for the next several months.   Weight /BMI 04/08/2017 02/04/2017 01/23/2017  WEIGHT 229 lb 0.6 oz 226 lb 228 lb  HEIGHT 5\' 6"  5\' 6"  5\' 6"   BMI 36.97 kg/m2 36.48 kg/m2 36.8 kg/m2

## 2017-04-13 NOTE — Assessment & Plan Note (Signed)
Controlled, no change in medication DASH diet and commitment to daily physical activity for a minimum of 30 minutes discussed and encouraged, as a part of hypertension management. The importance of attaining a healthy weight is also discussed.  BP/Weight 04/08/2017 02/04/2017 01/23/2017 12/18/2016 12/04/2016 07/06/5206 0/08/2334  Systolic BP 122 449 753 005 110 211 173  Diastolic BP 80 82 70 74 78 78 82  Wt. (Lbs) 229.04 226 228 227 224 226 223.13  BMI 36.97 36.48 36.8 36.64 36.15 36.48 36.01

## 2017-04-16 ENCOUNTER — Other Ambulatory Visit: Payer: Self-pay

## 2017-04-16 ENCOUNTER — Other Ambulatory Visit: Payer: Self-pay | Admitting: Family Medicine

## 2017-04-16 DIAGNOSIS — R921 Mammographic calcification found on diagnostic imaging of breast: Secondary | ICD-10-CM

## 2017-04-17 ENCOUNTER — Encounter: Payer: Self-pay | Admitting: Cardiology

## 2017-04-17 ENCOUNTER — Ambulatory Visit (INDEPENDENT_AMBULATORY_CARE_PROVIDER_SITE_OTHER): Payer: Medicare Other | Admitting: Pharmacist

## 2017-04-17 ENCOUNTER — Ambulatory Visit (INDEPENDENT_AMBULATORY_CARE_PROVIDER_SITE_OTHER): Payer: Medicare Other | Admitting: Cardiology

## 2017-04-17 VITALS — BP 132/88 | HR 60 | Ht 66.0 in | Wt 229.0 lb

## 2017-04-17 DIAGNOSIS — I10 Essential (primary) hypertension: Secondary | ICD-10-CM

## 2017-04-17 DIAGNOSIS — I272 Pulmonary hypertension, unspecified: Secondary | ICD-10-CM | POA: Diagnosis not present

## 2017-04-17 DIAGNOSIS — Z86711 Personal history of pulmonary embolism: Secondary | ICD-10-CM

## 2017-04-17 DIAGNOSIS — I119 Hypertensive heart disease without heart failure: Secondary | ICD-10-CM

## 2017-04-17 DIAGNOSIS — E1121 Type 2 diabetes mellitus with diabetic nephropathy: Secondary | ICD-10-CM | POA: Diagnosis not present

## 2017-04-17 DIAGNOSIS — R202 Paresthesia of skin: Secondary | ICD-10-CM

## 2017-04-17 LAB — POCT INR: INR: 3.1

## 2017-04-17 MED ORDER — PREGABALIN 75 MG PO CAPS: 75.0000 mg | ORAL_CAPSULE | Freq: Two times a day (BID) | ORAL | 2 refills | Status: DC

## 2017-04-17 MED ORDER — WARFARIN SODIUM 2.5 MG PO TABS: ORAL_TABLET | ORAL | 3 refills | Status: DC

## 2017-04-17 NOTE — Patient Instructions (Signed)
Medication Instructions:   START TAKING LYRICA 75 MG BY MOUTH TWICE DAILY     Follow-Up:  Your physician wants you to follow-up in: [place] will receive a reminder letter in the mail two months in advance. If you don't receive a letter, please call our office to schedule the follow-up appointment.        If you need a refill on your cardiac medications before your next appointment, please call your pharmacy.

## 2017-04-17 NOTE — Progress Notes (Signed)
Cardiology Office Note    Date:  04/17/2017   ID:  Stephanie Singleton, DOB Oct 01, 1945, MRN [id number]  PCP:  Stephanie Helper, MD  Cardiologist:  Stephanie Dawley, MD   Chief complain: 1 year follow up  History of Present Illness:  Stephanie Singleton is a 71 y.o. female, previously followed by Stephanie Singleton, who just transferred to [facility]. 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 She hasn't been seen in 1 year.  Today she states that her LE edema has been controlled by torsemide. She has been experiencing LLE pain while walking short distances. She underwent cervical fusion surgery and is experiencing sharp shooting  pain in her upper and lower extremities. Denies chest pain or SOB. No orthopnea, PND, no palpitations or syncope.  04/17/17 -  1 year follow, She states that she has been feeling well. She denies any chest pain or shortness of breath. She has occasional dyspnea on exertion but that hasn't changed in several years. She denies any lower extremity edema orthopnea or paroxysmal nocturnal dyspnea. She is complaining of pain in her lower extremities, pursed yes and legs giving out on her when she is walking. She previously tried gabapentin that didn't help her.   Past Medical History:  Diagnosis Date  . Chronic diastolic CHF (congestive heart failure) ([place])   . CKD (chronic kidney disease), stage III ([place])   . Diabetes mellitus DX X 1 YR   USE METFORMIN ACCORDING TO CBG RESULT  . Dilatation of aorta (HCC)   . Embolism - blood clot 2004   Pulmonary .on coumadin  . GERD (gastroesophageal reflux disease)    ON PREVACID  . HTN (hypertension)    EKG 10/23 IN EPIC  . Hyperlipidemia   . Hypertension    CARDIOLOGY VISIT.CARDIAC CLEARANCE.LOVENOX INSTRUCTIONS.  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 OSA (obstructive sleep apnea) DX 8  YRS AGO.DOES NOT WEAR MASK  . Pulmonary embolism ([place])    a. 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  . Tumor of soft tissue of neck     Past Surgical History:  Procedure Laterality Date  . CERVICAL FUSION      Current Medications: Outpatient Medications Prior to Visit  Medication Sig Dispense Refill  . glucose blood (ACCU-CHEK AVIVA PLUS) test strip TEST ONCE DAILY dx E11.9 100 each 3  . hydrochlorothiazide (HYDRODIURIL) 12.5 MG tablet Take 1 tablet (12.5 mg total) by mouth daily. 30 tablet 5  . HYDROcodone-acetaminophen (NORCO/VICODIN) 5-325 MG tablet Take 1 tablet by mouth 2 (two) times daily as needed (pain).    . hydrOXYzine (ATARAX/VISTARIL) 50 MG tablet TAKE 1 TABLET (50 MG TOTAL) BY MOUTH AT BEDTIME. 30 tablet 1  . isosorbide mononitrate (IMDUR) 30 MG 24 hr tablet Take 1 tablet (30 mg total) by mouth daily. 90 tablet 3  . linagliptin (TRADJENTA) 5 MG TABS tablet Take 1 tablet (5 mg total) by mouth daily. 90 tablet 1  . losartan (COZAAR) 100 MG tablet TAKE 1 TABLET (100 MG TOTAL) BY MOUTH DAILY. 90 tablet 1  . pantoprazole (PROTONIX) 40 MG tablet TAKE 1 TABLET (40 MG TOTAL) BY MOUTH DAILY. 90 tablet 0  . potassium chloride SA (K-DUR,KLOR-CON) 20 MEQ tablet Take 1 tablet (20 mEq total) by mouth 2 (two) times a week. *Take only on the days that Demadex (TORSEMIDE) is taken* 30  tablet 2  . pravastatin (PRAVACHOL) 20 MG tablet TAKE 1 TABLET BY MOUTH DAILY 90 tablet 1  . torsemide (DEMADEX) 20 MG tablet as directed. TAKE 1 TABLET BY MOUTH 2 TIMES A WEEK AS NEEDED FOR SWELLING    . warfarin (COUMADIN) 2.5 MG tablet Take as directed by coumadin clinic 35 tablet 3   No facility-administered medications prior to visit.      Allergies:   Aspirin and Lisinopril   Social History   Social History  . Marital status: Divorced    Spouse name: N/A  . Number of children: 67  . Years of education: N/A   Occupational History  .  Disabled     [employer]   Social History Main Topics  .  Smoking status: Former Smoker    Packs/day: 0.25    Years: 20.00    Quit date: 08/07/1996  . Smokeless tobacco: Never Used  . Alcohol use No  . Drug use: No  . Sexual activity: Yes   Other Topics Concern  . None   Social History Narrative  . None     Family History:  The patient's family history includes Bladder Cancer (age of onset: 43) in her sister; Breast cancer (age of onset: 33) in her sister; Diabetes in her daughter, father, and son; Heart disease in her brother and mother; Hypertension in her mother; Peripheral vascular disease in her brother and father; Stroke in her mother.   ROS:   Please see the history of present illness.    ROS All other systems reviewed and are negative.   PHYSICAL EXAM:   VS:  BP 132/88   Pulse 60   Ht _0  (1.676 m)   Wt 229 lb (103.9 kg)   SpO2 99%   BMI 36.96 kg/m    GEN: Well nourished, well developed, in no acute distress  HEENT: normal  Neck: no JVD, carotid bruits, or masses Cardiac: RRR; no murmurs, rubs, or gallops,no edema  Respiratory:  clear to auscultation bilaterally, normal work of breathing GI: soft, nontender, nondistended, + BS MS: no deformity or atrophy  Skin: warm and dry, no rash Neuro:  Alert and Oriented x 3, Strength and sensation are intact Psych: euthymic mood, full affect  Wt Readings from Last 3 Encounters:  04/17/17 229 lb (103.9 kg)  04/08/17 229 lb 0.6 oz (103.9 kg)  02/04/17 226 lb (102.5 kg)    Studies/Labs Reviewed:   EKG:  EKG is ordered today.  The ekg ordered today demonstrates SR, normal ECG.  Recent Labs: 06/27/2016: TSH 0.97 10/31/2016: ALT 17 12/18/2016: Hemoglobin 12.5; NT-Pro BNP 88; Platelets 330 01/30/2017: BUN 17; Creatinine, Ser 1.07; Potassium 4.5; Sodium 141   Lipid Panel    Component Value Date/Time   CHOL 182 06/27/2016 1428   TRIG 275 (H) 06/27/2016 1428   HDL 46 (L) 06/27/2016 1428   CHOLHDL 4.0 06/27/2016 1428   VLDL 55 (H) 06/27/2016 1428   LDLCALC 81 06/27/2016 1428    TTE: 02/26/2013 Left ventricle: The cavity size was mildly reduced. There was moderate focal basal hypertrophy. Systolic function was vigorous. The estimated ejection fraction was in the range of 65% to 70%. Wall motion was normal; there were no regional wall motion abnormalities. Doppler parameters are consistent with abnormal left ventricular relaxation (grade 1 diastolic dysfunction). - Aortic valve: Trileaflet; mildly calcified leaflets. No significant regurgitation. - Mitral valve: Calcified annulus. Trivial regurgitation. - Right atrium: Central venous pressure: 32m Hg (est). - Tricuspid valve: Trivial regurgitation. -  Pulmonary arteries: Systolic pressure could not be accurately estimated. - Pericardium, extracardiac: A prominent pericardial fat pad was present. Impressions:  - No prior study for comparison. Moderate basal septal hypertrophy with LVEF 85-46%, grade 1 diastolic dysfunction. Mild MAC and aortic valve calcification. Trace tricuspid regurgitation. Unable to assess PASP. Normal CVP.  ECG: SR, normal ECG   ASSESSMENT:    No diagnosis found.   PLAN:  In order of problems listed above:  1. H/o PE, pulmonary hypertension - on the most recent echo, normal RVSP, normal RV function, she is asymptomatic and was told that she'll be on lifelong warfarin. 2. HTN - controlled 3. Lower extremity pain and paresthesias - LE arterial US was normal, she has great pulses, we'll prescribe Lyrica 75 mg by mouth twice a day. 4. Acute on chronic diastolic CHF - euvolemic, refill torsemide and KCl, most recent labs in August 2018 showed normal creatinine and electrolytes.    Medication Adjustments/Labs and Tests Ordered: Current medicines are reviewed at length with the patient today.  Concerns regarding medicines are outlined above.  Medication changes, Labs and Tests ordered today are listed in the Patient Instructions below. Patient  Instructions  Medication Instructions:   START TAKING LYRICA 38 MG BY MOUTH TWICE DAILY     Follow-Up:  Your physician wants you to follow-up in: [place] will receive a reminder letter in the mail two months in advance. If you don't receive a letter, please call our office to schedule the follow-up appointment.        If you need a refill on your cardiac medications before your next appointment, please call your pharmacy.      Signed, Stephanie Dawley, MD  04/17/2017 12:48 PM    [address], [place], [place]  [address] Phone: [phone]; Fax: [phone]      Cardiology Office Note    Date:  04/17/2017   ID:  Ridhi Hoffert, DOB 06/14/1946, MRN [id number]  PCP:  Stephanie Helper, MD  Cardiologist:  Stephanie Dawley, MD   Chief complain: Establish cardiology care in [facility]  History of Present Illness:  Laquanna Veazey is a 71 y.o. female, previously followed by Stephanie Singleton, who just transferred to [facility]. 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 She hasn't been seen in 1 year.  Today she states that her LE edema has been controlled by torsemide. She has been experiencing LLE pain while walking short distances. She underwent cervical fusion surgery and is experiencing sharp shooting  pain in her upper and lower extremities. Denies chest pain or SOB. No orthopnea, PND, no palpitations or syncope.    Past Medical History:  Diagnosis Date  . Chronic diastolic CHF (congestive heart failure) ([place])   . CKD (chronic kidney disease), stage III ([place])   . Diabetes mellitus DX X 1 YR   USE METFORMIN ACCORDING TO CBG RESULT  . Dilatation of aorta (H[facility])   . Embolism - blood clot 2004   Pulmonary .on coumadin  . GERD (gastroesophageal reflux disease)    ON PREVACID  . HTN (hypertension)    EKG 10/23 IN EPIC  .  Hyperlipidemia   . Hypertension    CARDIOLOGY VISIT.CARDIAC CLEARANCE.LOVENOX INSTRUCTIONS.  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 OSA (obstructive sleep apnea) DX 8 YRS  [place].DOES NOT WEAR MASK  . Pulmonary embolism ([place])    a. 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  . Tumor of soft tissue of neck     Past Surgical History:  Procedure Laterality Date  . CERVICAL FUSION      Current Medications: Outpatient Medications Prior to Visit  Medication Sig Dispense Refill  . glucose blood (ACCU-CHEK AVIVA PLUS) test strip TEST ONCE DAILY dx E11.9 100 each 3  . hydrochlorothiazide (HYDRODIURIL) 12.5 MG tablet Take 1 tablet (12.5 mg total) by mouth daily. 30 tablet 5  . HYDROcodone-acetaminophen (NORCO/VICODIN) 5-325 MG tablet Take 1 tablet by mouth 2 (two) times daily as needed (pain).    . hydrOXYzine (ATARAX/VISTARIL) 50 MG tablet TAKE 1 TABLET (50 MG TOTAL) BY MOUTH AT BEDTIME. 30 tablet 1  . isosorbide mononitrate (IMDUR) 30 MG 24 hr tablet Take 1 tablet (30 mg total) by mouth daily. 90 tablet 3  . linagliptin (TRADJENTA) 5 MG TABS tablet Take 1 tablet (5 mg total) by mouth daily. 90 tablet 1  . losartan (COZAAR) 100 MG tablet TAKE 1 TABLET (100 MG TOTAL) BY MOUTH DAILY. 90 tablet 1  . pantoprazole (PROTONIX) 40 MG tablet TAKE 1 TABLET (40 MG TOTAL) BY MOUTH DAILY. 90 tablet 0  . potassium chloride SA (K-DUR,KLOR-CON) 20 MEQ tablet Take 1 tablet (20 mEq total) by mouth 2 (two) times a week. *Take only on the days that Demadex (TORSEMIDE) is taken* 30 tablet 2  . pravastatin (PRAVACHOL) 20 MG tablet TAKE 1 TABLET BY MOUTH DAILY 90 tablet 1  . torsemide (DEMADEX) 20 MG tablet as directed. TAKE 1 TABLET BY MOUTH 2 TIMES A WEEK AS NEEDED FOR SWELLING    . warfarin (COUMADIN) 2.5 MG tablet Take as directed by coumadin clinic 35 tablet 3   No facility-administered medications prior to visit.      Allergies:   Aspirin and  Lisinopril   Social History   Social History  . Marital status: Divorced    Spouse name: N/A  . Number of children: 60  . Years of education: N/A   Occupational History  .  Disabled     [employer]   Social History Main Topics  . Smoking status: Former Smoker    Packs/day: 0.25    Years: 20.00    Quit date: 08/07/1996  . Smokeless tobacco: Never Used  . Alcohol use No  . Drug use: No  . Sexual activity: Yes   Other Topics Concern  . None   Social History Narrative  . None     Family History:  The patient's family history includes Bladder Cancer (age of onset: 50) in her sister; Breast cancer (age of onset: 67) in her sister; Diabetes in her daughter, father, and son; Heart disease in her brother and mother; Hypertension in her mother; Peripheral vascular disease in her brother and father; Stroke in her mother.   ROS:   Please see the history of present illness.    ROS All other systems reviewed and are negative.   PHYSICAL EXAM:   VS:  BP 132/88   Pulse 60   Ht _0  (1.676 m)   Wt 229 lb (103.9 kg)   SpO2 99%   BMI 36.96 kg/m    GEN: Well nourished, well developed, in no acute distress  HEENT: normal  Neck: no JVD, carotid bruits, or masses Cardiac: RRR; no murmurs, rubs, or gallops,no edema  Respiratory:  clear to auscultation bilaterally, normal work of breathing GI:  soft, nontender, nondistended, + BS MS: no deformity or atrophy  Skin: warm and dry, no rash Neuro:  Alert and Oriented x 3, Strength and sensation are intact Psych: euthymic mood, full affect  Wt Readings from Last 3 Encounters:  04/17/17 229 lb (103.9 kg)  04/08/17 229 lb 0.6 oz (103.9 kg)  02/04/17 226 lb (102.5 kg)    Studies/Labs Reviewed:   EKG:  EKG is ordered today.  The ekg ordered today demonstrates SR, normal ECG.  Recent Labs: 06/27/2016: TSH 0.97 10/31/2016: ALT 17 12/18/2016: Hemoglobin 12.5; NT-Pro BNP 88; Platelets 330 01/30/2017: BUN 17; Creatinine, Ser 1.07; Potassium 4.5;  Sodium 141   Lipid Panel    Component Value Date/Time   CHOL 182 06/27/2016 1428   TRIG 275 (H) 06/27/2016 1428   HDL 46 (L) 06/27/2016 1428   CHOLHDL 4.0 06/27/2016 1428   VLDL 55 (H) 06/27/2016 1428   LDLCALC 81 06/27/2016 1428   TTE: 02/26/2013 Left ventricle: The cavity size was mildly reduced. There was moderate focal basal hypertrophy. Systolic function was vigorous. The estimated ejection fraction was in the range of 65% to 70%. Wall motion was normal; there were no regional wall motion abnormalities. Doppler parameters are consistent with abnormal left ventricular relaxation (grade 1 diastolic dysfunction). - Aortic valve: Trileaflet; mildly calcified leaflets. No significant regurgitation. - Mitral valve: Calcified annulus. Trivial regurgitation. - Right atrium: Central venous pressure: 56m Hg (est). - Tricuspid valve: Trivial regurgitation. - Pulmonary arteries: Systolic pressure could not be accurately estimated. - Pericardium, extracardiac: A prominent pericardial fat pad was present. Impressions:  - No prior study for comparison. Moderate basal septal hypertrophy with LVEF 678-58% grade 1 diastolic dysfunction. Mild MAC and aortic valve calcification. Trace tricuspid regurgitation. Unable to assess PASP. Normal CVP.  ECG: SR, normal ECG   ASSESSMENT:    No diagnosis found.   PLAN:  In order of problems listed above:  5. H/o PE, pulmonary hypertension - on the most recent echo, normal RVSP, normal RV function 6. 2. HTN - controlled 7. 3. Claudication - order a LLE arterial [place]. Acute on chronic diastolic CHF - euvolemic, refill torsemide and KCl    Medication Adjustments/Labs and Tests Ordered: Current medicines are reviewed at length with the patient today.  Concerns regarding medicines are outlined above.  Medication changes, Labs and Tests ordered today are listed in the Patient Instructions below. Patient Instructions   Medication Instructions:   START TAKING LYRICA 725MG BY MOUTH TWICE DAILY     Follow-Up:  Your physician wants you to follow-up in: [place] in the mail two months in advance. If you don't receive a letter, please call our office to schedule the follow-up appointment.        If you need a refill on your cardiac medications before your next appointment, please call your pharmacy.      Signed, KEna Dawley MD  04/17/2017 12:48 PM    [address]  [id number]: [phone] Fax: [phone]

## 2017-04-19 LAB — CMP14+EGFR
ALK PHOS: 81 IU/L (ref 39–117)
ALT: 13 IU/L (ref 0–32)
AST: 14 IU/L (ref 0–40)
Albumin/Globulin Ratio: 1.3 (ref 1.2–2.2)
Albumin: 3.8 g/dL (ref 3.6–4.8)
BUN / CREAT RATIO: 8 — AB (ref 12–28)
BUN: 9 mg/dL (ref 8–27)
Bilirubin Total: 0.6 mg/dL (ref 0.0–1.2)
CO2: 21 mmol/L (ref 20–29)
CREATININE: 1.07 mg/dL — AB (ref 0.57–1.00)
Calcium: 9 mg/dL (ref 8.7–10.3)
Chloride: 103 mmol/L (ref 96–106)
GFR calc Af Amer: 61 mL/min/{1.73_m2} (ref >59)
GFR, EST NON AFRICAN AMERICAN: 53 mL/min/{1.73_m2} — AB (ref >59)
Globulin, Total: 3 g/dL (ref 1.5–4.5)
Glucose: 158 mg/dL — ABNORMAL HIGH (ref 65–99)
Potassium: 3.4 mmol/L — ABNORMAL LOW (ref 3.5–5.2)
Sodium: 140 mmol/L (ref 134–144)
TOTAL PROTEIN: 6.8 g/dL (ref 6.0–8.5)

## 2017-04-19 LAB — LIPID PANEL
CHOL/HDL RATIO: 3.7 ratio (ref 0.0–4.4)
Cholesterol, Total: 150 mg/dL (ref 100–199)
HDL: 41 mg/dL (ref >39)
LDL Calculated: 85 mg/dL (ref 0–99)
TRIGLYCERIDES: 118 mg/dL (ref 0–149)
VLDL CHOLESTEROL CAL: 24 mg/dL (ref 5–40)

## 2017-04-19 LAB — HEMOGLOBIN A1C
ESTIMATED AVERAGE GLUCOSE: 146 mg/dL
HEMOGLOBIN A1C: 6.7 % — AB (ref 4.8–5.6)

## 2017-04-21 ENCOUNTER — Encounter: Payer: Self-pay | Admitting: Family Medicine

## 2017-04-23 ENCOUNTER — Other Ambulatory Visit: Payer: Self-pay | Admitting: Family Medicine

## 2017-04-25 ENCOUNTER — Ambulatory Visit
Admission: RE | Admit: 2017-04-25 | Discharge: 2017-04-25 | Disposition: A | Discharge status: Home / Self Care | Payer: Medicare Other | Source: Ambulatory Visit | Attending: Family Medicine | Admitting: Family Medicine

## 2017-04-25 DIAGNOSIS — R921 Mammographic calcification found on diagnostic imaging of breast: Secondary | ICD-10-CM | POA: Diagnosis not present

## 2017-05-08 ENCOUNTER — Ambulatory Visit (INDEPENDENT_AMBULATORY_CARE_PROVIDER_SITE_OTHER): Payer: Medicare Other

## 2017-05-08 DIAGNOSIS — Z5181 Encounter for therapeutic drug level monitoring: Secondary | ICD-10-CM

## 2017-05-08 DIAGNOSIS — I272 Pulmonary hypertension, unspecified: Secondary | ICD-10-CM | POA: Diagnosis not present

## 2017-05-08 LAB — POCT INR: INR: 1.7

## 2017-05-27 ENCOUNTER — Telehealth: Payer: Self-pay | Admitting: Family Medicine

## 2017-05-27 NOTE — Telephone Encounter (Signed)
Patient left a message on nurse line requesting a call back regarding her medications and a cough medicine.   Callback# [phone]

## 2017-05-28 ENCOUNTER — Other Ambulatory Visit: Payer: Self-pay | Admitting: Family Medicine

## 2017-05-28 MED ORDER — OLMESARTAN MEDOXOMIL 40 MG PO TABS: 40.0000 mg | ORAL_TABLET | Freq: Every day | ORAL | 4 refills | Status: DC

## 2017-05-28 MED ORDER — PROMETHAZINE-DM 6.25-15 MG/5ML PO SYRP: ORAL_SOLUTION | ORAL | 0 refills | Status: DC

## 2017-05-28 NOTE — Telephone Encounter (Signed)
Phenergan dM prescribed and benicar 40 mg sent in place of losartan, please let her know

## 2017-05-28 NOTE — Telephone Encounter (Signed)
States her losartan was recalled and she needs a replacement sent in.  ALSO, has been coughing x 3 days. Chest congestion but little mucus production. No fever. Requesting cough med be sent in.

## 2017-05-28 NOTE — Progress Notes (Signed)
Phenergan dm  

## 2017-05-28 NOTE — Telephone Encounter (Signed)
Pt aware.

## 2017-06-05 ENCOUNTER — Encounter: Payer: Self-pay | Admitting: Family Medicine

## 2017-06-06 ENCOUNTER — Ambulatory Visit (INDEPENDENT_AMBULATORY_CARE_PROVIDER_SITE_OTHER): Payer: Medicare Other | Admitting: *Deleted

## 2017-06-06 DIAGNOSIS — I272 Pulmonary hypertension, unspecified: Secondary | ICD-10-CM | POA: Diagnosis not present

## 2017-06-06 DIAGNOSIS — Z5181 Encounter for therapeutic drug level monitoring: Secondary | ICD-10-CM | POA: Diagnosis not present

## 2017-06-06 LAB — POCT INR: INR: 2.9

## 2017-06-06 NOTE — Patient Instructions (Addendum)
Continue  same dosage 2.5mg  daily except 1.25mg  on Sundays. Recheck INR in 3 weeks. Is to have dental extraction of one tooth in the near future spoke with Legrand Como at Dr Vernie Murders office and Legrand Como said Dr Vergia Alberts plans to send to oral surgeon to have this procedure [name] our phone and fax number Fax number [phone] Dr Vergia Alberts phone number is [phone] Call when scheduled for dental extraction if need to hold coumadin or if needs INR checked prior to extraction  [phone]

## 2017-07-09 ENCOUNTER — Ambulatory Visit (INDEPENDENT_AMBULATORY_CARE_PROVIDER_SITE_OTHER): Payer: Medicare Other | Admitting: *Deleted

## 2017-07-09 DIAGNOSIS — Z5181 Encounter for therapeutic drug level monitoring: Secondary | ICD-10-CM | POA: Diagnosis not present

## 2017-07-09 DIAGNOSIS — I272 Pulmonary hypertension, unspecified: Secondary | ICD-10-CM

## 2017-07-09 LAB — POCT INR: INR: 2.1

## 2017-07-09 NOTE — Patient Instructions (Signed)
Description   Continue  same dosage 2.5mg  daily except 1.25mg  on Sundays. Recheck INR in 4 weeks. Is to have dental extraction of one tooth in the near future spoke with Legrand Como at Dr Vernie Murders office and Legrand Como said Dr Vergia Alberts plans to send to oral surgeon to have this procedure [name] our phone and fax number Fax number [phone] Dr Vergia Alberts phone number is [phone] Call when scheduled for dental extraction if needs to hold coumadin or if need INR checked prior to extraction [phone]

## 2017-07-16 DIAGNOSIS — D3611 Benign neoplasm of peripheral nerves and autonomic nervous system of face, head, and neck: Secondary | ICD-10-CM | POA: Diagnosis not present

## 2017-07-16 DIAGNOSIS — I10 Essential (primary) hypertension: Secondary | ICD-10-CM | POA: Diagnosis not present

## 2017-07-22 ENCOUNTER — Other Ambulatory Visit: Payer: Self-pay | Admitting: Cardiology

## 2017-07-22 MED ORDER — HYDROCHLOROTHIAZIDE 12.5 MG PO TABS: 12.5000 mg | ORAL_TABLET | Freq: Every day | ORAL | 2 refills | Status: DC

## 2017-08-06 ENCOUNTER — Other Ambulatory Visit: Payer: Self-pay | Admitting: Family Medicine

## 2017-08-06 ENCOUNTER — Ambulatory Visit (INDEPENDENT_AMBULATORY_CARE_PROVIDER_SITE_OTHER): Payer: Medicare Other | Admitting: *Deleted

## 2017-08-06 DIAGNOSIS — Z5181 Encounter for therapeutic drug level monitoring: Secondary | ICD-10-CM

## 2017-08-06 DIAGNOSIS — I272 Pulmonary hypertension, unspecified: Secondary | ICD-10-CM

## 2017-08-06 LAB — POCT INR: INR: 1.9

## 2017-08-06 NOTE — Patient Instructions (Signed)
Description   Today take 3.75mg , then Continue  same dosage 2.5mg  daily except 1.25mg  on Sundays. Recheck INR in 4 weeks. Is to have dental extraction of one tooth in the near future spoke with Legrand Como at Dr Vernie Murders office and Legrand Como said Dr Vergia Alberts plans to send to oral surgeon to have this procedure [name] our phone and fax number Fax number [phone] Dr Vergia Alberts phone number is [phone] Call when scheduled for dental extraction if needs to hold coumadin or if need INR checked prior to extraction [phone]

## 2017-08-24 ENCOUNTER — Other Ambulatory Visit: Payer: Self-pay | Admitting: Family Medicine

## 2017-09-02 ENCOUNTER — Telehealth: Payer: Self-pay | Admitting: Family Medicine

## 2017-09-02 NOTE — Telephone Encounter (Signed)
Please send Script to [facility]   linagliptin (TRADJENTA) 5 MG TABS tablet

## 2017-09-03 ENCOUNTER — Other Ambulatory Visit: Payer: Self-pay

## 2017-09-03 ENCOUNTER — Ambulatory Visit (INDEPENDENT_AMBULATORY_CARE_PROVIDER_SITE_OTHER): Payer: Medicare Other | Admitting: *Deleted

## 2017-09-03 DIAGNOSIS — Z5181 Encounter for therapeutic drug level monitoring: Secondary | ICD-10-CM | POA: Diagnosis not present

## 2017-09-03 DIAGNOSIS — I272 Pulmonary hypertension, unspecified: Secondary | ICD-10-CM

## 2017-09-03 LAB — POCT INR: INR: 1.6

## 2017-09-03 MED ORDER — LINAGLIPTIN 5 MG PO TABS: 5.0000 mg | ORAL_TABLET | Freq: Every day | ORAL | 1 refills | Status: DC

## 2017-09-03 NOTE — Telephone Encounter (Signed)
Done

## 2017-09-03 NOTE — Patient Instructions (Signed)
Description   Today take 3.75mg , then start taking 2.5mg  daily.  Recheck INR in 2 weeks.  Call when scheduled for dental extraction if needs to hold coumadin or if need INR checked prior to extraction [phone]

## 2017-09-17 ENCOUNTER — Ambulatory Visit (INDEPENDENT_AMBULATORY_CARE_PROVIDER_SITE_OTHER): Payer: Medicare Other | Admitting: *Deleted

## 2017-09-17 DIAGNOSIS — Z5181 Encounter for therapeutic drug level monitoring: Secondary | ICD-10-CM

## 2017-09-17 DIAGNOSIS — I272 Pulmonary hypertension, unspecified: Secondary | ICD-10-CM

## 2017-09-17 LAB — POCT INR: INR: 2.1

## 2017-09-17 NOTE — Patient Instructions (Addendum)
Description   Continue taking 2.5mg  daily.  Recheck INR in 3 weeks. Pt states will come back in 4 weeks and is aware of possible complications in not coming back as scheduled  Call when scheduled for dental extraction if needs to hold coumadin or if need INR checked prior to extraction [phone]

## 2017-09-19 ENCOUNTER — Other Ambulatory Visit: Payer: Self-pay | Admitting: Cardiology

## 2017-09-19 DIAGNOSIS — I739 Peripheral vascular disease, unspecified: Secondary | ICD-10-CM

## 2017-09-19 DIAGNOSIS — E785 Hyperlipidemia, unspecified: Secondary | ICD-10-CM

## 2017-09-19 DIAGNOSIS — I119 Hypertensive heart disease without heart failure: Secondary | ICD-10-CM

## 2017-09-19 DIAGNOSIS — Z5181 Encounter for therapeutic drug level monitoring: Secondary | ICD-10-CM

## 2017-09-19 DIAGNOSIS — Z86711 Personal history of pulmonary embolism: Secondary | ICD-10-CM

## 2017-09-29 ENCOUNTER — Other Ambulatory Visit: Payer: Self-pay | Admitting: Cardiology

## 2017-10-07 ENCOUNTER — Ambulatory Visit: Payer: Medicare Other | Admitting: Family Medicine

## 2017-10-23 ENCOUNTER — Ambulatory Visit (INDEPENDENT_AMBULATORY_CARE_PROVIDER_SITE_OTHER): Payer: Medicare Other | Admitting: Family Medicine

## 2017-10-23 ENCOUNTER — Encounter: Payer: Self-pay | Admitting: Family Medicine

## 2017-10-23 VITALS — BP 142/90 | HR 72 | Resp 16 | Ht 66.0 in | Wt 228.0 lb

## 2017-10-23 DIAGNOSIS — I10 Essential (primary) hypertension: Secondary | ICD-10-CM

## 2017-10-23 DIAGNOSIS — N823 Fistula of vagina to large intestine: Secondary | ICD-10-CM | POA: Diagnosis not present

## 2017-10-23 DIAGNOSIS — Z2821 Immunization not carried out because of patient refusal: Secondary | ICD-10-CM | POA: Diagnosis not present

## 2017-10-23 DIAGNOSIS — Z01419 Encounter for gynecological examination (general) (routine) without abnormal findings: Secondary | ICD-10-CM

## 2017-10-23 DIAGNOSIS — E785 Hyperlipidemia, unspecified: Secondary | ICD-10-CM

## 2017-10-23 DIAGNOSIS — M62838 Other muscle spasm: Secondary | ICD-10-CM | POA: Diagnosis not present

## 2017-10-23 DIAGNOSIS — E1121 Type 2 diabetes mellitus with diabetic nephropathy: Secondary | ICD-10-CM | POA: Diagnosis not present

## 2017-10-23 DIAGNOSIS — M5410 Radiculopathy, site unspecified: Secondary | ICD-10-CM

## 2017-10-23 MED ORDER — TIZANIDINE HCL 4 MG PO TABS: ORAL_TABLET | ORAL | 2 refills | Status: DC

## 2017-10-23 MED ORDER — POTASSIUM CHLORIDE ER 10 MEQ PO TBCR: 10.0000 meq | EXTENDED_RELEASE_TABLET | Freq: Two times a day (BID) | ORAL | 3 refills | Status: DC

## 2017-10-23 MED ORDER — HYDROCHLOROTHIAZIDE 25 MG PO TABS: 25.0000 mg | ORAL_TABLET | Freq: Every day | ORAL | 3 refills | Status: DC

## 2017-10-23 NOTE — Patient Instructions (Signed)
Wellness with nurse is past due , please schedule in next 2 to 3 months  MD follow up in 5 months  Microalb from office today  Blood pressure is high, increase hCTZ to 25 mg one daily and start potassium 10 meq one twice daily   Muscle relaxant for as needed ue is prescribed for neck pain Needs HBA1C, chem 7 and eGFR this week when getting iNR  You are being  Referred for mRI without contrast of low back due to left lower extremity weakness and numbness and recurrent near falls     You are referred to Dr Leo Grosser for gyne exam as we discussed her office will  call you  Please work on weight loss

## 2017-10-23 NOTE — Progress Notes (Signed)
Stephanie Singleton     MRN: [id number]      DOB: October 20, 1945   HPI Stephanie Singleton is here for follow up and re-evaluation of chronic medical conditions, medication management and review of any available recent lab and radiology data.  Preventive health is updated, specifically  Cancer screening and Immunization.  Needs cologuard, refuses immunization Questions or concerns regarding consultations or procedures which the PT has had in the interim are  addressed. The PT denies any adverse reactions to current medications since the last visit.  C/o worsening low back pain radiating to left leg with weakness and numbness of leg and recurrent near falls due to leg weakness' C/o neck spasm Denies polyuria, polydipsia, blurred vision , or hypoglycemic episodes. Does not test regularly C/o leaking of fecal material into vaginal vault needs gyn exam    ROS Denies recent fever or chills. Denies sinus pressure, nasal congestion, ear pain or sore throat. Denies chest congestion, productive cough or wheezing. Denies chest pains, palpitations and leg swelling Denies abdominal pain, nausea, vomiting,diarrhea or constipation.   Denies dysuria, frequency, hesitancy or incontinence.. Denies headaches, seizures,  Denies depression, anxiety or insomnia. Denies skin break down or rash.   PE  BP (!) 142/90   Pulse 72   Resp 16   Ht 5\' 6"  (1.676 m)   Wt 228 lb (103.4 kg)   SpO2 97%   BMI 36.80 kg/m   Patient alert and oriented and in no cardiopulmonary distress.  HEENT: No facial asymmetry, EOMI,   oropharynx pink and moist.  Neck decreased ROM with spasm, no JVD, no mass.  Chest: Clear to auscultation bilaterally.  CVS: S1, S2 no murmurs, no S3.Regular rate.  ABD: Soft non tender.   Ext: No edema  MS: decreased  ROM lumbar spine, normal in  shoulders, hips and knees.  Skin: Intact, no ulcerations or rash noted.  Psych: Good eye contact, normal affect. Memory intact not anxious or  depressed appearing.  CNS: CN 2-12 intact, grade 4  Power in left lower extremity and reduced sensation in left lower extremity  .   Assessment & Plan  Type 2 diabetes with nephropathy [facility]) Stephanie Singleton is reminded of the importance of commitment to daily physical activity for 30 minutes or more, as able and the need to limit carbohydrate intake to 30 to 60 grams per meal to help with blood sugar control.   The need to take medication as prescribed, test blood sugar as directed, and to call between visits if there is a concern that blood sugar is uncontrolled is also discussed.   Stephanie Singleton is reminded of the importance of daily foot exam, annual eye examination, and good blood sugar, blood pressure and cholesterol control. Less well controlled, no med change, needs to reduce sweets , carbs and portion size and lose weight  Diabetic Labs Latest Ref Rng & Units 10/25/2017 04/17/2017 01/30/2017 10/31/2016 06/27/2016  HbA1c 4.8 - 5.6 % 7.2(H) 6.7(H) - 6.8(H) 7.0(H)  Microalbumin Not estab mg/dL - - - - -  Micro/Creat Ratio <30 mcg/mg creat - - - - -  Chol 100 - 199 mg/dL - 150 - - 182  HDL >39 mg/dL - 41 - - 46(L)  Calc LDL 0 - 99 mg/dL - 85 - - 81  Triglycerides 0 - 149 mg/dL - 118 - - 275(H)  Creatinine 0.57 - 1.00 mg/dL 0.97 1.07(H) 1.07(H) 1.54(H) 1.55(H)   BP/Weight 10/23/2017 04/17/2017 04/08/2017 02/04/2017 01/23/2017 2/77/8242 09/03/3612  Systolic BP 431  132 130 128 892 119 417  Diastolic BP 90 88 80 82 70 74 78  Wt. (Lbs) 228 229 229.04 226 228 227 224  BMI 36.8 36.96 36.97 36.48 36.8 36.64 36.15   Foot/eye exam completion dates Latest Ref Rng & Units 10/23/2017 06/27/2016  Eye Exam No Retinopathy - -  Foot Form Completion - Done Done        Vaccination not carried out because of patient refusal Offered and refused vaccines indicated including pneumonia vccine  Morbid obesity ([place]) Deteriorated. Patient re-educated about  the importance of commitment to a  minimum of 150  minutes of exercise per week.  The importance of healthy food choices with portion control discussed. Encouraged to start a food diary, count calories and to consider  joining a support group. Sample diet sheets offered. Goals set by the patient for the next several months.   Weight /BMI 10/23/2017 04/17/2017 04/08/2017  WEIGHT 228 lb 229 lb 229 lb 0.6 oz  HEIGHT 5\' 6"  5\' 6"  5\' 6"   BMI 36.8 kg/m2 36.96 kg/m2 36.97 kg/m2      Hypertension goal BP (blood pressure) < 130/80 Uncontrolled add increase dose hCTZ DASH diet and commitment to daily physical activity for a minimum of 30 minutes discussed and encouraged, as a part of hypertension management. The importance of attaining a healthy weight is also discussed.  BP/Weight 10/23/2017 04/17/2017 04/08/2017 02/04/2017 01/23/2017 10/08/1446 07/09/5629  Systolic BP 497 026 378 588 502 774 128  Diastolic BP 90 88 80 82 70 74 78  Wt. (Lbs) 228 229 229.04 226 228 227 224  BMI 36.8 36.96 36.97 36.48 36.8 36.64 36.15       Neck muscle spasm Uncontrolled neck spasm, start as needed flexeril  Back pain with left-sided radiculopathy Increased low back pain with left lower extremity weakness and numbness and recurrent near falls , needs imaging study to evaluate for nerve damage and disc disease  Hyperlipidemia LDL goal <100 Hyperlipidemia:Low fat diet discussed and encouraged.   Lipid Panel  Lab Results  Component Value Date   CHOL 150 04/17/2017   HDL 41 04/17/2017   LDLCALC 85 04/17/2017   TRIG 118 04/17/2017   CHOLHDL 3.7 04/17/2017   Controlled, no change in medication Updated lab needed at/ before next visit.

## 2017-10-25 ENCOUNTER — Ambulatory Visit (INDEPENDENT_AMBULATORY_CARE_PROVIDER_SITE_OTHER): Payer: Medicare Other | Admitting: *Deleted

## 2017-10-25 DIAGNOSIS — E1121 Type 2 diabetes mellitus with diabetic nephropathy: Secondary | ICD-10-CM | POA: Diagnosis not present

## 2017-10-25 DIAGNOSIS — I272 Pulmonary hypertension, unspecified: Secondary | ICD-10-CM | POA: Diagnosis not present

## 2017-10-25 DIAGNOSIS — Z5181 Encounter for therapeutic drug level monitoring: Secondary | ICD-10-CM

## 2017-10-25 LAB — POCT INR: INR: 2.5

## 2017-10-25 NOTE — Patient Instructions (Signed)
Description   Continue taking 2.5mg daily.  Recheck INR in 4 weeks. Call when scheduled for dental extraction if needs to hold coumadin or if need INR checked prior to extraction [phone]       

## 2017-10-26 ENCOUNTER — Encounter: Payer: Self-pay | Admitting: Family Medicine

## 2017-10-26 LAB — BMP8+EGFR
BUN/Creatinine Ratio: 13 (ref 12–28)
BUN: 13 mg/dL (ref 8–27)
CALCIUM: 9.8 mg/dL (ref 8.7–10.3)
CO2: 25 mmol/L (ref 20–29)
Chloride: 100 mmol/L (ref 96–106)
Creatinine, Ser: 0.97 mg/dL (ref 0.57–1.00)
GFR calc Af Amer: 69 mL/min/{1.73_m2} (ref >59)
GFR calc non Af Amer: 60 mL/min/{1.73_m2} (ref >59)
GLUCOSE: 125 mg/dL — AB (ref 65–99)
POTASSIUM: 3.7 mmol/L (ref 3.5–5.2)
Sodium: 138 mmol/L (ref 134–144)

## 2017-10-26 LAB — HEMOGLOBIN A1C
ESTIMATED AVERAGE GLUCOSE: 160 mg/dL
Hgb A1c MFr Bld: 7.2 % — ABNORMAL HIGH (ref 4.8–5.6)

## 2017-11-04 NOTE — Assessment & Plan Note (Signed)
Deteriorated. Patient re-educated about  the importance of commitment to a  minimum of 150 minutes of exercise per week.  The importance of healthy food choices with portion control discussed. Encouraged to start a food diary, count calories and to consider  joining a support group. Sample diet sheets offered. Goals set by the patient for the next several months.   Weight /BMI 10/23/2017 04/17/2017 04/08/2017  WEIGHT 228 lb 229 lb 229 lb 0.6 oz  HEIGHT 5\' 6"  5\' 6"  5\' 6"   BMI 36.8 kg/m2 36.96 kg/m2 36.97 kg/m2

## 2017-11-04 NOTE — Assessment & Plan Note (Signed)
[name] is reminded of the importance of commitment to daily physical activity for 30 minutes or more, as able and the need to limit carbohydrate intake to 30 to 60 grams per meal to help with blood sugar control.   The need to take medication as prescribed, test blood sugar as directed, and to call between visits if there is a concern that blood sugar is uncontrolled is also discussed.   [name] is reminded of the importance of daily foot exam, annual eye examination, and good blood sugar, blood pressure and cholesterol control. Less well controlled, no med change, needs to reduce sweets , carbs and portion size and lose weight  Diabetic Labs Latest Ref Rng & Units 10/25/2017 04/17/2017 01/30/2017 10/31/2016 06/27/2016  HbA1c 4.8 - 5.6 % 7.2(H) 6.7(H) - 6.8(H) 7.0(H)  Microalbumin Not estab mg/dL - - - - -  Micro/Creat Ratio <30 mcg/mg creat - - - - -  Chol 100 - 199 mg/dL - 150 - - 182  HDL >39 mg/dL - 41 - - 46(L)  Calc LDL 0 - 99 mg/dL - 85 - - 81  Triglycerides 0 - 149 mg/dL - 118 - - 275(H)  Creatinine 0.57 - 1.00 mg/dL 0.97 1.07(H) 1.07(H) 1.54(H) 1.55(H)   BP/Weight 10/23/2017 04/17/2017 04/08/2017 02/04/2017 01/23/2017 12/27/3660 03/05/7653  Systolic BP 650 354 656 812 751 700 174  Diastolic BP 90 88 80 82 70 74 78  Wt. (Lbs) 228 229 229.04 226 228 227 224  BMI 36.8 36.96 36.97 36.48 36.8 36.64 36.15   Foot/eye exam completion dates Latest Ref Rng & Units 10/23/2017 06/27/2016  Eye Exam No Retinopathy - -  Foot Form Completion - Done Done

## 2017-11-04 NOTE — Assessment & Plan Note (Addendum)
Uncontrolled add increase dose hCTZ DASH diet and commitment to daily physical activity for a minimum of 30 minutes discussed and encouraged, as a part of hypertension management. The importance of attaining a healthy weight is also discussed.  BP/Weight 10/23/2017 04/17/2017 04/08/2017 02/04/2017 01/23/2017 07/24/4495 11/01/49  Systolic BP 102 111 735 670 141 030 131  Diastolic BP 90 88 80 82 70 74 78  Wt. (Lbs) 228 229 229.04 226 228 227 224  BMI 36.8 36.96 36.97 36.48 36.8 36.64 36.15

## 2017-11-04 NOTE — Assessment & Plan Note (Signed)
Uncontrolled neck spasm, start as needed flexeril

## 2017-11-04 NOTE — Assessment & Plan Note (Addendum)
Increased low back pain with left lower extremity weakness and numbness and recurrent near falls , needs imaging study to evaluate for nerve damage and disc disease

## 2017-11-04 NOTE — Assessment & Plan Note (Signed)
Offered and refused vaccines indicated including pneumonia vccine

## 2017-11-04 NOTE — Assessment & Plan Note (Signed)
Hyperlipidemia:Low fat diet discussed and encouraged.   Lipid Panel  Lab Results  Component Value Date   CHOL 150 04/17/2017   HDL 41 04/17/2017   LDLCALC 85 04/17/2017   TRIG 118 04/17/2017   CHOLHDL 3.7 04/17/2017   Controlled, no change in medication Updated lab needed at/ before next visit.

## 2017-11-05 ENCOUNTER — Telehealth: Payer: Self-pay | Admitting: Family Medicine

## 2017-11-05 DIAGNOSIS — M5410 Radiculopathy, site unspecified: Secondary | ICD-10-CM

## 2017-11-05 NOTE — Telephone Encounter (Signed)
Patient called stating that her pantoprazole was not authorized per the pharmacy. Please call patient to discuss.

## 2017-11-08 ENCOUNTER — Other Ambulatory Visit: Payer: Self-pay

## 2017-11-08 ENCOUNTER — Ambulatory Visit: Payer: Medicare Other

## 2017-11-08 MED ORDER — PANTOPRAZOLE SODIUM 40 MG PO TBEC: DELAYED_RELEASE_TABLET | ORAL | 1 refills | Status: DC

## 2017-11-08 NOTE — Telephone Encounter (Signed)
Refill was sent in today. We had been a little behind on the refills but it is sent now so let her know if she calls back

## 2017-11-18 ENCOUNTER — Ambulatory Visit (HOSPITAL_COMMUNITY): Payer: Medicare Other

## 2017-11-20 ENCOUNTER — Ambulatory Visit (INDEPENDENT_AMBULATORY_CARE_PROVIDER_SITE_OTHER): Payer: Medicare Other | Admitting: *Deleted

## 2017-11-20 DIAGNOSIS — Z7901 Long term (current) use of anticoagulants: Secondary | ICD-10-CM | POA: Diagnosis not present

## 2017-11-20 DIAGNOSIS — Z5181 Encounter for therapeutic drug level monitoring: Secondary | ICD-10-CM | POA: Diagnosis not present

## 2017-11-20 DIAGNOSIS — I272 Pulmonary hypertension, unspecified: Secondary | ICD-10-CM | POA: Diagnosis not present

## 2017-11-20 LAB — POCT INR: INR: 4.6 — AB (ref 2.0–3.0)

## 2017-11-20 NOTE — Patient Instructions (Signed)
Description   Skip today and tomorrow's dose, then Continue taking 2.5mg  daily.  Recheck INR in 2 weeks.  Call when scheduled for dental extraction if needs to hold coumadin or if need INR checked prior to extraction [phone]

## 2017-11-23 ENCOUNTER — Ambulatory Visit
Admission: RE | Admit: 2017-11-23 | Discharge: 2017-11-23 | Disposition: A | Discharge status: Home / Self Care | Payer: Medicare Other | Source: Ambulatory Visit | Attending: Family Medicine | Admitting: Family Medicine

## 2017-11-23 DIAGNOSIS — M545 Low back pain: Secondary | ICD-10-CM | POA: Diagnosis not present

## 2017-11-23 DIAGNOSIS — M5410 Radiculopathy, site unspecified: Secondary | ICD-10-CM

## 2017-11-28 ENCOUNTER — Other Ambulatory Visit: Payer: Self-pay | Admitting: Family Medicine

## 2017-12-09 ENCOUNTER — Other Ambulatory Visit: Payer: Self-pay | Admitting: Cardiology

## 2017-12-09 DIAGNOSIS — Z86711 Personal history of pulmonary embolism: Secondary | ICD-10-CM

## 2017-12-09 DIAGNOSIS — E785 Hyperlipidemia, unspecified: Secondary | ICD-10-CM

## 2017-12-09 DIAGNOSIS — I119 Hypertensive heart disease without heart failure: Secondary | ICD-10-CM

## 2017-12-09 DIAGNOSIS — I739 Peripheral vascular disease, unspecified: Secondary | ICD-10-CM

## 2017-12-09 DIAGNOSIS — Z5181 Encounter for therapeutic drug level monitoring: Secondary | ICD-10-CM

## 2017-12-10 ENCOUNTER — Other Ambulatory Visit: Payer: Self-pay | Admitting: Cardiology

## 2017-12-10 DIAGNOSIS — E785 Hyperlipidemia, unspecified: Secondary | ICD-10-CM

## 2017-12-10 DIAGNOSIS — I739 Peripheral vascular disease, unspecified: Secondary | ICD-10-CM

## 2017-12-10 DIAGNOSIS — Z5181 Encounter for therapeutic drug level monitoring: Secondary | ICD-10-CM

## 2017-12-10 DIAGNOSIS — Z86711 Personal history of pulmonary embolism: Secondary | ICD-10-CM

## 2017-12-10 DIAGNOSIS — I119 Hypertensive heart disease without heart failure: Secondary | ICD-10-CM

## 2017-12-17 ENCOUNTER — Other Ambulatory Visit: Payer: Self-pay | Admitting: Neurosurgery

## 2017-12-17 DIAGNOSIS — D3611 Benign neoplasm of peripheral nerves and autonomic nervous system of face, head, and neck: Secondary | ICD-10-CM

## 2018-01-15 ENCOUNTER — Ambulatory Visit (INDEPENDENT_AMBULATORY_CARE_PROVIDER_SITE_OTHER): Payer: Medicare Other | Admitting: *Deleted

## 2018-01-15 ENCOUNTER — Ambulatory Visit (INDEPENDENT_AMBULATORY_CARE_PROVIDER_SITE_OTHER): Payer: Medicare Other

## 2018-01-15 VITALS — BP 134/80 | HR 80 | Temp 98.4°F | Ht 66.0 in | Wt 225.2 lb

## 2018-01-15 DIAGNOSIS — Z0000 Encounter for general adult medical examination without abnormal findings: Secondary | ICD-10-CM

## 2018-01-15 DIAGNOSIS — I272 Pulmonary hypertension, unspecified: Secondary | ICD-10-CM | POA: Diagnosis not present

## 2018-01-15 DIAGNOSIS — Z5181 Encounter for therapeutic drug level monitoring: Secondary | ICD-10-CM | POA: Diagnosis not present

## 2018-01-15 LAB — POCT INR: INR: 2.5 (ref 2.0–3.0)

## 2018-01-15 NOTE — Patient Instructions (Addendum)
Description   Continue taking 2.5mg  daily.  Recheck INR in 4 weeks. Call when scheduled for dental extraction if needs to hold coumadin or if need INR checked prior to extraction [phone]

## 2018-01-15 NOTE — Patient Instructions (Signed)
Ms. Hattabaugh , Thank you for taking time to come for your Medicare Wellness Visit. I appreciate your ongoing commitment to your health goals. Please review the following plan we discussed and let me know if I can assist you in the future.   Screening recommendations/referrals: Colonoscopy: up to date Mammogram: up to date Bone Density: Patient declined Recommended yearly ophthalmology/optometry visit for glaucoma screening and checkup Recommended yearly dental visit for hygiene and checkup  Vaccinations: Influenza vaccine: declined Pneumococcal vaccine: declined Tdap vaccine: declined Shingles vaccine: declined    Advanced directives: Advance directive discussed with you today. Even though you declined this today please call our office should you change your mind and we can give you the proper paperwork for you to fill out.  Conditions/risks identified: Continue staying happy and healthy the way that you are  Next appointment: schedule next AWV in 1 year    Preventive Care 65 Years and Older, Female Preventive care refers to lifestyle choices and visits with your health care provider that can promote health and wellness. What does preventive care include?  A yearly physical exam. This is also called an annual well check.  Dental exams once or twice a year.  Routine eye exams. Ask your health care provider how often you should have your eyes checked.  Personal lifestyle choices, including:  Daily care of your teeth and gums.  Regular physical activity.  Eating a healthy diet.  Avoiding tobacco and drug use.  Limiting alcohol use.  Practicing safe sex.  Taking low-dose aspirin every day.  Taking vitamin and mineral supplements as recommended by your health care provider. What happens during an annual well check? The services and screenings done by your health care provider during your annual well check will depend on your age, overall health, lifestyle risk factors,  and family history of disease. Counseling  Your health care provider may ask you questions about your:  Alcohol use.  Tobacco use.  Drug use.  Emotional well-being.  Home and relationship well-being.  Sexual activity.  Eating habits.  History of falls.  Memory and ability to understand (cognition).  Work and work Statistician.  Reproductive health. Screening  You may have the following tests or measurements:  Height, weight, and BMI.  Blood pressure.  Lipid and cholesterol levels. These may be checked every 5 years, or more frequently if you are over 7 years old.  Skin check.  Lung cancer screening. You may have this screening every year starting at age 86 if you have a 30-pack-year history of smoking and currently smoke or have quit within the past 15 years.  Fecal occult blood test (FOBT) of the stool. You may have this test every year starting at age 42.  Flexible sigmoidoscopy or colonoscopy. You may have a sigmoidoscopy every 5 years or a colonoscopy every 10 years starting at age 67.  Hepatitis C blood test.  Hepatitis B blood test.  Sexually transmitted disease (STD) testing.  Diabetes screening. This is done by checking your blood sugar (glucose) after you have not eaten for a while (fasting). You may have this done every 1-3 years.  Bone density scan. This is done to screen for osteoporosis. You may have this done starting at age 64.  Mammogram. This may be done every 1-2 years. Talk to your health care provider about how often you should have regular mammograms. Talk with your health care provider about your test results, treatment options, and if necessary, the need for more tests. Vaccines  Your health care provider may recommend certain vaccines, such as:  Influenza vaccine. This is recommended every year.  Tetanus, diphtheria, and acellular pertussis (Tdap, Td) vaccine. You may need a Td booster every 10 years.  Zoster vaccine. You may need  this after age 33.  Pneumococcal 13-valent conjugate (PCV13) vaccine. One dose is recommended after age 32.  Pneumococcal polysaccharide (PPSV23) vaccine. One dose is recommended after age 31. Talk to your health care provider about which screenings and vaccines you need and how often you need them. This information is not intended to replace advice given to you by your health care provider. Make sure you discuss any questions you have with your health care provider. Document Released: 07/15/2015 Document Revised: 03/07/2016 Document Reviewed: 04/19/2015 Elsevier Interactive Patient Education  2017 [place] Prevention in the Home Falls can cause injuries. They can happen to people of all ages. There are many things you can do to make your home safe and to help prevent falls. What can I do on the outside of my home?  Regularly fix the edges of walkways and driveways and fix any cracks.  Remove anything that might make you trip as you walk through a door, such as a raised step or threshold.  Trim any bushes or trees on the path to your home.  Use bright outdoor lighting.  Clear any walking paths of anything that might make someone trip, such as rocks or tools.  Regularly check to see if handrails are loose or broken. Make sure that both sides of any steps have handrails.  Any raised decks and porches should have guardrails on the edges.  Have any leaves, snow, or ice cleared regularly.  Use sand or salt on walking paths during winter.  Clean up any spills in your garage right away. This includes oil or grease spills. What can I do in the bathroom?  Use night lights.  Install grab bars by the toilet and in the tub and shower. Do not use towel bars as grab bars.  Use non-skid mats or decals in the tub or shower.  If you need to sit down in the shower, use a plastic, non-slip stool.  Keep the floor dry. Clean up any water that spills on the floor as soon as it  happens.  Remove soap buildup in the tub or shower regularly.  Attach bath mats securely with double-sided non-slip rug tape.  Do not have throw rugs and other things on the floor that can make you trip. What can I do in the bedroom?  Use night lights.  Make sure that you have a light by your bed that is easy to reach.  Do not use any sheets or blankets that are too big for your bed. They should not hang down onto the floor.  Have a firm chair that has side arms. You can use this for support while you get dressed.  Do not have throw rugs and other things on the floor that can make you trip. What can I do in the kitchen?  Clean up any spills right away.  Avoid walking on wet floors.  Keep items that you use a lot in easy-to-reach places.  If you need to reach something above you, use a strong step stool that has a grab bar.  Keep electrical cords out of the way.  Do not use floor polish or wax that makes floors slippery. If you must use wax, use non-skid floor wax.  Do  not have throw rugs and other things on the floor that can make you trip. What can I do with my stairs?  Do not leave any items on the stairs.  Make sure that there are handrails on both sides of the stairs and use them. Fix handrails that are broken or loose. Make sure that handrails are as long as the stairways.  Check any carpeting to make sure that it is firmly attached to the stairs. Fix any carpet that is loose or worn.  Avoid having throw rugs at the top or bottom of the stairs. If you do have throw rugs, attach them to the floor with carpet tape.  Make sure that you have a light switch at the top of the stairs and the bottom of the stairs. If you do not have them, ask someone to add them for you. What else can I do to help prevent falls?  Wear shoes that:  Do not have high heels.  Have rubber bottoms.  Are comfortable and fit you well.  Are closed at the toe. Do not wear sandals.  If you  use a stepladder:  Make sure that it is fully opened. Do not climb a closed stepladder.  Make sure that both sides of the stepladder are locked into place.  Ask someone to hold it for you, if possible.  Clearly mark and make sure that you can see:  Any grab bars or handrails.  First and last steps.  Where the edge of each step is.  Use tools that help you move around (mobility aids) if they are needed. These include:  Canes.  Walkers.  Scooters.  Crutches.  Turn on the lights when you go into a dark area. Replace any light bulbs as soon as they burn out.  Set up your furniture so you have a clear path. Avoid moving your furniture around.  If any of your floors are uneven, fix them.  If there are any pets around you, be aware of where they are.  Review your medicines with your doctor. Some medicines can make you feel dizzy. This can increase your chance of falling. Ask your doctor what other things that you can do to help prevent falls. This information is not intended to replace advice given to you by your health care provider. Make sure you discuss any questions you have with your health care provider. Document Released: 04/14/2009 Document Revised: 11/24/2015 Document Reviewed: 07/23/2014 Elsevier Interactive Patient Education  2017 [employer].

## 2018-01-15 NOTE — Progress Notes (Signed)
Subjective:   Stephanie Singleton is a 72 y.o. female who presents for Medicare Annual (Subsequent) preventive examination.  Review of Systems:  N/A Cardiac Risk Factors include: diabetes mellitus;advanced age (>31men, >66 women);obesity (BMI >30kg/m2);sedentary lifestyle     Objective:     Vitals: BP 134/80   Pulse 80   Temp 98.4 F (36.9 C) (Temporal)   Ht 5\' 6"  (1.676 m)   Wt 225 lb 4 oz (102.2 kg)   BMI 36.36 kg/m   Body mass index is 36.36 kg/m.  Advanced Directives 01/15/2018 08/07/2016 10/31/2014 09/07/2014 05/01/2011  Does Patient Have a Medical Advance Directive? No No No No Patient does not have advance directive;Patient would not like information  Would patient like information on creating a medical advance directive? No - Patient declined No - Patient declined No - patient declined information Yes - Scientist, clinical (histocompatibility and immunogenetics) given -    Tobacco Social History   Tobacco Use  Smoking Status Former Smoker  . Packs/day: 0.25  . Years: 20.00  . Pack years: 5.00  . Last attempt to quit: 08/07/1996  . Years since quitting: 21.4  Smokeless Tobacco Never Used     Counseling given: Not Answered   Clinical Intake:  Pre-visit preparation completed: No  Pain : No/denies pain     Nutritional Status: BMI > 30  Obese Nutritional Risks: None Diabetes: Yes CBG done?: No Did pt. bring in CBG monitor from home?: No  How often do you need to have someone help you when you read instructions, pamphlets, or other written materials from your doctor or pharmacy?: 1 - Never What is the last grade level you completed in school?: 1 year of college  Interpreter Needed?: No  Information entered by :: Andrez Grime, LPN  Past Medical History:  Diagnosis Date  . Chronic diastolic CHF (congestive heart failure) ([place])   . CKD (chronic kidney disease), stage III ([place])   . Diabetes mellitus DX X 1 YR   USE METFORMIN ACCORDING TO CBG RESULT  . Dilatation of aorta (HCC)   . Embolism  - blood clot 2004   Pulmonary .on coumadin  . GERD (gastroesophageal reflux disease)    ON PREVACID  . HTN (hypertension)    EKG 10/23 IN EPIC  . Hyperlipidemia   . Hypertension    CARDIOLOGY VISIT.CARDIAC CLEARANCE.LOVENOX INSTRUCTIONS.  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 OSA (obstructive sleep apnea) DX 8 YRS AGO.DOES NOT WEAR MASK  . Pulmonary embolism ([place])    a. 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  . Tumor of soft tissue of neck    Past Surgical History:  Procedure Laterality Date  . CERVICAL FUSION     Family History  Problem Relation Age of Onset  . Heart disease Mother   . Hypertension Mother   . Stroke Mother   . Diabetes Father   . Peripheral vascular disease Father   . Peripheral vascular disease Brother   . Heart disease Brother   . Bladder Cancer Sister 93  . Diabetes Daughter        3 of the 5 daughters  . Diabetes Son        3 of the sons  . Breast cancer Sister 27   Social History   Socioeconomic History  . Marital status: Divorced    Spouse name: Not on file  . Number of children: 70  . Years of education: Not  on file  . Highest education level: Some college, no degree  Occupational History    Employer: DISABLED     Comment: [employer]  Social Needs  . Financial resource strain: Not hard at all  . Food insecurity:    Worry: Never true    Inability: Never true  . Transportation needs:    Medical: No    Non-medical: No  Tobacco Use  . Smoking status: Former Smoker    Packs/day: 0.25    Years: 20.00    Pack years: 5.00    Last attempt to quit: 08/07/1996    Years since quitting: 21.4  . Smokeless tobacco: Never Used  Substance and Sexual Activity  . Alcohol use: No  . Drug use: No  . Sexual activity: Yes  Lifestyle  . Physical activity:    Days per week: 0 days    Minutes per session: 0 min  . Stress: Not at all  Relationships  . Social connections:    Talks on  phone: More than three times a week    Gets together: More than three times a week    Attends religious service: More than 4 times per year    Active member of club or organization: No    Attends meetings of clubs or organizations: Never    Relationship status: Divorced  Other Topics Concern  . Not on file  Social History Narrative  . Not on file    Outpatient Encounter Medications as of 01/15/2018  Medication Sig  . glucose blood (ACCU-CHEK AVIVA PLUS) test strip TEST ONCE DAILY dx E11.9  . hydrochlorothiazide (HYDRODIURIL) 25 MG tablet Take 1 tablet (25 mg total) by mouth daily.  Marland Kitchen HYDROcodone-acetaminophen (NORCO/VICODIN) 5-325 MG tablet Take 1 tablet by mouth 2 (two) times daily as needed (pain).  . hydrOXYzine (ATARAX/VISTARIL) 50 MG tablet TAKE 1 TABLET (50 MG TOTAL) BY MOUTH AT BEDTIME.  Marland Kitchen KLOR-CON M20 20 MEQ tablet TAKE 1 TABLET TWICE A WEEK WHEN YOU TAKE DEMADEX (TORSEMIDE)  . linagliptin (TRADJENTA) 5 MG TABS tablet Take 1 tablet (5 mg total) by mouth daily.  Marland Kitchen olmesartan (BENICAR) 40 MG tablet TAKE 1 TABLET BY MOUTH EVERY DAY  . pantoprazole (PROTONIX) 40 MG tablet TAKE 1 TABLET (40 MG TOTAL) BY MOUTH DAILY.  Marland Kitchen potassium chloride (KLOR-CON 10) 10 MEQ tablet Take 1 tablet (10 mEq total) by mouth 2 (two) times daily.  . pravastatin (PRAVACHOL) 20 MG tablet TAKE 1 TABLET BY MOUTH DAILY  . tiZANidine (ZANAFLEX) 4 MG tablet One tablet at bedtime as needed for neck spasm  . torsemide (DEMADEX) 20 MG tablet TAKE 1 TABLET 2 TIMES A WEEK AS NEEDED FOR SWELLING  . warfarin (COUMADIN) 2.5 MG tablet TAKE AS DIRECTED BY COUMADIN CLINIC  . isosorbide mononitrate (IMDUR) 30 MG 24 hr tablet Take 1 tablet (30 mg total) by mouth daily.   No facility-administered encounter medications on file as of 01/15/2018.     Activities of Daily Living In your present state of health, do you have any difficulty performing the following activities: 01/15/2018 04/08/2017  Hearing? N N  Vision? N N    Difficulty concentrating or making decisions? N N  Walking or climbing stairs? Y N  Comment limited -  Dressing or bathing? N N  Doing errands, shopping? N N  Preparing Food and eating ? N -  Using the Toilet? N -  In the past six months, have you accidently leaked urine? N -  Do you have problems with  loss of bowel control? N -  Managing your Medications? N -  Managing your Finances? N -  Housekeeping or managing your Housekeeping? N -  Some recent data might be hidden    Patient Care Team: Fayrene Helper, MD as PCP - General Rothbart, Cristopher Estimable, MD (Cardiology) Trula Slade, DPM as Consulting Physician (Podiatry) Ashok Pall, MD as Consulting Physician (Neurosurgery)    Assessment:   This is a routine wellness examination for Stephanie Singleton.  Exercise Activities and Dietary recommendations Current Exercise Habits: The patient does not participate in regular exercise at present, Exercise limited by: None identified  Goals    . maintaining      Patient states that she wants to stay happy and healthy the way that she is       Fall Risk Fall Risk  01/15/2018 04/08/2017 12/04/2016 08/07/2016 06/27/2016  Falls in the past year? Yes No No No No  Number falls in past yr: 1 - - - -  Injury with Fall? No - - - -  Follow up Falls prevention discussed - - - -   Is the patient's home free of loose throw rugs in walkways, pet beds, electrical cords, etc?   yes      Grab bars in the bathroom? yes      Handrails on the stairs?   yes      Adequate lighting?   yes  Timed Get Up and Go performed: completed in 30 seconds  Depression Screen PHQ 2/9 Scores 01/15/2018 10/23/2017 04/08/2017 08/07/2016  PHQ - 2 Score 0 0 0 0  PHQ- 9 Score 2 - - -     Cognitive Function     6CIT Screen 01/15/2018 08/07/2016  What Year? 0 points 0 points  What month? 0 points 0 points  What time? 0 points 0 points  Count back from 20 0 points 0 points  Months in reverse 4 points 0 points  Repeat phrase  0 points 0 points  Total Score 4 0    Immunization History  Administered Date(s) Administered  . Td 01/03/2005    Qualifies for Shingles Vaccine?Patient declined   Screening Tests Health Maintenance  Topic Date Due  . OPHTHALMOLOGY EXAM  10/14/2017  . PNA vac Low Risk Adult (1 of 2 - PCV13) 11/05/2018 (Originally 01/11/2013)  . TETANUS/TDAP  01/16/2019 (Originally 01/04/2015)  . INFLUENZA VACCINE  01/30/2018  . HEMOGLOBIN A1C  04/26/2018  . FOOT EXAM  11/05/2018  . MAMMOGRAM  04/26/2019  . COLONOSCOPY  12/08/2023  . DEXA SCAN  Completed  . Hepatitis C Screening  Completed    Cancer Screenings: Lung: Low Dose CT Chest recommended if Age 28-80 years, 30 pack-year currently smoking OR have quit w/in 15years. Patient does not qualify. Breast:  Up to date on Mammogram? Yes   Up to date of Bone Density/Dexa? No, Patient declined  Colorectal: up to date   Additional Screenings: : Hepatitis C Screening: completed 09/28/2014     Plan:   I have personally reviewed and noted the following in the patient's chart:   . Medical and social history . Use of alcohol, tobacco or illicit drugs  . Current medications and supplements . Functional ability and status . Nutritional status . Physical activity . Advanced directives . List of other physicians . Hospitalizations, surgeries, and ER visits in previous 12 months . Vitals . Screenings to include cognitive, depression, and falls . Referrals and appointments  In addition, I have reviewed and discussed with  patient certain preventive protocols, quality metrics, and best practice recommendations. A written personalized care plan for preventive services as well as general preventive health recommendations were provided to patient.     Andrez Grime, LPN  12/25/6387

## 2018-01-17 ENCOUNTER — Other Ambulatory Visit: Payer: Self-pay | Admitting: Physician Assistant

## 2018-01-17 ENCOUNTER — Ambulatory Visit
Admission: RE | Admit: 2018-01-17 | Discharge: 2018-01-17 | Disposition: A | Discharge status: Home / Self Care | Payer: Medicare Other | Source: Ambulatory Visit | Attending: Neurosurgery | Admitting: Neurosurgery

## 2018-01-17 DIAGNOSIS — M50222 Other cervical disc displacement at C5-C6 level: Secondary | ICD-10-CM | POA: Diagnosis not present

## 2018-01-17 DIAGNOSIS — D3611 Benign neoplasm of peripheral nerves and autonomic nervous system of face, head, and neck: Secondary | ICD-10-CM

## 2018-01-17 DIAGNOSIS — M50223 Other cervical disc displacement at C6-C7 level: Secondary | ICD-10-CM | POA: Diagnosis not present

## 2018-01-17 MED ORDER — GADOBENATE DIMEGLUMINE 529 MG/ML IV SOLN
20.0000 mL | Freq: Once | INTRAVENOUS | Status: AC | PRN
  Administered 2018-01-17: 20 mL via INTRAVENOUS

## 2018-01-20 ENCOUNTER — Other Ambulatory Visit: Payer: Self-pay | Admitting: Family Medicine

## 2018-02-04 DIAGNOSIS — D3611 Benign neoplasm of peripheral nerves and autonomic nervous system of face, head, and neck: Secondary | ICD-10-CM | POA: Diagnosis not present

## 2018-02-16 ENCOUNTER — Other Ambulatory Visit: Payer: Self-pay | Admitting: Cardiology

## 2018-02-19 ENCOUNTER — Other Ambulatory Visit: Payer: Self-pay | Admitting: Family Medicine

## 2018-02-26 ENCOUNTER — Other Ambulatory Visit: Payer: Self-pay

## 2018-02-26 ENCOUNTER — Telehealth: Payer: Self-pay | Admitting: Family Medicine

## 2018-02-26 MED ORDER — FLUCONAZOLE 150 MG PO TABS: 150.0000 mg | ORAL_TABLET | Freq: Once | ORAL | 0 refills | Status: AC

## 2018-02-26 NOTE — Telephone Encounter (Signed)
Discharge and itch x 3 days. Per standing order 1 fluconazole sent

## 2018-02-26 NOTE — Telephone Encounter (Signed)
Patient is requesting rx for yeast infrction

## 2018-02-28 ENCOUNTER — Other Ambulatory Visit: Payer: Self-pay | Admitting: Family Medicine

## 2018-03-07 ENCOUNTER — Other Ambulatory Visit: Payer: Self-pay | Admitting: Family Medicine

## 2018-03-11 ENCOUNTER — Ambulatory Visit (INDEPENDENT_AMBULATORY_CARE_PROVIDER_SITE_OTHER): Payer: Medicare Other

## 2018-03-11 DIAGNOSIS — M5410 Radiculopathy, site unspecified: Secondary | ICD-10-CM | POA: Diagnosis not present

## 2018-03-11 DIAGNOSIS — I272 Pulmonary hypertension, unspecified: Secondary | ICD-10-CM

## 2018-03-11 LAB — POCT INR: INR: 3.8 — AB (ref 2.0–3.0)

## 2018-03-11 NOTE — Patient Instructions (Signed)
Please skip coumadin today, then continue taking 2.5mg  daily.  Recheck INR in 3 weeks. Call when scheduled for dental extraction if needs to hold coumadin or if need INR checked prior to extraction [phone]

## 2018-03-21 ENCOUNTER — Other Ambulatory Visit: Payer: Self-pay | Admitting: Family Medicine

## 2018-03-28 ENCOUNTER — Telehealth: Payer: Self-pay | Admitting: Family Medicine

## 2018-03-28 ENCOUNTER — Other Ambulatory Visit: Payer: Self-pay

## 2018-03-28 MED ORDER — HYDROXYZINE HCL 50 MG PO TABS: ORAL_TABLET | ORAL | 1 refills | Status: DC

## 2018-03-28 NOTE — Telephone Encounter (Signed)
Please call in Hydroxyzine to the pharmacy

## 2018-03-28 NOTE — Telephone Encounter (Signed)
Prescription refilled and patient notified

## 2018-04-01 ENCOUNTER — Ambulatory Visit (INDEPENDENT_AMBULATORY_CARE_PROVIDER_SITE_OTHER): Payer: Medicare Other | Admitting: Family Medicine

## 2018-04-01 ENCOUNTER — Encounter: Payer: Self-pay | Admitting: Family Medicine

## 2018-04-01 ENCOUNTER — Ambulatory Visit (INDEPENDENT_AMBULATORY_CARE_PROVIDER_SITE_OTHER): Payer: Medicare Other | Admitting: *Deleted

## 2018-04-01 ENCOUNTER — Other Ambulatory Visit: Payer: Self-pay

## 2018-04-01 ENCOUNTER — Other Ambulatory Visit: Payer: Self-pay | Admitting: Family Medicine

## 2018-04-01 VITALS — BP 138/76 | HR 74 | Resp 12 | Ht 66.0 in | Wt 218.1 lb

## 2018-04-01 DIAGNOSIS — I10 Essential (primary) hypertension: Secondary | ICD-10-CM | POA: Diagnosis not present

## 2018-04-01 DIAGNOSIS — E785 Hyperlipidemia, unspecified: Secondary | ICD-10-CM | POA: Diagnosis not present

## 2018-04-01 DIAGNOSIS — E1121 Type 2 diabetes mellitus with diabetic nephropathy: Secondary | ICD-10-CM

## 2018-04-01 DIAGNOSIS — I272 Pulmonary hypertension, unspecified: Secondary | ICD-10-CM

## 2018-04-01 DIAGNOSIS — E559 Vitamin D deficiency, unspecified: Secondary | ICD-10-CM | POA: Diagnosis not present

## 2018-04-01 DIAGNOSIS — R1011 Right upper quadrant pain: Secondary | ICD-10-CM | POA: Diagnosis not present

## 2018-04-01 DIAGNOSIS — Z1211 Encounter for screening for malignant neoplasm of colon: Secondary | ICD-10-CM

## 2018-04-01 DIAGNOSIS — Z5181 Encounter for therapeutic drug level monitoring: Secondary | ICD-10-CM

## 2018-04-01 DIAGNOSIS — R1013 Epigastric pain: Secondary | ICD-10-CM

## 2018-04-01 DIAGNOSIS — R109 Unspecified abdominal pain: Secondary | ICD-10-CM | POA: Insufficient documentation

## 2018-04-01 DIAGNOSIS — K219 Gastro-esophageal reflux disease without esophagitis: Secondary | ICD-10-CM

## 2018-04-01 DIAGNOSIS — Z2821 Immunization not carried out because of patient refusal: Secondary | ICD-10-CM

## 2018-04-01 LAB — POCT INR: INR: 2 (ref 2.0–3.0)

## 2018-04-01 NOTE — Patient Instructions (Addendum)
F/U in 4 months, call if you need me before  Labs today ordered non fasting, lipid, cmp and EGFRm HBA1C, tSH and vit D at [facility] test to be arranged today  Consider vaccines please  Fasting blood sugar need to be under 130 and over 80    You are referred to [facility] for [place] of your gall bladder, if no abnormality you will be referred to you gI Doc re abdominal pain

## 2018-04-01 NOTE — Assessment & Plan Note (Addendum)
2 month history, recurrent bloating and nausea  And pain, needs  RUQ if normal , needs GI eval

## 2018-04-01 NOTE — Patient Instructions (Signed)
Description   Continue taking 2.5mg  daily.  Recheck INR in 4 weeks. Call when scheduled for dental extraction if needs to hold coumadin or if need INR checked prior to extraction [phone]

## 2018-04-01 NOTE — Progress Notes (Signed)
Stephanie Singleton     MRN: [id number]      DOB: 11-Aug-1945   HPI Stephanie Singleton is here for follow up and re-evaluation of chronic medical conditions, medication management and review of any available recent lab and radiology data.  Preventive health is updated, specifically  Cancer screening and Immunization.  Refuses vaccine, re educated re benefit Questions or concerns regarding consultations or procedures which the PT has had in the interim are  addressed. The PT denies any adverse reactions to current medications since the last visit.  2 month h/o intermittent central abdominal pain, rated at an 8 , aggravated by food, and accompanied by bloating and belching, no change in BM noted,  Denies polyuria, polydipsia, blurred vision , or hypoglycemic episodes. Not testing blood sugar ROS Denies recent fever or chills. Denies sinus pressure, nasal congestion, ear pain or sore throat. Denies chest congestion, productive cough or wheezing. Denies chest pains, palpitations and leg swelling  Denies dysuria, frequency, hesitancy or incontinence. Denies uncontrolled  joint pain, swelling a does have some limitation in mobility. Denies headaches, seizures, numbness, or tingling. Denies depression, anxiety or insomnia. Denies skin break down or rash.   PE  BP 138/76 (BP Location: Right Arm, Patient Position: Sitting, Cuff Size: Large)   Pulse 74   Resp 12   Ht 5\' 6"  (1.676 m)   Wt 218 lb 1.9 oz (98.9 kg)   SpO2 98% Comment: room air  BMI 35.21 kg/m   Patient alert and oriented and in no cardiopulmonary distress.  HEENT: No facial asymmetry, EOMI,   oropharynx pink and moist.  Neck decreased ROM no JVD, no mass.  Chest: Clear to auscultation bilaterally.  CVS: S1, S2 no murmurs, no S3.Regular rate.  ABD: Soft diffuse upper abdominal tenderness, no guarding or rebound. Normal BS, No palpably organomegaly or mass colo guard arranged  Ext: No edema  MS: Adequate but reduced  ROM  spine, adequate in shoulders, hips and knees.  Skin: Intact, no ulcerations or rash noted.  Psych: Good eye contact, normal affect. Memory intact not anxious or depressed appearing.  CNS: CN 2-12 intact, power,  normal throughout.no focal deficits noted.   Assessment & Plan  Abdominal pain 2 month history, recurrent bloating and nausea  And pain, needs  RUQ if normal , needs GI eval  GERD Currently on PPI at max dose , however symptoms suggestive of lack of control, will r/o gallstones, may need GI eval  Morbid obesity ([place]) Improved Patient re-educated about  the importance of commitment to a  minimum of 150 minutes of exercise per week.  The importance of healthy food choices with portion control discussed. Encouraged to start a food diary, count calories and to consider  joining a support group. Sample diet sheets offered. Goals set by the patient for the next several months.   Weight /BMI 04/01/2018 01/15/2018 10/23/2017  WEIGHT 218 lb 1.9 oz 225 lb 4 oz 228 lb  HEIGHT 5\' 6"  5\' 6"  5\' 6"   BMI 35.21 kg/m2 36.36 kg/m2 36.8 kg/m2      Type 2 diabetes with nephropathy ([place]) Stephanie Singleton is reminded of the importance of commitment to daily physical activity for 30 minutes or more, as able and the need to limit carbohydrate intake to 30 to 60 grams per meal to help with blood sugar control.   The need to take medication as prescribed, test blood sugar as directed, and to call between visits if there is a concern that blood sugar  is uncontrolled is also discussed.   Stephanie Singleton is reminded of the importance of daily foot exam, annual eye examination, and good blood sugar, blood pressure and cholesterol control. Improved, no change inmanagement  Diabetic Labs Latest Ref Rng & Units 04/01/2018 10/25/2017 04/17/2017 01/30/2017 10/31/2016  HbA1c 4.8 - 5.6 % 7.0(H) 7.2(H) 6.7(H) - 6.8(H)  Microalbumin Not estab mg/dL - - - - -  Micro/Creat Ratio <30 mcg/mg creat - - - - -  Chol 100 -  199 mg/dL 197 - 150 - -  HDL >39 mg/dL 35(L) - 41 - -  Calc LDL 0 - 99 mg/dL 122(H) - 85 - -  Triglycerides 0 - 149 mg/dL 198(H) - 118 - -  Creatinine 0.57 - 1.00 mg/dL 1.01(H) 0.97 1.07(H) 1.07(H) 1.54(H)   BP/Weight 04/01/2018 01/15/2018 10/23/2017 04/17/2017 04/08/2017 02/04/2017 5/59/7416  Systolic BP 384 536 468 032 122 482 500  Diastolic BP 76 80 90 88 80 82 70  Wt. (Lbs) 218.12 225.25 228 229 229.04 226 228  BMI 35.21 36.36 36.8 36.96 36.97 36.48 36.8   Foot/eye exam completion dates Latest Ref Rng & Units 10/23/2017 06/27/2016  Eye Exam No Retinopathy - -  Foot Form Completion - Done Done        Vaccination not carried out because of patient refusal refuses flu and pneumonia vaccines, re educated  Hypertension goal BP (blood pressure) < 130/80 Sub optimal, but adequate control, no med change DASH diet and commitment to daily physical activity for a minimum of 30 minutes discussed and encouraged, as a part of hypertension management. The importance of attaining a healthy weight is also discussed.  BP/Weight 04/01/2018 01/15/2018 10/23/2017 04/17/2017 04/08/2017 02/04/2017 3/70/4888  Systolic BP 916 945 038 882 800 349 179  Diastolic BP 76 80 90 88 80 82 70  Wt. (Lbs) 218.12 225.25 228 229 229.04 226 228  BMI 35.21 36.36 36.8 36.96 36.97 36.48 36.8       Hyperlipidemia LDL goal <100 Hyperlipidemia:Low fat diet discussed and encouraged.   Lipid Panel  Lab Results  Component Value Date   CHOL 197 04/01/2018   HDL 35 (L) 04/01/2018   LDLCALC 122 (H) 04/01/2018   TRIG 198 (H) 04/01/2018   CHOLHDL 3.7 04/17/2017   Uncontrol ed, needs increased med dose and will need to con tact the pt

## 2018-04-03 LAB — CBC/DIFF AMBIGUOUS DEFAULT
BASOS ABS: 0 10*3/uL (ref 0.0–0.2)
Basos: 0 %
EOS (ABSOLUTE): 0.2 10*3/uL (ref 0.0–0.4)
Eos: 2 %
HEMOGLOBIN: 13.4 g/dL (ref 11.1–15.9)
Hematocrit: 38.8 % (ref 34.0–46.6)
Immature Grans (Abs): 0 10*3/uL (ref 0.0–0.1)
Immature Granulocytes: 0 %
Lymphocytes Absolute: 2.3 10*3/uL (ref 0.7–3.1)
Lymphs: 35 %
MCH: 27.7 pg (ref 26.6–33.0)
MCHC: 34.5 g/dL (ref 31.5–35.7)
MCV: 80 fL (ref 79–97)
MONOS ABS: 0.6 10*3/uL (ref 0.1–0.9)
Monocytes: 9 %
NEUTROS ABS: 3.5 10*3/uL (ref 1.4–7.0)
Neutrophils: 54 %
PLATELETS: 345 10*3/uL (ref 150–450)
RBC: 4.83 x10E6/uL (ref 3.77–5.28)
RDW: 14.7 % (ref 12.3–15.4)
WBC: 6.6 10*3/uL (ref 3.4–10.8)

## 2018-04-03 LAB — TSH: TSH: 0.818 u[IU]/mL (ref 0.450–4.500)

## 2018-04-03 LAB — COMPREHENSIVE METABOLIC PANEL
ALBUMIN: 4.1 g/dL (ref 3.5–4.8)
ALT: 16 IU/L (ref 0–32)
AST: 20 IU/L (ref 0–40)
Albumin/Globulin Ratio: 1.3 (ref 1.2–2.2)
Alkaline Phosphatase: 80 IU/L (ref 39–117)
BUN / CREAT RATIO: 13 (ref 12–28)
BUN: 13 mg/dL (ref 8–27)
Bilirubin Total: 0.6 mg/dL (ref 0.0–1.2)
CO2: 25 mmol/L (ref 20–29)
CREATININE: 1.01 mg/dL — AB (ref 0.57–1.00)
Calcium: 9.4 mg/dL (ref 8.7–10.3)
Chloride: 97 mmol/L (ref 96–106)
GFR calc Af Amer: 65 mL/min/{1.73_m2} (ref >59)
GFR, EST NON AFRICAN AMERICAN: 57 mL/min/{1.73_m2} — AB (ref >59)
GLUCOSE: 200 mg/dL — AB (ref 65–99)
Globulin, Total: 3.1 g/dL (ref 1.5–4.5)
Potassium: 3.9 mmol/L (ref 3.5–5.2)
Sodium: 137 mmol/L (ref 134–144)
Total Protein: 7.2 g/dL (ref 6.0–8.5)

## 2018-04-03 LAB — LIPID PANEL W/O CHOL/HDL RATIO
Cholesterol, Total: 197 mg/dL (ref 100–199)
HDL: 35 mg/dL — AB (ref >39)
LDL CALC: 122 mg/dL — AB (ref 0–99)
Triglycerides: 198 mg/dL — ABNORMAL HIGH (ref 0–149)
VLDL Cholesterol Cal: 40 mg/dL (ref 5–40)

## 2018-04-03 LAB — VITAMIN D 25 HYDROXY (VIT D DEFICIENCY, FRACTURES): VIT D 25 HYDROXY: 25.7 ng/mL — AB (ref 30.0–100.0)

## 2018-04-03 LAB — HGB A1C W/O EAG: HEMOGLOBIN A1C: 7 % — AB (ref 4.8–5.6)

## 2018-04-03 LAB — SPECIMEN STATUS REPORT

## 2018-04-03 LAB — MICROALBUMIN, URINE

## 2018-04-05 ENCOUNTER — Encounter: Payer: Self-pay | Admitting: Family Medicine

## 2018-04-05 NOTE — Assessment & Plan Note (Signed)
Sub optimal, but adequate control, no med change DASH diet and commitment to daily physical activity for a minimum of 30 minutes discussed and encouraged, as a part of hypertension management. The importance of attaining a healthy weight is also discussed.  BP/Weight 04/01/2018 01/15/2018 10/23/2017 04/17/2017 04/08/2017 02/04/2017 7/74/1287  Systolic BP 867 672 094 709 628 366 294  Diastolic BP 76 80 90 88 80 82 70  Wt. (Lbs) 218.12 225.25 228 229 229.04 226 228  BMI 35.21 36.36 36.8 36.96 36.97 36.48 36.8

## 2018-04-05 NOTE — Assessment & Plan Note (Signed)
refuses flu and pneumonia vaccines, re educated

## 2018-04-05 NOTE — Assessment & Plan Note (Signed)
Hyperlipidemia:Low fat diet discussed and encouraged.   Lipid Panel  Lab Results  Component Value Date   CHOL 197 04/01/2018   HDL 35 (L) 04/01/2018   LDLCALC 122 (H) 04/01/2018   TRIG 198 (H) 04/01/2018   CHOLHDL 3.7 04/17/2017   Uncontrol ed, needs increased med dose and will need to con tact the pt

## 2018-04-05 NOTE — Assessment & Plan Note (Signed)
Currently on PPI at max dose , however symptoms suggestive of lack of control, will r/o gallstones, may need GI eval

## 2018-04-05 NOTE — Assessment & Plan Note (Signed)
Improved Patient re-educated about  the importance of commitment to a  minimum of 150 minutes of exercise per week.  The importance of healthy food choices with portion control discussed. Encouraged to start a food diary, count calories and to consider  joining a support group. Sample diet sheets offered. Goals set by the patient for the next several months.   Weight /BMI 04/01/2018 01/15/2018 10/23/2017  WEIGHT 218 lb 1.9 oz 225 lb 4 oz 228 lb  HEIGHT 5\' 6"  5\' 6"  5\' 6"   BMI 35.21 kg/m2 36.36 kg/m2 36.8 kg/m2

## 2018-04-05 NOTE — Assessment & Plan Note (Signed)
Stephanie Singleton is reminded of the importance of commitment to daily physical activity for 30 minutes or more, as able and the need to limit carbohydrate intake to 30 to 60 grams per meal to help with blood sugar control.   The need to take medication as prescribed, test blood sugar as directed, and to call between visits if there is a concern that blood sugar is uncontrolled is also discussed.   Stephanie Singleton is reminded of the importance of daily foot exam, annual eye examination, and good blood sugar, blood pressure and cholesterol control. Improved, no change inmanagement  Diabetic Labs Latest Ref Rng & Units 04/01/2018 10/25/2017 04/17/2017 01/30/2017 10/31/2016  HbA1c 4.8 - 5.6 % 7.0(H) 7.2(H) 6.7(H) - 6.8(H)  Microalbumin Not estab mg/dL - - - - -  Micro/Creat Ratio <30 mcg/mg creat - - - - -  Chol 100 - 199 mg/dL 197 - 150 - -  HDL >39 mg/dL 35(L) - 41 - -  Calc LDL 0 - 99 mg/dL 122(H) - 85 - -  Triglycerides 0 - 149 mg/dL 198(H) - 118 - -  Creatinine 0.57 - 1.00 mg/dL 1.01(H) 0.97 1.07(H) 1.07(H) 1.54(H)   BP/Weight 04/01/2018 01/15/2018 10/23/2017 04/17/2017 04/08/2017 02/04/2017 02/23/1897  Systolic BP 421 031 281 188 677 373 668  Diastolic BP 76 80 90 88 80 82 70  Wt. (Lbs) 218.12 225.25 228 229 229.04 226 228  BMI 35.21 36.36 36.8 36.96 36.97 36.48 36.8   Foot/eye exam completion dates Latest Ref Rng & Units 10/23/2017 06/27/2016  Eye Exam No Retinopathy - -  Foot Form Completion - Done Done

## 2018-04-10 ENCOUNTER — Telehealth: Payer: Self-pay | Admitting: Family Medicine

## 2018-04-10 ENCOUNTER — Other Ambulatory Visit: Payer: Self-pay

## 2018-04-10 ENCOUNTER — Telehealth: Payer: Self-pay

## 2018-04-10 DIAGNOSIS — I10 Essential (primary) hypertension: Secondary | ICD-10-CM

## 2018-04-10 DIAGNOSIS — E1121 Type 2 diabetes mellitus with diabetic nephropathy: Secondary | ICD-10-CM

## 2018-04-10 DIAGNOSIS — Z2821 Immunization not carried out because of patient refusal: Secondary | ICD-10-CM

## 2018-04-10 DIAGNOSIS — E785 Hyperlipidemia, unspecified: Secondary | ICD-10-CM

## 2018-04-10 MED ORDER — PRAVASTATIN SODIUM 20 MG PO TABS: 20.0000 mg | ORAL_TABLET | Freq: Every day | ORAL | 1 refills | Status: DC

## 2018-04-10 NOTE — Telephone Encounter (Signed)
Labs ordered, printed and mailed to patient and patient is aware of labs being ordered.

## 2018-04-10 NOTE — Telephone Encounter (Signed)
Patient returned call

## 2018-04-10 NOTE — Telephone Encounter (Signed)
See result note.  

## 2018-04-13 ENCOUNTER — Other Ambulatory Visit: Payer: Self-pay | Admitting: Physician Assistant

## 2018-04-14 DIAGNOSIS — Z1211 Encounter for screening for malignant neoplasm of colon: Secondary | ICD-10-CM | POA: Diagnosis not present

## 2018-04-17 ENCOUNTER — Other Ambulatory Visit: Payer: Self-pay | Admitting: Physician Assistant

## 2018-04-17 NOTE — Telephone Encounter (Signed)
Outpatient Medication Detail    Disp Refills Start End   isosorbide mononitrate (IMDUR) 30 MG 24 hr tablet 30 tablet 0 04/14/2018    Sig - Route: Take 1 tablet (30 mg total) by mouth daily. Please make overdue appt with Meda Coffee for future refills. [phone]. 1st attempt - Oral   Sent to pharmacy as: isosorbide mononitrate (IMDUR) 30 MG 24 hr tablet   Notes to Pharmacy: Please call us to make overdue appt with Dr. Meda Coffee for future refills. [phone]. 1st attempt   E-Prescribing Status: Receipt confirmed by pharmacy (04/14/2018 10:00 AM EDT)   Pharmacy   [id number] - [address] [place], [place] - [address]

## 2018-04-18 LAB — COLOGUARD: Cologuard: NEGATIVE

## 2018-04-19 ENCOUNTER — Other Ambulatory Visit: Payer: Self-pay | Admitting: Family Medicine

## 2018-04-23 ENCOUNTER — Other Ambulatory Visit: Payer: Self-pay | Admitting: Physician Assistant

## 2018-04-29 ENCOUNTER — Ambulatory Visit (INDEPENDENT_AMBULATORY_CARE_PROVIDER_SITE_OTHER): Payer: Medicare Other | Admitting: *Deleted

## 2018-04-29 DIAGNOSIS — I272 Pulmonary hypertension, unspecified: Secondary | ICD-10-CM

## 2018-04-29 DIAGNOSIS — Z5181 Encounter for therapeutic drug level monitoring: Secondary | ICD-10-CM | POA: Diagnosis not present

## 2018-04-29 LAB — POCT INR: INR: 2.3 (ref 2.0–3.0)

## 2018-04-29 NOTE — Patient Instructions (Signed)
Description   Continue taking 2.5mg  daily.  Recheck INR in 5 weeks. Call when scheduled for dental extraction if needs to hold coumadin or if need INR checked prior to extraction [phone]

## 2018-05-02 ENCOUNTER — Other Ambulatory Visit: Payer: Self-pay | Admitting: Family Medicine

## 2018-05-05 ENCOUNTER — Ambulatory Visit (HOSPITAL_COMMUNITY)
Admission: RE | Admit: 2018-05-05 | Discharge: 2018-05-05 | Disposition: A | Discharge status: Home / Self Care | Payer: Medicare Other | Source: Ambulatory Visit | Attending: Family Medicine | Admitting: Family Medicine

## 2018-05-05 DIAGNOSIS — N2889 Other specified disorders of kidney and ureter: Secondary | ICD-10-CM | POA: Diagnosis not present

## 2018-05-05 DIAGNOSIS — R1013 Epigastric pain: Secondary | ICD-10-CM | POA: Insufficient documentation

## 2018-05-05 DIAGNOSIS — R932 Abnormal findings on diagnostic imaging of liver and biliary tract: Secondary | ICD-10-CM | POA: Diagnosis not present

## 2018-05-05 DIAGNOSIS — R1011 Right upper quadrant pain: Secondary | ICD-10-CM | POA: Diagnosis not present

## 2018-05-05 DIAGNOSIS — K8020 Calculus of gallbladder without cholecystitis without obstruction: Secondary | ICD-10-CM | POA: Insufficient documentation

## 2018-05-05 DIAGNOSIS — K7689 Other specified diseases of liver: Secondary | ICD-10-CM | POA: Diagnosis not present

## 2018-05-09 NOTE — Progress Notes (Signed)
cologuard negative

## 2018-05-26 ENCOUNTER — Other Ambulatory Visit: Payer: Self-pay | Admitting: Cardiology

## 2018-05-26 DIAGNOSIS — I739 Peripheral vascular disease, unspecified: Secondary | ICD-10-CM

## 2018-05-26 DIAGNOSIS — Z86711 Personal history of pulmonary embolism: Secondary | ICD-10-CM

## 2018-05-26 DIAGNOSIS — E785 Hyperlipidemia, unspecified: Secondary | ICD-10-CM

## 2018-05-26 DIAGNOSIS — Z5181 Encounter for therapeutic drug level monitoring: Secondary | ICD-10-CM

## 2018-05-26 DIAGNOSIS — I119 Hypertensive heart disease without heart failure: Secondary | ICD-10-CM

## 2018-05-30 ENCOUNTER — Other Ambulatory Visit: Payer: Self-pay | Admitting: Physician Assistant

## 2018-06-03 ENCOUNTER — Ambulatory Visit (INDEPENDENT_AMBULATORY_CARE_PROVIDER_SITE_OTHER): Payer: Medicare Other | Admitting: *Deleted

## 2018-06-03 DIAGNOSIS — Z5181 Encounter for therapeutic drug level monitoring: Secondary | ICD-10-CM

## 2018-06-03 DIAGNOSIS — I272 Pulmonary hypertension, unspecified: Secondary | ICD-10-CM | POA: Diagnosis not present

## 2018-06-03 LAB — POCT INR: INR: 2.1 (ref 2.0–3.0)

## 2018-06-03 NOTE — Patient Instructions (Signed)
Description   Continue taking 2.5mg  daily.  Recheck INR in 6 weeks. Call when scheduled for dental extraction if needs to hold coumadin or if need INR checked prior to extraction [phone]

## 2018-06-04 ENCOUNTER — Other Ambulatory Visit: Payer: Self-pay | Admitting: Cardiology

## 2018-06-15 ENCOUNTER — Other Ambulatory Visit: Payer: Self-pay | Admitting: Physician Assistant

## 2018-06-16 NOTE — Telephone Encounter (Signed)
Dr Meda Coffee, this pt has not schedule her follow-up appt, as you advised back in 04/2017.  She has been instructed several times to schedule her appt by our refill dept, with several refills given and per protocol, they cannot refill until the pt schedules an appt with you. Please advise on this refill.  Should she refer to her PCP if she is non-compliant with her follow-up appts, or should you refill.  Please advise, Thanks!

## 2018-06-19 ENCOUNTER — Other Ambulatory Visit: Payer: Self-pay | Admitting: Family Medicine

## 2018-07-11 ENCOUNTER — Other Ambulatory Visit: Payer: Self-pay | Admitting: Cardiology

## 2018-07-11 NOTE — Telephone Encounter (Signed)
Dr. Meda Coffee, this pt was to follow-up with you in 6 months as you last advised back on 04/17/17.  She has no showed for multiple appts and has failed to reschedule.  Last time seen was on 04/17/17.  Please advise if you would like to refill this medication?.   Thanks!

## 2018-07-11 NOTE — Telephone Encounter (Signed)
Pt has reached 3rd and final attempt on refills and still has not scheduled an appt. Would you like to refill this medication? Please address, thank you.

## 2018-07-15 ENCOUNTER — Telehealth: Payer: Self-pay | Admitting: *Deleted

## 2018-07-15 ENCOUNTER — Ambulatory Visit (INDEPENDENT_AMBULATORY_CARE_PROVIDER_SITE_OTHER): Payer: Medicare Other

## 2018-07-15 DIAGNOSIS — I272 Pulmonary hypertension, unspecified: Secondary | ICD-10-CM

## 2018-07-15 DIAGNOSIS — E785 Hyperlipidemia, unspecified: Secondary | ICD-10-CM | POA: Diagnosis not present

## 2018-07-15 DIAGNOSIS — I10 Essential (primary) hypertension: Secondary | ICD-10-CM

## 2018-07-15 DIAGNOSIS — Z5181 Encounter for therapeutic drug level monitoring: Secondary | ICD-10-CM

## 2018-07-15 DIAGNOSIS — E1121 Type 2 diabetes mellitus with diabetic nephropathy: Secondary | ICD-10-CM | POA: Diagnosis not present

## 2018-07-15 DIAGNOSIS — Z7689 Persons encountering health services in other specified circumstances: Secondary | ICD-10-CM | POA: Diagnosis not present

## 2018-07-15 LAB — POCT INR: INR: 2.5 (ref 2.0–3.0)

## 2018-07-15 NOTE — Patient Instructions (Signed)
Continue taking 2.5mg  daily.  Recheck INR in 6 weeks. Call when scheduled for dental extraction if needs to hold coumadin or if need INR checked prior to extraction [phone]

## 2018-07-15 NOTE — Telephone Encounter (Signed)
Pt here for coumadin and lab work ordered by her PCP.  Being the pt has not seen Dr Meda Coffee since 2018, she was advised by our Clinical Supervisor to stop by checkout on her way out, and make her overdue follow-up appt with Dr Meda Coffee.

## 2018-07-16 LAB — SPECIMEN STATUS

## 2018-07-17 LAB — COMPREHENSIVE METABOLIC PANEL
ALT: 14 IU/L (ref 0–32)
AST: 17 IU/L (ref 0–40)
Albumin/Globulin Ratio: 1.4 (ref 1.2–2.2)
Albumin: 4.1 g/dL (ref 3.5–4.8)
Alkaline Phosphatase: 91 IU/L (ref 39–117)
BUN/Creatinine Ratio: 12 (ref 12–28)
BUN: 11 mg/dL (ref 8–27)
Bilirubin Total: 0.5 mg/dL (ref 0.0–1.2)
CALCIUM: 9.4 mg/dL (ref 8.7–10.3)
CO2: 23 mmol/L (ref 20–29)
CREATININE: 0.93 mg/dL (ref 0.57–1.00)
Chloride: 99 mmol/L (ref 96–106)
GFR calc Af Amer: 72 mL/min/{1.73_m2} (ref >59)
GFR calc non Af Amer: 62 mL/min/{1.73_m2} (ref >59)
Globulin, Total: 2.9 g/dL (ref 1.5–4.5)
Glucose: 144 mg/dL — ABNORMAL HIGH (ref 65–99)
Potassium: 3.6 mmol/L (ref 3.5–5.2)
Sodium: 138 mmol/L (ref 134–144)
Total Protein: 7 g/dL (ref 6.0–8.5)

## 2018-07-17 LAB — LIPID PANEL
CHOL/HDL RATIO: 3.6 ratio (ref 0.0–4.4)
Cholesterol, Total: 160 mg/dL (ref 100–199)
HDL: 45 mg/dL (ref >39)
LDL Calculated: 86 mg/dL (ref 0–99)
Triglycerides: 144 mg/dL (ref 0–149)
VLDL Cholesterol Cal: 29 mg/dL (ref 5–40)

## 2018-07-17 LAB — HEMOGLOBIN A1C
Est. average glucose Bld gHb Est-mCnc: 154 mg/dL
HEMOGLOBIN A1C: 7 % — AB (ref 4.8–5.6)

## 2018-07-21 ENCOUNTER — Encounter: Payer: Self-pay | Admitting: Family Medicine

## 2018-07-21 ENCOUNTER — Ambulatory Visit (HOSPITAL_COMMUNITY)
Admission: RE | Admit: 2018-07-21 | Discharge: 2018-07-21 | Disposition: A | Discharge status: Home / Self Care | Payer: Medicare Other | Source: Ambulatory Visit | Attending: Family Medicine | Admitting: Family Medicine

## 2018-07-21 ENCOUNTER — Ambulatory Visit (INDEPENDENT_AMBULATORY_CARE_PROVIDER_SITE_OTHER): Payer: Medicare Other | Admitting: Family Medicine

## 2018-07-21 VITALS — BP 130/82 | HR 81 | Resp 12 | Ht 66.0 in | Wt 218.1 lb

## 2018-07-21 DIAGNOSIS — E1121 Type 2 diabetes mellitus with diabetic nephropathy: Secondary | ICD-10-CM

## 2018-07-21 DIAGNOSIS — I10 Essential (primary) hypertension: Secondary | ICD-10-CM

## 2018-07-21 DIAGNOSIS — N2889 Other specified disorders of kidney and ureter: Secondary | ICD-10-CM | POA: Diagnosis not present

## 2018-07-21 DIAGNOSIS — M546 Pain in thoracic spine: Secondary | ICD-10-CM | POA: Diagnosis not present

## 2018-07-21 DIAGNOSIS — K769 Liver disease, unspecified: Secondary | ICD-10-CM

## 2018-07-21 DIAGNOSIS — R0789 Other chest pain: Secondary | ICD-10-CM

## 2018-07-21 DIAGNOSIS — E785 Hyperlipidemia, unspecified: Secondary | ICD-10-CM

## 2018-07-21 DIAGNOSIS — Z2821 Immunization not carried out because of patient refusal: Secondary | ICD-10-CM

## 2018-07-21 MED ORDER — TIZANIDINE HCL 4 MG PO TABS: ORAL_TABLET | ORAL | 1 refills | Status: DC

## 2018-07-21 MED ORDER — PREDNISONE 5 MG PO TABS: ORAL_TABLET | ORAL | 0 refills | Status: DC

## 2018-07-21 NOTE — Assessment & Plan Note (Signed)
Cyst or mass, 3.5 cm refer Urology

## 2018-07-21 NOTE — Assessment & Plan Note (Signed)
Pt with abnormal US liver and h/o alcohol dependence, quit  Drinking x 2 weeks, rum , vodka, and beer

## 2018-07-21 NOTE — Patient Instructions (Addendum)
F/u with MD in 4.5 months, call if you need me sooner  Pls schedule AWV if not already scheduled  New for thoracic pain and spasm are Zanaflex and short course of prednisone  You need to get X rays today at the hospital  Cholesterol, blood pressure and blood sugar are great!  You are referrred to [facility] as we discussed It is important that you exercise regularly at least 30 minutes 5 times a week. If you develop chest pain, have severe difficulty breathing, or feel very tired, stop exercising immediately and seek medical attention  Thank you  for choosing [facility]. We consider it a privelige to serve you.  Delivering excellent health care in a caring and  compassionate way is our goal.  Partnering with you,  so that together we can achieve this goal is our strategy.

## 2018-07-22 ENCOUNTER — Telehealth: Payer: Self-pay | Admitting: *Deleted

## 2018-07-22 NOTE — Telephone Encounter (Signed)
Error

## 2018-07-24 ENCOUNTER — Other Ambulatory Visit: Payer: Self-pay | Admitting: Cardiology

## 2018-07-24 DIAGNOSIS — D49511 Neoplasm of unspecified behavior of right kidney: Secondary | ICD-10-CM | POA: Diagnosis not present

## 2018-07-30 ENCOUNTER — Other Ambulatory Visit: Payer: Self-pay | Admitting: Cardiology

## 2018-07-31 MED ORDER — WARFARIN SODIUM 2.5 MG PO TABS: ORAL_TABLET | ORAL | 1 refills | Status: DC

## 2018-07-31 NOTE — Addendum Note (Signed)
Addended by: Marcelle Overlie D on: 07/31/2018 09:45 AM   Modules accepted: Orders

## 2018-07-31 NOTE — Addendum Note (Signed)
Addended by: SUPPLE, MEGAN E on: 07/31/2018 08:38 AM   Modules accepted: Orders

## 2018-08-09 ENCOUNTER — Encounter: Payer: Self-pay | Admitting: Family Medicine

## 2018-08-09 DIAGNOSIS — M546 Pain in thoracic spine: Secondary | ICD-10-CM | POA: Insufficient documentation

## 2018-08-09 NOTE — Progress Notes (Signed)
Stephanie Singleton     MRN: [id number]      DOB: 02-28-1946   HPI Stephanie Singleton is here for follow up and re-evaluation of chronic medical conditions, medication management and review of any available recent lab and radiology data.  Preventive health is updated, specifically  Cancer screening and Immunization.   Questions or concerns regarding consultations or procedures which the PT has had in the interim are  addressed. The PT denies any adverse reactions to current medications since the last visit.  1 month h/o thoracic spine pain, worse with upper body movement, between shoulder blades, no specific aggravating factor  Denies polyuria, polydipsia, blurred vision , or hypoglycemic episodes. States drinks alcohol daily and is  Now wanting to stop the habit as she is aware of dependency  ROS Denies recent fever or chills. Denies sinus pressure, nasal congestion, ear pain or sore throat. Denies chest congestion, productive cough or wheezing. Denies chest pains, palpitations and leg swelling Denies abdominal pain, nausea, vomiting,diarrhea or constipation.   Denies dysuria, frequency, hesitancy or incontinence.  Denies headaches, seizures, numbness, or tingling. Denies depression, anxiety or insomnia. Denies skin break down or rash.   PE  BP 130/82   Pulse 81   Resp 12   Ht 5\' 6"  (1.676 m)   Wt 218 lb 1.3 oz (98.9 kg)   SpO2 96% Comment: room air  BMI 35.20 kg/m   Patient alert and oriented and in no cardiopulmonary distress.  HEENT: No facial asymmetry, EOMI,   oropharynx pink and moist.  Neck supple no JVD, no mass.  Chest: Clear to auscultation bilaterally.  CVS: S1, S2 no murmurs, no S3.Regular rate.  ABD: Soft non tender.   Ext: No edema  MS: decreased ROM thoracic  ROM spine, normal in shoulders, hips and knees.  Skin: Intact, no ulcerations or rash noted.  Psych: Good eye contact, normal affect. Memory intact not anxious or depressed appearing.  CNS: CN  2-12 intact, power,  normal throughout.no focal deficits noted.   Assessment & Plan  Liver disease, unspecified Pt with abnormal US liver and h/o alcohol dependence, quit  Drinking x 2 weeks, rum , vodka, and beer  Right kidney mass Cyst or mass, 3.5 cm refer Urology  Acute thoracic back pain Anti inflammatory, muscle relaxant and X ray  Hypertension goal BP (blood pressure) < 130/80 Controlled, no change in medication DASH diet and commitment to daily physical activity for a minimum of 30 minutes discussed and encouraged, as a part of hypertension management. The importance of attaining a healthy weight is also discussed.  BP/Weight 07/21/2018 04/01/2018 01/15/2018 10/23/2017 04/17/2017 40/03/8118 07/06/7827  Systolic BP 562 130 865 784 696 295 284  Diastolic BP 82 76 80 90 88 80 82  Wt. (Lbs) 218.08 218.12 225.25 228 229 229.04 226  BMI 35.2 35.21 36.36 36.8 36.96 36.97 36.48       Type 2 diabetes with nephropathy ([place]) Stephanie Singleton is reminded of the importance of commitment to daily physical activity for 30 minutes or more, as able and the need to limit carbohydrate intake to 30 to 60 grams per meal to help with blood sugar control.   The need to take medication as prescribed, test blood sugar as directed, and to call between visits if there is a concern that blood sugar is uncontrolled is also discussed.   Stephanie Singleton is reminded of the importance of daily foot exam, annual eye examination, and good blood sugar, blood pressure and cholesterol control.  Controlled, no change in medication  Diabetic Labs Latest Ref Rng & Units 07/15/2018 04/01/2018 10/25/2017 04/17/2017 01/30/2017  HbA1c 4.8 - 5.6 % 7.0(H) 7.0(H) 7.2(H) 6.7(H) -  Microalbumin Not estab mg/dL - - - - -  Micro/Creat Ratio <30 mcg/mg creat - - - - -  Chol 100 - 199 mg/dL 160 197 - 150 -  HDL >39 mg/dL 45 35(L) - 41 -  Calc LDL 0 - 99 mg/dL 86 122(H) - 85 -  Triglycerides 0 - 149 mg/dL 144 198(H) - 118 -    Creatinine 0.57 - 1.00 mg/dL 0.93 1.01(H) 0.97 1.07(H) 1.07(H)   BP/Weight 07/21/2018 04/01/2018 01/15/2018 10/23/2017 04/17/2017 20/08/5595 10/01/6382  Systolic BP 536 468 032 122 482 500 370  Diastolic BP 82 76 80 90 88 80 82  Wt. (Lbs) 218.08 218.12 225.25 228 229 229.04 226  BMI 35.2 35.21 36.36 36.8 36.96 36.97 36.48   Foot/eye exam completion dates Latest Ref Rng & Units 10/23/2017 06/27/2016  Eye Exam No Retinopathy - -  Foot Form Completion - Done Done        Vaccination not carried out because of patient refusal Offered and again refuses necessary vaccines  Morbid obesity ([place]) Obesity associated with HTN and diabetes, unchanged  Patient re-educated about  the importance of commitment to a  minimum of 150 minutes of exercise per week as able.  The importance of healthy food choices with portion control discussed, as well as eating regularly and within a 12 hour window most days. The need to choose "clean , green" food 50 to 75% of the time is discussed, as well as to make water the primary drink and set a goal of 64 ounces water daily.  Encouraged to start a food diary,  and to consider  joining a support group. Sample diet sheets offered. Goals set by the patient for the next several months.   Weight /BMI 07/21/2018 04/01/2018 01/15/2018  WEIGHT 218 lb 1.3 oz 218 lb 1.9 oz 225 lb 4 oz  HEIGHT 5\' 6"  5\' 6"  5\' 6"   BMI 35.2 kg/m2 35.21 kg/m2 36.36 kg/m2      Hyperlipidemia LDL goal <100 Hyperlipidemia:Low fat diet discussed and encouraged.   Lipid Panel  Lab Results  Component Value Date   CHOL 160 07/15/2018   HDL 45 07/15/2018   LDLCALC 86 07/15/2018   TRIG 144 07/15/2018   CHOLHDL 3.6 07/15/2018   Controlled, no change in medication

## 2018-08-09 NOTE — Assessment & Plan Note (Signed)
Hyperlipidemia:Low fat diet discussed and encouraged.   Lipid Panel  Lab Results  Component Value Date   CHOL 160 07/15/2018   HDL 45 07/15/2018   LDLCALC 86 07/15/2018   TRIG 144 07/15/2018   CHOLHDL 3.6 07/15/2018     Controlled, no change in medication   

## 2018-08-09 NOTE — Assessment & Plan Note (Signed)
Obesity associated with HTN and diabetes, unchanged  Patient re-educated about  the importance of commitment to a  minimum of 150 minutes of exercise per week as able.  The importance of healthy food choices with portion control discussed, as well as eating regularly and within a 12 hour window most days. The need to choose "clean , green" food 50 to 75% of the time is discussed, as well as to make water the primary drink and set a goal of 64 ounces water daily.  Encouraged to start a food diary,  and to consider  joining a support group. Sample diet sheets offered. Goals set by the patient for the next several months.   Weight /BMI 07/21/2018 04/01/2018 01/15/2018  WEIGHT 218 lb 1.3 oz 218 lb 1.9 oz 225 lb 4 oz  HEIGHT 5\' 6"  5\' 6"  5\' 6"   BMI 35.2 kg/m2 35.21 kg/m2 36.36 kg/m2

## 2018-08-09 NOTE — Assessment & Plan Note (Signed)
Anti inflammatory, muscle relaxant and X ray

## 2018-08-09 NOTE — Assessment & Plan Note (Signed)
Offered and again refuses necessary vaccines

## 2018-08-09 NOTE — Assessment & Plan Note (Signed)
Controlled, no change in medication DASH diet and commitment to daily physical activity for a minimum of 30 minutes discussed and encouraged, as a part of hypertension management. The importance of attaining a healthy weight is also discussed.  BP/Weight 07/21/2018 04/01/2018 01/15/2018 10/23/2017 04/17/2017 23/03/5319 08/04/3433  Systolic BP 686 168 372 902 111 552 080  Diastolic BP 82 76 80 90 88 80 82  Wt. (Lbs) 218.08 218.12 225.25 228 229 229.04 226  BMI 35.2 35.21 36.36 36.8 36.96 36.97 36.48

## 2018-08-09 NOTE — Assessment & Plan Note (Signed)
Stephanie Singleton is reminded of the importance of commitment to daily physical activity for 30 minutes or more, as able and the need to limit carbohydrate intake to 30 to 60 grams per meal to help with blood sugar control.   The need to take medication as prescribed, test blood sugar as directed, and to call between visits if there is a concern that blood sugar is uncontrolled is also discussed.   Stephanie Singleton is reminded of the importance of daily foot exam, annual eye examination, and good blood sugar, blood pressure and cholesterol control. Controlled, no change in medication  Diabetic Labs Latest Ref Rng & Units 07/15/2018 04/01/2018 10/25/2017 04/17/2017 01/30/2017  HbA1c 4.8 - 5.6 % 7.0(H) 7.0(H) 7.2(H) 6.7(H) -  Microalbumin Not estab mg/dL - - - - -  Micro/Creat Ratio <30 mcg/mg creat - - - - -  Chol 100 - 199 mg/dL 160 197 - 150 -  HDL >39 mg/dL 45 35(L) - 41 -  Calc LDL 0 - 99 mg/dL 86 122(H) - 85 -  Triglycerides 0 - 149 mg/dL 144 198(H) - 118 -  Creatinine 0.57 - 1.00 mg/dL 0.93 1.01(H) 0.97 1.07(H) 1.07(H)   BP/Weight 07/21/2018 04/01/2018 01/15/2018 10/23/2017 04/17/2017 16/03/6788 09/07/1015  Systolic BP 510 258 527 782 423 536 144  Diastolic BP 82 76 80 90 88 80 82  Wt. (Lbs) 218.08 218.12 225.25 228 229 229.04 226  BMI 35.2 35.21 36.36 36.8 36.96 36.97 36.48   Foot/eye exam completion dates Latest Ref Rng & Units 10/23/2017 06/27/2016  Eye Exam No Retinopathy - -  Foot Form Completion - Done Done

## 2018-08-11 ENCOUNTER — Ambulatory Visit: Payer: Medicare Other | Admitting: Family Medicine

## 2018-08-14 ENCOUNTER — Other Ambulatory Visit: Payer: Self-pay | Admitting: Family Medicine

## 2018-08-16 ENCOUNTER — Other Ambulatory Visit: Payer: Self-pay | Admitting: Family Medicine

## 2018-08-18 ENCOUNTER — Other Ambulatory Visit: Payer: Self-pay | Admitting: Cardiology

## 2018-08-20 ENCOUNTER — Other Ambulatory Visit: Payer: Self-pay | Admitting: Family Medicine

## 2018-08-22 ENCOUNTER — Encounter: Payer: Self-pay | Admitting: Gastroenterology

## 2018-08-22 ENCOUNTER — Ambulatory Visit (INDEPENDENT_AMBULATORY_CARE_PROVIDER_SITE_OTHER): Payer: Medicare Other | Admitting: Gastroenterology

## 2018-08-22 VITALS — BP 108/60 | HR 60 | Ht 66.0 in | Wt 220.6 lb

## 2018-08-22 DIAGNOSIS — K760 Fatty (change of) liver, not elsewhere classified: Secondary | ICD-10-CM

## 2018-08-22 NOTE — Progress Notes (Signed)
HPI: This is a very pleasant 73 year old woman very who was referred to me by Fayrene Helper, MD  to evaluate abnormal liver on ultrasound  Chief complaint is I think I have liver cancer   She has never had jaundice or hepatitis.  She was told that she had a fatty liver in the very distant past.  She had an abdominal ultrasound ordered several months ago for some lower abdominal pains.  See those results below.  I believe she read through her each chart connection the exact text and she became very concerned that she had liver cancer.  She looked up a lot of the words and she became even more concerned.  Her lower abdominal pains were generally brief, always improved by passing gas or moving her bowels.  The pains are much better since she stopped over drinking about a month ago.  She is also taking what sounds like Gas-X about 2 or 3 times a week as well as Mylanta that she has taken for many years to keep her regular.  She has no serious constipation or diarrhea.   Her weight is overall stable    Old Data Reviewed: June 2015 EGD found 2 cm hiatal hernia, otherwise normal. June 2015 colonoscopy found a single subcentimeter polyp.  Medium sized external anal hemorrhoid.  The polyp was not precancerous and she was recommended to have a repeat colonoscopy at 10-year interval.   Cologuard November 2019 was negative.  This was actually not an appropriate test given her usage of chronic blood thinners and also because she had no precancerous polyps on colonoscopy 2015 she does not need colon cancer screening again (of any kind) until June 2025.  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  She is on chronic blood thinners since then.  Most recent echo 2018 shows normal left ventricular ejection fraction and no current pulmonary hypertension.  Abdominal ultrasound November 2019 done for "right upper quadrant pain for 2 months" 1. Gallstones without evidence  for acute cholecystitis or biliary dilatation2. Increased hepatic echogenicity consistent with steatosis and orm hepatocellular disease. 3. Hypoechoic mass upper pole right kidney measuring 3.5 cm, possible complicated cyst.  Blood work throughout 2019 checked several times show completely normal liver tests several times.    Review of systems: Pertinent positive and negative review of systems were noted in the above HPI section. All other review negative.   Past Medical History:  Diagnosis Date  . Chronic diastolic CHF (congestive heart failure) ([place])   . CKD (chronic kidney disease), stage III ([place])   . Diabetes mellitus DX X 1 YR   USE METFORMIN ACCORDING TO CBG RESULT  . Dilatation of aorta ([facility])   . Embolism - blood clot 2004   Pulmonary .on coumadin  . GERD (gastroesophageal reflux disease)    ON PREVACID  . HTN (hypertension)    EKG 10/23 IN EPIC  . Hyperlipidemia   . Hypertension    CARDIOLOGY VISIT.CARDIAC CLEARANCE.LOVENOX INSTRUCTIONS.  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 OSA (obstructive sleep apnea) DX 8 YRS AGO.DOES NOT WEAR MASK  . Pulmonary embolism ([place])    a. 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  . Tumor of soft tissue of neck     Past Surgical History:  Procedure Laterality Date  . CERVICAL FUSION      Current Outpatient Medications  Medication Sig Dispense Refill  .  ACCU-CHEK AVIVA PLUS test strip USE TO TEST ONCE DAILY 100 each 3  . hydrochlorothiazide (HYDRODIURIL) 25 MG tablet Take 1 tablet (25 mg total) by mouth daily. 90 tablet 3  . HYDROcodone-acetaminophen (NORCO/VICODIN) 5-325 MG tablet Take 1 tablet by mouth 2 (two) times daily as needed (pain).    . hydrOXYzine (ATARAX/VISTARIL) 50 MG tablet TAKE 1 TABLET BY MOUTH EVERYDAY AT BEDTIME 90 tablet 1  . isosorbide mononitrate (IMDUR) 30 MG 24 hr tablet Take 1 tablet (30 mg total) by mouth daily. ** DO NOT CRUSH ** 90 tablet 0   . KLOR-CON M20 20 MEQ tablet TAKE 1 TABLET TWICE A WEEK WHEN YOU TAKE DEMADEX (TORSEMIDE) 30 tablet 1  . olmesartan (BENICAR) 40 MG tablet TAKE 1 TABLET BY MOUTH EVERY DAY 90 tablet 0  . pantoprazole (PROTONIX) 40 MG tablet TAKE 1 TABLET BY MOUTH EVERY DAY 90 tablet 1  . pravastatin (PRAVACHOL) 20 MG tablet Take 1 tablet (20 mg total) by mouth daily. 90 tablet 1  . predniSONE (DELTASONE) 5 MG tablet One tablet two times daily for 5 days 10 tablet 0  . tiZANidine (ZANAFLEX) 4 MG tablet TAKE ONE TABLET TWO TIMES DAILY FOR 1 WEEK, THEN AT BEDTIME AS NEEDED 35 tablet 1  . torsemide (DEMADEX) 20 MG tablet TAKE 1 TABLET 2 TIMES A WEEK AS NEEDED FOR SWELLING 30 tablet 1  . TRADJENTA 5 MG TABS tablet TAKE 1 TABLET BY MOUTH EVERY DAY 90 tablet 1  . warfarin (COUMADIN) 2.5 MG tablet Take 1 tablet daily or as directed by Coumadin clinic 100 tablet 1   No current facility-administered medications for this visit.     Allergies as of 08/22/2018 - Review Complete 08/22/2018  Allergen Reaction Noted  . Aspirin Nausea And Vomiting and Nausea Only 06/09/2007  . Lisinopril Other (See Comments) 03/28/2015    Family History  Problem Relation Age of Onset  . Heart disease Mother   . Hypertension Mother   . Stroke Mother   . Diabetes Father   . Peripheral vascular disease Father   . Peripheral vascular disease Brother   . Heart disease Brother   . Bladder Cancer Sister 59  . Diabetes Daughter        3 of the 5 daughters  . Diabetes Son        3 of the sons  . Breast cancer Sister 23  . Liver disease Son   . Colon cancer Neg Hx   . Pancreatic cancer Neg Hx   . Stomach cancer Neg Hx   . Esophageal cancer Neg Hx     Social History   Socioeconomic History  . Marital status: Divorced    Spouse name: Not on file  . Number of children: 41  . Years of education: Not on file  . Highest education level: Some college, no degree  Occupational History    Employer: DISABLED     Comment: [employer]   Social Needs  . Financial resource strain: Not hard at all  . Food insecurity:    Worry: Never true    Inability: Never true  . Transportation needs:    Medical: No    Non-medical: No  Tobacco Use  . Smoking status: Former Smoker    Packs/day: 0.25    Years: 20.00    Pack years: 5.00    Last attempt to quit: 08/07/1996    Years since quitting: 22.0  . Smokeless tobacco: Never Used  Substance and Sexual Activity  . Alcohol  use: No    Comment: upper limits of week, 1-2 daily- until 07/2018, no longer having alcohol per patient  . Drug use: No  . Sexual activity: Yes  Lifestyle  . Physical activity:    Days per week: 0 days    Minutes per session: 0 min  . Stress: Not at all  Relationships  . Social connections:    Talks on phone: More than three times a week    Gets together: More than three times a week    Attends religious service: More than 4 times per year    Active member of club or organization: No    Attends meetings of clubs or organizations: Never    Relationship status: Divorced  . Intimate partner violence:    Fear of current or ex partner: No    Emotionally abused: No    Physically abused: No    Forced sexual activity: No  Other Topics Concern  . Not on file  Social History Narrative  . Not on file     Physical Exam: BP 108/60   Pulse 60   Ht 5\' 6"  (1.676 m)   Wt 220 lb 9.6 oz (100.1 kg)   BMI 35.61 kg/m  Constitutional: generally well-appearing Psychiatric: alert and oriented x3 Eyes: extraocular movements intact Mouth: oral pharynx moist, no lesions Neck: supple no lymphadenopathy Cardiovascular: heart regular rate and rhythm Lungs: clear to auscultation bilaterally Abdomen: soft, nontender, nondistended, no obvious ascites, no peritoneal signs, normal bowel sounds Extremities: no lower extremity edema bilaterally Skin: no lesions on visible extremities   Assessment and plan: 73 y.o. female with mild fatty liver, routine risk for colon  cancer  First I think that she represents a classic case of why it is not always ideal for patients to have direct access to their medical test results before they can be interpreted by the ordering physician.  She read her ultrasound report and became convinced that she has a liver cancer.  We went over the text again today several times and I reassured her that she does not.  At worst I would say she has mild fatty liver disease.  Her liver transaminases have been normal for the last year at least as far as I can tell from epic review.  I reassured her that she has a healthy liver and that she does not need any new testing for it.  I did recommend that she not resume over drinking.  Second I explained to her that she does not need colon cancer screening of any kind until 2025.  I did a colonoscopy for her in 2015 and removed a single polyp that ended up not being precancerous.  Stool based test for her should not be ordered prior to then either.  We talked about her Cologuard test that she had several months ago and I explained to her that she probably did not need it (was over-screening) and since she was on a blood thinner it was not the correct test for her anyway.   Please see the "Patient Instructions" section for addition details about the plan.   Owens Loffler, MD [facility] 08/22/2018, 11:02 AM  Cc: Fayrene Helper, MD

## 2018-08-22 NOTE — Patient Instructions (Addendum)
Your liver is healthy.  Do not resume overdrinking.  Call if any questions or concerns.  Thank you for entrusting me with your care and choosing [facility].  Dr Ardis Hughs

## 2018-08-26 ENCOUNTER — Other Ambulatory Visit: Payer: Self-pay | Admitting: Family Medicine

## 2018-09-12 ENCOUNTER — Other Ambulatory Visit: Payer: Self-pay | Admitting: Family Medicine

## 2018-09-24 ENCOUNTER — Telehealth: Payer: Self-pay

## 2018-09-24 NOTE — Telephone Encounter (Signed)

## 2018-09-26 ENCOUNTER — Ambulatory Visit (INDEPENDENT_AMBULATORY_CARE_PROVIDER_SITE_OTHER): Payer: Medicare Other | Admitting: *Deleted

## 2018-09-26 DIAGNOSIS — Z5181 Encounter for therapeutic drug level monitoring: Secondary | ICD-10-CM

## 2018-09-26 DIAGNOSIS — I272 Pulmonary hypertension, unspecified: Secondary | ICD-10-CM

## 2018-09-26 LAB — POCT INR: INR: 1.7 — AB (ref 2.0–3.0)

## 2018-09-26 NOTE — Patient Instructions (Signed)
Description   Today take 3.75mg  then continue taking 2.5mg  daily.  Recheck INR in 6 weeks. Call when scheduled for dental extraction if needs to hold coumadin or if need INR checked prior to extraction [phone]

## 2018-10-07 ENCOUNTER — Telehealth: Payer: Self-pay | Admitting: *Deleted

## 2018-10-07 NOTE — Telephone Encounter (Signed)
Needs visit with dr Moshe Cipro

## 2018-10-07 NOTE — Telephone Encounter (Signed)
appt made 4-8 at 10:40

## 2018-10-07 NOTE — Telephone Encounter (Signed)
Pt called she was concerned because her blood sugars have been running high for the last week. The past week they have been running 200s or higher. Wanted some advice on what she needed to do. This am it was 293.

## 2018-10-08 ENCOUNTER — Encounter: Payer: Self-pay | Admitting: Family Medicine

## 2018-10-08 ENCOUNTER — Ambulatory Visit (INDEPENDENT_AMBULATORY_CARE_PROVIDER_SITE_OTHER): Payer: Medicare Other | Admitting: Family Medicine

## 2018-10-08 ENCOUNTER — Other Ambulatory Visit: Payer: Self-pay

## 2018-10-08 VITALS — BP 130/82 | Ht 66.0 in | Wt 216.0 lb

## 2018-10-08 DIAGNOSIS — E785 Hyperlipidemia, unspecified: Secondary | ICD-10-CM | POA: Diagnosis not present

## 2018-10-08 DIAGNOSIS — E1159 Type 2 diabetes mellitus with other circulatory complications: Secondary | ICD-10-CM

## 2018-10-08 DIAGNOSIS — E669 Obesity, unspecified: Secondary | ICD-10-CM | POA: Diagnosis not present

## 2018-10-08 DIAGNOSIS — I10 Essential (primary) hypertension: Secondary | ICD-10-CM

## 2018-10-08 MED ORDER — HYDROXYZINE HCL 50 MG PO TABS: ORAL_TABLET | ORAL | 1 refills | Status: DC

## 2018-10-08 MED ORDER — PRAVASTATIN SODIUM 20 MG PO TABS: 20.0000 mg | ORAL_TABLET | Freq: Every day | ORAL | 1 refills | Status: DC

## 2018-10-08 MED ORDER — ISOSORBIDE MONONITRATE ER 30 MG PO TB24: 30.0000 mg | ORAL_TABLET | Freq: Every day | ORAL | 1 refills | Status: DC

## 2018-10-08 MED ORDER — PANTOPRAZOLE SODIUM 40 MG PO TBEC: DELAYED_RELEASE_TABLET | ORAL | 1 refills | Status: DC

## 2018-10-08 MED ORDER — GLIPIZIDE ER 2.5 MG PO TB24: 2.5000 mg | ORAL_TABLET | Freq: Every day | ORAL | 3 refills | Status: DC

## 2018-10-08 NOTE — Patient Instructions (Addendum)
Keep May appointment that is already scheduled, please call if you need me sooner  Please get asap at [place], non fasting HBa1C, chem 7 and EGFr, and microalb  New additional medication glipizide ER 2.5 mg one daily is added , take this with breakfast every morning, and continue tragenta as before  Goal for fasting blood sugar ranges from 80 to 120 and 2 hours after any meal or at bedtime should be between 130 to 170.   Please ensure you eat on a regular schedule and keep carbohydrate at each meal between 30 to 45 gm  It is important that you exercise regularly at least 30 minutes 5 times a week. If you develop chest pain, have severe difficulty breathing, or feel very tired, stop exercising immediately and seek medical attention    Social distancing. Wear a face mask when you leave your home Frequent hand washing with soap and water Keeping your hands off of your face. These 3 practices will help to keep both you and your community healthy during this time. Please practice them faithfully!   Thanks for choosing [facility], we consider it a privelige to serve you.

## 2018-10-08 NOTE — Progress Notes (Signed)
Virtual Visit via Telephone Note  I connected with Stephanie Singleton on 10/08/18 at 10:40 AM EDT by telephone and verified that I am speaking with the correct person using two identifiers.   I discussed the limitations, risks, security and privacy concerns of performing an evaluation and management service by telephone and the availability of in person appointments. I also discussed with the patient that there may be a patient responsible charge related to this service. The patient expressed understanding and agreed to proceed. I am at  My house and so is the patient in her home Visual contact , though desired , is not possible   History of Present Illness: I am concerned that blood sugar is running too high, I need this checked Denies polyuria, polydipsia, blurred vision , or hypoglycemic episodes.  Denies recent fever or chills. Denies sinus pressure, nasal congestion, ear pain or sore throat. Denies chest congestion, productive cough or wheezing. Denies chest pains, palpitations and leg swelling Denies abdominal pain, nausea, vomiting,diarrhea or constipation.   Denies dysuria, frequency, hesitancy or incontinence. Denies joint pain, swelling and limitation in mobility. Denies headaches, seizures, numbness, or tingling. Denies depression, anxiety or insomnia. Denies skin break down or rash.       Observations/Objective: BP 130/82   Wt 216 lb (98 kg)   BMI 34.86 kg/m     Assessment and Plan:  Type 2 diabetes with nephropathy (HCC) Reprts elevated blood sugars and deterioration in control Updated lab needed at/ before next visit. Stephanie Singleton is reminded of the importance of commitment to daily physical activity for 30 minutes or more, as able and the need to limit carbohydrate intake to 30 to 60 grams per meal to help with blood sugar control.   The need to take medication as prescribed, test blood sugar as directed, and to call between visits if there is a concern  that blood sugar is uncontrolled is also discussed.   Stephanie Singleton is reminded of the importance of daily foot exam, annual eye examination, and good blood sugar, blood pressure and cholesterol control.  Diabetic Labs Latest Ref Rng & Units 07/15/2018 04/01/2018 10/25/2017 04/17/2017 01/30/2017  HbA1c 4.8 - 5.6 % 7.0(H) 7.0(H) 7.2(H) 6.7(H) -  Microalbumin Not estab mg/dL - - - - -  Micro/Creat Ratio <30 mcg/mg creat - - - - -  Chol 100 - 199 mg/dL 160 197 - 150 -  HDL >39 mg/dL 45 35(L) - 41 -  Calc LDL 0 - 99 mg/dL 86 122(H) - 85 -  Triglycerides 0 - 149 mg/dL 144 198(H) - 118 -  Creatinine 0.57 - 1.00 mg/dL 0.93 1.01(H) 0.97 1.07(H) 1.07(H)   BP/Weight 10/08/2018 08/22/2018 07/21/2018 04/01/2018 01/15/2018 10/23/2017 25/10/3974  Systolic BP 734 193 790 240 973 532 992  Diastolic BP 82 60 82 76 80 90 88  Wt. (Lbs) 216 220.6 218.08 218.12 225.25 228 229  BMI 34.86 35.61 35.2 35.21 36.36 36.8 36.96   Foot/eye exam completion dates Latest Ref Rng & Units 10/23/2017 06/27/2016  Eye Exam No Retinopathy - -  Foot Form Completion - Done Done      Glipizide ER 2.5 mg daily added , based on pt report  Obesity (BMI 30.0-34.9)   Patient re-educated about  the importance of commitment to a  minimum of 150 minutes of exercise per week as able.  The importance of healthy food choices with portion control discussed, as well as eating regularly and within a 12 hour window most days. The need to  choose "clean , green" food 50 to 75% of the time is discussed, as well as to make water the primary drink and set a goal of 64 ounces water daily.  Encouraged to start a food diary,  and to consider  joining a support group. Sample diet sheets offered. Goals set by the patient for the next several months.   Weight /BMI 10/08/2018 08/22/2018 07/21/2018  WEIGHT 216 lb 220 lb 9.6 oz 218 lb 1.3 oz  HEIGHT - 5\' 6"  5\' 6"   BMI 34.86 kg/m2 35.61 kg/m2 35.2 kg/m2      Hypertension goal BP (blood pressure) <  130/80 Controlled, no change in medication DASH diet and commitment to daily physical activity for a minimum of 30 minutes discussed and encouraged, as a part of hypertension management. The importance of attaining a healthy weight is also discussed.  BP/Weight 10/08/2018 08/22/2018 07/21/2018 04/01/2018 01/15/2018 10/23/2017 71/69/6789  Systolic BP 381 017 510 258 527 782 423  Diastolic BP 82 60 82 76 80 90 88  Wt. (Lbs) 216 220.6 218.08 218.12 225.25 228 229  BMI 34.86 35.61 35.2 35.21 36.36 36.8 36.96       Hyperlipidemia LDL goal <100 Hyperlipidemia:Low fat diet discussed and encouraged.   Lipid Panel  Lab Results  Component Value Date   CHOL 160 07/15/2018   HDL 45 07/15/2018   LDLCALC 86 07/15/2018   TRIG 144 07/15/2018   CHOLHDL 3.6 07/15/2018     Controlled, no change in medication     Follow Up Instructions:    I discussed the assessment and treatment plan with the patient. The patient was provided an opportunity to ask questions and all were answered. The patient agreed with the plan and demonstrated an understanding of the instructions.   The patient was advised to call back or seek an in-person evaluation if the symptoms worsen or if the condition fails to improve as anticipated.  I provided 25 minutes of non-face-to-face time during this encounter.   Tula Nakayama, MD

## 2018-10-09 ENCOUNTER — Encounter: Payer: Self-pay | Admitting: Family Medicine

## 2018-10-09 NOTE — Assessment & Plan Note (Signed)
Reprts elevated blood sugars and deterioration in control Updated lab needed at/ before next visit. Stephanie Singleton is reminded of the importance of commitment to daily physical activity for 30 minutes or more, as able and the need to limit carbohydrate intake to 30 to 60 grams per meal to help with blood sugar control.   The need to take medication as prescribed, test blood sugar as directed, and to call between visits if there is a concern that blood sugar is uncontrolled is also discussed.   Stephanie Singleton is reminded of the importance of daily foot exam, annual eye examination, and good blood sugar, blood pressure and cholesterol control.  Diabetic Labs Latest Ref Rng & Units 07/15/2018 04/01/2018 10/25/2017 04/17/2017 01/30/2017  HbA1c 4.8 - 5.6 % 7.0(H) 7.0(H) 7.2(H) 6.7(H) -  Microalbumin Not estab mg/dL - - - - -  Micro/Creat Ratio <30 mcg/mg creat - - - - -  Chol 100 - 199 mg/dL 160 197 - 150 -  HDL >39 mg/dL 45 35(L) - 41 -  Calc LDL 0 - 99 mg/dL 86 122(H) - 85 -  Triglycerides 0 - 149 mg/dL 144 198(H) - 118 -  Creatinine 0.57 - 1.00 mg/dL 0.93 1.01(H) 0.97 1.07(H) 1.07(H)   BP/Weight 10/08/2018 08/22/2018 07/21/2018 04/01/2018 01/15/2018 10/23/2017 16/04/9603  Systolic BP 540 981 191 478 295 621 308  Diastolic BP 82 60 82 76 80 90 88  Wt. (Lbs) 216 220.6 218.08 218.12 225.25 228 229  BMI 34.86 35.61 35.2 35.21 36.36 36.8 36.96   Foot/eye exam completion dates Latest Ref Rng & Units 10/23/2017 06/27/2016  Eye Exam No Retinopathy - -  Foot Form Completion - Done Done      Glipizide ER 2.5 mg daily added , based on pt report

## 2018-10-09 NOTE — Assessment & Plan Note (Signed)
   Patient re-educated about  the importance of commitment to a  minimum of 150 minutes of exercise per week as able.  The importance of healthy food choices with portion control discussed, as well as eating regularly and within a 12 hour window most days. The need to choose "clean , green" food 50 to 75% of the time is discussed, as well as to make water the primary drink and set a goal of 64 ounces water daily.  Encouraged to start a food diary,  and to consider  joining a support group. Sample diet sheets offered. Goals set by the patient for the next several months.   Weight /BMI 10/08/2018 08/22/2018 07/21/2018  WEIGHT 216 lb 220 lb 9.6 oz 218 lb 1.3 oz  HEIGHT - 5\' 6"  5\' 6"   BMI 34.86 kg/m2 35.61 kg/m2 35.2 kg/m2

## 2018-10-09 NOTE — Assessment & Plan Note (Signed)
Controlled, no change in medication DASH diet and commitment to daily physical activity for a minimum of 30 minutes discussed and encouraged, as a part of hypertension management. The importance of attaining a healthy weight is also discussed.  BP/Weight 10/08/2018 08/22/2018 07/21/2018 04/01/2018 01/15/2018 10/23/2017 16/38/4536  Systolic BP 468 032 122 482 500 370 488  Diastolic BP 82 60 82 76 80 90 88  Wt. (Lbs) 216 220.6 218.08 218.12 225.25 228 229  BMI 34.86 35.61 35.2 35.21 36.36 36.8 36.96

## 2018-10-09 NOTE — Assessment & Plan Note (Signed)
Hyperlipidemia:Low fat diet discussed and encouraged.   Lipid Panel  Lab Results  Component Value Date   CHOL 160 07/15/2018   HDL 45 07/15/2018   LDLCALC 86 07/15/2018   TRIG 144 07/15/2018   CHOLHDL 3.6 07/15/2018     Controlled, no change in medication

## 2018-10-14 ENCOUNTER — Telehealth: Payer: Self-pay | Admitting: *Deleted

## 2018-10-14 NOTE — Telephone Encounter (Signed)
New Message   Patient returning your call and would like you to call her to get her setup for the visit.

## 2018-10-14 NOTE — Telephone Encounter (Signed)
LMOM for patient to call the office to discuss video vs telephone visit and obtain consent for her appointment scheduled for 10/30/18.

## 2018-10-15 NOTE — Telephone Encounter (Signed)
Spoke with patient and she voiced that she is not interested in doing a virtual visit, she prefers to wait until she can come into the office for a live office visit. I made her aware that the earliest our office is scheduling live visits is August. She stated that she would be okay with pushing out her visit until then as she feels fine and isn't having any cardiac issues or concerns. Informed her that I would send an FYI message to Dr Meda Coffee and Karlene Einstein as well as a message to scheduler so that the appointment can be re-scheduled. She voiced her appreciation and understanding.

## 2018-10-16 NOTE — Telephone Encounter (Signed)
Pts virtual OV was cancelled for 4/30, as the pt requested, and rescheduled as she requested for a live OV with Dr Meda Coffee on 8/27 at 10 am.  Scheduling left the pt a detailed message about the appt change made, as she requested.

## 2018-10-22 ENCOUNTER — Telehealth: Payer: Self-pay

## 2018-10-22 NOTE — Telephone Encounter (Signed)
LMOM FOR PRESCREEN  

## 2018-10-22 NOTE — Telephone Encounter (Signed)

## 2018-10-24 ENCOUNTER — Other Ambulatory Visit: Payer: Self-pay

## 2018-10-24 ENCOUNTER — Ambulatory Visit (INDEPENDENT_AMBULATORY_CARE_PROVIDER_SITE_OTHER): Payer: Medicare Other | Admitting: Pharmacist

## 2018-10-24 DIAGNOSIS — I272 Pulmonary hypertension, unspecified: Secondary | ICD-10-CM | POA: Diagnosis not present

## 2018-10-24 LAB — POCT INR: INR: 2.6 (ref 2.0–3.0)

## 2018-10-30 ENCOUNTER — Telehealth: Payer: Medicare Other | Admitting: Cardiology

## 2018-11-03 ENCOUNTER — Other Ambulatory Visit: Payer: Self-pay | Admitting: Cardiology

## 2018-11-03 DIAGNOSIS — Z86711 Personal history of pulmonary embolism: Secondary | ICD-10-CM

## 2018-11-03 DIAGNOSIS — I119 Hypertensive heart disease without heart failure: Secondary | ICD-10-CM

## 2018-11-03 DIAGNOSIS — Z5181 Encounter for therapeutic drug level monitoring: Secondary | ICD-10-CM

## 2018-11-03 DIAGNOSIS — E785 Hyperlipidemia, unspecified: Secondary | ICD-10-CM

## 2018-11-03 DIAGNOSIS — I739 Peripheral vascular disease, unspecified: Secondary | ICD-10-CM

## 2018-11-18 ENCOUNTER — Ambulatory Visit (INDEPENDENT_AMBULATORY_CARE_PROVIDER_SITE_OTHER): Payer: Medicare Other | Admitting: Family Medicine

## 2018-11-18 VITALS — BP 130/82 | Ht 66.0 in | Wt 215.0 lb

## 2018-11-18 DIAGNOSIS — Z1231 Encounter for screening mammogram for malignant neoplasm of breast: Secondary | ICD-10-CM | POA: Diagnosis not present

## 2018-11-18 DIAGNOSIS — E1121 Type 2 diabetes mellitus with diabetic nephropathy: Secondary | ICD-10-CM

## 2018-11-18 DIAGNOSIS — I10 Essential (primary) hypertension: Secondary | ICD-10-CM

## 2018-11-18 DIAGNOSIS — Z7189 Other specified counseling: Secondary | ICD-10-CM

## 2018-11-18 DIAGNOSIS — Z2821 Immunization not carried out because of patient refusal: Secondary | ICD-10-CM

## 2018-11-18 DIAGNOSIS — E785 Hyperlipidemia, unspecified: Secondary | ICD-10-CM | POA: Diagnosis not present

## 2018-11-18 DIAGNOSIS — L299 Pruritus, unspecified: Secondary | ICD-10-CM

## 2018-11-18 DIAGNOSIS — E669 Obesity, unspecified: Secondary | ICD-10-CM

## 2018-11-18 MED ORDER — OLMESARTAN MEDOXOMIL 40 MG PO TABS: 40.0000 mg | ORAL_TABLET | Freq: Every day | ORAL | 3 refills | Status: DC

## 2018-11-18 MED ORDER — HYDROCHLOROTHIAZIDE 25 MG PO TABS: 25.0000 mg | ORAL_TABLET | Freq: Every day | ORAL | 3 refills | Status: DC

## 2018-11-18 MED ORDER — CETIRIZINE HCL 10 MG PO TABS: 10.0000 mg | ORAL_TABLET | Freq: Every day | ORAL | 11 refills | Status: DC

## 2018-11-18 NOTE — Progress Notes (Signed)
Virtual Visit via Telephone Note  I connected with Stephanie Singleton on 11/18/18 at  1:00 PM EDT by telephone and verified that I am speaking with the correct person using two identifiers.  Location: Patient: home Provider: office   I discussed the limitations, risks, security and privacy concerns of performing an evaluation and management service by telephone and the availability of in person appointments. I also discussed with the patient that there may be a patient responsible charge related to this service. The patient expressed understanding and agreed to proceed. This visit type is conducted due to national recommendations for restrictions regarding the COVID -19 Pandemic. Due to the patient's age and / or co morbidities, this format is felt to be most appropriate at this time without adequate follow up. The patient has no access to video technology/ had technical difficulties with video, requiring transitioning to audio format  only ( telephone ). All issues noted this document were discussed and addressed,no physical exam can be performed in this format.    History of Present Illness: Improved blood sugar, FBG around 135 F/u cronic problems, lab immunization and consultation review Denies recent fever or chills. Denies sinus pressure, nasal congestion, ear pain or sore throat. Denies chest congestion, productive cough or wheezing. Denies chest pains, palpitations and leg swelling Denies abdominal pain, nausea, vomiting,diarrhea or constipation.   Denies dysuria, frequency, hesitancy or incontinence. Denies joint pain, swelling and limitation in mobility. Denies headaches, seizures, numbness, or tingling. Denies depression, anxiety or insomnia. Denies skin break down or rash.c/o generalized itching       Observations/Objective: BP 130/82   Ht 5\' 6"  (1.676 m)   Wt 215 lb (97.5 kg)   BMI 34.70 kg/m  Good communication with no confusion and intact memory. Alert and  oriented x 3 No signs of respiratory distress during sppech    Assessment and Plan: Hypertension goal BP (blood pressure) < 130/80 Controlled, no change in medication DASH diet and commitment to daily physical activity for a minimum of 30 minutes discussed and encouraged, as a part of hypertension management. The importance of attaining a healthy weight is also discussed.  BP/Weight 11/18/2018 10/08/2018 08/22/2018 07/21/2018 04/01/2018 01/15/2018 9/32/6712  Systolic BP 458 099 833 825 053 976 734  Diastolic BP 82 82 60 82 76 80 90  Wt. (Lbs) 215 216 220.6 218.08 218.12 225.25 228  BMI 34.7 34.86 35.61 35.2 35.21 36.36 36.8       Obesity (BMI 30.0-34.9) Patient re-educated about  the importance of commitment to a  minimum of 150 minutes of exercise per week as able.  The importance of healthy food choices with portion control discussed, as well as eating regularly and within a 12 hour window most days. The need to choose "clean , green" food 50 to 75% of the time is discussed, as well as to make water the primary drink and set a goal of 64 ounces water daily.  Encouraged to start a food diary,  and to consider  joining a support group.  Goals set by the patient for the next several months.   Weight /BMI 11/18/2018 10/08/2018 08/22/2018  WEIGHT 215 lb 216 lb 220 lb 9.6 oz  HEIGHT 5\' 6"  5\' 6"  5\' 6"   BMI 34.7 kg/m2 34.86 kg/m2 35.61 kg/m2      Type 2 diabetes with nephropathy [facility]) Stephanie Singleton is reminded of the importance of commitment to daily physical activity for 30 minutes or more, as able and the need to limit carbohydrate intake  to 30 to 60 grams per meal to help with blood sugar control.   The need to take medication as prescribed, test blood sugar as directed, and to call between visits if there is a concern that blood sugar is uncontrolled is also discussed.   Stephanie Singleton is reminded of the importance of daily foot exam, annual eye examination, and good blood sugar,  blood pressure and cholesterol control. Updated lab needed at/ before next visit.   Diabetic Labs Latest Ref Rng & Units 07/15/2018 04/01/2018 10/25/2017 04/17/2017 01/30/2017  HbA1c 4.8 - 5.6 % 7.0(H) 7.0(H) 7.2(H) 6.7(H) -  Microalbumin Not estab mg/dL - - - - -  Micro/Creat Ratio <30 mcg/mg creat - - - - -  Chol 100 - 199 mg/dL 160 197 - 150 -  HDL >39 mg/dL 45 35(L) - 41 -  Calc LDL 0 - 99 mg/dL 86 122(H) - 85 -  Triglycerides 0 - 149 mg/dL 144 198(H) - 118 -  Creatinine 0.57 - 1.00 mg/dL 0.93 1.01(H) 0.97 1.07(H) 1.07(H)   BP/Weight 11/18/2018 10/08/2018 08/22/2018 07/21/2018 04/01/2018 01/15/2018 2/35/5732  Systolic BP 202 542 706 237 628 315 176  Diastolic BP 82 82 60 82 76 80 90  Wt. (Lbs) 215 216 220.6 218.08 218.12 225.25 228  BMI 34.7 34.86 35.61 35.2 35.21 36.36 36.8   Foot/eye exam completion dates Latest Ref Rng & Units 10/23/2017 06/27/2016  Eye Exam No Retinopathy - -  Foot Form Completion - Done Done        Hyperlipidemia LDL goal <100 Hyperlipidemia:Low fat diet discussed and encouraged.   Lipid Panel  Lab Results  Component Value Date   CHOL 160 07/15/2018   HDL 45 07/15/2018   LDLCALC 86 07/15/2018   TRIG 144 07/15/2018   CHOLHDL 3.6 07/15/2018  Controlled, no change in medication Updated lab needed at/ before next visit.      Pruritus Pt to start twice daily zyrtec as likely allergy related, no visible rash reported  Vaccination not carried out because of patient refusal Encouraged pt to start vaccines once more and she again refused  Educated About Covid-19 Virus Infection Covid-19 Education  The signs and symptoms of of COVID -19 were discussed with the patient and how to seek care for testing. ( follow up with PCP or arrange  E-visit) The importance of social  distancing is discussed today.     Follow Up Instructions:    I discussed the assessment and treatment plan with the patient. The patient was provided an opportunity to ask questions  and all were answered. The patient agreed with the plan and demonstrated an understanding of the instructions.   The patient was advised to call back or seek an in-person evaluation if the symptoms worsen or if the condition fails to improve as anticipated.  I provided 25 minutes of non-face-to-face time during this encounter.   Tula Nakayama, MD

## 2018-11-18 NOTE — Patient Instructions (Addendum)
Annual physical exam with MD, no pelvic or pap, in septemebr, call if you need me before  Mammogram to be scheduled at the [facility] for August or September  Please keep wellness appointment  Good that you are doing well, and blood sugar has improved , per your report  Please get fasting lipid, cmp and EGFR, hBA1C and microalb early August  Certrizine ( generic zyrtec) has been sent to your pharmacy for itching, you may take one or two tablets daily  It is important that you exercise regularly at least 30 minutes 5 times a week. If you develop chest pain, have severe difficulty breathing, or feel very tired, stop exercising immediately and seek medical attention  Think about what you will eat, plan ahead. Choose " clean, green, fresh or frozen" over canned, processed or packaged foods which are more sugary, salty and fatty. 70 to 75% of food eaten should be vegetables and fruit. Three meals at set times with snacks allowed between meals, but they must be fruit or vegetables. Aim to eat over a 12 hour period , example 7 am to 7 pm, and STOP after  your last meal of the day. Drink water,generally about 64 ounces per day, no other drink is as healthy. Fruit juice is best enjoyed in a healthy way, by EATING the fruit.  Social distancing.Stay 6 ft awy from people who you do not live with Frequent hand washing with soap and water Keeping your hands off of your face.wear a mask These 3 practices will help to keep both you and your community healthy during this time. Please practice them faithfully!   Thanks for choosing [facility], we consider it a privelige to serve you.

## 2018-11-24 ENCOUNTER — Encounter: Payer: Self-pay | Admitting: Family Medicine

## 2018-11-24 DIAGNOSIS — Z7189 Other specified counseling: Secondary | ICD-10-CM | POA: Insufficient documentation

## 2018-11-24 NOTE — Assessment & Plan Note (Signed)
Hyperlipidemia:Low fat diet discussed and encouraged.   Lipid Panel  Lab Results  Component Value Date   CHOL 160 07/15/2018   HDL 45 07/15/2018   LDLCALC 86 07/15/2018   TRIG 144 07/15/2018   CHOLHDL 3.6 07/15/2018  Controlled, no change in medication Updated lab needed at/ before next visit.

## 2018-11-24 NOTE — Assessment & Plan Note (Signed)
Pt to start twice daily zyrtec as likely allergy related, no visible rash reported

## 2018-11-24 NOTE — Assessment & Plan Note (Signed)
Encouraged pt to start vaccines once more and she again refused

## 2018-11-24 NOTE — Assessment & Plan Note (Signed)
Patient re-educated about  the importance of commitment to a  minimum of 150 minutes of exercise per week as able.  The importance of healthy food choices with portion control discussed, as well as eating regularly and within a 12 hour window most days. The need to choose "clean , green" food 50 to 75% of the time is discussed, as well as to make water the primary drink and set a goal of 64 ounces water daily.  Encouraged to start a food diary,  and to consider  joining a support group.  Goals set by the patient for the next several months.   Weight /BMI 11/18/2018 10/08/2018 08/22/2018  WEIGHT 215 lb 216 lb 220 lb 9.6 oz  HEIGHT 5\' 6"  5\' 6"  5\' 6"   BMI 34.7 kg/m2 34.86 kg/m2 35.61 kg/m2

## 2018-11-24 NOTE — Assessment & Plan Note (Signed)
Stephanie Singleton is reminded of the importance of commitment to daily physical activity for 30 minutes or more, as able and the need to limit carbohydrate intake to 30 to 60 grams per meal to help with blood sugar control.   The need to take medication as prescribed, test blood sugar as directed, and to call between visits if there is a concern that blood sugar is uncontrolled is also discussed.   Stephanie Singleton is reminded of the importance of daily foot exam, annual eye examination, and good blood sugar, blood pressure and cholesterol control. Updated lab needed at/ before next visit.   Diabetic Labs Latest Ref Rng & Units 07/15/2018 04/01/2018 10/25/2017 04/17/2017 01/30/2017  HbA1c 4.8 - 5.6 % 7.0(H) 7.0(H) 7.2(H) 6.7(H) -  Microalbumin Not estab mg/dL - - - - -  Micro/Creat Ratio <30 mcg/mg creat - - - - -  Chol 100 - 199 mg/dL 160 197 - 150 -  HDL >39 mg/dL 45 35(L) - 41 -  Calc LDL 0 - 99 mg/dL 86 122(H) - 85 -  Triglycerides 0 - 149 mg/dL 144 198(H) - 118 -  Creatinine 0.57 - 1.00 mg/dL 0.93 1.01(H) 0.97 1.07(H) 1.07(H)   BP/Weight 11/18/2018 10/08/2018 08/22/2018 07/21/2018 04/01/2018 01/15/2018 2/72/5366  Systolic BP 440 347 425 956 387 564 332  Diastolic BP 82 82 60 82 76 80 90  Wt. (Lbs) 215 216 220.6 218.08 218.12 225.25 228  BMI 34.7 34.86 35.61 35.2 35.21 36.36 36.8   Foot/eye exam completion dates Latest Ref Rng & Units 10/23/2017 06/27/2016  Eye Exam No Retinopathy - -  Foot Form Completion - Done Done

## 2018-11-24 NOTE — Assessment & Plan Note (Signed)
Controlled, no change in medication DASH diet and commitment to daily physical activity for a minimum of 30 minutes discussed and encouraged, as a part of hypertension management. The importance of attaining a healthy weight is also discussed.  BP/Weight 11/18/2018 10/08/2018 08/22/2018 07/21/2018 04/01/2018 01/15/2018 01/05/2375  Systolic BP 283 151 761 607 371 062 694  Diastolic BP 82 82 60 82 76 80 90  Wt. (Lbs) 215 216 220.6 218.08 218.12 225.25 228  BMI 34.7 34.86 35.61 35.2 35.21 36.36 36.8

## 2018-11-24 NOTE — Assessment & Plan Note (Signed)
Covid-19 Education  The signs and symptoms of of COVID -19 were discussed with the patient and how to seek care for testing. ( follow up with PCP or arrange  E-visit) The importance of social  distancing is discussed today.  

## 2018-12-07 IMAGING — MG 2D DIGITAL DIAGNOSTIC BILATERAL MAMMOGRAM WITH CAD AND ADJUNCT T
8 of 15 series · 8 of 31 positions shown · non-contrast
Comparison: Prior mammograms with magnification views dating back
to 11/20/2013

CLINICAL DATA: 71-year-old female for follow-up of left breast
calcifications and for annual bilateral mammograms.

EXAM:
2D DIGITAL DIAGNOSTIC BILATERAL MAMMOGRAM WITH CAD AND ADJUNCT TOMO

[L ML]
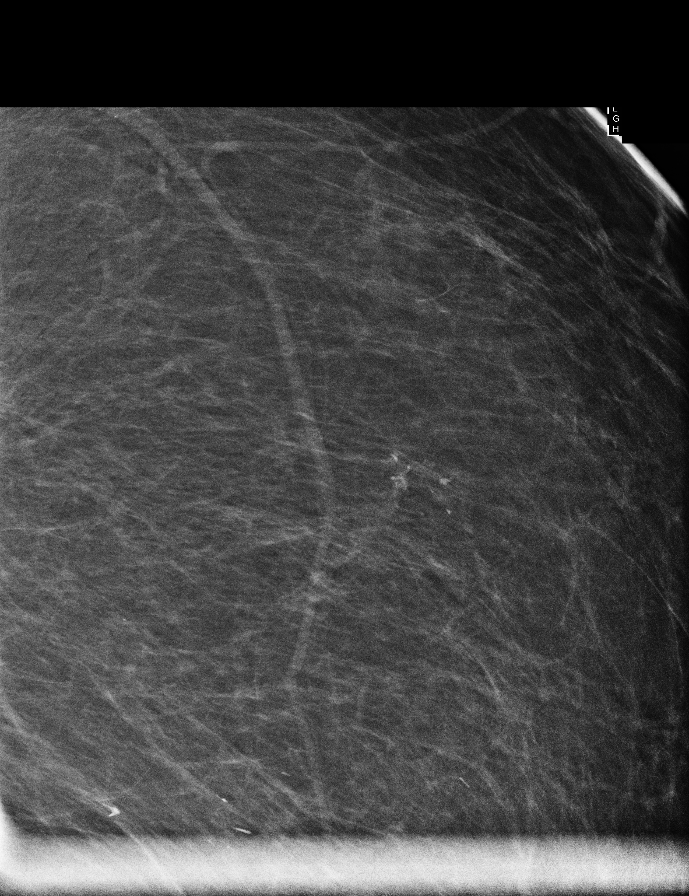

[L CC (1 of 2)]
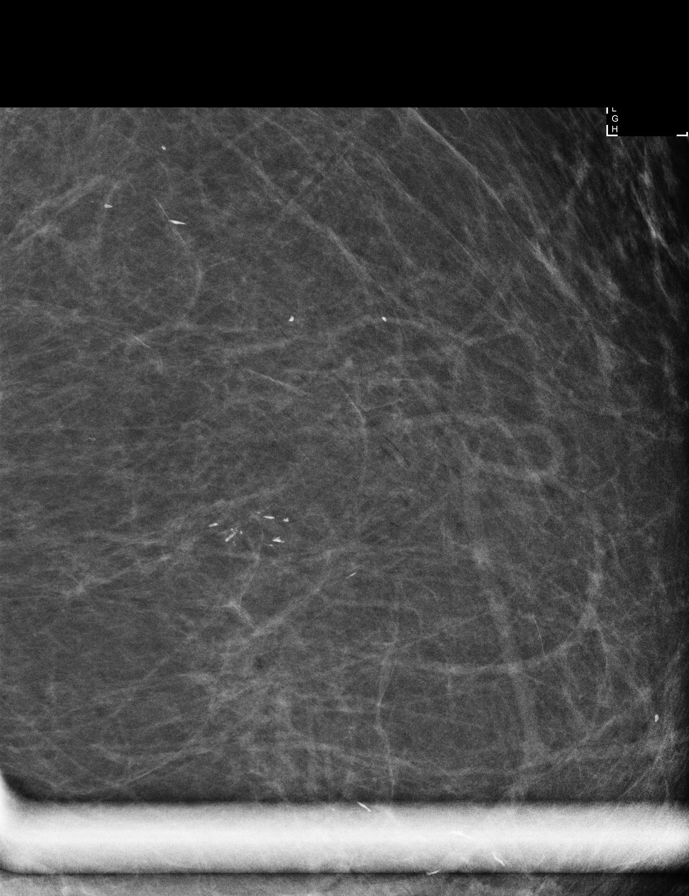

[L MLO]
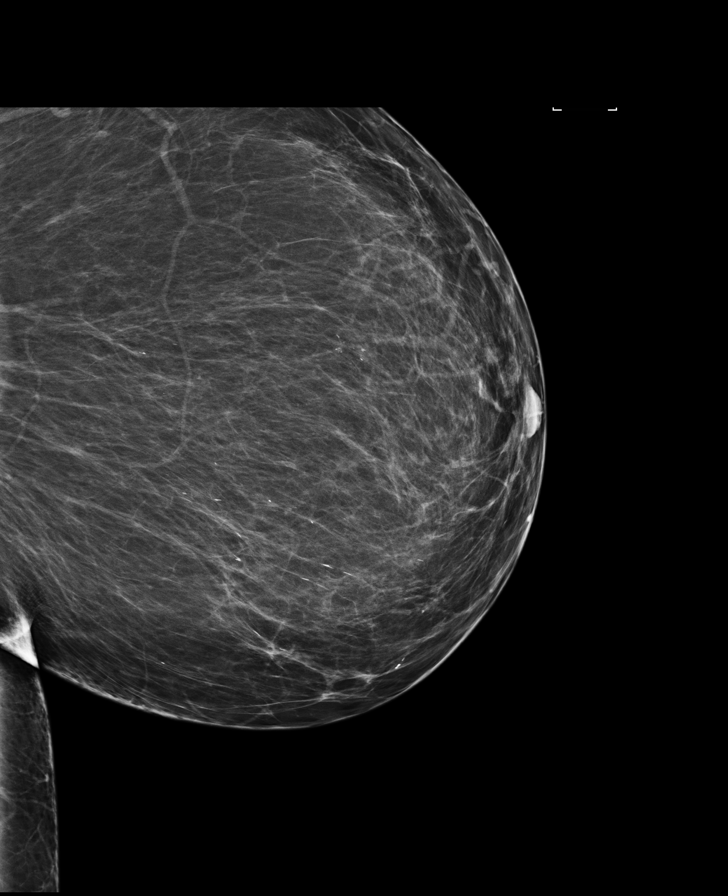

[L CC synth-2D]
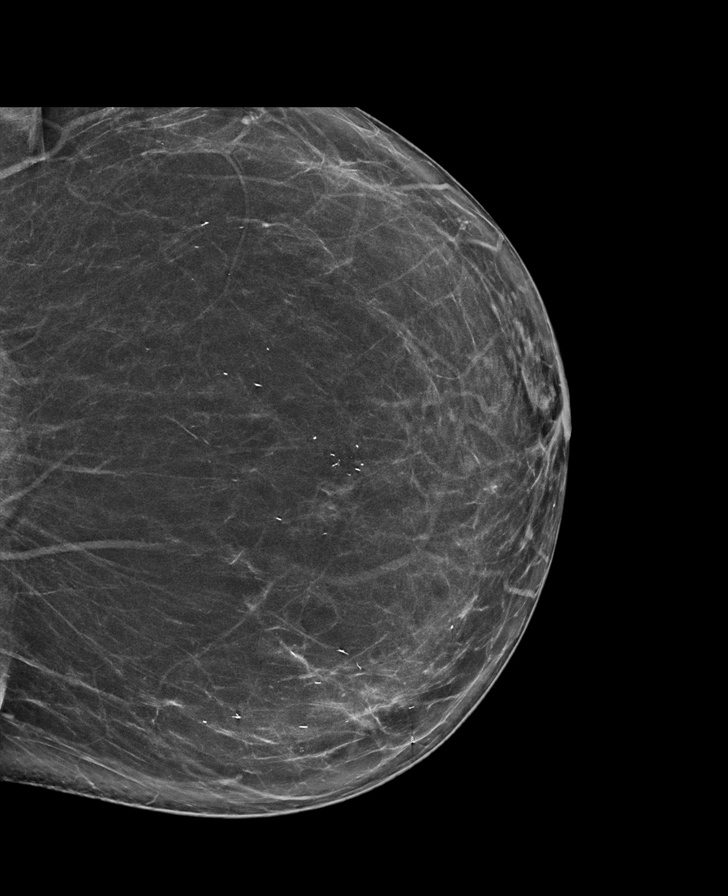

[L MLO synth-2D]
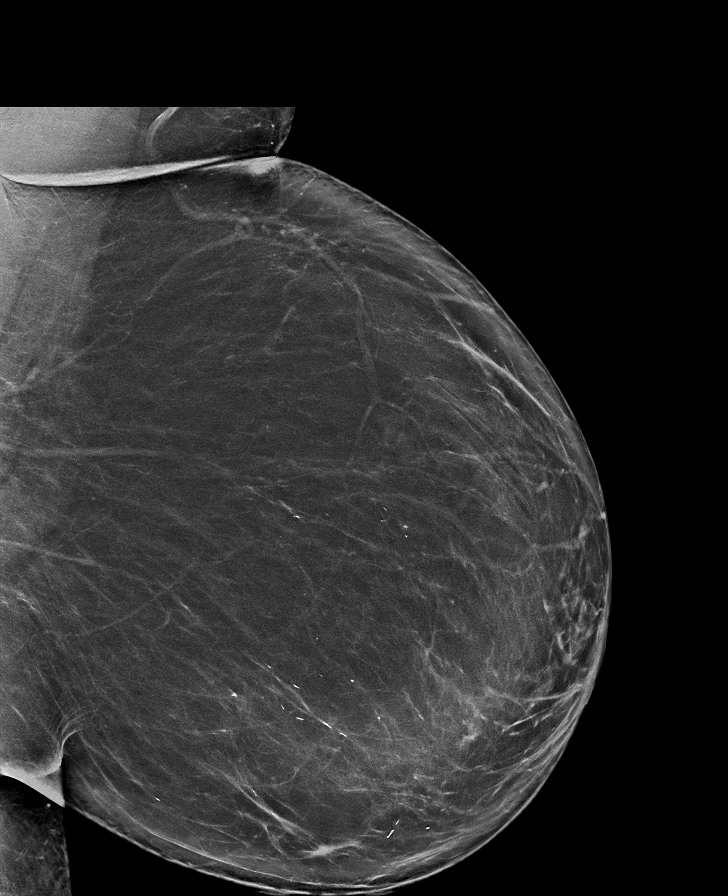

[R CC]
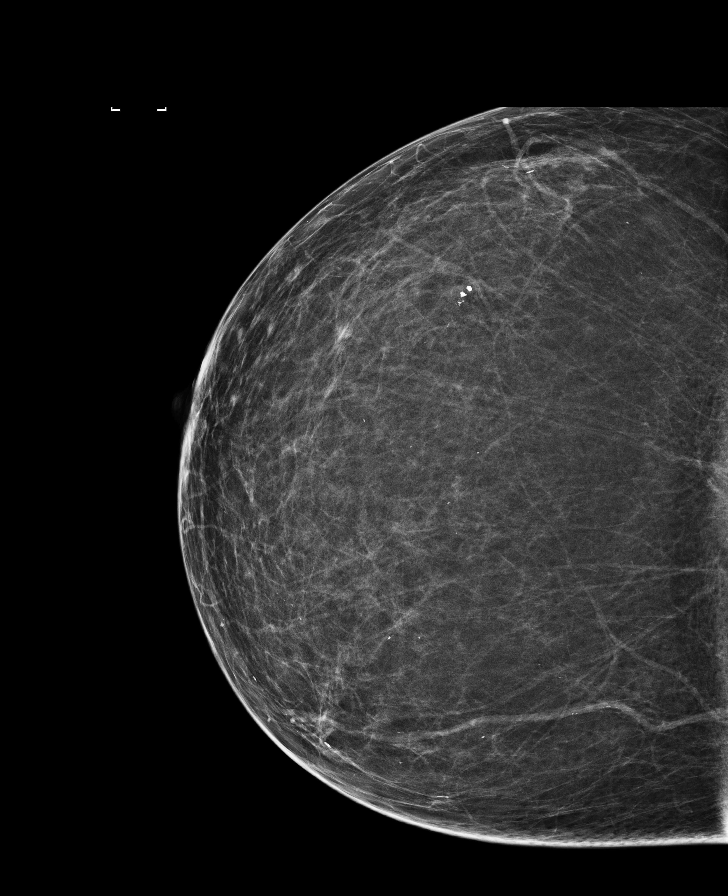

[L CC (2 of 2)]
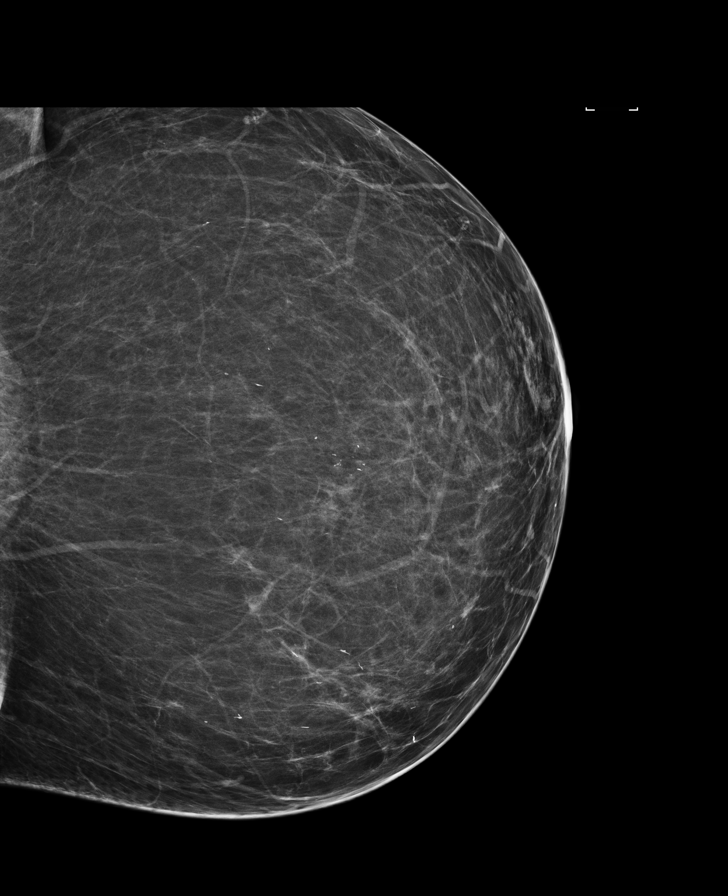

[R MLO]
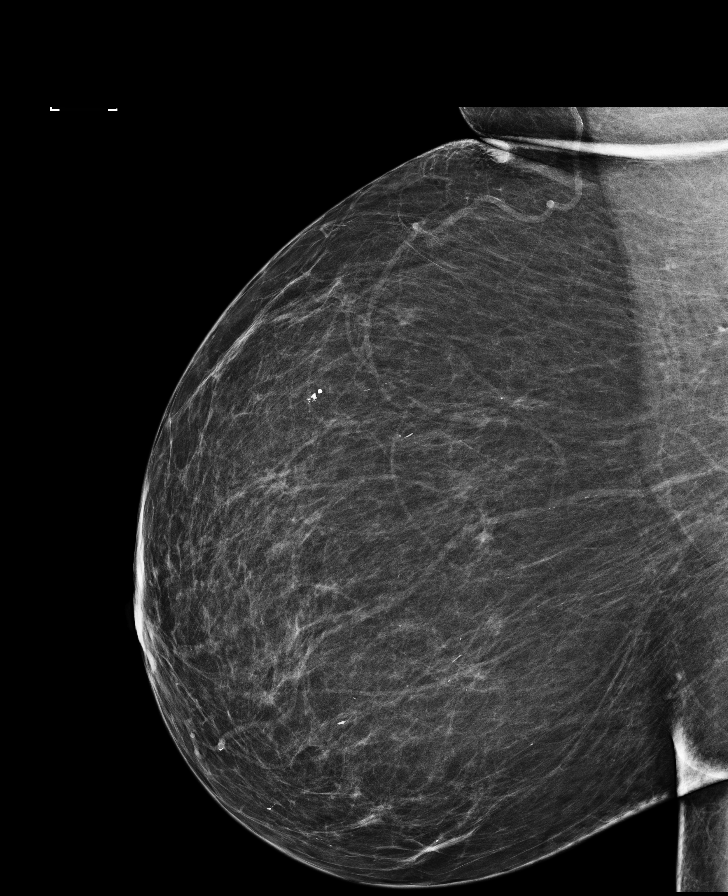

[8 of 31 positions shown; findings below may reference images not displayed]

ACR Breast Density Category b: There are scattered areas of
fibroglandular density.
FINDINGS: 2D and 3D full field views of both breasts and magnification views
of the left breast are performed.

A loose group of calcifications within the upper central left breast
now appear more coarse.

No suspicious calcifications are present.

No worrisome mass or distortion identified.

Mammographic images were processed with CAD.
IMPRESSION: 1. Benign appearing left breast calcifications. No further imaging
follow-up recommended given near stability for over 3 years.
2. No mammographic evidence of breast malignancy.

RECOMMENDATION:
Bilateral screening mammograms in 1 year.

I have discussed the findings and recommendations with the patient.
Results were also provided in writing at the conclusion of the
visit. If applicable, a reminder letter will be sent to the patient
regarding the next appointment.

BI-RADS CATEGORY  2: Benign.

## 2018-12-15 ENCOUNTER — Telehealth: Payer: Self-pay

## 2018-12-15 NOTE — Telephone Encounter (Signed)

## 2019-01-01 ENCOUNTER — Other Ambulatory Visit: Payer: Self-pay | Admitting: Family Medicine

## 2019-01-06 ENCOUNTER — Telehealth: Payer: Self-pay

## 2019-01-06 ENCOUNTER — Telehealth: Payer: Self-pay | Admitting: Family Medicine

## 2019-01-06 DIAGNOSIS — E1121 Type 2 diabetes mellitus with diabetic nephropathy: Secondary | ICD-10-CM

## 2019-01-06 MED ORDER — ACCU-CHEK FASTCLIX LANCETS MISC: 1.0000 [IU] | Freq: Two times a day (BID) | 5 refills | Status: DC

## 2019-01-06 NOTE — Telephone Encounter (Signed)
PT is calling she needs Lancets sent in

## 2019-01-06 NOTE — Telephone Encounter (Signed)
Stephanie Singleton, CMA  

## 2019-01-06 NOTE — Telephone Encounter (Signed)
Lancets has been called in to her pharmacy

## 2019-01-07 ENCOUNTER — Ambulatory Visit: Payer: Medicare Other

## 2019-01-14 DIAGNOSIS — D4101 Neoplasm of uncertain behavior of right kidney: Secondary | ICD-10-CM | POA: Diagnosis not present

## 2019-01-14 DIAGNOSIS — R1033 Periumbilical pain: Secondary | ICD-10-CM | POA: Diagnosis not present

## 2019-01-19 ENCOUNTER — Ambulatory Visit: Payer: Medicare Other

## 2019-01-20 ENCOUNTER — Telehealth: Payer: Self-pay

## 2019-01-20 ENCOUNTER — Ambulatory Visit (INDEPENDENT_AMBULATORY_CARE_PROVIDER_SITE_OTHER): Payer: Medicare Other | Admitting: Family Medicine

## 2019-01-20 ENCOUNTER — Encounter: Payer: Self-pay | Admitting: Family Medicine

## 2019-01-20 ENCOUNTER — Other Ambulatory Visit: Payer: Self-pay

## 2019-01-20 VITALS — BP 130/82 | HR 60 | Resp 12 | Ht 66.0 in | Wt 215.0 lb

## 2019-01-20 DIAGNOSIS — Z0000 Encounter for general adult medical examination without abnormal findings: Secondary | ICD-10-CM | POA: Diagnosis not present

## 2019-01-20 NOTE — Patient Instructions (Signed)
Stephanie Singleton , Thank you for taking time to come for your Medicare Wellness Visit. I appreciate your ongoing commitment to your health goals. Please review the following plan we discussed and let me know if I can assist you in the future.   Screening recommendations/referrals: Colonoscopy: Due 2025  Mammogram:  Due Oct 2020 Bone Density: Completed Recommended yearly ophthalmology/optometry visit for glaucoma screening and checkup Recommended yearly dental visit for hygiene and checkup  Vaccinations: Influenza vaccine: Due Fall 2020 (decline) Pneumococcal vaccine: Past Due (decline) Tdap vaccine: Due 2016 Shingles vaccine: Declines  Advanced directives: Decline info  Conditions/risks identified: Fall   Next appointment: 03/26/2019    Preventive Care 17 Years and Older, Female Preventive care refers to lifestyle choices and visits with your health care provider that can promote health and wellness. What does preventive care include?  A yearly physical exam. This is also called an annual well check.  Dental exams once or twice a year.  Routine eye exams. Ask your health care provider how often you should have your eyes checked.  Personal lifestyle choices, including:  Daily care of your teeth and gums.  Regular physical activity.  Eating a healthy diet.  Avoiding tobacco and drug use.  Limiting alcohol use.  Practicing safe sex.  Taking low-dose aspirin every day.  Taking vitamin and mineral supplements as recommended by your health care provider. What happens during an annual well check? The services and screenings done by your health care provider during your annual well check will depend on your age, overall health, lifestyle risk factors, and family history of disease. Counseling  Your health care provider may ask you questions about your:  Alcohol use.  Tobacco use.  Drug use.  Emotional well-being.  Home and relationship well-being.  Sexual  activity.  Eating habits.  History of falls.  Memory and ability to understand (cognition).  Work and work Statistician.  Reproductive health. Screening  You may have the following tests or measurements:  Height, weight, and BMI.  Blood pressure.  Lipid and cholesterol levels. These may be checked every 5 years, or more frequently if you are over 108 years old.  Skin check.  Lung cancer screening. You may have this screening every year starting at age 69 if you have a 30-pack-year history of smoking and currently smoke or have quit within the past 15 years.  Fecal occult blood test (FOBT) of the stool. You may have this test every year starting at age 16.  Flexible sigmoidoscopy or colonoscopy. You may have a sigmoidoscopy every 5 years or a colonoscopy every 10 years starting at age 61.  Hepatitis C blood test.  Hepatitis B blood test.  Sexually transmitted disease (STD) testing.  Diabetes screening. This is done by checking your blood sugar (glucose) after you have not eaten for a while (fasting). You may have this done every 1-3 years.  Bone density scan. This is done to screen for osteoporosis. You may have this done starting at age 24.  Mammogram. This may be done every 1-2 years. Talk to your health care provider about how often you should have regular mammograms. Talk with your health care provider about your test results, treatment options, and if necessary, the need for more tests. Vaccines  Your health care provider may recommend certain vaccines, such as:  Influenza vaccine. This is recommended every year.  Tetanus, diphtheria, and acellular pertussis (Tdap, Td) vaccine. You may need a Td booster every 10 years.  Zoster vaccine. You may  need this after age 27.  Pneumococcal 13-valent conjugate (PCV13) vaccine. One dose is recommended after age 106.  Pneumococcal polysaccharide (PPSV23) vaccine. One dose is recommended after age 73. Talk to your health care  provider about which screenings and vaccines you need and how often you need them. This information is not intended to replace advice given to you by your health care provider. Make sure you discuss any questions you have with your health care provider. Document Released: 07/15/2015 Document Revised: 03/07/2016 Document Reviewed: 04/19/2015 Elsevier Interactive Patient Education  2017 [place] Prevention in the Home Falls can cause injuries. They can happen to people of all ages. There are many things you can do to make your home safe and to help prevent falls. What can I do on the outside of my home?  Regularly fix the edges of walkways and driveways and fix any cracks.  Remove anything that might make you trip as you walk through a door, such as a raised step or threshold.  Trim any bushes or trees on the path to your home.  Use bright outdoor lighting.  Clear any walking paths of anything that might make someone trip, such as rocks or tools.  Regularly check to see if handrails are loose or broken. Make sure that both sides of any steps have handrails.  Any raised decks and porches should have guardrails on the edges.  Have any leaves, snow, or ice cleared regularly.  Use sand or salt on walking paths during winter.  Clean up any spills in your garage right away. This includes oil or grease spills. What can I do in the bathroom?  Use night lights.  Install grab bars by the toilet and in the tub and shower. Do not use towel bars as grab bars.  Use non-skid mats or decals in the tub or shower.  If you need to sit down in the shower, use a plastic, non-slip stool.  Keep the floor dry. Clean up any water that spills on the floor as soon as it happens.  Remove soap buildup in the tub or shower regularly.  Attach bath mats securely with double-sided non-slip rug tape.  Do not have throw rugs and other things on the floor that can make you trip. What can I do in  the bedroom?  Use night lights.  Make sure that you have a light by your bed that is easy to reach.  Do not use any sheets or blankets that are too big for your bed. They should not hang down onto the floor.  Have a firm chair that has side arms. You can use this for support while you get dressed.  Do not have throw rugs and other things on the floor that can make you trip. What can I do in the kitchen?  Clean up any spills right away.  Avoid walking on wet floors.  Keep items that you use a lot in easy-to-reach places.  If you need to reach something above you, use a strong step stool that has a grab bar.  Keep electrical cords out of the way.  Do not use floor polish or wax that makes floors slippery. If you must use wax, use non-skid floor wax.  Do not have throw rugs and other things on the floor that can make you trip. What can I do with my stairs?  Do not leave any items on the stairs.  Make sure that there are handrails on both sides  of the stairs and use them. Fix handrails that are broken or loose. Make sure that handrails are as long as the stairways.  Check any carpeting to make sure that it is firmly attached to the stairs. Fix any carpet that is loose or worn.  Avoid having throw rugs at the top or bottom of the stairs. If you do have throw rugs, attach them to the floor with carpet tape.  Make sure that you have a light switch at the top of the stairs and the bottom of the stairs. If you do not have them, ask someone to add them for you. What else can I do to help prevent falls?  Wear shoes that:  Do not have high heels.  Have rubber bottoms.  Are comfortable and fit you well.  Are closed at the toe. Do not wear sandals.  If you use a stepladder:  Make sure that it is fully opened. Do not climb a closed stepladder.  Make sure that both sides of the stepladder are locked into place.  Ask someone to hold it for you, if possible.  Clearly mark and  make sure that you can see:  Any grab bars or handrails.  First and last steps.  Where the edge of each step is.  Use tools that help you move around (mobility aids) if they are needed. These include:  Canes.  Walkers.  Scooters.  Crutches.  Turn on the lights when you go into a dark area. Replace any light bulbs as soon as they burn out.  Set up your furniture so you have a clear path. Avoid moving your furniture around.  If any of your floors are uneven, fix them.  If there are any pets around you, be aware of where they are.  Review your medicines with your doctor. Some medicines can make you feel dizzy. This can increase your chance of falling. Ask your doctor what other things that you can do to help prevent falls. This information is not intended to replace advice given to you by your health care provider. Make sure you discuss any questions you have with your health care provider. Document Released: 04/14/2009 Document Revised: 11/24/2015 Document Reviewed: 07/23/2014 Elsevier Interactive Patient Education  2017 [employer].

## 2019-01-20 NOTE — Telephone Encounter (Signed)

## 2019-01-20 NOTE — Progress Notes (Signed)
Subjective:   Stephanie Singleton is a 73 y.o. female who presents for Medicare Annual (Subsequent) preventive examination.  Location of Patient: Home Location of Provider: Telehealth Consent was obtain for visit to be over via telehealth. I verified that I am speaking with the correct person using two identifiers.  Review of Systems:    Cardiac Risk Factors include: advanced age (>42men, >6 women);diabetes mellitus;dyslipidemia;hypertension     Objective:     Vitals: BP 130/82   Pulse 60   Resp 12   Ht 5\' 6"  (1.676 m)   Wt 215 lb (97.5 kg)   BMI 34.70 kg/m   Body mass index is 34.7 kg/m.  Advanced Directives 01/15/2018 08/07/2016 10/31/2014 09/07/2014 05/01/2011  Does Patient Have a Medical Advance Directive? No No No No Patient does not have advance directive;Patient would not like information  Would patient like information on creating a medical advance directive? No - Patient declined No - Patient declined No - patient declined information Yes - Scientist, clinical (histocompatibility and immunogenetics) given -    Tobacco Social History   Tobacco Use  Smoking Status Former Smoker  . Packs/day: 0.25  . Years: 20.00  . Pack years: 5.00  . Quit date: 08/07/1996  . Years since quitting: 22.4  Smokeless Tobacco Never Used     Counseling given: Yes   Clinical Intake:  Pre-visit preparation completed: Yes  Pain : No/denies pain Pain Score: 0-No pain     BMI - recorded: 34.7 Nutritional Status: BMI > 30  Obese Nutritional Risks: None Diabetes: Yes CBG done?: No Did pt. bring in CBG monitor from home?: No  How often do you need to have someone help you when you read instructions, pamphlets, or other written materials from your doctor or pharmacy?: 1 - Never What is the last grade level you completed in school?: college  Interpreter Needed?: No     Past Medical History:  Diagnosis Date  . Chronic diastolic CHF (congestive heart failure) ([place])   . CKD (chronic kidney disease), stage III ([place])    . Diabetes mellitus DX X 1 YR   USE METFORMIN ACCORDING TO CBG RESULT  . Dilatation of aorta ([facility])   . Embolism - blood clot 2004   Pulmonary .on coumadin  . GERD (gastroesophageal reflux disease)    ON PREVACID  . HTN (hypertension)    EKG 10/23 IN EPIC  . Hyperlipidemia   . Hypertension    CARDIOLOGY VISIT.CARDIAC CLEARANCE.LOVENOX INSTRUCTIONS.  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 OSA (obstructive sleep apnea) DX 8 YRS AGO.DOES NOT WEAR MASK  . Pulmonary embolism ([place])    a. 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  . Tumor of soft tissue of neck    Past Surgical History:  Procedure Laterality Date  . CERVICAL FUSION     Family History  Problem Relation Age of Onset  . Heart disease Mother   . Hypertension Mother   . Stroke Mother   . Diabetes Father   . Peripheral vascular disease Father   . Peripheral vascular disease Brother   . Heart disease Brother   . Bladder Cancer Sister 6  . Diabetes Daughter        3 of the 5 daughters  . Diabetes Son        3 of the sons  . Breast cancer Sister 35  . Liver disease Son   . Colon cancer Neg Hx   .  Pancreatic cancer Neg Hx   . Stomach cancer Neg Hx   . Esophageal cancer Neg Hx    Social History   Socioeconomic History  . Marital status: Divorced    Spouse name: Not on file  . Number of children: 54  . Years of education: Not on file  . Highest education level: Some college, no degree  Occupational History    Employer: DISABLED     Comment: [employer]  Social Needs  . Financial resource strain: Not hard at all  . Food insecurity    Worry: Never true    Inability: Never true  . Transportation needs    Medical: No    Non-medical: No  Tobacco Use  . Smoking status: Former Smoker    Packs/day: 0.25    Years: 20.00    Pack years: 5.00    Quit date: 08/07/1996    Years since quitting: 22.4  . Smokeless tobacco: Never Used  Substance and Sexual  Activity  . Alcohol use: No    Comment: upper limits of week, 1-2 daily- until 07/2018, no longer having alcohol per patient  . Drug use: No  . Sexual activity: Yes  Lifestyle  . Physical activity    Days per week: 0 days    Minutes per session: 0 min  . Stress: Not at all  Relationships  . Social connections    Talks on phone: More than three times a week    Gets together: More than three times a week    Attends religious service: More than 4 times per year    Active member of club or organization: No    Attends meetings of clubs or organizations: Never    Relationship status: Divorced  Other Topics Concern  . Not on file  Social History Narrative  . Not on file    Outpatient Encounter Medications as of 01/20/2019  Medication Sig  . ACCU-CHEK AVIVA PLUS test strip USE TO TEST ONCE DAILY  . Accu-Chek FastClix Lancets MISC 1 Units by Does not apply route 2 (two) times a day.  . cetirizine (ZYRTEC) 10 MG tablet Take 1 tablet (10 mg total) by mouth daily.  Marland Kitchen glipiZIDE (GLUCOTROL XL) 2.5 MG 24 hr tablet TAKE 1 TABLET (2.5 MG TOTAL) BY MOUTH DAILY WITH BREAKFAST.  . hydrochlorothiazide (HYDRODIURIL) 25 MG tablet Take 1 tablet (25 mg total) by mouth daily.  Marland Kitchen HYDROcodone-acetaminophen (NORCO/VICODIN) 5-325 MG tablet Take 1 tablet by mouth 2 (two) times daily as needed (pain).  . hydrOXYzine (ATARAX/VISTARIL) 50 MG tablet TAKE 1 TABLET BY MOUTH EVERYDAY AT BEDTIME  . isosorbide mononitrate (IMDUR) 30 MG 24 hr tablet Take 1 tablet (30 mg total) by mouth daily. ** DO NOT CRUSH **  . KLOR-CON M20 20 MEQ tablet TAKE 1 TABLET TWICE A WEEK WHEN YOU TAKE DEMADEX (TORSEMIDE)  . olmesartan (BENICAR) 40 MG tablet Take 1 tablet (40 mg total) by mouth daily.  . pantoprazole (PROTONIX) 40 MG tablet TAKE 1 TABLET BY MOUTH EVERY DAY  . pravastatin (PRAVACHOL) 20 MG tablet Take 1 tablet (20 mg total) by mouth daily.  Marland Kitchen tiZANidine (ZANAFLEX) 4 MG tablet TAKE ONE TABLET TWO TIMES DAILY FOR 1 WEEK, THEN AT  BEDTIME AS NEEDED  . torsemide (DEMADEX) 20 MG tablet TAKE 1 TABLET 2 TIMES A WEEK AS NEEDED FOR SWELLING  . TRADJENTA 5 MG TABS tablet TAKE 1 TABLET BY MOUTH EVERY DAY  . warfarin (COUMADIN) 2.5 MG tablet Take 1 tablet daily or as directed  by Coumadin clinic   No facility-administered encounter medications on file as of 01/20/2019.     Activities of Daily Living In your present state of health, do you have any difficulty performing the following activities: 01/20/2019  Hearing? N  Vision? N  Difficulty concentrating or making decisions? N  Walking or climbing stairs? Y  Dressing or bathing? N  Doing errands, shopping? N  Preparing Food and eating ? N  Using the Toilet? N  In the past six months, have you accidently leaked urine? N  Do you have problems with loss of bowel control? N  Managing your Medications? N  Managing your Finances? N  Housekeeping or managing your Housekeeping? N  Some recent data might be hidden    Patient Care Team: Fayrene Helper, MD as PCP - General Rothbart, Cristopher Estimable, MD (Cardiology) Trula Slade, DPM as Consulting Physician (Podiatry) Ashok Pall, MD as Consulting Physician (Neurosurgery)    Assessment:   This is a routine wellness examination for Stephanie Singleton.  Exercise Activities and Dietary recommendations Current Exercise Habits: The patient does not participate in regular exercise at present, Exercise limited by: orthopedic condition(s)  Goals    . eat 3 meals a day    . maintaining      Patient states that she wants to stay happy and healthy the way that she is       Fall Risk Fall Risk  01/20/2019 11/18/2018 10/08/2018 07/21/2018 04/01/2018  Falls in the past year? 0 0 0 0 No  Number falls in past yr: - - 0 0 -  Injury with Fall? 0 0 0 0 -  Follow up - - - - -   Is the patient's home free of loose throw rugs in walkways, pet beds, electrical cords, etc?   yes      Grab bars in the bathroom? yes      Handrails on the stairs?    yes      Adequate lighting?   yes     Depression Screen PHQ 2/9 Scores 01/20/2019 11/18/2018 07/21/2018 04/01/2018  PHQ - 2 Score 0 0 0 0  PHQ- 9 Score - - - -     Cognitive Function     6CIT Screen 01/20/2019 01/15/2018 08/07/2016  What Year? 0 points 0 points 0 points  What month? 0 points 0 points 0 points  What time? 0 points 0 points 0 points  Count back from 20 0 points 0 points 0 points  Months in reverse 0 points 4 points 0 points  Repeat phrase 0 points 0 points 0 points  Total Score 0 4 0    Immunization History  Administered Date(s) Administered  . Td 01/03/2005    Qualifies for Shingles Vaccine? declines  Screening Tests Health Maintenance  Topic Date Due  . PNA vac Low Risk Adult (1 of 2 - PCV13) 01/11/2013  . TETANUS/TDAP  01/04/2015  . OPHTHALMOLOGY EXAM  10/14/2017  . FOOT EXAM  11/05/2018  . HEMOGLOBIN A1C  01/13/2019  . INFLUENZA VACCINE  01/31/2019  . MAMMOGRAM  04/26/2019  . COLONOSCOPY  12/08/2023  . DEXA SCAN  Completed  . Hepatitis C Screening  Completed    Cancer Screenings: Lung: Low Dose CT Chest recommended if Age 36-80 years, 30 pack-year currently smoking OR have quit w/in 15years. Patient does not qualify. Breast:  Up to date on Mammogram? Yes   Up to date of Bone Density/Dexa? Yes Colorectal:  Due 2025  Additional Screenings:  Hepatitis C Screening: Completed      Plan:     1. Encounter for Medicare annual wellness exam  Pt declines flu, pna, and shingles vaccines  I have personally reviewed and noted the following in the patient's chart:   . Medical and social history . Use of alcohol, tobacco or illicit drugs  . Current medications and supplements . Functional ability and status . Nutritional status . Physical activity . Advanced directives . List of other physicians . Hospitalizations, surgeries, and ER visits in previous 12 months . Vitals . Screenings to include cognitive, depression, and falls . Referrals and  appointments  In addition, I have reviewed and discussed with patient certain preventive protocols, quality metrics, and best practice recommendations. A written personalized care plan for preventive services as well as general preventive health recommendations were provided to patient.     I provided 20 minutes of non-face-to-face time during this encounter.   Perlie Mayo, NP  01/20/2019

## 2019-01-22 ENCOUNTER — Other Ambulatory Visit: Payer: Medicare Other | Admitting: *Deleted

## 2019-01-22 ENCOUNTER — Other Ambulatory Visit: Payer: Self-pay

## 2019-01-22 ENCOUNTER — Ambulatory Visit (INDEPENDENT_AMBULATORY_CARE_PROVIDER_SITE_OTHER): Payer: Medicare Other | Admitting: *Deleted

## 2019-01-22 DIAGNOSIS — E785 Hyperlipidemia, unspecified: Secondary | ICD-10-CM | POA: Diagnosis not present

## 2019-01-22 DIAGNOSIS — I10 Essential (primary) hypertension: Secondary | ICD-10-CM | POA: Diagnosis not present

## 2019-01-22 DIAGNOSIS — E1121 Type 2 diabetes mellitus with diabetic nephropathy: Secondary | ICD-10-CM

## 2019-01-22 DIAGNOSIS — Z7901 Long term (current) use of anticoagulants: Secondary | ICD-10-CM | POA: Diagnosis not present

## 2019-01-22 DIAGNOSIS — R7302 Impaired glucose tolerance (oral): Secondary | ICD-10-CM | POA: Diagnosis not present

## 2019-01-22 LAB — COMPREHENSIVE METABOLIC PANEL
ALT: 12 IU/L (ref 0–32)
AST: 16 IU/L (ref 0–40)
Albumin/Globulin Ratio: 1.4 (ref 1.2–2.2)
Albumin: 4.2 g/dL (ref 3.7–4.7)
Alkaline Phosphatase: 100 IU/L (ref 39–117)
BUN/Creatinine Ratio: 14 (ref 12–28)
BUN: 16 mg/dL (ref 8–27)
Bilirubin Total: 0.3 mg/dL (ref 0.0–1.2)
CO2: 23 mmol/L (ref 20–29)
Calcium: 9.3 mg/dL (ref 8.7–10.3)
Chloride: 100 mmol/L (ref 96–106)
Creatinine, Ser: 1.14 mg/dL — ABNORMAL HIGH (ref 0.57–1.00)
GFR calc Af Amer: 56 mL/min/{1.73_m2} — ABNORMAL LOW (ref >59)
GFR calc non Af Amer: 48 mL/min/{1.73_m2} — ABNORMAL LOW (ref >59)
Globulin, Total: 2.9 g/dL (ref 1.5–4.5)
Glucose: 134 mg/dL — ABNORMAL HIGH (ref 65–99)
Potassium: 3.9 mmol/L (ref 3.5–5.2)
Sodium: 140 mmol/L (ref 134–144)
Total Protein: 7.1 g/dL (ref 6.0–8.5)

## 2019-01-22 LAB — POCT INR: INR: 2.4 (ref 2.0–3.0)

## 2019-01-22 LAB — LIPID PANEL
Chol/HDL Ratio: 3.8 ratio (ref 0.0–4.4)
Cholesterol, Total: 159 mg/dL (ref 100–199)
HDL: 42 mg/dL (ref >39)
LDL Calculated: 86 mg/dL (ref 0–99)
Triglycerides: 154 mg/dL — ABNORMAL HIGH (ref 0–149)
VLDL Cholesterol Cal: 31 mg/dL (ref 5–40)

## 2019-01-22 LAB — HEMOGLOBIN: Hemoglobin: 13.2 g/dL (ref 11.1–15.9)

## 2019-01-22 NOTE — Patient Instructions (Signed)
Description   Continue taking 2.5mg  daily.  Recheck INR in 8 weeks. Please call [phone] if you have any changes in your medications or up coming procedures.

## 2019-01-24 ENCOUNTER — Other Ambulatory Visit: Payer: Self-pay | Admitting: Cardiology

## 2019-01-24 DIAGNOSIS — E785 Hyperlipidemia, unspecified: Secondary | ICD-10-CM

## 2019-01-24 DIAGNOSIS — I739 Peripheral vascular disease, unspecified: Secondary | ICD-10-CM

## 2019-01-24 DIAGNOSIS — I119 Hypertensive heart disease without heart failure: Secondary | ICD-10-CM

## 2019-01-24 DIAGNOSIS — Z86711 Personal history of pulmonary embolism: Secondary | ICD-10-CM

## 2019-01-24 DIAGNOSIS — Z5181 Encounter for therapeutic drug level monitoring: Secondary | ICD-10-CM

## 2019-01-26 ENCOUNTER — Other Ambulatory Visit: Payer: Self-pay | Admitting: Neurosurgery

## 2019-01-26 DIAGNOSIS — D3611 Benign neoplasm of peripheral nerves and autonomic nervous system of face, head, and neck: Secondary | ICD-10-CM

## 2019-01-30 LAB — SPECIMEN STATUS REPORT

## 2019-01-30 LAB — HGB A1C W/O EAG: Hgb A1c MFr Bld: 7 % — ABNORMAL HIGH (ref 4.8–5.6)

## 2019-02-02 DIAGNOSIS — K8020 Calculus of gallbladder without cholecystitis without obstruction: Secondary | ICD-10-CM | POA: Diagnosis not present

## 2019-02-02 DIAGNOSIS — D4101 Neoplasm of uncertain behavior of right kidney: Secondary | ICD-10-CM | POA: Diagnosis not present

## 2019-02-02 DIAGNOSIS — N281 Cyst of kidney, acquired: Secondary | ICD-10-CM | POA: Diagnosis not present

## 2019-02-04 DIAGNOSIS — N281 Cyst of kidney, acquired: Secondary | ICD-10-CM | POA: Diagnosis not present

## 2019-02-04 DIAGNOSIS — R1033 Periumbilical pain: Secondary | ICD-10-CM | POA: Diagnosis not present

## 2019-02-18 ENCOUNTER — Other Ambulatory Visit: Payer: Self-pay | Admitting: Family Medicine

## 2019-02-21 ENCOUNTER — Other Ambulatory Visit: Payer: Self-pay | Admitting: Cardiology

## 2019-02-21 DIAGNOSIS — I739 Peripheral vascular disease, unspecified: Secondary | ICD-10-CM

## 2019-02-21 DIAGNOSIS — E785 Hyperlipidemia, unspecified: Secondary | ICD-10-CM

## 2019-02-21 DIAGNOSIS — I119 Hypertensive heart disease without heart failure: Secondary | ICD-10-CM

## 2019-02-21 DIAGNOSIS — Z86711 Personal history of pulmonary embolism: Secondary | ICD-10-CM

## 2019-02-21 DIAGNOSIS — Z5181 Encounter for therapeutic drug level monitoring: Secondary | ICD-10-CM

## 2019-02-26 ENCOUNTER — Ambulatory Visit: Payer: Medicare Other | Admitting: Cardiology

## 2019-03-23 NOTE — Progress Notes (Signed)
Virtual Visit via Telephone Note   This visit type was conducted due to national recommendations for restrictions regarding the COVID-19 Pandemic (e.g. social distancing) in an effort to limit this patient's exposure and mitigate transmission in our community.  Due to her co-morbid illnesses, this patient is at least at moderate risk for complications without adequate follow up.  This format is felt to be most appropriate for this patient at this time.  The patient did not have access to video technology/had technical difficulties with video requiring transitioning to audio format only (telephone).  All issues noted in this document were discussed and addressed.  No physical exam could be performed with this format.  Please refer to the patient's chart for her  consent to telehealth for [facility].   Date:  03/24/2019   ID:  Stephanie Singleton, DOB 1946/03/08, MRN [id number]  Patient Location: Home Provider Location: Home  PCP:  Fayrene Helper, MD  Cardiologist:  Ena Dawley, MD  Electrophysiologist:  None   Evaluation Performed:  Follow-Up Visit  Chief Complaint:  Routine overdue f/u of chronic diastolic CHF  History of Present Illness:    Stephanie Singleton is a 73 y.o. female with remote saddle PE in 2751, chronic diastolic CHF with chronic lower extremity edema, mildly dilated ascending aorta, DM, GERD, HTN, HLD (followed by PCP), OSA (previously noncompliant with CPAP), probable CKD III per labs who presents for routine follow-up virtually. Repeat CTA 2010 was negative for recurrent PE. She was remotely followed by Jory Sims but established with Dr. Meda Coffee in 04/2016. LE arterial duplex for leg pain 05/2016 was normal. I met her in clinic in 11/2016 at which time she was reporting chronic fatigue x 15 years with progression, as well as gradual DOE over the last year. She had remarried about 2 years prior and reported she'd slowed down a lot and gained about 30lb. Of  note, she also has 13 children which includes 3 sets of twins. 2D Echo 01/08/17 showed EF 60-65%, grade 1 DD, mildly dilated ascending aorta, no significant pulm HTN, normal CVP. Nuc 01/08/17 was normal. Referral to pulm recommended but not pursued by the patient. She previously self-discontinued amlodipine due to lower exremity edema. At last OV 04/2017 with Dr. Meda Coffee, Lyrica was prescribed for lower extremity pain/neuropathy. She was eventually able to stop this. Last labs 12/2018 showed A1C 7.0, K 3.9, Cr 1.14, normal LFTs, LDL 86, Hgb 13.2.  She returns for follow-up virtually overall feeling great. She has no further SOB. She is only having to use the torsemide sparingly as edema has been well controlled. No CP, palpitations, orthopnea, syncope. Her blood pressure tends to run 120s-130s/70s-80s per her report. She has previously been introduced to the idea of switching to [place] but wishes to remain on Coumadin.  The patient does not have symptoms concerning for COVID-19 infection (fever, chills, cough, or new shortness of breath).    Past Medical History:  Diagnosis Date   Chronic diastolic CHF (congestive heart failure) (HCC)    CKD (chronic kidney disease), stage III (HCC)    Diabetes mellitus DX X 1 YR   USE METFORMIN ACCORDING TO CBG RESULT   Dilatation of aorta (HCC)    Embolism - blood clot 2004   Pulmonary .on coumadin   GERD (gastroesophageal reflux disease)    ON PREVACID   HTN (hypertension)    EKG 10/23 IN EPIC   Hyperlipidemia    Hypertension    CARDIOLOGY VISIT.CARDIAC CLEARANCE.LOVENOX INSTRUCTIONS.  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   Tumor of soft tissue of neck    Past Surgical History:  Procedure Laterality Date   CERVICAL FUSION       Current Meds    Medication Sig   ACCU-CHEK AVIVA PLUS test strip USE TO TEST ONCE DAILY   Accu-Chek FastClix Lancets MISC 1 Units by Does not apply route 2 (two) times a day.   cetirizine (ZYRTEC) 10 MG tablet Take 1 tablet (10 mg total) by mouth daily.   glipiZIDE (GLUCOTROL XL) 2.5 MG 24 hr tablet TAKE 1 TABLET (2.5 MG TOTAL) BY MOUTH DAILY WITH BREAKFAST.   hydrochlorothiazide (HYDRODIURIL) 25 MG tablet Take 1 tablet (25 mg total) by mouth daily.   HYDROcodone-acetaminophen (NORCO/VICODIN) 5-325 MG tablet Take 1 tablet by mouth 2 (two) times daily as needed (pain).   hydrOXYzine (ATARAX/VISTARIL) 50 MG tablet TAKE 1 TABLET BY MOUTH EVERYDAY AT BEDTIME   KLOR-CON M20 20 MEQ tablet TAKE 1 TABLET TWICE A WEEK WHEN YOU TAKE DEMADEX (TORSEMIDE)   olmesartan (BENICAR) 40 MG tablet Take 1 tablet (40 mg total) by mouth daily.   pantoprazole (PROTONIX) 40 MG tablet TAKE 1 TABLET BY MOUTH EVERY DAY   pravastatin (PRAVACHOL) 20 MG tablet Take 1 tablet (20 mg total) by mouth daily.   tiZANidine (ZANAFLEX) 4 MG tablet TAKE ONE TABLET TWO TIMES DAILY FOR 1 WEEK, THEN AT BEDTIME AS NEEDED   torsemide (DEMADEX) 20 MG tablet TAKE 1 TABLET 2 TIMES A WEEK AS NEEDED FOR SWELLING   TRADJENTA 5 MG TABS tablet TAKE 1 TABLET BY MOUTH EVERY DAY   warfarin (COUMADIN) 2.5 MG tablet Take 1 tablet daily or as directed by Coumadin clinic     Allergies:   Aspirin and Lisinopril   Social History   Tobacco Use   Smoking status: Former Smoker    Packs/day: 0.25    Years: 20.00    Pack years: 5.00    Quit date: 08/07/1996    Years since quitting: 22.6   Smokeless tobacco: Never Used  Substance Use Topics   Alcohol use: No    Comment: upper limits of week, 1-2 daily- until 07/2018, no longer having alcohol per patient   Drug use: No     Family Hx: The patient's family history includes Bladder Cancer (age of onset: 61) in her sister; Breast cancer (age of onset: 74) in her sister; Diabetes in her daughter,  father, and son; Heart disease in her brother and mother; Hypertension in her mother; Liver disease in her son; Peripheral vascular disease in her brother and father; Stroke in her mother. There is no history of Colon cancer, Pancreatic cancer, Stomach cancer, or Esophageal cancer.  ROS:   Please see the history of present illness.    All other systems reviewed and are negative.   Prior CV studies:    Most recent pertinent cardiac studies are outlined above.  Labs/Other Tests and Data Reviewed:    EKG:  An ECG dated 12/04/2016 was personally reviewed today and demonstrated:  NSR 87bpm, LVH, nonspecific TW changes, Qtc 42m  Recent Labs: 04/01/2018: TSH 0.818 07/15/2018: Platelets WILL FOLLOW 01/22/2019: ALT 12; BUN 16; Creatinine, Ser 1.14; Hemoglobin 13.2; Potassium 3.9; Sodium 140   Recent Lipid Panel Lab  Results  Component Value Date/Time   CHOL 159 01/22/2019 09:52 AM   TRIG 154 (H) 01/22/2019 09:52 AM   HDL 42 01/22/2019 09:52 AM   CHOLHDL 3.8 01/22/2019 09:52 AM   CHOLHDL 4.0 06/27/2016 02:28 PM   LDLCALC 86 01/22/2019 09:52 AM    Wt Readings from Last 3 Encounters:  03/24/19 220 lb (99.8 kg)  01/20/19 215 lb (97.5 kg)  11/18/18 215 lb (97.5 kg)     Objective:    Vital Signs:  BP 132/87    Ht '5\' 6"'  (1.676 m)    Wt 220 lb (99.8 kg)    BMI 35.51 kg/m    VS reviewed. General - pleasant F in no acute distress Pulm - No labored breathing, no coughing during visit, no audible wheezing, speaking in full sentences Neuro - A+Ox3, no slurred speech, answers questions appropriately Psych - Pleasant affect   ASSESSMENT & PLAN:    1. Chronic diastolic CHF - clinically patient reports excellent control of symptoms, therefore continue present regimen. She is only taking torsemide sparingly at this point since she's felt well. Reviewed 2g sodium restriction, 2L fluid restriction, daily weights with patient. She is due for an EKG. I offered Remote Health option to get a rhythm  strip but she declines and would like to see if primary care would get one at her 04/2019 OV. We will be able to evaluate her rhythm on echocardiogram (below). 2. Mildly dilated ascending aorta - due for repeat echocardiogram, will arrange. 3. Hypertension - BP right on the cusp of control. The patient was instructed to monitor their blood pressure at home and to call if tending to run higher than 130/80. She sees PCP next month at which time I asked her to bring a log of her readings for their review. 4. History of PE - declines switch to DOAC. (Per Coumadin clinic note, this was previously approved by Dr. Meda Coffee but the patient wishes to continue Coumadin. Her INRs have been stable.)  COVID-19 Education: The signs and symptoms of COVID-19 were discussed with the patient and how to seek care for testing (follow up with PCP or arrange E-visit).  The importance of social distancing was discussed today.  Time:   Today, I have spent 17 minutes with the patient with telehealth technology discussing the above problems.     Medication Adjustments/Labs and Tests Ordered: Current medicines are reviewed at length with the patient today.  Concerns regarding medicines are outlined above.   Disposition:  Follow up in 6 months in office physically with Dr. Meda Coffee.  Signed, Charlie Pitter, PA-C  03/24/2019 2:59 PM    [facility]

## 2019-03-24 ENCOUNTER — Other Ambulatory Visit: Payer: Self-pay

## 2019-03-24 ENCOUNTER — Encounter: Payer: Self-pay | Admitting: Physician Assistant

## 2019-03-24 ENCOUNTER — Telehealth: Payer: Self-pay

## 2019-03-24 ENCOUNTER — Telehealth (INDEPENDENT_AMBULATORY_CARE_PROVIDER_SITE_OTHER): Payer: Medicare Other | Admitting: Physician Assistant

## 2019-03-24 VITALS — BP 132/87 | Ht 66.0 in | Wt 220.0 lb

## 2019-03-24 DIAGNOSIS — Z86711 Personal history of pulmonary embolism: Secondary | ICD-10-CM

## 2019-03-24 DIAGNOSIS — I10 Essential (primary) hypertension: Secondary | ICD-10-CM

## 2019-03-24 DIAGNOSIS — I77810 Thoracic aortic ectasia: Secondary | ICD-10-CM

## 2019-03-24 DIAGNOSIS — I5032 Chronic diastolic (congestive) heart failure: Secondary | ICD-10-CM

## 2019-03-24 NOTE — Telephone Encounter (Signed)
Spoke with pt who gave verbal consent for virtual phone visit for 03/24/19...jb

## 2019-03-24 NOTE — Patient Instructions (Signed)
Medication Instructions:  Your physician recommends that you continue on your current medications as directed. Please refer to the Current Medication list given to you today.  If you need a refill on your cardiac medications before your next appointment, please call your pharmacy.   Lab work: None ordered  If you have labs (blood work) drawn today and your tests are completely normal, you will receive your results only by: Marland Kitchen MyChart Message (if you have MyChart) OR . A paper copy in the mail If you have any lab test that is abnormal or we need to change your treatment, we will call you to review the results.  Testing/Procedures: Your physician has requested that you have an echocardiogram (SOMEONE WILL CALL YOU TO ARRANGE THIS) . Echocardiography is a painless test that uses sound waves to create images of your heart. It provides your doctor with information about the size and shape of your heart and how well your heart's chambers and valves are working. This procedure takes approximately one hour. There are no restrictions for this procedure.    Follow-Up: At [facility], you and your health needs are our priority.  As part of our continuing mission to provide you with exceptional heart care, we have created designated Provider Care Teams.  These Care Teams include your primary Cardiologist (physician) and Advanced Practice Providers (APPs -  Physician Assistants and Nurse Practitioners) who all work together to provide you with the care you need, when you need it. You will need a follow up appointment in 6 months.  Please call our office 2 months in advance to schedule this appointment.  You may see Ena Dawley, MD or one of the following Advanced Practice Providers on your designated Care Team:   [place], PA-C Melina Copa, PA-C . Ermalinda Barrios, PA-C  Any Other Special Instructions Will Be Listed Below (If Applicable).   Echocardiogram An echocardiogram is a procedure  that uses painless sound waves (ultrasound) to produce an image of the heart. Images from an echocardiogram can provide important information about:  Signs of coronary artery disease (CAD).  Aneurysm detection. An aneurysm is a weak or damaged part of an artery wall that bulges out from the normal force of blood pumping through the body.  Heart size and shape. Changes in the size or shape of the heart can be associated with certain conditions, including heart failure, aneurysm, and CAD.  Heart muscle function.  Heart valve function.  Signs of a past heart attack.  Fluid buildup around the heart.  Thickening of the heart muscle.  A tumor or infectious growth around the heart valves. Tell a health care provider about:  Any allergies you have.  All medicines you are taking, including vitamins, herbs, eye drops, creams, and over-the-counter medicines.  Any blood disorders you have.  Any surgeries you have had.  Any medical conditions you have.  Whether you are pregnant or may be pregnant. What are the risks? Generally, this is a safe procedure. However, problems may occur, including:  Allergic reaction to dye (contrast) that may be used during the procedure. What happens before the procedure? No specific preparation is needed. You may eat and drink normally. What happens during the procedure?   An IV tube may be inserted into one of your veins.  You may receive contrast through this tube. A contrast is an injection that improves the quality of the pictures from your heart.  A gel will be applied to your chest.  A wand-like  tool (transducer) will be moved over your chest. The gel will help to transmit the sound waves from the transducer.  The sound waves will harmlessly bounce off of your heart to allow the heart images to be captured in real-time motion. The images will be recorded on a computer. The procedure may vary among health care providers and hospitals. What  happens after the procedure?  You may return to your normal, everyday life, including diet, activities, and medicines, unless your health care provider tells you not to do that. Summary  An echocardiogram is a procedure that uses painless sound waves (ultrasound) to produce an image of the heart.  Images from an echocardiogram can provide important information about the size and shape of your heart, heart muscle function, heart valve function, and fluid buildup around your heart.  You do not need to do anything to prepare before this procedure. You may eat and drink normally.  After the echocardiogram is completed, you may return to your normal, everyday life, unless your health care provider tells you not to do that. This information is not intended to replace advice given to you by your health care provider. Make sure you discuss any questions you have with your health care provider. Document Released: 06/15/2000 Document Revised: 10/09/2018 Document Reviewed: 07/21/2016 Elsevier Patient Education  2020 [employer].

## 2019-03-26 ENCOUNTER — Encounter: Payer: Medicare Other | Admitting: Family Medicine

## 2019-03-30 ENCOUNTER — Other Ambulatory Visit: Payer: Self-pay | Admitting: Family Medicine

## 2019-04-09 DIAGNOSIS — H40032 Anatomical narrow angle, left eye: Secondary | ICD-10-CM | POA: Diagnosis not present

## 2019-04-09 DIAGNOSIS — H25013 Cortical age-related cataract, bilateral: Secondary | ICD-10-CM | POA: Diagnosis not present

## 2019-04-13 ENCOUNTER — Encounter: Payer: Self-pay | Admitting: Family Medicine

## 2019-04-13 ENCOUNTER — Other Ambulatory Visit: Payer: Self-pay

## 2019-04-13 ENCOUNTER — Ambulatory Visit (INDEPENDENT_AMBULATORY_CARE_PROVIDER_SITE_OTHER): Payer: Medicare Other | Admitting: Family Medicine

## 2019-04-13 VITALS — BP 160/94 | HR 86 | Temp 98.1°F | Ht 66.0 in | Wt 223.0 lb

## 2019-04-13 DIAGNOSIS — R490 Dysphonia: Secondary | ICD-10-CM | POA: Insufficient documentation

## 2019-04-13 DIAGNOSIS — M5410 Radiculopathy, site unspecified: Secondary | ICD-10-CM | POA: Diagnosis not present

## 2019-04-13 DIAGNOSIS — Z2821 Immunization not carried out because of patient refusal: Secondary | ICD-10-CM

## 2019-04-13 DIAGNOSIS — I10 Essential (primary) hypertension: Secondary | ICD-10-CM

## 2019-04-13 DIAGNOSIS — E559 Vitamin D deficiency, unspecified: Secondary | ICD-10-CM

## 2019-04-13 DIAGNOSIS — E1121 Type 2 diabetes mellitus with diabetic nephropathy: Secondary | ICD-10-CM

## 2019-04-13 DIAGNOSIS — Z0000 Encounter for general adult medical examination without abnormal findings: Secondary | ICD-10-CM

## 2019-04-13 NOTE — Progress Notes (Signed)
Stephanie Singleton     MRN: [id number]      DOB: 29-Nov-1945  HPI: Patient is in for annual physical exam. Increased low back pain radiaitng to legs is  Concern, at times making it impossibled or addressed at the visit. Recent labs, if available are reviewed. Immunization is reviewed , and  updated if needed.   PE: BP (!) 138/94   Pulse 86   Temp 98.1 F (36.7 C) (Temporal)   Wt 223 lb (101.2 kg)   SpO2 98%   BMI 35.99 kg/m  Pleasant  female, alert and oriented x 3, in no cardio-pulmonary distress. Afebrile. HEENT No facial trauma or asymetry. Sinuses non tender.  Extra occullar muscles intact.. External ears normal, . Neck: supple, no adenopathy,JVD or thyromegaly.No bruits.  Chest: Clear to ascultation bilaterally.No crackles or wheezes. Non tender to palpation  Breast: Not examined  Cardiovascular system; Heart sounds normal,  S1 and  S2 ,no S3.  No murmur, or thrill. Apical beat not displaced Peripheral pulses normal.  Abdomen: Soft, non tender.    GU: Not examined, asymptomatic  Musculoskeletal exam: Decreased  ROM of lumbar spine, normal in hips , shoulders and knees.  deformity ,swelling and  crepitus noted.  muscle wasting noted.   Neurologic: Cranial nerves 2 to 12 intact. Marland Kitchen  disturbance in gait. No tremor.  Skin: Intact, no ulceration, erythema , scaling or rash noted. Pigmentation normal throughout  Psych; Normal mood and affect. Judgement and concentration normal   Assessment & Plan:  Back pain with left-sided radiculopathy Worse in past 2 months, left leg gives out , reduced sensation, uncontrolled pain, needing assistance to get out of bed  Chronic hoarseness Established GERD, worsening symp[roms, refer to ENT, has h/o polyp  Hypertension goal BP (blood pressure) < 130/80 Elevated and uncontrolled , pt has not tken medication today. improtance of comliance is stressed DASH diet and commitment to daily physical activity for a  minimum of 30 minutes discussed and encouraged, as a part of hypertension management. The importance of attaining a healthy weight is also discussed.  BP/Weight 04/13/2019 03/24/2019 01/20/2019 11/18/2018 10/08/2018 08/22/2018 [id number]  Systolic BP 0000000 [id number]  Diastolic BP 94 87 82 82 82 60 82  Wt. (Lbs) 223 220 215 215 216 220.6 218.08  BMI 35.99 35.51 34.7 34.7 34.86 35.61 35.2       Morbid obesity (HCC) Obesity linked with hypertension, arthritis and diabetes  Patient re-educated about  the importance of commitment to a  minimum of 150 minutes of exercise per week as able.  The importance of healthy food choices with portion control discussed, as well as eating regularly and within a 12 hour window most days. The need to choose "clean , green" food 50 to 75% of the time is discussed, as well as to make water the primary drink and set a goal of 64 ounces water daily.    Weight /BMI 04/13/2019 03/24/2019 01/20/2019  WEIGHT 223 lb 220 lb 215 lb  HEIGHT 5\' 6"  5\' 6"  5\' 6"   BMI 35.99 kg/m2 35.51 kg/m2 34.7 kg/m2      Annual physical exam Annual exam as documented. Counseling done  re healthy lifestyle involving commitment to 150 minutes exercise per week, heart healthy diet, and attaining healthy weight.The importance of adequate sleep also discussed. Regular seat belt use and home safety, is also discussed. Changes in health habits are decided on by the patient with goals and time frames  set for  achieving them. Immunization and cancer screening needs are specifically addressed at this visit.   Vaccination not carried out because of patient refusal Again refuses influenza vaccine and pneumonia vaccine

## 2019-04-13 NOTE — Patient Instructions (Addendum)
F/U March 2020 with MD, call if you need me before  PLEASE BP meds every day  Please reconsider all vaccines  Please schedule mammogram and get this done    You are referred to ENT and Neurosurgery as discussed  Fasting lipid , cmp and and EGFR, HBA1C [facility] in January, TSH and vit D and  microalb please  Thanks for choosing [facility], we consider it a privelige to serve you.

## 2019-04-13 NOTE — Assessment & Plan Note (Signed)
Worse in past 2 months, left leg gives out , reduced sensation, uncontrolled pain, needing assistance to get out of bed

## 2019-04-13 NOTE — Assessment & Plan Note (Addendum)
Established GERD, worsening symp[roms, refer to ENT, has h/o polyp

## 2019-04-14 NOTE — Assessment & Plan Note (Signed)

## 2019-04-14 NOTE — Assessment & Plan Note (Signed)
Elevated and uncontrolled , pt has not tken medication today. improtance of comliance is stressed DASH diet and commitment to daily physical activity for a minimum of 30 minutes discussed and encouraged, as a part of hypertension management. The importance of attaining a healthy weight is also discussed.  BP/Weight 04/13/2019 03/24/2019 01/20/2019 11/18/2018 10/08/2018 08/22/2018 [id number]  Systolic BP 0000000 [id number]  Diastolic BP 94 87 82 82 82 60 82  Wt. (Lbs) 223 220 215 215 216 220.6 218.08  BMI 35.99 35.51 34.7 34.7 34.86 35.61 35.2

## 2019-04-14 NOTE — Assessment & Plan Note (Signed)
Again refuses influenza vaccine and pneumonia vaccine

## 2019-04-14 NOTE — Assessment & Plan Note (Signed)
Obesity linked with hypertension, arthritis and diabetes  Patient re-educated about  the importance of commitment to a  minimum of 150 minutes of exercise per week as able.  The importance of healthy food choices with portion control discussed, as well as eating regularly and within a 12 hour window most days. The need to choose "clean , green" food 50 to 75% of the time is discussed, as well as to make water the primary drink and set a goal of 64 ounces water daily.    Weight /BMI 04/13/2019 03/24/2019 01/20/2019  WEIGHT 223 lb 220 lb 215 lb  HEIGHT 5\' 6"  5\' 6"  5\' 6"   BMI 35.99 kg/m2 35.51 kg/m2 34.7 kg/m2

## 2019-04-16 ENCOUNTER — Ambulatory Visit (INDEPENDENT_AMBULATORY_CARE_PROVIDER_SITE_OTHER): Payer: Medicare Other | Admitting: *Deleted

## 2019-04-16 ENCOUNTER — Other Ambulatory Visit: Payer: Self-pay

## 2019-04-16 DIAGNOSIS — I272 Pulmonary hypertension, unspecified: Secondary | ICD-10-CM | POA: Diagnosis not present

## 2019-04-16 LAB — POCT INR: INR: 4 — AB (ref 2.0–3.0)

## 2019-04-16 NOTE — Patient Instructions (Signed)
Description   Do not take any Warfarin then continue taking 2.5mg  daily.  Recheck INR in 3 weeks. Please call [phone] if you have any changes in your medications or up coming procedures.

## 2019-04-22 ENCOUNTER — Other Ambulatory Visit: Payer: Self-pay | Admitting: Neurosurgery

## 2019-04-22 DIAGNOSIS — M545 Low back pain, unspecified: Secondary | ICD-10-CM

## 2019-04-24 DIAGNOSIS — H40032 Anatomical narrow angle, left eye: Secondary | ICD-10-CM | POA: Diagnosis not present

## 2019-05-08 DIAGNOSIS — H40031 Anatomical narrow angle, right eye: Secondary | ICD-10-CM | POA: Diagnosis not present

## 2019-05-14 ENCOUNTER — Other Ambulatory Visit: Payer: Medicare Other

## 2019-05-14 ENCOUNTER — Ambulatory Visit (INDEPENDENT_AMBULATORY_CARE_PROVIDER_SITE_OTHER): Payer: Medicare Other | Admitting: *Deleted

## 2019-05-14 ENCOUNTER — Ambulatory Visit
Admission: RE | Admit: 2019-05-14 | Discharge: 2019-05-14 | Disposition: A | Discharge status: Home / Self Care | Payer: Medicare Other | Source: Ambulatory Visit | Attending: Neurosurgery | Admitting: Neurosurgery

## 2019-05-14 ENCOUNTER — Other Ambulatory Visit: Payer: Self-pay

## 2019-05-14 DIAGNOSIS — M545 Low back pain, unspecified: Secondary | ICD-10-CM

## 2019-05-14 DIAGNOSIS — I272 Pulmonary hypertension, unspecified: Secondary | ICD-10-CM

## 2019-05-14 DIAGNOSIS — M48061 Spinal stenosis, lumbar region without neurogenic claudication: Secondary | ICD-10-CM | POA: Diagnosis not present

## 2019-05-14 DIAGNOSIS — M50222 Other cervical disc displacement at C5-C6 level: Secondary | ICD-10-CM | POA: Diagnosis not present

## 2019-05-14 DIAGNOSIS — Z5181 Encounter for therapeutic drug level monitoring: Secondary | ICD-10-CM | POA: Diagnosis not present

## 2019-05-14 DIAGNOSIS — M4802 Spinal stenosis, cervical region: Secondary | ICD-10-CM | POA: Diagnosis not present

## 2019-05-14 DIAGNOSIS — D3611 Benign neoplasm of peripheral nerves and autonomic nervous system of face, head, and neck: Secondary | ICD-10-CM

## 2019-05-14 DIAGNOSIS — M47812 Spondylosis without myelopathy or radiculopathy, cervical region: Secondary | ICD-10-CM | POA: Diagnosis not present

## 2019-05-14 LAB — POCT INR: INR: 2.3 (ref 2.0–3.0)

## 2019-05-14 MED ORDER — GADOBENATE DIMEGLUMINE 529 MG/ML IV SOLN
20.0000 mL | Freq: Once | INTRAVENOUS | Status: AC | PRN
  Administered 2019-05-14: 20 mL via INTRAVENOUS

## 2019-05-14 NOTE — Patient Instructions (Signed)
Description    Continue taking 2.5mg  daily.  Recheck INR in 4 weeks. Please call [phone] if you have any changes in your medications or up coming procedures.

## 2019-05-18 DIAGNOSIS — I10 Essential (primary) hypertension: Secondary | ICD-10-CM | POA: Diagnosis not present

## 2019-05-18 DIAGNOSIS — D3611 Benign neoplasm of peripheral nerves and autonomic nervous system of face, head, and neck: Secondary | ICD-10-CM | POA: Diagnosis not present

## 2019-05-19 ENCOUNTER — Telehealth: Payer: Self-pay | Admitting: *Deleted

## 2019-05-19 NOTE — Telephone Encounter (Signed)
Pt called needing to know how many units of vitamin D she is supposed to be taking would like a call back

## 2019-05-19 NOTE — Telephone Encounter (Signed)
Spoke with patient and advised her to take 2000 units of Vitamin D once daily

## 2019-06-11 ENCOUNTER — Ambulatory Visit (INDEPENDENT_AMBULATORY_CARE_PROVIDER_SITE_OTHER): Payer: Medicare Other | Admitting: *Deleted

## 2019-06-11 ENCOUNTER — Other Ambulatory Visit: Payer: Self-pay

## 2019-06-11 DIAGNOSIS — I272 Pulmonary hypertension, unspecified: Secondary | ICD-10-CM

## 2019-06-11 DIAGNOSIS — Z7901 Long term (current) use of anticoagulants: Secondary | ICD-10-CM | POA: Diagnosis not present

## 2019-06-11 LAB — POCT INR: INR: 2.6 (ref 2.0–3.0)

## 2019-06-11 NOTE — Patient Instructions (Signed)
Description    Continue taking 2.5mg  daily.  Recheck INR in 5 weeks. Please call [phone] if you have any changes in your medications or up coming procedures.

## 2019-06-14 ENCOUNTER — Other Ambulatory Visit: Payer: Self-pay | Admitting: Cardiology

## 2019-06-15 ENCOUNTER — Encounter: Payer: Self-pay | Admitting: Family Medicine

## 2019-06-15 DIAGNOSIS — H2513 Age-related nuclear cataract, bilateral: Secondary | ICD-10-CM | POA: Diagnosis not present

## 2019-06-15 DIAGNOSIS — H35372 Puckering of macula, left eye: Secondary | ICD-10-CM | POA: Diagnosis not present

## 2019-06-15 DIAGNOSIS — E11319 Type 2 diabetes mellitus with unspecified diabetic retinopathy without macular edema: Secondary | ICD-10-CM | POA: Diagnosis not present

## 2019-06-15 LAB — HM DIABETES EYE EXAM

## 2019-07-16 ENCOUNTER — Ambulatory Visit (INDEPENDENT_AMBULATORY_CARE_PROVIDER_SITE_OTHER): Payer: Medicare Other | Admitting: *Deleted

## 2019-07-16 ENCOUNTER — Other Ambulatory Visit: Payer: Self-pay

## 2019-07-16 DIAGNOSIS — I272 Pulmonary hypertension, unspecified: Secondary | ICD-10-CM

## 2019-07-16 DIAGNOSIS — Z5181 Encounter for therapeutic drug level monitoring: Secondary | ICD-10-CM | POA: Diagnosis not present

## 2019-07-16 LAB — POCT INR: INR: 2.6 (ref 2.0–3.0)

## 2019-07-16 NOTE — Patient Instructions (Signed)
Description    Continue taking 2.5mg  daily.  Recheck INR in 6 weeks. Please call [phone] if you have any changes in your medications or up coming procedures.

## 2019-07-27 ENCOUNTER — Other Ambulatory Visit: Payer: Self-pay

## 2019-07-27 ENCOUNTER — Telehealth: Payer: Self-pay | Admitting: *Deleted

## 2019-07-27 MED ORDER — GLIPIZIDE ER 2.5 MG PO TB24: 2.5000 mg | ORAL_TABLET | Freq: Every day | ORAL | 0 refills | Status: DC

## 2019-07-27 NOTE — Telephone Encounter (Signed)
Medication refilled and sent to pharmacy.

## 2019-07-27 NOTE — Telephone Encounter (Signed)
Pt is out of her glipizide and she needs this sent to [facility] she has none to take

## 2019-08-13 ENCOUNTER — Other Ambulatory Visit: Payer: Self-pay | Admitting: Family Medicine

## 2019-08-27 ENCOUNTER — Other Ambulatory Visit: Payer: Self-pay

## 2019-08-27 ENCOUNTER — Ambulatory Visit (INDEPENDENT_AMBULATORY_CARE_PROVIDER_SITE_OTHER): Payer: Medicare Other | Admitting: *Deleted

## 2019-08-27 DIAGNOSIS — Z5181 Encounter for therapeutic drug level monitoring: Secondary | ICD-10-CM | POA: Diagnosis not present

## 2019-08-27 LAB — POCT INR: INR: 2 (ref 2.0–3.0)

## 2019-08-27 NOTE — Patient Instructions (Signed)
Description   Continue taking 2.5mg  daily.  Recheck INR in 6 weeks. Please call [phone] if you have any changes in your medications or up coming procedures.

## 2019-09-02 ENCOUNTER — Ambulatory Visit: Payer: Medicare Other | Admitting: Family Medicine

## 2019-09-02 ENCOUNTER — Other Ambulatory Visit: Payer: Self-pay

## 2019-09-02 ENCOUNTER — Encounter: Payer: Self-pay | Admitting: Family Medicine

## 2019-09-02 ENCOUNTER — Ambulatory Visit (INDEPENDENT_AMBULATORY_CARE_PROVIDER_SITE_OTHER): Payer: Medicare Other | Admitting: Family Medicine

## 2019-09-02 VITALS — BP 160/94 | Ht 66.0 in | Wt 223.0 lb

## 2019-09-02 DIAGNOSIS — E1121 Type 2 diabetes mellitus with diabetic nephropathy: Secondary | ICD-10-CM | POA: Diagnosis not present

## 2019-09-02 DIAGNOSIS — Z6835 Body mass index (BMI) 35.0-35.9, adult: Secondary | ICD-10-CM

## 2019-09-02 DIAGNOSIS — I10 Essential (primary) hypertension: Secondary | ICD-10-CM | POA: Diagnosis not present

## 2019-09-02 DIAGNOSIS — R143 Flatulence: Secondary | ICD-10-CM

## 2019-09-02 MED ORDER — SIMETHICONE 125 MG PO CAPS: ORAL_CAPSULE | ORAL | 1 refills | Status: DC

## 2019-09-02 NOTE — Progress Notes (Signed)
Virtual Visit via Telephone Note  I connected with Stephanie Singleton on 09/02/19 at  3:40 PM EST by telephone and verified that I am speaking with the correct person using two identifiers.  Location: Patient: home Provider: office   I discussed the limitations, risks, security and privacy concerns of performing an evaluation and management service by telephone and the availability of in person appointments. I also discussed with the patient that there may be a patient responsible charge related to this service. The patient expressed understanding and agreed to proceed.   History of Present Illness: F/U chronic problems, medication review, and refill medication when necessary. Review most recent labs and order labs which are due Review preventive health and update with necessary referrals or immunizations as indicated. Refusing covid vaccine, and all vaccines, will continue to educate C/o constipation, is concerned that glipizide is contributing, I have advised her to d/c same, test sugar and monitor symptom. I do nOT anticipate hyperglycemia with this Denies recent fever or chills. Denies sinus pressure, nasal congestion, ear pain or sore throat. Denies chest congestion, productive cough or wheezing. Denies chest pains, palpitations and leg swelling C/o chronic  abdominal pain, denies nationusea, vomiting,diarrhea .  C/o excess gas and constipa Denies dysuria, frequency, hesitancy or incontinence. C/o chronic joint pain, swelling and limitation in mobility. Denies headaches, seizures, numbness, or tingling. Denies depression, anxiety or insomnia. Denies skin break down or rash.       Observations/Objective: BP (!) 160/94   Ht 5\' 6"  (1.676 m)   Wt 223 lb (101.2 kg)   BMI 35.99 kg/m  Good communication with no confusion and intact memory. Alert and oriented x 3 No signs of respiratory distress during speech    Assessment and Plan:  Hypertension goal BP (blood pressure) <  130/80 Uncontrolled from review, needs inoffice assessment DASH diet and commitment to daily physical activity for a minimum of 30 minutes discussed and encouraged, as a part of hypertension management. The importance of attaining a healthy weight is also discussed.  BP/Weight 09/02/2019 04/13/2019 03/24/2019 01/20/2019 11/18/2018 10/08/2018 [id number]  Systolic BP 0000000 0000000 [id number]  Diastolic BP 94 94 87 82 82 82 60  Wt. (Lbs) 223 223 220 215 215 216 220.6  BMI 35.99 35.99 35.51 34.7 34.7 34.86 35.61       Morbid obesity (HCC) Obesity linked with hypertension and diabetes  Patient re-educated about  the importance of commitment to a  minimum of 150 minutes of exercise per week as able.  The importance of healthy food choices with portion control discussed, as well as eating regularly and within a 12 hour window most days. The need to choose "clean , green" food 50 to 75% of the time is discussed, as well as to make water the primary drink and set a goal of 64 ounces water daily.    Weight /BMI 09/02/2019 04/13/2019 03/24/2019  WEIGHT 223 lb 223 lb 220 lb  HEIGHT 5\' 6"  5\' 6"  5\' 6"   BMI 35.99 kg/m2 35.99 kg/m2 35.51 kg/m2      Type 2 diabetes with nephropathy ([place]) Ms. Lona is reminded of the importance of commitment to daily physical activity for 30 minutes or more, as able and the need to limit carbohydrate intake to 30 to 60 grams per meal to help with blood sugar control.   The need to take medication as prescribed, test blood sugar as directed, and to call between visits if there is a concern that blood sugar  is uncontrolled is also discussed.   Ms. Sides is reminded of the importance of daily foot exam, annual eye examination, and good blood sugar, blood pressure and cholesterol control. Controlled when last check, needs updated lab Denies polyuria, polydipsia, blurred vision , or hypoglycemic episodes. Not testing daily  Diabetic Labs Latest Ref Rng & Units  01/22/2019 07/15/2018 04/01/2018 10/25/2017 04/17/2017  HbA1c 4.8 - 5.6 % 7.0(H) 7.0(H) 7.0(H) 7.2(H) 6.7(H)  Microalbumin Not estab mg/dL - - - - -  Micro/Creat Ratio <30 mcg/mg creat - - - - -  Chol 100 - 199 mg/dL 159 160 197 - 150  HDL >39 mg/dL 42 45 35(L) - 41  Calc LDL 0 - 99 mg/dL 86 86 122(H) - 85  Triglycerides 0 - 149 mg/dL 154(H) 144 198(H) - 118  Creatinine 0.57 - 1.00 mg/dL 1.14(H) 0.93 1.01(H) 0.97 1.07(H)   BP/Weight 09/02/2019 04/13/2019 03/24/2019 01/20/2019 11/18/2018 10/08/2018 [id number]  Systolic BP 0000000 0000000 [id number]  Diastolic BP 94 94 87 82 82 82 60  Wt. (Lbs) 223 223 220 215 215 216 220.6  BMI 35.99 35.99 35.51 34.7 34.7 34.86 35.61   Foot/eye exam completion dates Latest Ref Rng & Units 04/13/2019 10/23/2017  Eye Exam No Retinopathy - -  Foot Form Completion - Done Done        Excessive gas C/o excessgas, trial of simethicone, and needs to continue gI f./u as her main concern is abdominalpai   Follow Up Instructions:    I discussed the assessment and treatment plan with the patient. The patient was provided an opportunity to ask questions and all were answered. The patient agreed with the plan and demonstrated an understanding of the instructions.   The patient was advised to call back or seek an in-person evaluation if the symptoms worsen or if the condition fails to improve as anticipated.  I provided 20 minutes of non-face-to-face time during this encounter.   Tula Nakayama, MD

## 2019-09-02 NOTE — Patient Instructions (Addendum)
F/U in  5 months , call if you need me before  Please get labs ordered in hte next 1 to 4 weeks , needed especially to see how blood sugar is doing  Try stopping glipizide based on concern of gas. Check sugar daily, if numbers are too high resume glipizide. I have also prescribed simethicone to help with the gas   Please schedule and get your mammogram  Please continue to follow up with your GI Doctor  Re ongoing abdominal pain  Thanks for choosing [facility], we consider it a privelige to serve you.

## 2019-09-05 ENCOUNTER — Encounter: Payer: Self-pay | Admitting: Family Medicine

## 2019-09-05 DIAGNOSIS — R143 Flatulence: Secondary | ICD-10-CM | POA: Insufficient documentation

## 2019-09-05 NOTE — Assessment & Plan Note (Signed)
C/o excessgas, trial of simethicone, and needs to continue gI f./u as her main concern is abdominalpai

## 2019-09-05 NOTE — Assessment & Plan Note (Signed)
Uncontrolled from review, needs inoffice assessment DASH diet and commitment to daily physical activity for a minimum of 30 minutes discussed and encouraged, as a part of hypertension management. The importance of attaining a healthy weight is also discussed.  BP/Weight 09/02/2019 04/13/2019 03/24/2019 01/20/2019 11/18/2018 10/08/2018 [id number]  Systolic BP 0000000 0000000 [id number]  Diastolic BP 94 94 87 82 82 82 60  Wt. (Lbs) 223 223 220 215 215 216 220.6  BMI 35.99 35.99 35.51 34.7 34.7 34.86 35.61

## 2019-09-05 NOTE — Assessment & Plan Note (Signed)
Stephanie Singleton is reminded of the importance of commitment to daily physical activity for 30 minutes or more, as able and the need to limit carbohydrate intake to 30 to 60 grams per meal to help with blood sugar control.   The need to take medication as prescribed, test blood sugar as directed, and to call between visits if there is a concern that blood sugar is uncontrolled is also discussed.   Stephanie Singleton is reminded of the importance of daily foot exam, annual eye examination, and good blood sugar, blood pressure and cholesterol control. Controlled when last check, needs updated lab Denies polyuria, polydipsia, blurred vision , or hypoglycemic episodes. Not testing daily  Diabetic Labs Latest Ref Rng & Units 01/22/2019 07/15/2018 04/01/2018 10/25/2017 04/17/2017  HbA1c 4.8 - 5.6 % 7.0(H) 7.0(H) 7.0(H) 7.2(H) 6.7(H)  Microalbumin Not estab mg/dL - - - - -  Micro/Creat Ratio <30 mcg/mg creat - - - - -  Chol 100 - 199 mg/dL 159 160 197 - 150  HDL >39 mg/dL 42 45 35(L) - 41  Calc LDL 0 - 99 mg/dL 86 86 122(H) - 85  Triglycerides 0 - 149 mg/dL 154(H) 144 198(H) - 118  Creatinine 0.57 - 1.00 mg/dL 1.14(H) 0.93 1.01(H) 0.97 1.07(H)   BP/Weight 09/02/2019 04/13/2019 03/24/2019 01/20/2019 11/18/2018 10/08/2018 [id number]  Systolic BP 0000000 0000000 [id number]  Diastolic BP 94 94 87 82 82 82 60  Wt. (Lbs) 223 223 220 215 215 216 220.6  BMI 35.99 35.99 35.51 34.7 34.7 34.86 35.61   Foot/eye exam completion dates Latest Ref Rng & Units 04/13/2019 10/23/2017  Eye Exam No Retinopathy - -  Foot Form Completion - Done Done

## 2019-09-05 NOTE — Assessment & Plan Note (Signed)
Obesity linked with hypertension and diabetes  Patient re-educated about  the importance of commitment to a  minimum of 150 minutes of exercise per week as able.  The importance of healthy food choices with portion control discussed, as well as eating regularly and within a 12 hour window most days. The need to choose "clean , green" food 50 to 75% of the time is discussed, as well as to make water the primary drink and set a goal of 64 ounces water daily.    Weight /BMI 09/02/2019 04/13/2019 03/24/2019  WEIGHT 223 lb 223 lb 220 lb  HEIGHT 5\' 6"  5\' 6"  5\' 6"   BMI 35.99 kg/m2 35.99 kg/m2 35.51 kg/m2

## 2019-09-06 ENCOUNTER — Other Ambulatory Visit: Payer: Self-pay | Admitting: Cardiology

## 2019-09-08 ENCOUNTER — Ambulatory Visit: Payer: Medicare Other | Admitting: Family Medicine

## 2019-09-26 ENCOUNTER — Other Ambulatory Visit: Payer: Self-pay | Admitting: Family Medicine

## 2019-09-28 ENCOUNTER — Other Ambulatory Visit: Payer: Self-pay | Admitting: Family Medicine

## 2019-10-08 ENCOUNTER — Ambulatory Visit (INDEPENDENT_AMBULATORY_CARE_PROVIDER_SITE_OTHER): Payer: Medicare Other | Admitting: *Deleted

## 2019-10-08 ENCOUNTER — Other Ambulatory Visit: Payer: Medicare Other | Admitting: *Deleted

## 2019-10-08 ENCOUNTER — Other Ambulatory Visit: Payer: Self-pay

## 2019-10-08 DIAGNOSIS — E1121 Type 2 diabetes mellitus with diabetic nephropathy: Secondary | ICD-10-CM | POA: Diagnosis not present

## 2019-10-08 DIAGNOSIS — Z5181 Encounter for therapeutic drug level monitoring: Secondary | ICD-10-CM | POA: Diagnosis not present

## 2019-10-08 LAB — POCT INR: INR: 2.2 (ref 2.0–3.0)

## 2019-10-08 NOTE — Patient Instructions (Signed)
Description   Continue taking 2.5mg  daily.  Recheck INR in 7 weeks. Please call [phone] if you have any changes in your medications or up coming procedures.

## 2019-10-09 LAB — CMP14+EGFR
ALT: 12 IU/L (ref 0–32)
AST: 15 IU/L (ref 0–40)
Albumin/Globulin Ratio: 1.2 (ref 1.2–2.2)
Albumin: 4.2 g/dL (ref 3.7–4.7)
Alkaline Phosphatase: 115 IU/L (ref 39–117)
BUN/Creatinine Ratio: 16 (ref 12–28)
BUN: 17 mg/dL (ref 8–27)
Bilirubin Total: 0.5 mg/dL (ref 0.0–1.2)
CO2: 23 mmol/L (ref 20–29)
Calcium: 9.6 mg/dL (ref 8.7–10.3)
Chloride: 96 mmol/L (ref 96–106)
Creatinine, Ser: 1.05 mg/dL — ABNORMAL HIGH (ref 0.57–1.00)
GFR calc Af Amer: 62 mL/min/{1.73_m2} (ref >59)
GFR calc non Af Amer: 54 mL/min/{1.73_m2} — ABNORMAL LOW (ref >59)
Globulin, Total: 3.4 g/dL (ref 1.5–4.5)
Glucose: 176 mg/dL — ABNORMAL HIGH (ref 65–99)
Potassium: 4.1 mmol/L (ref 3.5–5.2)
Sodium: 137 mmol/L (ref 134–144)
Total Protein: 7.6 g/dL (ref 6.0–8.5)

## 2019-10-09 LAB — HEMOGLOBIN A1C
Est. average glucose Bld gHb Est-mCnc: 169 mg/dL
Hgb A1c MFr Bld: 7.5 % — ABNORMAL HIGH (ref 4.8–5.6)

## 2019-10-09 LAB — LIPID PANEL WITH LDL/HDL RATIO
Cholesterol, Total: 160 mg/dL (ref 100–199)
HDL: 43 mg/dL (ref >39)
LDL Chol Calc (NIH): 93 mg/dL (ref 0–99)
LDL/HDL Ratio: 2.2 ratio (ref 0.0–3.2)
Triglycerides: 134 mg/dL (ref 0–149)
VLDL Cholesterol Cal: 24 mg/dL (ref 5–40)

## 2019-10-12 ENCOUNTER — Other Ambulatory Visit: Payer: Self-pay | Admitting: Family Medicine

## 2019-10-12 DIAGNOSIS — E785 Hyperlipidemia, unspecified: Secondary | ICD-10-CM

## 2019-10-12 DIAGNOSIS — I119 Hypertensive heart disease without heart failure: Secondary | ICD-10-CM

## 2019-10-12 DIAGNOSIS — Z5181 Encounter for therapeutic drug level monitoring: Secondary | ICD-10-CM

## 2019-10-12 DIAGNOSIS — I739 Peripheral vascular disease, unspecified: Secondary | ICD-10-CM

## 2019-10-12 DIAGNOSIS — Z86711 Personal history of pulmonary embolism: Secondary | ICD-10-CM

## 2019-10-15 DIAGNOSIS — E1121 Type 2 diabetes mellitus with diabetic nephropathy: Secondary | ICD-10-CM

## 2019-10-15 DIAGNOSIS — I10 Essential (primary) hypertension: Secondary | ICD-10-CM

## 2019-10-20 ENCOUNTER — Other Ambulatory Visit: Payer: Self-pay | Admitting: Family Medicine

## 2019-11-12 ENCOUNTER — Other Ambulatory Visit: Payer: Self-pay | Admitting: Family Medicine

## 2019-11-26 ENCOUNTER — Ambulatory Visit (INDEPENDENT_AMBULATORY_CARE_PROVIDER_SITE_OTHER): Payer: Medicare Other

## 2019-11-26 ENCOUNTER — Other Ambulatory Visit: Payer: Self-pay

## 2019-11-26 DIAGNOSIS — Z5181 Encounter for therapeutic drug level monitoring: Secondary | ICD-10-CM

## 2019-11-26 DIAGNOSIS — I272 Pulmonary hypertension, unspecified: Secondary | ICD-10-CM | POA: Diagnosis not present

## 2019-11-26 LAB — POCT INR: INR: 1.7 — AB (ref 2.0–3.0)

## 2019-11-26 NOTE — Patient Instructions (Signed)
Description   Take 1.5 tablets today (3.75mg ), then resume same dosage 1 tablet (2.5mg ) daily.  Recheck INR in 4 weeks. Please call [phone] if you have any changes in your medications or up coming procedures.

## 2019-11-29 ENCOUNTER — Other Ambulatory Visit: Payer: Self-pay | Admitting: Cardiology

## 2019-12-21 ENCOUNTER — Other Ambulatory Visit: Payer: Self-pay | Admitting: Family Medicine

## 2019-12-21 ENCOUNTER — Other Ambulatory Visit: Payer: Self-pay | Admitting: Cardiology

## 2019-12-21 DIAGNOSIS — I739 Peripheral vascular disease, unspecified: Secondary | ICD-10-CM

## 2019-12-21 DIAGNOSIS — E785 Hyperlipidemia, unspecified: Secondary | ICD-10-CM

## 2019-12-21 DIAGNOSIS — I119 Hypertensive heart disease without heart failure: Secondary | ICD-10-CM

## 2019-12-21 DIAGNOSIS — Z5181 Encounter for therapeutic drug level monitoring: Secondary | ICD-10-CM

## 2019-12-21 DIAGNOSIS — Z86711 Personal history of pulmonary embolism: Secondary | ICD-10-CM

## 2019-12-24 ENCOUNTER — Other Ambulatory Visit: Payer: Self-pay

## 2019-12-24 ENCOUNTER — Ambulatory Visit (INDEPENDENT_AMBULATORY_CARE_PROVIDER_SITE_OTHER): Payer: Medicare Other | Admitting: *Deleted

## 2019-12-24 DIAGNOSIS — Z5181 Encounter for therapeutic drug level monitoring: Secondary | ICD-10-CM | POA: Diagnosis not present

## 2019-12-24 LAB — POCT INR: INR: 2 (ref 2.0–3.0)

## 2019-12-24 NOTE — Patient Instructions (Addendum)
Description   Take 1.5 tablets today (3.75mg ), then resume same dosage 1 tablet (2.5mg ) daily.  Recheck INR in 5 weeks. Please call [phone] if you have any changes in your medications or up coming procedures.

## 2020-01-21 ENCOUNTER — Other Ambulatory Visit: Payer: Self-pay

## 2020-01-21 ENCOUNTER — Encounter: Payer: Self-pay | Admitting: Family Medicine

## 2020-01-21 ENCOUNTER — Telehealth (INDEPENDENT_AMBULATORY_CARE_PROVIDER_SITE_OTHER): Payer: Medicare Other | Admitting: Family Medicine

## 2020-01-21 VITALS — BP 160/94 | Ht 66.0 in | Wt 223.0 lb

## 2020-01-21 DIAGNOSIS — Z0000 Encounter for general adult medical examination without abnormal findings: Secondary | ICD-10-CM | POA: Diagnosis not present

## 2020-01-21 NOTE — Patient Instructions (Signed)
Stephanie Singleton , Thank you for taking time to come for your Medicare Wellness Visit. I appreciate your ongoing commitment to your health goals. Please review the following plan we discussed and let me know if I can assist you in the future.   Screening recommendations/referrals: Colonoscopy: up to date Mammogram: you reported you will schedule this Bone Density: declined repeat  Recommended yearly ophthalmology/optometry visit for glaucoma screening and checkup Recommended yearly dental visit for hygiene and checkup  Vaccinations: Influenza vaccine: up to date Pneumococcal vaccine: up to date Tdap vaccine: up to date Shingles vaccine: declined COVID-19: declined  Advanced directives: Let us know if you need assistance with this .  Conditions/risks identified: Falls  Next appointment: 02/02/2020 with Dr Moshe Cipro as scheduled; 1 yr for AWV     Preventive Care 72 Years and Older, Female Preventive care refers to lifestyle choices and visits with your health care provider that can promote health and wellness. What does preventive care include?  A yearly physical exam. This is also called an annual well check.  Dental exams once or twice a year.  Routine eye exams. Ask your health care provider how often you should have your eyes checked.  Personal lifestyle choices, including:  Daily care of your teeth and gums.  Regular physical activity.  Eating a healthy diet.  Avoiding tobacco and drug use.  Limiting alcohol use.  Practicing safe sex.  Taking low-dose aspirin every day.  Taking vitamin and mineral supplements as recommended by your health care provider. What happens during an annual well check? The services and screenings done by your health care provider during your annual well check will depend on your age, overall health, lifestyle risk factors, and family history of disease. Counseling  Your health care provider may ask you questions about your:  Alcohol  use.  Tobacco use.  Drug use.  Emotional well-being.  Home and relationship well-being.  Sexual activity.  Eating habits.  History of falls.  Memory and ability to understand (cognition).  Work and work Statistician.  Reproductive health. Screening  You may have the following tests or measurements:  Height, weight, and BMI.  Blood pressure.  Lipid and cholesterol levels. These may be checked every 5 years, or more frequently if you are over 31 years old.  Skin check.  Lung cancer screening. You may have this screening every year starting at age 63 if you have a 30-pack-year history of smoking and currently smoke or have quit within the past 15 years.  Fecal occult blood test (FOBT) of the stool. You may have this test every year starting at age 48.  Flexible sigmoidoscopy or colonoscopy. You may have a sigmoidoscopy every 5 years or a colonoscopy every 10 years starting at age 27.  Hepatitis C blood test.  Hepatitis B blood test.  Sexually transmitted disease (STD) testing.  Diabetes screening. This is done by checking your blood sugar (glucose) after you have not eaten for a while (fasting). You may have this done every 1-3 years.  Bone density scan. This is done to screen for osteoporosis. You may have this done starting at age 10.  Mammogram. This may be done every 1-2 years. Talk to your health care provider about how often you should have regular mammograms. Talk with your health care provider about your test results, treatment options, and if necessary, the need for more tests. Vaccines  Your health care provider may recommend certain vaccines, such as:  Influenza vaccine. This is recommended every year.  Tetanus, diphtheria, and acellular pertussis (Tdap, Td) vaccine. You may need a Td booster every 10 years.  Zoster vaccine. You may need this after age 11.  Pneumococcal 13-valent conjugate (PCV13) vaccine. One dose is recommended after age  6.  Pneumococcal polysaccharide (PPSV23) vaccine. One dose is recommended after age 69. Talk to your health care provider about which screenings and vaccines you need and how often you need them. This information is not intended to replace advice given to you by your health care provider. Make sure you discuss any questions you have with your health care provider. Document Released: 07/15/2015 Document Revised: 03/07/2016 Document Reviewed: 04/19/2015 Elsevier Interactive Patient Education  2017 [place] Prevention in the Home Falls can cause injuries. They can happen to people of all ages. There are many things you can do to make your home safe and to help prevent falls. What can I do on the outside of my home?  Regularly fix the edges of walkways and driveways and fix any cracks.  Remove anything that might make you trip as you walk through a door, such as a raised step or threshold.  Trim any bushes or trees on the path to your home.  Use bright outdoor lighting.  Clear any walking paths of anything that might make someone trip, such as rocks or tools.  Regularly check to see if handrails are loose or broken. Make sure that both sides of any steps have handrails.  Any raised decks and porches should have guardrails on the edges.  Have any leaves, snow, or ice cleared regularly.  Use sand or salt on walking paths during winter.  Clean up any spills in your garage right away. This includes oil or grease spills. What can I do in the bathroom?  Use night lights.  Install grab bars by the toilet and in the tub and shower. Do not use towel bars as grab bars.  Use non-skid mats or decals in the tub or shower.  If you need to sit down in the shower, use a plastic, non-slip stool.  Keep the floor dry. Clean up any water that spills on the floor as soon as it happens.  Remove soap buildup in the tub or shower regularly.  Attach bath mats securely with double-sided  non-slip rug tape.  Do not have throw rugs and other things on the floor that can make you trip. What can I do in the bedroom?  Use night lights.  Make sure that you have a light by your bed that is easy to reach.  Do not use any sheets or blankets that are too big for your bed. They should not hang down onto the floor.  Have a firm chair that has side arms. You can use this for support while you get dressed.  Do not have throw rugs and other things on the floor that can make you trip. What can I do in the kitchen?  Clean up any spills right away.  Avoid walking on wet floors.  Keep items that you use a lot in easy-to-reach places.  If you need to reach something above you, use a strong step stool that has a grab bar.  Keep electrical cords out of the way.  Do not use floor polish or wax that makes floors slippery. If you must use wax, use non-skid floor wax.  Do not have throw rugs and other things on the floor that can make you trip. What can I do  with my stairs?  Do not leave any items on the stairs.  Make sure that there are handrails on both sides of the stairs and use them. Fix handrails that are broken or loose. Make sure that handrails are as long as the stairways.  Check any carpeting to make sure that it is firmly attached to the stairs. Fix any carpet that is loose or worn.  Avoid having throw rugs at the top or bottom of the stairs. If you do have throw rugs, attach them to the floor with carpet tape.  Make sure that you have a light switch at the top of the stairs and the bottom of the stairs. If you do not have them, ask someone to add them for you. What else can I do to help prevent falls?  Wear shoes that:  Do not have high heels.  Have rubber bottoms.  Are comfortable and fit you well.  Are closed at the toe. Do not wear sandals.  If you use a stepladder:  Make sure that it is fully opened. Do not climb a closed stepladder.  Make sure that both  sides of the stepladder are locked into place.  Ask someone to hold it for you, if possible.  Clearly mark and make sure that you can see:  Any grab bars or handrails.  First and last steps.  Where the edge of each step is.  Use tools that help you move around (mobility aids) if they are needed. These include:  Canes.  Walkers.  Scooters.  Crutches.  Turn on the lights when you go into a dark area. Replace any light bulbs as soon as they burn out.  Set up your furniture so you have a clear path. Avoid moving your furniture around.  If any of your floors are uneven, fix them.  If there are any pets around you, be aware of where they are.  Review your medicines with your doctor. Some medicines can make you feel dizzy. This can increase your chance of falling. Ask your doctor what other things that you can do to help prevent falls. This information is not intended to replace advice given to you by your health care provider. Make sure you discuss any questions you have with your health care provider. Document Released: 04/14/2009 Document Revised: 11/24/2015 Document Reviewed: 07/23/2014 Elsevier Interactive Patient Education  2017 [employer].

## 2020-01-21 NOTE — Progress Notes (Signed)
Subjective:   Stephanie Singleton is a 74 y.o. female who presents for Medicare Annual (Subsequent) preventive examination.  Location of Patient: Home Location of Provider: Office  Consent was obtain for visit to be over via telephone I verified that I am speaking with the correct person using two identifiers.   Review of Systems    yes       Objective:    There were no vitals filed for this visit. There is no height or weight on file to calculate BMI.  Advanced Directives 01/15/2018 08/07/2016 10/31/2014 09/07/2014 05/01/2011  Does Patient Have a Medical Advance Directive? No No No No Patient does not have advance directive;Patient would not like information  Would patient like information on creating a medical advance directive? No - Patient declined No - Patient declined No - patient declined information Yes - Educational materials given -    Current Medications (verified) Outpatient Encounter Medications as of 01/21/2020  Medication Sig  . ACCU-CHEK AVIVA PLUS test strip USE TO TEST ONCE DAILY  . Accu-Chek FastClix Lancets MISC 1 Units by Does not apply route 2 (two) times a day.  . cetirizine (ZYRTEC) 10 MG tablet Take 1 tablet (10 mg total) by mouth daily.  Marland Kitchen glipiZIDE (GLUCOTROL XL) 2.5 MG 24 hr tablet TAKE 1 TABLET (2.5 MG TOTAL) BY MOUTH DAILY WITH BREAKFAST.  . hydrochlorothiazide (HYDRODIURIL) 25 MG tablet TAKE 1 TABLET BY MOUTH EVERY DAY  . hydrOXYzine (ATARAX/VISTARIL) 50 MG tablet TAKE 1 TABLET BY MOUTH EVERYDAY AT BEDTIME  . KLOR-CON M20 20 MEQ tablet TAKE 1 TABLET TWICE A WEEK WHEN YOU TAKE DEMADEX (TORSEMIDE)  . olmesartan (BENICAR) 40 MG tablet TAKE 1 TABLET BY MOUTH EVERY DAY  . pantoprazole (PROTONIX) 40 MG tablet TAKE 1 TABLET BY MOUTH EVERY DAY  . pravastatin (PRAVACHOL) 20 MG tablet TAKE 1 TABLET BY MOUTH EVERY DAY  . Simethicone 125 MG CAPS One capsule by mouth , three times daily as needed, for excess gas  . tiZANidine (ZANAFLEX) 4 MG tablet TAKE ONE TABLET  TWO TIMES DAILY FOR 1 WEEK, THEN AT BEDTIME AS NEEDED  . torsemide (DEMADEX) 20 MG tablet TAKE 1 TABLET 2 TIMES A WEEK AS NEEDED FOR SWELLING  . TRADJENTA 5 MG TABS tablet TAKE 1 TABLET BY MOUTH EVERY DAY  . warfarin (COUMADIN) 2.5 MG tablet TAKE 1 TABLET DAILY OR AS DIRECTED BY COUMADIN CLINIC   No facility-administered encounter medications on file as of 01/21/2020.    Allergies (verified) Aspirin and Lisinopril   History: Past Medical History:  Diagnosis Date  . Chronic diastolic CHF (congestive heart failure) ([place])   . CKD (chronic kidney disease), stage III   . Diabetes mellitus DX X 1 YR   USE METFORMIN ACCORDING TO CBG RESULT  . Dilatation of aorta (HCC)   . Embolism - blood clot 2004   Pulmonary .on coumadin  . GERD (gastroesophageal reflux disease)    ON PREVACID  . HTN (hypertension)    EKG 10/23 IN EPIC  . Hyperlipidemia   . Hypertension    CARDIOLOGY VISIT.CARDIAC CLEARANCE.LOVENOX INSTRUCTIONS.  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 OSA (obstructive sleep apnea) DX 8 YRS AGO.DOES NOT WEAR MASK  . Pulmonary embolism ([place])    a. 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  . Tumor of soft tissue of neck    Past Surgical History:  Procedure Laterality Date  . CERVICAL  FUSION     Family History  Problem Relation Age of Onset  . Heart disease Mother   . Hypertension Mother   . Stroke Mother   . Diabetes Father   . Peripheral vascular disease Father   . Peripheral vascular disease Brother   . Heart disease Brother   . Bladder Cancer Sister 76  . Diabetes Daughter        3 of the 5 daughters  . Diabetes Son        3 of the sons  . Breast cancer Sister 57  . Liver disease Son   . Colon cancer Neg Hx   . Pancreatic cancer Neg Hx   . Stomach cancer Neg Hx   . Esophageal cancer Neg Hx    Social History   Socioeconomic History  . Marital status: Divorced    Spouse name: Not on file  . Number  of children: 81  . Years of education: Not on file  . Highest education level: Some college, no degree  Occupational History    Employer: DISABLED     Comment: [employer]  Tobacco Use  . Smoking status: Former Smoker    Packs/day: 0.25    Years: 20.00    Pack years: 5.00    Quit date: 08/07/1996    Years since quitting: 23.4  . Smokeless tobacco: Never Used  Substance and Sexual Activity  . Alcohol use: No    Comment: upper limits of week, 1-2 daily- until 07/2018, no longer having alcohol per patient  . Drug use: No  . Sexual activity: Yes  Other Topics Concern  . Not on file  Social History Narrative  . Not on file   Social Determinants of Health   Financial Resource Strain:   . Difficulty of Paying Living Expenses:   Food Insecurity:   . Worried About Charity fundraiser in the Last Year:   . Arboriculturist in the Last Year:   Transportation Needs:   . Film/video editor (Medical):   Marland Kitchen Lack of Transportation (Non-Medical):   Physical Activity:   . Days of Exercise per Week:   . Minutes of Exercise per Session:   Stress:   . Feeling of Stress :   Social Connections:   . Frequency of Communication with Friends and Family:   . Frequency of Social Gatherings with Friends and Family:   . Attends Religious Services:   . Active Member of Clubs or Organizations:   . Attends Archivist Meetings:   Marland Kitchen Marital Status:     Tobacco Counseling Counseling given: Not Answered   Clinical Intake:                 Diabetic?yes         Activities of Daily Living No flowsheet data found.  Patient Care Team: Fayrene Helper, MD as PCP - General Dorothy Spark, MD as PCP - Cardiology (Cardiology) Rothbart, Cristopher Estimable, MD (Cardiology) Trula Slade, DPM as Consulting Physician (Podiatry) Ashok Pall, MD as Consulting Physician (Neurosurgery)  Indicate any recent Medical Services you may have received from other than [facility] providers in the  past year (date may be approximate).     Assessment:   This is a routine wellness examination for [name].  Hearing/Vision screen No exam data present  Dietary issues and exercise activities discussed:    Goals    . eat 3 meals a day    . maintaining  Patient states that she wants to stay happy and healthy the way that she is      Depression Screen PHQ 2/9 Scores 01/20/2019 11/18/2018 07/21/2018 04/01/2018 01/15/2018 10/23/2017 04/08/2017  PHQ - 2 Score 0 0 0 0 0 0 0  PHQ- 9 Score - - - - 2 - -    Fall Risk Fall Risk  09/02/2019 04/13/2019 01/20/2019 11/18/2018 10/08/2018  Falls in the past year? 0 0 0 0 0  Number falls in past yr: 0 0 - - 0  Injury with Fall? 0 0 0 0 0  Follow up - - - - -    Any stairs in or around the home? No  If so, are there any without handrails? No  Home free of loose throw rugs in walkways, pet beds, electrical cords, etc? Yes  Adequate lighting in your home to reduce risk of falls? Yes   ASSISTIVE DEVICES UTILIZED TO PREVENT FALLS:  Life alert? No  Use of a cane, walker or w/c? No  Grab bars in the bathroom? No  Shower chair or bench in shower? No  Elevated toilet seat or a handicapped toilet? No   TIMED UP AND GO:  Was the test performed? No .  Length of time to ambulate 10 feet: NA sec.     Cognitive Function:     6CIT Screen 01/20/2019 01/15/2018 08/07/2016  What Year? 0 points 0 points 0 points  What month? 0 points 0 points 0 points  What time? 0 points 0 points 0 points  Count back from 20 0 points 0 points 0 points  Months in reverse 0 points 4 points 0 points  Repeat phrase 0 points 0 points 0 points  Total Score 0 4 0    Immunizations Immunization History  Administered Date(s) Administered  . Td 01/03/2005    TDAP status: Up to date Flu Vaccine status: Up to date Pneumococcal vaccine status: Up to date Covid-19 vaccine status: Declined, Education has been provided regarding the importance of this vaccine but patient  still declined. Advised may receive this vaccine at local pharmacy or Health Dept.or vaccine clinic. Aware to provide a copy of the vaccination record if obtained from local pharmacy or Health Dept. Verbalized acceptance and understanding.  Qualifies for Shingles Vaccine? Yes   Zostavax completed No   Shingrix Completed?: No.    Education has been provided regarding the importance of this vaccine. Patient has been advised to call insurance company to determine out of pocket expense if they have not yet received this vaccine. Advised may also receive vaccine at local pharmacy or Health Dept. Verbalized acceptance and understanding.  Screening Tests Health Maintenance  Topic Date Due  . COVID-19 Vaccine (1) Never done  . MAMMOGRAM  04/26/2019  . PNA vac Low Risk Adult (1 of 2 - PCV13) 04/12/2020 (Originally 01/11/2013)  . TETANUS/TDAP  09/01/2020 (Originally 01/04/2015)  . INFLUENZA VACCINE  01/31/2020  . HEMOGLOBIN A1C  04/08/2020  . FOOT EXAM  04/12/2020  . OPHTHALMOLOGY EXAM  06/15/2020  . COLONOSCOPY  12/08/2023  . DEXA SCAN  Completed  . Hepatitis C Screening  Completed    Health Maintenance  Health Maintenance Due  Topic Date Due  . COVID-19 Vaccine (1) Never done  . MAMMOGRAM  04/26/2019    Colorectal cancer screening: Completed 12/07/13. Repeat every 12/08/23 years Mammogram status: Completed 04/25/17. Repeat every year Bone Density status: Completed 11/06/13. Results reflect: Bone density results: NORMAL. Repeat every 5 years.  Lung Cancer  Screening: (Low Dose CT Chest recommended if Age 50-80 years, 30 pack-year currently smoking OR have quit w/in 15years.) does qualify.   Lung Cancer Screening Referral: No pt declined  Additional Screening:  Hepatitis C Screening: does not qualify;   Vision Screening: Recommended annual ophthalmology exams for early detection of glaucoma and other disorders of the eye. Is the patient up to date with their annual eye exam?  Yes  Who is the  provider or what is the name of the office in which the patient attends annual eye exams? [facility] in [place]  If pt is not established with a provider, would they like to be referred to a provider to establish care? No .   Dental Screening: Recommended annual dental exams for proper oral hygiene  Community Resource Referral / Chronic Care Management: CRR required this visit?  No   CCM required this visit?  No      Plan:     1. Encounter for Medicare annual wellness exam   I have personally reviewed and noted the following in the patient's chart:   . Medical and social history . Use of alcohol, tobacco or illicit drugs  . Current medications and supplements . Functional ability and status . Nutritional status . Physical activity . Advanced directives . List of other physicians . Hospitalizations, surgeries, and ER visits in previous 12 months . Vitals . Screenings to include cognitive, depression, and falls . Referrals and appointments  In addition, I have reviewed and discussed with patient certain preventive protocols, quality metrics, and best practice recommendations. A written personalized care plan for preventive services as well as general preventive health recommendations were provided to patient.     Shelda Altes, CMA   01/21/2020   I provided 25 minutes of non-face-to-face time during this encounter.

## 2020-01-25 ENCOUNTER — Encounter: Payer: Self-pay | Admitting: *Deleted

## 2020-02-02 ENCOUNTER — Ambulatory Visit: Payer: Medicare Other | Admitting: Family Medicine

## 2020-02-02 ENCOUNTER — Other Ambulatory Visit: Payer: Self-pay

## 2020-02-02 ENCOUNTER — Ambulatory Visit (INDEPENDENT_AMBULATORY_CARE_PROVIDER_SITE_OTHER): Payer: Medicare Other | Admitting: *Deleted

## 2020-02-02 DIAGNOSIS — Z5181 Encounter for therapeutic drug level monitoring: Secondary | ICD-10-CM

## 2020-02-02 LAB — POCT INR: INR: 2.6 (ref 2.0–3.0)

## 2020-02-02 NOTE — Patient Instructions (Signed)
Description   Continue same dosage 1 tablet (2.5mg ) daily.  Recheck INR in 6 weeks. Please call [phone] if you have any changes in your medications or up coming procedures.

## 2020-02-05 ENCOUNTER — Other Ambulatory Visit: Payer: Self-pay | Admitting: Family Medicine

## 2020-02-05 DIAGNOSIS — E1121 Type 2 diabetes mellitus with diabetic nephropathy: Secondary | ICD-10-CM

## 2020-03-17 ENCOUNTER — Ambulatory Visit (INDEPENDENT_AMBULATORY_CARE_PROVIDER_SITE_OTHER): Payer: Medicare Other

## 2020-03-17 ENCOUNTER — Other Ambulatory Visit: Payer: Self-pay

## 2020-03-17 DIAGNOSIS — Z5181 Encounter for therapeutic drug level monitoring: Secondary | ICD-10-CM | POA: Diagnosis not present

## 2020-03-17 LAB — POCT INR: INR: 3.9 — AB (ref 2.0–3.0)

## 2020-03-17 NOTE — Patient Instructions (Signed)
Description   Skip today's dosage of Warfarin, then resume same dosage 1 tablet (2.5mg ) daily.  Recheck INR in 3 weeks. Please call [phone] if you have any changes in your medications or up coming procedures.

## 2020-03-20 ENCOUNTER — Other Ambulatory Visit: Payer: Self-pay | Admitting: Family Medicine

## 2020-03-21 ENCOUNTER — Other Ambulatory Visit: Payer: Self-pay | Admitting: Family Medicine

## 2020-03-24 ENCOUNTER — Telehealth: Payer: Self-pay

## 2020-03-24 NOTE — Telephone Encounter (Signed)
Wants to know if she can go back on lyrica for the nerve pain in her legs due to neuropathy. Please advise

## 2020-03-25 ENCOUNTER — Other Ambulatory Visit: Payer: Self-pay

## 2020-03-25 DIAGNOSIS — E1121 Type 2 diabetes mellitus with diabetic nephropathy: Secondary | ICD-10-CM

## 2020-03-25 DIAGNOSIS — E785 Hyperlipidemia, unspecified: Secondary | ICD-10-CM

## 2020-03-25 NOTE — Progress Notes (Signed)
Called back and appt schedueld

## 2020-03-25 NOTE — Telephone Encounter (Signed)
She needs an appointment when her hBA1C can be checked in the office, needs updated lipid etc, will address then, no appt in system with [place], can be annual exam if due no appt in the system, will address the lyrica at visit, if it worked will plan to resume at that time. Has care gaps to close, mammogram overdue

## 2020-03-30 ENCOUNTER — Other Ambulatory Visit: Payer: Self-pay | Admitting: Family Medicine

## 2020-03-30 NOTE — Telephone Encounter (Signed)
Labs ordered and pt aware, appt for Monday 10/4

## 2020-03-31 ENCOUNTER — Other Ambulatory Visit: Payer: Self-pay

## 2020-03-31 ENCOUNTER — Other Ambulatory Visit: Payer: Medicare Other | Admitting: *Deleted

## 2020-03-31 DIAGNOSIS — E1121 Type 2 diabetes mellitus with diabetic nephropathy: Secondary | ICD-10-CM | POA: Diagnosis not present

## 2020-03-31 DIAGNOSIS — E785 Hyperlipidemia, unspecified: Secondary | ICD-10-CM | POA: Diagnosis not present

## 2020-04-01 ENCOUNTER — Other Ambulatory Visit: Payer: Self-pay | Admitting: Cardiology

## 2020-04-01 ENCOUNTER — Other Ambulatory Visit: Payer: Self-pay | Admitting: Family Medicine

## 2020-04-01 DIAGNOSIS — I119 Hypertensive heart disease without heart failure: Secondary | ICD-10-CM

## 2020-04-01 DIAGNOSIS — Z86711 Personal history of pulmonary embolism: Secondary | ICD-10-CM

## 2020-04-01 DIAGNOSIS — I739 Peripheral vascular disease, unspecified: Secondary | ICD-10-CM

## 2020-04-01 DIAGNOSIS — E785 Hyperlipidemia, unspecified: Secondary | ICD-10-CM

## 2020-04-01 DIAGNOSIS — Z5181 Encounter for therapeutic drug level monitoring: Secondary | ICD-10-CM

## 2020-04-01 LAB — LIPID PANEL
Chol/HDL Ratio: 3.5 ratio (ref 0.0–4.4)
Cholesterol, Total: 153 mg/dL (ref 100–199)
HDL: 44 mg/dL (ref >39)
LDL Chol Calc (NIH): 86 mg/dL (ref 0–99)
Triglycerides: 126 mg/dL (ref 0–149)
VLDL Cholesterol Cal: 23 mg/dL (ref 5–40)

## 2020-04-01 LAB — CMP14+EGFR
ALT: 10 IU/L (ref 0–32)
AST: 12 IU/L (ref 0–40)
Albumin/Globulin Ratio: 1.4 (ref 1.2–2.2)
Albumin: 4.1 g/dL (ref 3.7–4.7)
Alkaline Phosphatase: 111 IU/L (ref 44–121)
BUN/Creatinine Ratio: 13 (ref 12–28)
BUN: 14 mg/dL (ref 8–27)
Bilirubin Total: 0.4 mg/dL (ref 0.0–1.2)
CO2: 28 mmol/L (ref 20–29)
Calcium: 9.2 mg/dL (ref 8.7–10.3)
Chloride: 100 mmol/L (ref 96–106)
Creatinine, Ser: 1.07 mg/dL — ABNORMAL HIGH (ref 0.57–1.00)
GFR calc Af Amer: 60 mL/min/{1.73_m2} (ref >59)
GFR calc non Af Amer: 52 mL/min/{1.73_m2} — ABNORMAL LOW (ref >59)
Globulin, Total: 3 g/dL (ref 1.5–4.5)
Glucose: 139 mg/dL — ABNORMAL HIGH (ref 65–99)
Potassium: 3.8 mmol/L (ref 3.5–5.2)
Sodium: 141 mmol/L (ref 134–144)
Total Protein: 7.1 g/dL (ref 6.0–8.5)

## 2020-04-01 LAB — HEMOGLOBIN A1C
Est. average glucose Bld gHb Est-mCnc: 157 mg/dL
Hgb A1c MFr Bld: 7.1 % — ABNORMAL HIGH (ref 4.8–5.6)

## 2020-04-04 ENCOUNTER — Encounter: Payer: Self-pay | Admitting: Family Medicine

## 2020-04-04 ENCOUNTER — Other Ambulatory Visit: Payer: Self-pay

## 2020-04-04 ENCOUNTER — Ambulatory Visit (INDEPENDENT_AMBULATORY_CARE_PROVIDER_SITE_OTHER): Payer: Medicare Other | Admitting: Family Medicine

## 2020-04-04 VITALS — BP 130/82 | HR 78 | Resp 16 | Ht 66.0 in | Wt 222.0 lb

## 2020-04-04 DIAGNOSIS — E1121 Type 2 diabetes mellitus with diabetic nephropathy: Secondary | ICD-10-CM

## 2020-04-04 DIAGNOSIS — Z5181 Encounter for therapeutic drug level monitoring: Secondary | ICD-10-CM

## 2020-04-04 DIAGNOSIS — M5410 Radiculopathy, site unspecified: Secondary | ICD-10-CM

## 2020-04-04 DIAGNOSIS — I119 Hypertensive heart disease without heart failure: Secondary | ICD-10-CM

## 2020-04-04 DIAGNOSIS — I10 Essential (primary) hypertension: Secondary | ICD-10-CM | POA: Diagnosis not present

## 2020-04-04 DIAGNOSIS — I739 Peripheral vascular disease, unspecified: Secondary | ICD-10-CM

## 2020-04-04 DIAGNOSIS — G4730 Sleep apnea, unspecified: Secondary | ICD-10-CM | POA: Diagnosis not present

## 2020-04-04 DIAGNOSIS — Z1231 Encounter for screening mammogram for malignant neoplasm of breast: Secondary | ICD-10-CM

## 2020-04-04 DIAGNOSIS — Z86711 Personal history of pulmonary embolism: Secondary | ICD-10-CM

## 2020-04-04 DIAGNOSIS — E785 Hyperlipidemia, unspecified: Secondary | ICD-10-CM

## 2020-04-04 MED ORDER — POTASSIUM CHLORIDE CRYS ER 20 MEQ PO TBCR: EXTENDED_RELEASE_TABLET | ORAL | 1 refills | Status: DC

## 2020-04-04 MED ORDER — GABAPENTIN 300 MG PO CAPS: 300.0000 mg | ORAL_CAPSULE | Freq: Three times a day (TID) | ORAL | 4 refills | Status: DC

## 2020-04-04 NOTE — Assessment & Plan Note (Signed)
worsenign fatigue, never  Filled CPAP prescribed and needs re evaluation, c/o severe fatigue

## 2020-04-04 NOTE — Patient Instructions (Addendum)
Annual physical exam in office with mD end February, call if you need me before  Microalb in office  We will add TSH and vit D level to recent lab  CBC, cmp and EGFr and HBA1C 5 days  Before Feb appointment  New is gabapentin for left leg discomfort, start one at bedtime, after 3 days increase to one twice daily if needed. After 1 week, increase to 3/ day, either every 8 hours, or 1 in the morning and 2 at bedtime, if needed  Blood sugar and blood pressure  Good, no med change  Flu vaccine refused and  All others, but we are still living, changes can be made  You are referred for mammogram PLEASE schedule the appointment  You need to see lung specialist, mild sleep apnea diagnosed years ago, needs evaluation and management as your fatigue is worsening. This will protect your  Heart, lungs, and brain. Referral to [facility] office   You are  referred for MRI lumbar and neurosurgery eval   It is important that you exercise regularly at least 30 minutes 5 times a week. If you develop chest pain, have severe difficulty breathing, or feel very tired, stop exercising immediately and seek medical attention  Think about what you will eat, plan ahead. Choose " clean, green, fresh or frozen" over canned, processed or packaged foods which are more sugary, salty and fatty. 70 to 75% of food eaten should be vegetables and fruit. Three meals at set times with snacks allowed between meals, but they must be fruit or vegetables. Aim to eat over a 12 hour period , example 7 am to 7 pm, and STOP after  your last meal of the day. Drink water,generally about 64 ounces per day, no other drink is as healthy. Fruit juice is best enjoyed in a healthy way, by EATING the fruit. Thanks for choosing [facility], we consider it a privelige to serve you.

## 2020-04-04 NOTE — Progress Notes (Signed)
Stephanie Singleton     MRN: [id number]      DOB: 09/22/1945   HPI Stephanie Singleton is here for follow up and re-evaluation of chronic medical conditions, medication management and review of any available recent lab and radiology data.  Preventive health is updated, specifically  Cancer screening and Immunization.  Refuses all immunization Will schedule her past due mammogram Approximate 5 month h/o left lower extremity discomfort, sometimes burning, other times like cold water. Also reports LLE weakness and imbalance, denies falls, denies new incontinence of stool or urine C/o excessive fatigue , has sleep study showing OSA, script for CPAP written ,never followed through, understands and accepts the need to do so. Denies polyuria, polydipsia, blurred vision , or hypoglycemic episodes.  The PT denies any adverse reactions to current medications since the last visit.     ROS Denies recent fever or chills.c/o chronic and worsening  fatigue Denies sinus pressure, nasal congestion, ear pain or sore throat. Denies chest congestion, productive cough or wheezing. Denies chest pains, palpitations and leg swelling Denies abdominal pain, nausea, vomiting,diarrhea or constipation.   Denies dysuria, frequency, hesitancy or incontinence.  Denies skin break down or rash.   PE  BP 130/82   Pulse 78   Resp 16   Ht 5\' 6"  (1.676 m)   Wt 222 lb 0.6 oz (100.7 kg)   SpO2 98%   BMI 35.84 kg/m   Patient alert and oriented and in no cardiopulmonary distress.  HEENT: No facial asymmetry, EOMI,     Neck adequate ROM though reduced .  Chest: Clear to auscultation bilaterally.  CVS: S1, S2 no murmurs, no S3.Regular rate.  ABD: Soft non tender.   Ext: No edema  MS: decreased  ROM lumbar  Spine, adequate In shoulders, hips and knees.  Skin: Intact, no ulcerations or rash noted.  Psych: Good eye contact, normal affect. Memory intact not anxious or depressed appearing.  CNS: CN 2-12 intact,  power,  normal throughout decreased sensation in LLE lateral aspect  Assessment & Plan  Hypertension goal BP (blood pressure) < 130/80 Controlled, no change in medication DASH diet and commitment to daily physical activity for a minimum of 30 minutes discussed and encouraged, as a part of hypertension management. The importance of attaining a healthy weight is also discussed.  BP/Weight 04/04/2020 01/21/2020 09/02/2019 04/13/2019 03/24/2019 01/20/2019 3/32/9518  Systolic BP 841 660 630 160 109 323 557  Diastolic BP 82 94 94 94 87 82 82  Wt. (Lbs) 222.04 223 223 223 220 215 215  BMI 35.84 35.99 35.99 35.99 35.51 34.7 34.7       Morbid obesity (HCC) Obesity linked with HTN and arthritis  Patient re-educated about  the importance of commitment to a  minimum of 150 minutes of exercise per week as able.  The importance of healthy food choices with portion control discussed, as well as eating regularly and within a 12 hour window most days. The need to choose "clean , green" food 50 to 75% of the time is discussed, as well as to make water the primary drink and set a goal of 64 ounces water daily.    Weight /BMI 04/04/2020 01/21/2020 09/02/2019  WEIGHT 222 lb 0.6 oz 223 lb 223 lb  HEIGHT 5\' 6"  5\' 6"  5\' 6"   BMI 35.84 kg/m2 35.99 kg/m2 35.99 kg/m2      Back pain with left-sided radiculopathy Progressive eakness and decreased sensation lateral aspect LLE, needs updated MRI, re eval by Neurosurgery. Strart gabapentin  300 mg , may titrate up 900 mg / 24 hours  Type 2 diabetes with nephropathy ([place]) Stephanie Singleton is reminded of the importance of commitment to daily physical activity for 30 minutes or more, as able and the need to limit carbohydrate intake to 30 to 60 grams per meal to help with blood sugar control.   The need to take medication as prescribed, test blood sugar as directed, and to call between visits if there is a concern that blood sugar is uncontrolled is also discussed.   Ms.  Singleton is reminded of the importance of daily foot exam, annual eye examination, and good blood sugar, blood pressure and cholesterol control.  Diabetic Labs Latest Ref Rng & Units 03/31/2020 10/08/2019 01/22/2019 07/15/2018 04/01/2018  HbA1c 4.8 - 5.6 % 7.1(H) 7.5(H) 7.0(H) 7.0(H) 7.0(H)  Microalbumin Not estab mg/dL - - - - -  Micro/Creat Ratio <30 mcg/mg creat - - - - -  Chol 100 - 199 mg/dL 153 160 159 160 197  HDL >39 mg/dL 44 43 42 45 35(L)  Calc LDL 0 - 99 mg/dL 86 93 86 86 122(H)  Triglycerides 0 - 149 mg/dL 126 134 154(H) 144 198(H)  Creatinine 0.57 - 1.00 mg/dL 1.07(H) 1.05(H) 1.14(H) 0.93 1.01(H)   BP/Weight 04/04/2020 01/21/2020 09/02/2019 04/13/2019 03/24/2019 01/20/2019 1/63/8466  Systolic BP 599 357 017 793 903 009 233  Diastolic BP 82 94 94 94 87 82 82  Wt. (Lbs) 222.04 223 223 223 220 215 215  BMI 35.84 35.99 35.99 35.99 35.51 34.7 34.7   Foot/eye exam completion dates Latest Ref Rng & Units 06/15/2019 06/15/2019  Eye Exam No Retinopathy Retinopathy(A) Retinopathy(A)  Foot Form Completion - - -        Sleep apnea in adult worsenign fatigue, never  Filled CPAP prescribed and needs re evaluation, c/o severe fatigue

## 2020-04-04 NOTE — Assessment & Plan Note (Signed)
Obesity linked with HTN and arthritis  Patient re-educated about  the importance of commitment to a  minimum of 150 minutes of exercise per week as able.  The importance of healthy food choices with portion control discussed, as well as eating regularly and within a 12 hour window most days. The need to choose "clean , green" food 50 to 75% of the time is discussed, as well as to make water the primary drink and set a goal of 64 ounces water daily.    Weight /BMI 04/04/2020 01/21/2020 09/02/2019  WEIGHT 222 lb 0.6 oz 223 lb 223 lb  HEIGHT 5\' 6"  5\' 6"  5\' 6"   BMI 35.84 kg/m2 35.99 kg/m2 35.99 kg/m2

## 2020-04-04 NOTE — Assessment & Plan Note (Signed)
Progressive eakness and decreased sensation lateral aspect LLE, needs updated MRI, re eval by Neurosurgery. Strart gabapentin 300 mg , may titrate up 900 mg / 24 hours

## 2020-04-04 NOTE — Assessment & Plan Note (Signed)
Controlled, no change in medication DASH diet and commitment to daily physical activity for a minimum of 30 minutes discussed and encouraged, as a part of hypertension management. The importance of attaining a healthy weight is also discussed.  BP/Weight 04/04/2020 01/21/2020 09/02/2019 04/13/2019 03/24/2019 01/20/2019 8/37/5423  Systolic BP 702 301 720 910 681 661 969  Diastolic BP 82 94 94 94 87 82 82  Wt. (Lbs) 222.04 223 223 223 220 215 215  BMI 35.84 35.99 35.99 35.99 35.51 34.7 34.7

## 2020-04-04 NOTE — Assessment & Plan Note (Signed)
Stephanie Singleton is reminded of the importance of commitment to daily physical activity for 30 minutes or more, as able and the need to limit carbohydrate intake to 30 to 60 grams per meal to help with blood sugar control.   The need to take medication as prescribed, test blood sugar as directed, and to call between visits if there is a concern that blood sugar is uncontrolled is also discussed.   Stephanie Singleton is reminded of the importance of daily foot exam, annual eye examination, and good blood sugar, blood pressure and cholesterol control.  Diabetic Labs Latest Ref Rng & Units 03/31/2020 10/08/2019 01/22/2019 07/15/2018 04/01/2018  HbA1c 4.8 - 5.6 % 7.1(H) 7.5(H) 7.0(H) 7.0(H) 7.0(H)  Microalbumin Not estab mg/dL - - - - -  Micro/Creat Ratio <30 mcg/mg creat - - - - -  Chol 100 - 199 mg/dL 153 160 159 160 197  HDL >39 mg/dL 44 43 42 45 35(L)  Calc LDL 0 - 99 mg/dL 86 93 86 86 122(H)  Triglycerides 0 - 149 mg/dL 126 134 154(H) 144 198(H)  Creatinine 0.57 - 1.00 mg/dL 1.07(H) 1.05(H) 1.14(H) 0.93 1.01(H)   BP/Weight 04/04/2020 01/21/2020 09/02/2019 04/13/2019 03/24/2019 01/20/2019 3/66/4403  Systolic BP 474 259 563 875 643 329 518  Diastolic BP 82 94 94 94 87 82 82  Wt. (Lbs) 222.04 223 223 223 220 215 215  BMI 35.84 35.99 35.99 35.99 35.51 34.7 34.7   Foot/eye exam completion dates Latest Ref Rng & Units 06/15/2019 06/15/2019  Eye Exam No Retinopathy Retinopathy(A) Retinopathy(A)  Foot Form Completion - - -

## 2020-04-05 ENCOUNTER — Other Ambulatory Visit (HOSPITAL_COMMUNITY)
Admission: RE | Admit: 2020-04-05 | Discharge: 2020-04-05 | Disposition: A | Discharge status: Home / Self Care | Payer: Medicare Other | Source: Other Acute Inpatient Hospital | Attending: Family Medicine | Admitting: Family Medicine

## 2020-04-05 DIAGNOSIS — E1121 Type 2 diabetes mellitus with diabetic nephropathy: Secondary | ICD-10-CM | POA: Diagnosis not present

## 2020-04-06 LAB — MICROALBUMIN, URINE: Microalb, Ur: 9.3 ug/mL — ABNORMAL HIGH

## 2020-04-07 ENCOUNTER — Ambulatory Visit (INDEPENDENT_AMBULATORY_CARE_PROVIDER_SITE_OTHER): Payer: Medicare Other | Admitting: *Deleted

## 2020-04-07 ENCOUNTER — Other Ambulatory Visit: Payer: Self-pay

## 2020-04-07 DIAGNOSIS — Z5181 Encounter for therapeutic drug level monitoring: Secondary | ICD-10-CM | POA: Diagnosis not present

## 2020-04-07 LAB — POCT INR: INR: 6.6 — AB (ref 2.0–3.0)

## 2020-04-07 LAB — PROTIME-INR
INR: 5.7 (ref 0.9–1.2)
Prothrombin Time: 56.8 s — ABNORMAL HIGH (ref 9.1–12.0)

## 2020-04-07 NOTE — Patient Instructions (Signed)
Description   Spoke with pt and advised to not to take any Warfarin today, no Warfarin tomorrow, and no Warfarin Saturday then start 1 tablet (2.5mg ) daily except 1/2 tablet on Tuesdays. Advised to report to Er with bleeding, falls, or any incidents.  Recheck INR in 1 week. Please call [phone] if you have any changes in your medications or up coming procedures.

## 2020-04-08 ENCOUNTER — Telehealth: Payer: Self-pay

## 2020-04-08 ENCOUNTER — Encounter: Payer: Self-pay | Admitting: Internal Medicine

## 2020-04-08 LAB — SPECIMEN STATUS REPORT

## 2020-04-08 LAB — VITAMIN D 25 HYDROXY (VIT D DEFICIENCY, FRACTURES)

## 2020-04-08 LAB — TSH: TSH: 0.947 u[IU]/mL (ref 0.450–4.500)

## 2020-04-08 NOTE — Telephone Encounter (Signed)
Spoke with Stephanie Singleton and she stated that she does not do anything at [facility]. She would like to go to [place]. She also stated that she has another appt on 04/18/2020 and she wanted to see if she could get scheduled to have that appt on same day as other appt.

## 2020-04-14 ENCOUNTER — Other Ambulatory Visit: Payer: Self-pay | Admitting: Family Medicine

## 2020-04-14 DIAGNOSIS — M5410 Radiculopathy, site unspecified: Secondary | ICD-10-CM

## 2020-04-14 NOTE — Telephone Encounter (Signed)
LVM letting pt know this has been changed to [facility] imaging also sent reminder in the mail

## 2020-04-14 NOTE — Telephone Encounter (Signed)
Pt is scheduled with [facility] 05-05-20 at 10:20 be there at 9:40 [address] cotton clothing no jewlery needs id insurance card and a facemask

## 2020-04-18 ENCOUNTER — Other Ambulatory Visit: Payer: Self-pay

## 2020-04-18 ENCOUNTER — Ambulatory Visit (INDEPENDENT_AMBULATORY_CARE_PROVIDER_SITE_OTHER): Payer: Medicare Other

## 2020-04-18 DIAGNOSIS — Z5181 Encounter for therapeutic drug level monitoring: Secondary | ICD-10-CM | POA: Diagnosis not present

## 2020-04-18 LAB — POCT INR: INR: 2.7 (ref 2.0–3.0)

## 2020-04-18 NOTE — Patient Instructions (Addendum)
Description   Continue on same dosage 1 tablet daily except 1/2 tablet on Tuesdays. Recheck INR in 3 weeks. Please call [phone] if you have any changes in your medications or up coming procedures.

## 2020-04-19 ENCOUNTER — Ambulatory Visit (HOSPITAL_COMMUNITY): Payer: Medicare Other

## 2020-04-30 ENCOUNTER — Encounter: Payer: Self-pay | Admitting: Physician Assistant

## 2020-04-30 NOTE — Progress Notes (Addendum)
Cardiology Office Note    Date:  05/03/2020   ID:  Stephanie Singleton, DOB 1945/10/13, MRN [id number]  PCP:  Fayrene Helper, MD  Cardiologist:  Ena Dawley, MD  Electrophysiologist:  None   Chief Complaint: f/u CHF  History of Present Illness:   Stephanie Singleton is a 74 y.o. 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 Repeat CTA 2010 was negative for recurrent PE. She was remotely followed by Jory Sims but established with Dr. Meda Coffee in 04/2016. LE arterial duplex for leg pain 05/2016 was normal. I met her in clinic in 11/2016 at which time she was reporting chronic fatigue x 15 years with progression, as well as gradual DOE over the last year. She had remarried about 2 years prior and reported she'd slowed down a lot and gained about 30lb. Of note, she also has 13 children which includes 3 sets of twins. 2D Echo 01/08/17 showed EF 60-65%, grade 1 DD, mildly dilated ascending aorta, no significant pulm HTN, normal CVP. Nuc 01/08/17 was normal. Referral to pulmonology was recommended but not pursued by the patient. She previously self-discontinued amlodipine due to lower exremity edema. At a prior OV with Dr. Meda Coffee, Lyrica was prescribed for lower extremity pain/neuropathy. She was eventually able to stop this. She has previously been introduced to the idea of switching to [place] but wishes to remain on Coumadin. Notes outline that PE was felt to be unprovoked, prompting lifelong anticoagulation as tolerated. Repeat sleep study 2018 showed no significant OSA on home study - in-lab study was recommended but patient declined. She has no interest in treatment for this at this time.  She is seen back for follow-up feeling well  without acute complaints. She declined to have EKG today, citing that she does not think she needs it. No chest pain, dyspnea, orthopnea, syncope, palpitations reported. She uses torsemide only sparingly. BP is elevated today (has been on the cusp before). She reports readings 130s, occasionally 140s at home. She is hesitant to be aggressive with her regimen due to chronic fatigue. She would like to discuss an alternative for HCTZ due to the recent cancer ingredient recalls.  Labwork independently reviewed: 03/31/20 TSH wnl, LDL 86, Cr 1.07 (CKD II), K 3.8, LFTs ok   Past Medical History:  Diagnosis Date  . Chronic diastolic CHF (congestive heart failure) ([facility])   . CKD (chronic kidney disease), stage II   . Diabetes mellitus DX X 1 YR   USE METFORMIN ACCORDING TO CBG RESULT  . Dilatation of aorta ([facility])   . Embolism - blood clot 2004   Pulmonary .on coumadin  . GERD (gastroesophageal reflux disease)    ON PREVACID  . HTN (hypertension)    EKG 10/23 IN EPIC  . Hyperlipidemia   . 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 OSA (obstructive sleep apnea) DX 8 YRS AGO.DOES NOT WEAR MASK  . Pulmonary embolism ([facility])    a. 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  . Tumor of soft tissue of neck     Past Surgical History:  Procedure Laterality Date  . CERVICAL FUSION      Current Medications: Current Meds  Medication Sig  .  ACCU-CHEK AVIVA PLUS test strip USE TO TEST ONCE DAILY  . Accu-Chek FastClix Lancets MISC USE 1 UNIT 2 (TWO) TIMES A DAY.  Marland Kitchen gabapentin (NEURONTIN) 300 MG capsule Take 1 capsule (300 mg total) by mouth 3 (three) times daily.  Marland Kitchen glipiZIDE (GLUCOTROL XL) 2.5 MG 24 hr tablet TAKE 1 TABLET (2.5 MG TOTAL) BY MOUTH DAILY WITH BREAKFAST.  . hydrochlorothiazide (HYDRODIURIL) 25 MG tablet TAKE 1 TABLET BY MOUTH EVERY DAY  . hydrOXYzine (ATARAX/VISTARIL) 50 MG tablet TAKE 1 TABLET BY MOUTH EVERYDAY AT BEDTIME  .  olmesartan (BENICAR) 40 MG tablet TAKE 1 TABLET BY MOUTH EVERY DAY  . pantoprazole (PROTONIX) 40 MG tablet TAKE 1 TABLET BY MOUTH EVERY DAY  . potassium chloride SA (KLOR-CON M20) 20 MEQ tablet TAKE 1 TABLET TWICE A WEEK WHEN YOU TAKE DEMADEX (TORSEMIDE)  . pravastatin (PRAVACHOL) 20 MG tablet TAKE 1 TABLET BY MOUTH EVERY DAY  . torsemide (DEMADEX) 20 MG tablet TAKE 1 TABLET 2 TIMES A WEEK AS NEEDED FOR SWELLING. Please make overdue appt with Dr. Meda Coffee before anymore refills. 1st attempt  . TRADJENTA 5 MG TABS tablet TAKE 1 TABLET BY MOUTH EVERY DAY  . warfarin (COUMADIN) 2.5 MG tablet TAKE 1 TABLET DAILY OR AS DIRECTED BY COUMADIN CLINIC  . [DISCONTINUED] cetirizine (ZYRTEC) 10 MG tablet Take 1 tablet (10 mg total) by mouth daily.  . [DISCONTINUED] Simethicone 125 MG CAPS One capsule by mouth , three times daily as needed, for excess gas  . [DISCONTINUED] tiZANidine (ZANAFLEX) 4 MG tablet TAKE ONE TABLET TWO TIMES DAILY FOR 1 WEEK, THEN AT BEDTIME AS NEEDED      Allergies:   Aspirin and Lisinopril   Social History   Socioeconomic History  . Marital status: Divorced    Spouse name: Not on file  . Number of children: 45  . Years of education: Not on file  . Highest education level: Some college, no degree  Occupational History    Employer: DISABLED     Comment: [employer]  Tobacco Use  . Smoking status: Former Smoker    Packs/day: 0.25    Years: 20.00    Pack years: 5.00    Quit date: 08/07/1996    Years since quitting: 23.7  . Smokeless tobacco: Never Used  Substance and Sexual Activity  . Alcohol use: No    Comment: upper limits of week, 1-2 daily- until 07/2018, no longer having alcohol per patient  . Drug use: No  . Sexual activity: Yes  Other Topics Concern  . Not on file  Social History Narrative  . Not on file   Social Determinants of Health   Financial Resource Strain: Low Risk   . Difficulty of Paying Living Expenses: Not hard at all  Food Insecurity: No Food  Insecurity  . Worried About Charity fundraiser in the Last Year: Never true  . Ran Out of Food in the Last Year: Never true  Transportation Needs: No Transportation Needs  . Lack of Transportation (Medical): No  . Lack of Transportation (Non-Medical): No  Physical Activity: Inactive  . Days of Exercise per Week: 0 days  . Minutes of Exercise per Session: 0 min  Stress: No Stress Concern Present  . Feeling of Stress : Only a little  Social Connections: Moderately Isolated  . Frequency of Communication with Friends and Family: More than three times a week  . Frequency of Social Gatherings with Friends and Family: More than three times a week  . Attends  Religious Services: Never  . Active Member of Clubs or Organizations: No  . Attends Archivist Meetings: Never  . Marital Status: Married     Family History:  The patient's family history includes Bladder Cancer (age of onset: 19) in her sister; Breast cancer (age of onset: 83) in her sister; Diabetes in her daughter, father, and son; Heart disease in her brother and mother; Hypertension in her mother; Liver disease in her son; Peripheral vascular disease in her brother and father; Stroke in her mother. There is no history of Colon cancer, Pancreatic cancer, Stomach cancer, or Esophageal cancer.  ROS:   Please see the history of present illness.  All other systems are reviewed and otherwise negative.    EKGs/Labs/Other Studies Reviewed:    Studies reviewed are outlined and summarized above. Reports included below if pertinent.  2D echo 12/2016 Left ventricle: The cavity size was normal. There was mild  concentric hypertrophy. Systolic function was normal. The  estimated ejection fraction was in the range of 60% to 65%. Wall  motion was normal; there were no regional wall motion  abnormalities. Doppler parameters are consistent with abnormal  left ventricular relaxation (grade 1 diastolic dysfunction). The    E/e&' ratio is between 8-15, suggesting indeterminate LV filling  pressure.  - Aorta: Ascending aortic diameter: 39 mm (S).  - Ascending aorta: The ascending aorta was mildly dilated.  - Mitral valve: Calcified annulus. There was trivial regurgitation.  - Tricuspid valve: There was mild regurgitation.  - Pulmonic valve: The valve appears to be grossly normal. There was  no significant regurgitation.  - Pulmonary arteries: PA peak pressure: 24 mm Hg (S).  - Inferior vena cava: The vessel was normal in size. The  respirophasic diameter changes were in the normal range (>= 50%),  consistent with normal central venous pressure.   Impressions:   - Compared to a prior study in 2014, there have been few changes.  LVEF 60-65%. The ascending aorta is mildly dilated at 3.9 cm.     EKG: Patient refused EKG.  Recent Labs: 03/31/2020: ALT 10; BUN 14; Creatinine, Ser 1.07; Potassium 3.8; Sodium 141; TSH 0.947  Recent Lipid Panel    Component Value Date/Time   CHOL 153 03/31/2020 0000   TRIG 126 03/31/2020 0000   HDL 44 03/31/2020 0000   CHOLHDL 3.5 03/31/2020 0000   CHOLHDL 4.0 06/27/2016 1428   VLDL 55 (H) 06/27/2016 1428   LDLCALC 86 03/31/2020 0000    PHYSICAL EXAM:    VS:  BP (!) 152/80   Pulse 67   Ht '5\' 6"'  (1.676 m)   Wt 221 lb 9.6 oz (100.5 kg)   SpO2 100%   BMI 35.77 kg/m   BMI: Body mass index is 35.77 kg/m.  GEN: Well nourished, well developed AAF in no acute distress HEENT: normocephalic, atraumatic Neck: no JVD, carotid bruits, or masses Cardiac: RRR; no murmurs, rubs, or gallops, no edema  Respiratory:  clear to auscultation bilaterally, normal work of breathing GI: soft, nontender, nondistended, + BS MS: no deformity or atrophy Skin: warm and dry, no rash Neuro:  Alert and Oriented x 3, Strength and sensation are intact, follows commands Psych: euthymic mood, full affect  Wt Readings from Last 3 Encounters:  05/03/20 221 lb 9.6 oz (100.5 kg)   04/04/20 222 lb 0.6 oz (100.7 kg)  01/21/20 (!) 223 lb (101.2 kg)     ASSESSMENT & PLAN:   1. Essential HTN - suboptimal blood pressure control  noted today. She's been on the cusp in the past too. Her HCTZ has been recalled so she requests alternative agent. Since she has been tolerating this well, we will switch to similar agent chlorthalidone at 70m daily. This generally has more potent blood pressure effect than HCTZ. Last potassium was 3.8 so we will have her take her 274m of KCl daily rather than PRN. Recheck BMET when she returns for her INR check next week. The patient was instructed to monitor their blood pressure at home and to call if tending to run higher than 130/80.  2. OSA - As above she is generally hesitant to be aggressive with her regimen due to her chronic fatigue. This may be due in part to OSA although she is very clear she does not wish to have any further workup for this. 3. Chronic diastolic CHF  - appears euvolemic. Have recommended blood pressure control as outlined above. Uses torsemide only sparingly. Suspect with chlorthalidone she will continue to do well from a fluid standpoint. 4. Mildly dilated ascending aorta - noted on echo in 2018. She does not wish to have repeat echocardiogram to trend at this time. We can revisit in the future if she changes her mind. 5. History of PE - per notes, previously maintained on lifelong anticoagulation as PE was felt unprovoked. INR is followed by our clinic. She denies any bleeding. Last CBC was >1 year ago so we will obtain when she returns for labs next week.  Disposition: F/u with Dr. NeMeda Coffeen 1 year.  Medication Adjustments/Labs and Tests Ordered: Current medicines are reviewed at length with the patient today.  Concerns regarding medicines are outlined above. Medication changes, Labs and Tests ordered today are summarized above and listed in the Patient Instructions accessible in Encounters.   Signed, DaCharlie PitterPA-C   05/03/2020 2:00 PM    [place]  [id number]: [phone]: [phone]

## 2020-05-03 ENCOUNTER — Other Ambulatory Visit: Payer: Self-pay

## 2020-05-03 ENCOUNTER — Encounter: Payer: Self-pay | Admitting: Physician Assistant

## 2020-05-03 ENCOUNTER — Ambulatory Visit (INDEPENDENT_AMBULATORY_CARE_PROVIDER_SITE_OTHER): Payer: Medicare Other | Admitting: Physician Assistant

## 2020-05-03 VITALS — BP 152/80 | HR 67 | Ht 66.0 in | Wt 221.6 lb

## 2020-05-03 DIAGNOSIS — Z86711 Personal history of pulmonary embolism: Secondary | ICD-10-CM

## 2020-05-03 DIAGNOSIS — I5032 Chronic diastolic (congestive) heart failure: Secondary | ICD-10-CM

## 2020-05-03 DIAGNOSIS — G4733 Obstructive sleep apnea (adult) (pediatric): Secondary | ICD-10-CM

## 2020-05-03 DIAGNOSIS — I10 Essential (primary) hypertension: Secondary | ICD-10-CM

## 2020-05-03 DIAGNOSIS — I77810 Thoracic aortic ectasia: Secondary | ICD-10-CM | POA: Diagnosis not present

## 2020-05-03 MED ORDER — CHLORTHALIDONE 25 MG PO TABS: 25.0000 mg | ORAL_TABLET | Freq: Every day | ORAL | 3 refills | Status: DC

## 2020-05-03 MED ORDER — POTASSIUM CHLORIDE CRYS ER 20 MEQ PO TBCR: 20.0000 meq | EXTENDED_RELEASE_TABLET | Freq: Every day | ORAL | 3 refills | Status: DC

## 2020-05-03 NOTE — Patient Instructions (Addendum)
Medication Instructions:  Your physician has recommended you make the following change in your medication:  1.  STOP Hydrochlorothiazide 2.  START Chlorthalidone 25 mg taking 1 daily  3.  INCREASE the Potassium to everyday   *If you need a refill on your cardiac medications before your next appointment, please call your pharmacy*   Lab Work: 05/09/20 When you come for coumadin:  GO TO THE LAB FOR BMET & CBC  If you have labs (blood work) drawn today and your tests are completely normal, you will receive your results only by: Marland Kitchen MyChart Message (if you have MyChart) OR . A paper copy in the mail If you have any lab test that is abnormal or we need to change your treatment, we will call you to review the results.   Testing/Procedures: None ordered   Follow-Up: At [facility], you and your health needs are our priority.  As part of our continuing mission to provide you with exceptional heart care, we have created designated Provider Care Teams.  These Care Teams include your primary Cardiologist (physician) and Advanced Practice Providers (APPs -  Physician Assistants and Nurse Practitioners) who all work together to provide you with the care you need, when you need it.  We recommend signing up for the patient portal called "MyChart".  Sign up information is provided on this After Visit Summary.  MyChart is used to connect with patients for Virtual Visits (Telemedicine).  Patients are able to view lab/test results, encounter notes, upcoming appointments, etc.  Non-urgent messages can be sent to your provider as well.   To learn more about what you can do with MyChart, go to [email or web address].    Your next appointment:   12 month(s)  The format for your next appointment:   In Person  Provider:   You may see Ena Dawley, MD or one of the following Advanced Practice Providers on your designated Care Team:    Melina Copa, PA-C  Ermalinda Barrios, PA-C    Other  Instructions  Please monitor your blood pressure occasionally at home. Call us if you tend to get readings of greater than 130 on the top number or 80 on the bottom number. I would recommend using a blood pressure cuff that goes on your arm. The wrist ones can be inaccurate. If possible, try to select one that also reports your heart rate. To check your blood pressure, choose a time at least 3 hours after taking your blood pressure medicines. If you can sample it at different times of the day, that's great - it might give you more information about how your blood pressure fluctuates. Remain seated in a chair for 5 minutes quietly beforehand, then check it.

## 2020-05-05 ENCOUNTER — Other Ambulatory Visit: Payer: Medicare Other

## 2020-05-09 ENCOUNTER — Other Ambulatory Visit: Payer: Medicare Other | Admitting: *Deleted

## 2020-05-09 ENCOUNTER — Other Ambulatory Visit: Payer: Self-pay

## 2020-05-09 ENCOUNTER — Ambulatory Visit (INDEPENDENT_AMBULATORY_CARE_PROVIDER_SITE_OTHER): Payer: Medicare Other | Admitting: *Deleted

## 2020-05-09 DIAGNOSIS — I5032 Chronic diastolic (congestive) heart failure: Secondary | ICD-10-CM | POA: Diagnosis not present

## 2020-05-09 DIAGNOSIS — Z86711 Personal history of pulmonary embolism: Secondary | ICD-10-CM | POA: Diagnosis not present

## 2020-05-09 DIAGNOSIS — Z5181 Encounter for therapeutic drug level monitoring: Secondary | ICD-10-CM | POA: Diagnosis not present

## 2020-05-09 DIAGNOSIS — I272 Pulmonary hypertension, unspecified: Secondary | ICD-10-CM

## 2020-05-09 DIAGNOSIS — I10 Essential (primary) hypertension: Secondary | ICD-10-CM

## 2020-05-09 DIAGNOSIS — I77810 Thoracic aortic ectasia: Secondary | ICD-10-CM | POA: Diagnosis not present

## 2020-05-09 LAB — BASIC METABOLIC PANEL
BUN/Creatinine Ratio: 15 (ref 12–28)
BUN: 13 mg/dL (ref 8–27)
CO2: 28 mmol/L (ref 20–29)
Calcium: 9.5 mg/dL (ref 8.7–10.3)
Chloride: 99 mmol/L (ref 96–106)
Creatinine, Ser: 0.86 mg/dL (ref 0.57–1.00)
GFR calc Af Amer: 78 mL/min/{1.73_m2} (ref >59)
GFR calc non Af Amer: 68 mL/min/{1.73_m2} (ref >59)
Glucose: 187 mg/dL — ABNORMAL HIGH (ref 65–99)
Potassium: 3.7 mmol/L (ref 3.5–5.2)
Sodium: 137 mmol/L (ref 134–144)

## 2020-05-09 LAB — POCT INR: INR: 2 (ref 2.0–3.0)

## 2020-05-09 NOTE — Patient Instructions (Signed)
Description   Continue on same dosage 1 tablet daily except 1/2 tablet on Tuesdays. Recheck INR in 4 weeks. Please call [phone] if you have any changes in your medications or up coming procedures.

## 2020-05-10 ENCOUNTER — Telehealth: Payer: Self-pay | Admitting: *Deleted

## 2020-05-10 MED ORDER — POTASSIUM CHLORIDE CRYS ER 20 MEQ PO TBCR: EXTENDED_RELEASE_TABLET | ORAL | 3 refills | Status: DC

## 2020-05-10 NOTE — Telephone Encounter (Signed)
-----   Message from Charlie Pitter, [place] sent at 05/10/2020  2:06 PM EST ----- Please let pt know labs are stable. How is blood pressure running and how is she doing on on chlorthalidone? Based on these results I would continue present regimen to include regularly scheduled daily potassium chloride 22meq, but on days she takes torsemide (she said this is rare) would have her take an extra 1 tablet of potassium on those days (in addition to the daily potassium dose). Dayna Dunn PA-C

## 2020-05-11 ENCOUNTER — Telehealth: Payer: Self-pay | Admitting: *Deleted

## 2020-05-11 DIAGNOSIS — Z86711 Personal history of pulmonary embolism: Secondary | ICD-10-CM | POA: Diagnosis not present

## 2020-05-11 DIAGNOSIS — I5032 Chronic diastolic (congestive) heart failure: Secondary | ICD-10-CM | POA: Diagnosis not present

## 2020-05-11 DIAGNOSIS — I77810 Thoracic aortic ectasia: Secondary | ICD-10-CM | POA: Diagnosis not present

## 2020-05-11 DIAGNOSIS — Z79899 Other long term (current) drug therapy: Secondary | ICD-10-CM

## 2020-05-11 DIAGNOSIS — I10 Essential (primary) hypertension: Secondary | ICD-10-CM | POA: Diagnosis not present

## 2020-05-11 LAB — CBC
Hematocrit: 40.4 % (ref 34.0–46.6)
Hemoglobin: 13.6 g/dL (ref 11.1–15.9)
MCH: 26.5 pg — ABNORMAL LOW (ref 26.6–33.0)
MCHC: 33.7 g/dL (ref 31.5–35.7)
MCV: 79 fL (ref 79–97)
Platelets: 379 10*3/uL (ref 150–450)
RBC: 5.13 x10E6/uL (ref 3.77–5.28)
RDW: 15.1 % (ref 11.7–15.4)
WBC: 9 10*3/uL (ref 3.4–10.8)

## 2020-05-11 NOTE — Telephone Encounter (Signed)
-----   Message from Charlie Pitter, [place] sent at 05/11/2020  3:54 PM EST ----- Please let pt know CBC stable. I saw the notation on the other BMET from 11/8 that she had just started her chlorthalidone the day prior. The intention was to have her do the BMET 1 week after starting the medicine like we talked about so we can assess if she needs additional potassium. Please make sure she also starts that daily KCl consistently as well. Can we please arrange?

## 2020-05-16 ENCOUNTER — Telehealth: Payer: Self-pay

## 2020-05-16 ENCOUNTER — Telehealth: Payer: Self-pay | Admitting: Physician Assistant

## 2020-05-16 ENCOUNTER — Other Ambulatory Visit: Payer: Medicare Other

## 2020-05-16 MED ORDER — POTASSIUM CHLORIDE CRYS ER 20 MEQ PO TBCR: EXTENDED_RELEASE_TABLET | ORAL | 3 refills | Status: DC

## 2020-05-16 NOTE — Telephone Encounter (Signed)
FYI  Patient called stating dr in [place] started her on chlorthalidone and she didn't like the side effects so she was not going to take that medicine and she had let that dr's office know. She also stated that the pharmacy had let her know there has been a recall on HCTZ because of cancer causing issues and she wasn't going to take that. She wanted to send a message to make changes to her medications. I advised patient she would need to be seen for any medication changes. She stated well I guess I will just take the olmesartan because i'm not coming in there just for medication changes.

## 2020-05-16 NOTE — Telephone Encounter (Signed)
Noted and agree with your advice

## 2020-05-16 NOTE — Telephone Encounter (Signed)
Call placed to pt.  Discussed Melina Copa, PA-C's recommendations below. Pt advised that she will not go back to the HCTZ and she will not continue with the Chlorthalidone. She stated that she will not take any type of medication that has a slight chance in effecting her kidneys and where she has to have blood work done. I explained to pt that "any" type of medication can effect your kidneys, just some needed closer following than others. She stated that she will discuss with her PCP and see if they can find something that helps bp and don't effect her kidney's and that she was grateful for the advice.

## 2020-05-16 NOTE — Addendum Note (Signed)
Addended by: Gaetano Net on: 05/16/2020 02:00 PM   Modules accepted: Orders

## 2020-05-16 NOTE — Telephone Encounter (Signed)
Is she willing to go back to her previous HCTZ 25mg  daily? Would see if her pharmacy has some in stock that were not specifically linked to the cancer ingredient recall. (This is why she wanted to change HCTZ to something else recently - also her blood pressure was not well controlled.) Based on her home BPs she does need additional blood pressure control so if we are moving back to the HCTZ would need to add trial of hydralazine 25mg  BID and arrange f/u in the HTN clinic ~1 week after starting.  If she decides to switch back from chlorthalidone to HCTZ, would also change her potassium chloride back to PRN only when she takes torsemide as her regimen was before.   Alternatively if she does not like this idea, we can give the chlorthalidone a little more time to get acclimated and f/u as bloodwork as planned.

## 2020-05-16 NOTE — Telephone Encounter (Signed)
Pt c/o medication issue:  1. Name of Medication: chlorthalidone (HYGROTON) 25 MG tablet  2. How are you currently taking this medication (dosage and times per day)? Last took 1 tablet by mouth yesterday.   3. Are you having a reaction (difficulty breathing--STAT)? Yes   4. What is your medication issue? Stephanie Singleton is calling stating she is no longer going to take this medication. She states she does not like the side effects specifically the excessive fatigue. Please advise.

## 2020-05-17 ENCOUNTER — Ambulatory Visit: Payer: Medicare Other

## 2020-06-08 ENCOUNTER — Other Ambulatory Visit: Payer: Self-pay | Admitting: Cardiology

## 2020-06-08 ENCOUNTER — Other Ambulatory Visit: Payer: Self-pay | Admitting: Family Medicine

## 2020-06-08 NOTE — Telephone Encounter (Signed)
Called pt since she is overdue for an appt. Left a message for the pt to call back.

## 2020-06-13 NOTE — Telephone Encounter (Signed)
Pt returned call to clinic, appt made for this Thursday 12/16. Confirmed pt has enough warfarin to last until this visit.

## 2020-06-16 ENCOUNTER — Other Ambulatory Visit: Payer: Self-pay

## 2020-06-16 ENCOUNTER — Ambulatory Visit (INDEPENDENT_AMBULATORY_CARE_PROVIDER_SITE_OTHER): Payer: Medicare Other | Admitting: *Deleted

## 2020-06-16 DIAGNOSIS — Z5181 Encounter for therapeutic drug level monitoring: Secondary | ICD-10-CM

## 2020-06-16 LAB — POCT INR: INR: 1.3 — AB (ref 2.0–3.0)

## 2020-06-16 NOTE — Patient Instructions (Addendum)
Description   Today and tomorrow take 1.5 tablets of Warfarin then continue taking Warfarin 1 tablet daily except 1/2 tablet on Tuesdays. Recheck INR in 1 week. Please call [phone] if you have any changes in your medications or up coming procedures.

## 2020-06-16 NOTE — Telephone Encounter (Signed)
Pt came to appt today. Will refill.

## 2020-06-22 ENCOUNTER — Ambulatory Visit (INDEPENDENT_AMBULATORY_CARE_PROVIDER_SITE_OTHER): Payer: Medicare Other | Admitting: Pharmacist

## 2020-06-22 ENCOUNTER — Other Ambulatory Visit: Payer: Self-pay

## 2020-06-22 DIAGNOSIS — Z5181 Encounter for therapeutic drug level monitoring: Secondary | ICD-10-CM

## 2020-06-22 LAB — POCT INR: INR: 2.2 (ref 2.0–3.0)

## 2020-06-22 NOTE — Patient Instructions (Signed)
Description   Continue taking Warfarin 1 tablet daily except 1/2 tablet on Tuesdays. Recheck INR in 6 weeks. Please call [phone] if you have any changes in your medications or up coming procedures.

## 2020-08-03 ENCOUNTER — Other Ambulatory Visit: Payer: Self-pay

## 2020-08-03 ENCOUNTER — Ambulatory Visit (INDEPENDENT_AMBULATORY_CARE_PROVIDER_SITE_OTHER): Payer: Medicare Other

## 2020-08-03 DIAGNOSIS — Z5181 Encounter for therapeutic drug level monitoring: Secondary | ICD-10-CM

## 2020-08-03 LAB — POCT INR: INR: 2.4 (ref 2.0–3.0)

## 2020-08-03 NOTE — Patient Instructions (Signed)
Description   Continue taking Warfarin 1 tablet daily except 1/2 tablet on Tuesdays. Recheck INR in 6 weeks per pt request. Please call [phone] if you have any changes in your medications or up coming procedures.

## 2020-08-17 ENCOUNTER — Other Ambulatory Visit: Payer: Self-pay

## 2020-08-17 DIAGNOSIS — E1121 Type 2 diabetes mellitus with diabetic nephropathy: Secondary | ICD-10-CM

## 2020-08-22 ENCOUNTER — Encounter: Payer: Medicare Other | Admitting: Family Medicine

## 2020-09-13 ENCOUNTER — Telehealth: Payer: Self-pay | Admitting: Cardiology

## 2020-09-13 ENCOUNTER — Ambulatory Visit (INDEPENDENT_AMBULATORY_CARE_PROVIDER_SITE_OTHER): Payer: Medicare Other

## 2020-09-13 ENCOUNTER — Other Ambulatory Visit: Payer: Self-pay

## 2020-09-13 DIAGNOSIS — Z5181 Encounter for therapeutic drug level monitoring: Secondary | ICD-10-CM | POA: Diagnosis not present

## 2020-09-13 LAB — POCT INR: INR: 3.4 — AB (ref 2.0–3.0)

## 2020-09-13 NOTE — Patient Instructions (Signed)
Description   Skip today's dosage of Warfarin, then resume same dosage 1 tablet daily except 1/2 tablet on Tuesdays. Recheck INR in 4 weeks. Please call [phone] if you have any changes in your medications or up coming procedures.

## 2020-09-15 ENCOUNTER — Other Ambulatory Visit: Payer: Self-pay | Admitting: Family Medicine

## 2020-09-18 ENCOUNTER — Other Ambulatory Visit: Payer: Self-pay | Admitting: Family Medicine

## 2020-09-22 ENCOUNTER — Other Ambulatory Visit: Payer: Self-pay | Admitting: Family Medicine

## 2020-09-29 ENCOUNTER — Encounter: Payer: Medicare Other | Admitting: Family Medicine

## 2020-10-13 ENCOUNTER — Other Ambulatory Visit: Payer: Self-pay | Admitting: Family Medicine

## 2020-10-15 ENCOUNTER — Other Ambulatory Visit: Payer: Self-pay | Admitting: Cardiology

## 2020-10-17 ENCOUNTER — Encounter: Payer: Medicare Other | Admitting: Family Medicine

## 2020-10-31 DIAGNOSIS — R0789 Other chest pain: Secondary | ICD-10-CM | POA: Diagnosis not present

## 2020-10-31 DIAGNOSIS — R519 Headache, unspecified: Secondary | ICD-10-CM | POA: Diagnosis not present

## 2020-10-31 DIAGNOSIS — J984 Other disorders of lung: Secondary | ICD-10-CM | POA: Diagnosis not present

## 2020-10-31 DIAGNOSIS — Z87891 Personal history of nicotine dependence: Secondary | ICD-10-CM | POA: Diagnosis not present

## 2020-10-31 DIAGNOSIS — R918 Other nonspecific abnormal finding of lung field: Secondary | ICD-10-CM | POA: Diagnosis not present

## 2020-10-31 DIAGNOSIS — R9431 Abnormal electrocardiogram [ECG] [EKG]: Secondary | ICD-10-CM | POA: Diagnosis not present

## 2020-10-31 DIAGNOSIS — G319 Degenerative disease of nervous system, unspecified: Secondary | ICD-10-CM | POA: Diagnosis not present

## 2020-10-31 DIAGNOSIS — Z882 Allergy status to sulfonamides status: Secondary | ICD-10-CM | POA: Diagnosis not present

## 2020-10-31 DIAGNOSIS — Z888 Allergy status to other drugs, medicaments and biological substances status: Secondary | ICD-10-CM | POA: Diagnosis not present

## 2020-10-31 DIAGNOSIS — R0989 Other specified symptoms and signs involving the circulatory and respiratory systems: Secondary | ICD-10-CM | POA: Diagnosis not present

## 2020-10-31 DIAGNOSIS — K219 Gastro-esophageal reflux disease without esophagitis: Secondary | ICD-10-CM | POA: Diagnosis not present

## 2020-10-31 DIAGNOSIS — I10 Essential (primary) hypertension: Secondary | ICD-10-CM | POA: Diagnosis not present

## 2020-10-31 DIAGNOSIS — I169 Hypertensive crisis, unspecified: Secondary | ICD-10-CM | POA: Diagnosis not present

## 2020-10-31 DIAGNOSIS — R9082 White matter disease, unspecified: Secondary | ICD-10-CM | POA: Diagnosis not present

## 2020-10-31 DIAGNOSIS — E119 Type 2 diabetes mellitus without complications: Secondary | ICD-10-CM | POA: Diagnosis not present

## 2020-11-02 ENCOUNTER — Ambulatory Visit (INDEPENDENT_AMBULATORY_CARE_PROVIDER_SITE_OTHER): Payer: Medicare Other | Admitting: Nurse Practitioner

## 2020-11-02 ENCOUNTER — Encounter: Payer: Self-pay | Admitting: Nurse Practitioner

## 2020-11-02 ENCOUNTER — Other Ambulatory Visit: Payer: Self-pay

## 2020-11-02 DIAGNOSIS — I10 Essential (primary) hypertension: Secondary | ICD-10-CM | POA: Diagnosis not present

## 2020-11-02 DIAGNOSIS — W5501XA Bitten by cat, initial encounter: Secondary | ICD-10-CM | POA: Insufficient documentation

## 2020-11-02 MED ORDER — FLUCONAZOLE 150 MG PO TABS: ORAL_TABLET | ORAL | 0 refills | Status: DC

## 2020-11-02 MED ORDER — DOXYCYCLINE HYCLATE 100 MG PO TABS
100.0000 mg | ORAL_TABLET | Freq: Two times a day (BID) | ORAL | 0 refills | Status: DC
Start: 2020-11-02 — End: 2020-12-20

## 2020-11-02 NOTE — Assessment & Plan Note (Signed)
-  had hypertensive crisis, but left AMA despite recommendation to be admitted and monitored -BP has improved and is 123/89 today per home monitor -continue current meds -we discussed that we will need to see her in office to get our own BP readings and to check her thumb wound -unsure of etiology of her BP issues

## 2020-11-02 NOTE — Assessment & Plan Note (Signed)
-  Left thumb is red and swollen -Rx. Doxycycline -Rx. Diflucan to prevent yeast infection

## 2020-11-02 NOTE — Progress Notes (Signed)
Acute Office Visit  Subjective:    Patient ID: Stephanie Singleton, female    DOB: August 12, 1945, 75 y.o.   MRN: [id number]  Chief Complaint  Patient presents with  . Hypertension    Blood pressure spiked to 217/129 and pt went to ER 10/31/2020. Blood pressure has been "okay" since she left the hospital. Was advised to follow up.     HPI Patient is in today for ED follow-up. She was seen at [facility] ED in [place] for hypertensive crisis. She was recommended to be admitted to the hospital but left AMA.  Her BP prior to ED visit was 178/129 and she was having nosebleeds.  In the ED, her BP was 210/100.   Since leaving the hospital, her BP has improved. Now her BP is 123/89 according to her home monitor.  Her throbbing headache and nosebleeds have resolved.    Past Medical History:  Diagnosis Date  . Chronic diastolic CHF (congestive heart failure) ([place])   . CKD (chronic kidney disease), stage II   . Diabetes mellitus DX X 1 YR   USE METFORMIN ACCORDING TO CBG RESULT  . Dilatation of aorta (HCC)   . Embolism - blood clot 2004   Pulmonary .on coumadin  . GERD (gastroesophageal reflux disease)    ON PREVACID  . HTN (hypertension)    EKG 10/23 IN EPIC  . Hyperlipidemia   . 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 OSA (obstructive sleep apnea) DX 8 YRS AGO.DOES NOT WEAR MASK  . Pulmonary embolism ([place])    a. 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  . Tumor of soft tissue of neck     Past Surgical History:  Procedure Laterality Date  . CERVICAL FUSION      Family History  Problem Relation Age of Onset  . Heart disease Mother   . Hypertension Mother   . Stroke Mother   . Diabetes Father   . Peripheral vascular disease Father   . Peripheral vascular disease Brother   . Heart disease Brother   . Bladder Cancer Sister 75  . Diabetes Daughter        3 of the 5 daughters  . Diabetes Son        3 of the sons  . Breast  cancer Sister 104  . Liver disease Son   . Colon cancer Neg Hx   . Pancreatic cancer Neg Hx   . Stomach cancer Neg Hx   . Esophageal cancer Neg Hx     Social History   Socioeconomic History  . Marital status: Divorced    Spouse name: Not on file  . Number of children: 49  . Years of education: Not on file  . Highest education level: Some college, no degree  Occupational History    Employer: DISABLED     Comment: [employer]  Tobacco Use  . Smoking status: Former Smoker    Packs/day: 0.25    Years: 20.00    Pack years: 5.00    Quit date: 08/07/1996    Years since quitting: 24.2  . Smokeless tobacco: Never Used  Substance and Sexual Activity  . Alcohol use: No    Comment: upper limits of week, 1-2 daily- until 07/2018, no longer having alcohol per patient  . Drug use: No  . Sexual activity: Yes  Other Topics Concern  . Not on file  Social History Narrative  . Not on  file   Social Determinants of Health   Financial Resource Strain: Low Risk   . Difficulty of Paying Living Expenses: Not hard at all  Food Insecurity: No Food Insecurity  . Worried About Charity fundraiser in the Last Year: Never true  . Ran Out of Food in the Last Year: Never true  Transportation Needs: No Transportation Needs  . Lack of Transportation (Medical): No  . Lack of Transportation (Non-Medical): No  Physical Activity: Inactive  . Days of Exercise per Week: 0 days  . Minutes of Exercise per Session: 0 min  Stress: No Stress Concern Present  . Feeling of Stress : Only a little  Social Connections: Moderately Isolated  . Frequency of Communication with Friends and Family: More than three times a week  . Frequency of Social Gatherings with Friends and Family: More than three times a week  . Attends Religious Services: Never  . Active Member of Clubs or Organizations: No  . Attends Archivist Meetings: Never  . Marital Status: Married  Human resources officer Violence: Not At Risk  . Fear of  Current or Ex-Partner: No  . Emotionally Abused: No  . Physically Abused: No  . Sexually Abused: No    Outpatient Medications Prior to Visit  Medication Sig Dispense Refill  . ACCU-CHEK AVIVA PLUS test strip USE TO TEST ONCE DAILY 100 strip 3  . Accu-Chek FastClix Lancets MISC USE 1 UNIT 2 (TWO) TIMES A DAY. 102 each 5  . gabapentin (NEURONTIN) 300 MG capsule Take 1 capsule (300 mg total) by mouth 3 (three) times daily. 90 capsule 4  . glipiZIDE (GLUCOTROL XL) 2.5 MG 24 hr tablet TAKE 1 TABLET (2.5 MG TOTAL) BY MOUTH DAILY WITH BREAKFAST. 90 tablet 3  . hydrochlorothiazide (MICROZIDE) 12.5 MG capsule Take 12.5 mg by mouth daily.    . hydrOXYzine (ATARAX/VISTARIL) 50 MG tablet TAKE 1 TABLET BY MOUTH EVERYDAY AT BEDTIME 90 tablet 1  . olmesartan (BENICAR) 40 MG tablet TAKE 1 TABLET BY MOUTH EVERY DAY 90 tablet 3  . pantoprazole (PROTONIX) 40 MG tablet TAKE 1 TABLET BY MOUTH EVERY DAY 90 tablet 1  . potassium chloride SA (KLOR-CON M20) 20 MEQ tablet Take 1 tablet by mouth daily as needed, only on days you take torsemide 30 tablet 3  . pravastatin (PRAVACHOL) 20 MG tablet TAKE 1 TABLET BY MOUTH EVERY DAY 90 tablet 1  . torsemide (DEMADEX) 20 MG tablet TAKE 1 TABLET 2 TIMES A WEEK AS NEEDED FOR SWELLING. Please make overdue appt with Dr. Meda Coffee before anymore refills. 1st attempt 8 tablet 0  . TRADJENTA 5 MG TABS tablet TAKE 1 TABLET BY MOUTH EVERY DAY 90 tablet 0  . warfarin (COUMADIN) 2.5 MG tablet TAKE 1 TABLET BY MOUTH DAILY OR AS DIRECTED BY COUMADIN CLINIC 90 tablet 0   No facility-administered medications prior to visit.    Allergies  Allergen Reactions  . Aspirin Nausea And Vomiting and Nausea Only  . Lisinopril Other (See Comments)    Other reaction(s): Cough Unknown reaction    Review of Systems  Constitutional: Negative.   Respiratory: Negative.   Cardiovascular: Negative.   Psychiatric/Behavioral: Negative.        Objective:    Physical Exam  BP 112/77  Wt  Readings from Last 3 Encounters:  05/03/20 221 lb 9.6 oz (100.5 kg)  04/04/20 222 lb 0.6 oz (100.7 kg)  01/21/20 (!) 223 lb (101.2 kg)    Health Maintenance Due  Topic Date Due  .  COVID-19 Vaccine (1) Never done  . PNA vac Low Risk Adult (1 of 2 - PCV13) Never done  . TETANUS/TDAP  01/04/2015  . MAMMOGRAM  04/26/2019  . FOOT EXAM  04/12/2020  . OPHTHALMOLOGY EXAM  06/15/2020  . HEMOGLOBIN A1C  09/28/2020    There are no preventive care reminders to display for this patient.   Lab Results  Component Value Date   TSH 0.947 03/31/2020   Lab Results  Component Value Date   WBC 9.0 05/11/2020   HGB 13.6 05/11/2020   HCT 40.4 05/11/2020   MCV 79 05/11/2020   PLT 379 05/11/2020   Lab Results  Component Value Date   NA 137 05/09/2020   K 3.7 05/09/2020   CO2 28 05/09/2020   GLUCOSE 187 (H) 05/09/2020   BUN 13 05/09/2020   CREATININE 0.86 05/09/2020   BILITOT 0.4 03/31/2020   ALKPHOS 111 03/31/2020   AST 12 03/31/2020   ALT 10 03/31/2020   PROT 7.1 03/31/2020   ALBUMIN 4.1 03/31/2020   CALCIUM 9.5 05/09/2020   Lab Results  Component Value Date   CHOL 153 03/31/2020   Lab Results  Component Value Date   HDL 44 03/31/2020   Lab Results  Component Value Date   LDLCALC 86 03/31/2020   Lab Results  Component Value Date   TRIG 126 03/31/2020   Lab Results  Component Value Date   CHOLHDL 3.5 03/31/2020   Lab Results  Component Value Date   HGBA1C 7.1 (H) 03/31/2020       Assessment & Plan:   Problem List Items Addressed This Visit      Cardiovascular and Mediastinum   Hypertension goal BP (blood pressure) < 130/80    -had hypertensive crisis, but left AMA despite recommendation to be admitted and monitored -BP has improved and is 123/89 today per home monitor -continue current meds -we discussed that we will need to see her in office to get our own BP readings and to check her thumb wound -unsure of etiology of her BP issues      Relevant  Medications   hydrochlorothiazide (MICROZIDE) 12.5 MG capsule     Other   Cat bite    -Left thumb is red and swollen -Rx. Doxycycline -Rx. Diflucan to prevent yeast infection          Meds ordered this encounter  Medications  . doxycycline (VIBRA-TABS) 100 MG tablet    Sig: Take 1 tablet (100 mg total) by mouth 2 (two) times daily.    Dispense:  20 tablet    Refill:  0  . fluconazole (DIFLUCAN) 150 MG tablet    Sig: Take 1 tablet PO now. Repeat dose in 72 hours.    Dispense:  2 tablet    Refill:  0   Date:  11/02/2020   Location of Patient: Home Location of Provider: Office Consent was obtain for visit to be over via telehealth. I verified that I am speaking with the correct person using two identifiers.  I connected with  Angela Jeancharles on 11/02/20 via telephone and verified that I am speaking with the correct person using two identifiers.   I discussed the limitations of evaluation and management by telemedicine. The patient expressed understanding and agreed to proceed.  Time spent: [place], NP

## 2020-11-07 ENCOUNTER — Encounter: Payer: Medicare Other | Admitting: Family Medicine

## 2020-11-10 ENCOUNTER — Other Ambulatory Visit: Payer: Self-pay | Admitting: Family Medicine

## 2020-11-15 ENCOUNTER — Other Ambulatory Visit: Payer: Self-pay

## 2020-11-15 ENCOUNTER — Ambulatory Visit (INDEPENDENT_AMBULATORY_CARE_PROVIDER_SITE_OTHER): Payer: Medicare Other | Admitting: *Deleted

## 2020-11-15 DIAGNOSIS — Z5181 Encounter for therapeutic drug level monitoring: Secondary | ICD-10-CM

## 2020-11-15 LAB — POCT INR: INR: 5.4 — AB (ref 2.0–3.0)

## 2020-11-15 NOTE — Patient Instructions (Signed)
Description   -Hold warfarin 5/18, 5/19 and 5/20.  -Then START taking warfarin 1 tablet daily except for 1/2 a tablet on Tuesday and Thursdays. -recheck INR in 1 week.  -[facility] [phone].

## 2020-11-25 ENCOUNTER — Ambulatory Visit (INDEPENDENT_AMBULATORY_CARE_PROVIDER_SITE_OTHER): Payer: Medicare Other

## 2020-11-25 ENCOUNTER — Other Ambulatory Visit: Payer: Self-pay

## 2020-11-25 DIAGNOSIS — Z5181 Encounter for therapeutic drug level monitoring: Secondary | ICD-10-CM | POA: Diagnosis not present

## 2020-11-25 LAB — POCT INR: INR: 2.1 (ref 2.0–3.0)

## 2020-11-25 NOTE — Patient Instructions (Signed)
Description   Continue on same dosage of Warfarin 1 tablet daily except for 1/2 a tablet on Tuesdays and Thursdays. Recheck INR in 3 weeks. [facility] [phone].

## 2020-12-05 ENCOUNTER — Other Ambulatory Visit: Payer: Self-pay | Admitting: Family Medicine

## 2020-12-16 ENCOUNTER — Ambulatory Visit (INDEPENDENT_AMBULATORY_CARE_PROVIDER_SITE_OTHER): Payer: Medicare Other | Admitting: Pharmacist

## 2020-12-16 ENCOUNTER — Other Ambulatory Visit: Payer: Self-pay

## 2020-12-16 ENCOUNTER — Other Ambulatory Visit: Payer: Medicare Other | Admitting: *Deleted

## 2020-12-16 DIAGNOSIS — I272 Pulmonary hypertension, unspecified: Secondary | ICD-10-CM

## 2020-12-16 DIAGNOSIS — Z79899 Other long term (current) drug therapy: Secondary | ICD-10-CM | POA: Diagnosis not present

## 2020-12-16 LAB — POCT INR: INR: 2 (ref 2.0–3.0)

## 2020-12-16 NOTE — Patient Instructions (Signed)
Description   Continue on same dosage of Warfarin 1 tablet daily except for 1/2 a tablet on Tuesdays and Thursdays. Recheck INR in 4 weeks. [facility] [phone].

## 2020-12-17 LAB — BASIC METABOLIC PANEL
BUN/Creatinine Ratio: 14 (ref 12–28)
BUN: 17 mg/dL (ref 8–27)
CO2: 23 mmol/L (ref 20–29)
Calcium: 9.5 mg/dL (ref 8.7–10.3)
Chloride: 98 mmol/L (ref 96–106)
Creatinine, Ser: 1.19 mg/dL — ABNORMAL HIGH (ref 0.57–1.00)
Glucose: 194 mg/dL — ABNORMAL HIGH (ref 65–99)
Potassium: 3.9 mmol/L (ref 3.5–5.2)
Sodium: 141 mmol/L (ref 134–144)
eGFR: 49 mL/min/{1.73_m2} — ABNORMAL LOW (ref >59)

## 2020-12-20 ENCOUNTER — Other Ambulatory Visit: Payer: Self-pay

## 2020-12-20 ENCOUNTER — Encounter: Payer: Self-pay | Admitting: Family Medicine

## 2020-12-20 ENCOUNTER — Ambulatory Visit (INDEPENDENT_AMBULATORY_CARE_PROVIDER_SITE_OTHER): Payer: Medicare Other | Admitting: Family Medicine

## 2020-12-20 VITALS — BP 133/81 | HR 82 | Resp 16 | Ht 66.0 in | Wt 219.0 lb

## 2020-12-20 DIAGNOSIS — E559 Vitamin D deficiency, unspecified: Secondary | ICD-10-CM

## 2020-12-20 DIAGNOSIS — D500 Iron deficiency anemia secondary to blood loss (chronic): Secondary | ICD-10-CM

## 2020-12-20 DIAGNOSIS — E1121 Type 2 diabetes mellitus with diabetic nephropathy: Secondary | ICD-10-CM | POA: Diagnosis not present

## 2020-12-20 DIAGNOSIS — Z0000 Encounter for general adult medical examination without abnormal findings: Secondary | ICD-10-CM

## 2020-12-20 DIAGNOSIS — R5383 Other fatigue: Secondary | ICD-10-CM | POA: Diagnosis not present

## 2020-12-20 DIAGNOSIS — E785 Hyperlipidemia, unspecified: Secondary | ICD-10-CM

## 2020-12-20 DIAGNOSIS — I10 Essential (primary) hypertension: Secondary | ICD-10-CM

## 2020-12-20 LAB — POCT GLYCOSYLATED HEMOGLOBIN (HGB A1C): HbA1c, POC (controlled diabetic range): 7.5 % — AB (ref 0.0–7.0)

## 2020-12-20 MED ORDER — GLIPIZIDE ER 5 MG PO TB24: 5.0000 mg | ORAL_TABLET | Freq: Every day | ORAL | 2 refills | Status: DC

## 2020-12-20 NOTE — Assessment & Plan Note (Signed)
Increase glipizide to 5 mfg daily, continue tradjenta Stephanie Singleton is reminded of the importance of commitment to daily physical activity for 30 minutes or more, as able and the need to limit carbohydrate intake to 30 to 60 grams per meal to help with blood sugar control.   The need to take medication as prescribed, test blood sugar as directed, and to call between visits if there is a concern that blood sugar is uncontrolled is also discussed.   Stephanie Singleton is reminded of the importance of daily foot exam, annual eye examination, and good blood sugar, blood pressure and cholesterol control.  Diabetic Labs Latest Ref Rng & Units 12/20/2020 12/16/2020 05/09/2020 04/05/2020 03/31/2020  HbA1c 0.0 - 7.0 % 7.5(A) - - - 7.1(H)  Microalbumin Not Estab. ug/mL - - - 9.3(H) -  Micro/Creat Ratio <30 mcg/mg creat - - - - -  Chol 100 - 199 mg/dL - - - - 153  HDL >39 mg/dL - - - - 44  Calc LDL 0 - 99 mg/dL - - - - 86  Triglycerides 0 - 149 mg/dL - - - - 126  Creatinine 0.57 - 1.00 mg/dL - 1.19(H) 0.86 - 1.07(H)   BP/Weight 12/20/2020 11/02/2020 05/03/2020 04/04/2020 01/21/2020 09/02/2019 41/63/8453  Systolic BP 646 803 212 248 250 037 048  Diastolic BP 81 77 80 82 94 94 94  Wt. (Lbs) 219 - 221.6 222.04 223 223 223  BMI 35.35 - 35.77 35.84 35.99 35.99 35.99   Foot/eye exam completion dates Latest Ref Rng & Units 06/15/2019 06/15/2019  Eye Exam No Retinopathy Retinopathy(A) Retinopathy(A)  Foot Form Completion - - -

## 2020-12-20 NOTE — Assessment & Plan Note (Signed)

## 2020-12-20 NOTE — Patient Instructions (Addendum)
F/u in 4 months, call if you need me before  Please get CBC, fasting lipid, cmp and eGFr, HBA1C, TSH and vit D 1 week before next visit   Increase glipizide dose to 5 mg daily, continue tradjenta one daily as before  You are referred to cardiology due to new exertional fatigue  Reconsider vaccines  Please get mammogram and eye exam as promised, both are overdue  Good foot exam  Thanks for choosing [facility], we consider it a privelige to serve you.

## 2020-12-23 ENCOUNTER — Encounter: Payer: Self-pay | Admitting: Family Medicine

## 2020-12-23 DIAGNOSIS — R5383 Other fatigue: Secondary | ICD-10-CM | POA: Insufficient documentation

## 2020-12-23 NOTE — Assessment & Plan Note (Signed)
High risk for cVD, reports recent increase in exertional fatigue and poor exercise tolerance. EKG; no acute ischemia or lVH, nSR. Refer for cardiologyeval

## 2020-12-23 NOTE — Progress Notes (Signed)
Stephanie Singleton     MRN: [id number]      DOB: [birth year]  HPI: Patient is in for annual physical exam. C/o poor exercise tolerance and exertional fatigue , worsening over past 4 to 6 weeks Immunization is reviewed , and  she still declines vaccines   PE: Pleasant  female, alert and oriented x 3, in no cardio-pulmonary distress. Afebrile. HEENT No facial trauma or asymetry. Sinuses non tender.  Extra occullar muscles intact.. External ears normal, . Neck: supple, no adenopathy,JVD or thyromegaly.No bruits.  Chest: Clear to ascultation bilaterally.No crackles or wheezes. Non tender to palpation   Cardiovascular system; Heart sounds normal,  S1 and  S2 ,no S3.  No murmur, or thrill. Apical beat not displaced Peripheral pulses normal.  Abdomen: Soft, non tender, no organomegaly or masses. No bruits. Bowel sounds normal. No guarding, tenderness or rebound.   .   Musculoskeletal exam: Decreased though adequate ROM of spine, hips , shoulders and knees. No deformity ,swelling or crepitus noted. No muscle wasting or atrophy.   Neurologic: Cranial nerves 2 to 12 intact. Power, tone ,sensation and reflexes normal throughout. No disturbance in gait. No tremor.  Skin: Intact, no ulceration, erythema , scaling or rash noted. Pigmentation normal throughout  Psych; Normal mood and affect. Judgement and concentration normal   Assessment & Plan:  Annual physical exam Annual exam as documented. Counseling done  re healthy lifestyle involving commitment to 150 minutes exercise per week, heart healthy diet, and attaining healthy weight.The importance of adequate sleep also discussed. Regular seat belt use and home safety, is also discussed. Changes in health habits are decided on by the patient with goals and time frames  set for achieving them. Immunization and cancer screening needs are specifically addressed at this visit.   Type 2 diabetes with nephropathy  (HCC) Increase glipizide to 5 mfg daily, continue tradjenta Stephanie Singleton is reminded of the importance of commitment to daily physical activity for 30 minutes or more, as able and the need to limit carbohydrate intake to 30 to 60 grams per meal to help with blood sugar control.   The need to take medication as prescribed, test blood sugar as directed, and to call between visits if there is a concern that blood sugar is uncontrolled is also discussed.   Stephanie Singleton is reminded of the importance of daily foot exam, annual eye examination, and good blood sugar, blood pressure and cholesterol control.  Diabetic Labs Latest Ref Rng & Units 12/20/2020 12/16/2020 05/09/2020 04/05/2020 03/31/2020  HbA1c 0.0 - 7.0 % 7.5(A) - - - 7.1(H)  Microalbumin Not Estab. ug/mL - - - 9.3(H) -  Micro/Creat Ratio <30 mcg/mg creat - - - - -  Chol 100 - 199 mg/dL - - - - 153  HDL >39 mg/dL - - - - 44  Calc LDL 0 - 99 mg/dL - - - - 86  Triglycerides 0 - 149 mg/dL - - - - 126  Creatinine 0.57 - 1.00 mg/dL - 1.19(H) 0.86 - 1.07(H)   BP/Weight 12/20/2020 11/02/2020 05/03/2020 04/04/2020 01/21/2020 09/02/2019 65/08/5463  Systolic BP 681 275 170 017 494 496 759  Diastolic BP 81 77 80 82 94 94 94  Wt. (Lbs) 219 - 221.6 222.04 223 223 223  BMI 35.35 - 35.77 35.84 35.99 35.99 35.99   Foot/eye exam completion dates Latest Ref Rng & Units 06/15/2019 06/15/2019  Eye Exam No Retinopathy Retinopathy(A) Retinopathy(A)  Foot Form Completion - - -  Fatigue High risk for cVD, reports recent increase in exertional fatigue and poor exercise tolerance. EKG; no acute ischemia or lVH, nSR. Refer for cardiologyeval

## 2020-12-27 ENCOUNTER — Ambulatory Visit: Payer: Medicare Other

## 2021-01-09 ENCOUNTER — Other Ambulatory Visit: Payer: Self-pay | Admitting: *Deleted

## 2021-01-09 MED ORDER — WARFARIN SODIUM 2.5 MG PO TABS: ORAL_TABLET | ORAL | 0 refills | Status: DC

## 2021-01-17 ENCOUNTER — Other Ambulatory Visit: Payer: Self-pay | Admitting: Family Medicine

## 2021-01-18 ENCOUNTER — Other Ambulatory Visit: Payer: Self-pay

## 2021-01-18 ENCOUNTER — Ambulatory Visit (INDEPENDENT_AMBULATORY_CARE_PROVIDER_SITE_OTHER): Payer: Medicare Other | Admitting: *Deleted

## 2021-01-18 DIAGNOSIS — Z5181 Encounter for therapeutic drug level monitoring: Secondary | ICD-10-CM | POA: Diagnosis not present

## 2021-01-18 LAB — POCT INR: INR: 2.9 (ref 2.0–3.0)

## 2021-01-18 NOTE — Patient Instructions (Signed)
Description   Continue taking Warfarin 1 tablet daily except for 1/2 tablet on Tuesdays and Thursdays. Recheck INR in 5 weeks. [facility] [phone].

## 2021-01-24 ENCOUNTER — Other Ambulatory Visit: Payer: Self-pay

## 2021-01-24 ENCOUNTER — Ambulatory Visit (INDEPENDENT_AMBULATORY_CARE_PROVIDER_SITE_OTHER): Payer: Medicare Other | Admitting: *Deleted

## 2021-01-24 DIAGNOSIS — Z0000 Encounter for general adult medical examination without abnormal findings: Secondary | ICD-10-CM | POA: Diagnosis not present

## 2021-01-24 NOTE — Progress Notes (Signed)
Subjective:   Stephanie Singleton is a 75 y.o. female who presents for Medicare Annual (Subsequent) preventive examination.  Review of Systems           Objective:    There were no vitals filed for this visit. There is no height or weight on file to calculate BMI.  Advanced Directives 01/15/2018 08/07/2016 10/31/2014 09/07/2014 05/01/2011  Does Patient Have a Medical Advance Directive? No No No No Patient does not have advance directive;Patient would not like information  Would patient like information on creating a medical advance directive? No - Patient declined No - Patient declined No - patient declined information Yes - Educational materials given -    Current Medications (verified) Outpatient Encounter Medications as of 01/24/2021  Medication Sig   ACCU-CHEK AVIVA PLUS test strip USE TO TEST ONCE DAILY   Accu-Chek FastClix Lancets MISC USE 1 UNIT 2 (TWO) TIMES A DAY.   gabapentin (NEURONTIN) 300 MG capsule Take 1 capsule (300 mg total) by mouth 3 (three) times daily.   glipiZIDE (GLUCOTROL XL) 5 MG 24 hr tablet Take 1 tablet (5 mg total) by mouth daily with breakfast.   hydrochlorothiazide (HYDRODIURIL) 25 MG tablet TAKE 1 TABLET BY MOUTH EVERY DAY   hydrochlorothiazide (MICROZIDE) 12.5 MG capsule Take 12.5 mg by mouth daily.   hydrOXYzine (ATARAX/VISTARIL) 50 MG tablet TAKE 1 TABLET BY MOUTH EVERYDAY AT BEDTIME   olmesartan (BENICAR) 40 MG tablet TAKE 1 TABLET BY MOUTH EVERY DAY   pantoprazole (PROTONIX) 40 MG tablet TAKE 1 TABLET BY MOUTH EVERY DAY   potassium chloride SA (KLOR-CON M20) 20 MEQ tablet Take 1 tablet by mouth daily as needed, only on days you take torsemide   pravastatin (PRAVACHOL) 20 MG tablet TAKE 1 TABLET BY MOUTH EVERY DAY   torsemide (DEMADEX) 20 MG tablet TAKE 1 TABLET 2 TIMES A WEEK AS NEEDED FOR SWELLING. Please make overdue appt with Dr. Meda Coffee before anymore refills. 1st attempt   TRADJENTA 5 MG TABS tablet TAKE 1 TABLET BY MOUTH EVERY DAY   warfarin  (COUMADIN) 2.5 MG tablet TAKE 1 TABLET BY MOUTH DAILY OR AS DIRECTED BY COUMADIN CLINIC   No facility-administered encounter medications on file as of 01/24/2021.    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   Tumor of soft tissue of neck    Past Surgical History:  Procedure Laterality Date   CERVICAL FUSION     Family History  Problem Relation Age of Onset   Heart disease Mother    Hypertension Mother    Stroke Mother    Diabetes Father    Peripheral vascular disease Father    Peripheral vascular disease Brother    Heart disease Brother  Bladder Cancer Sister 39   Diabetes Daughter        3 of the 5 daughters   Diabetes Son        3 of the sons   Breast cancer Sister 52   Liver disease Son    Colon cancer Neg Hx    Pancreatic cancer Neg Hx    Stomach cancer Neg Hx    Esophageal cancer Neg Hx    Social History   Socioeconomic History   Marital status: Divorced    Spouse name: Not on file   Number of children: 64   Years of education: Not on file   Highest education level: Some college, no degree  Occupational History    Employer: DISABLED     Comment: [employer]  Tobacco Use   Smoking status: Former    Packs/day: 0.25    Years: 20.00    Pack years: 5.00    Types: Cigarettes    Quit date: 08/07/1996     Years since quitting: 24.4   Smokeless tobacco: Never  Substance and Sexual Activity   Alcohol use: No    Comment: upper limits of week, 1-2 daily- until 07/2018, no longer having alcohol per patient   Drug use: No   Sexual activity: Yes  Other Topics Concern   Not on file  Social History Narrative   Not on file   Social Determinants of Health   Financial Resource Strain: Not on file  Food Insecurity: Not on file  Transportation Needs: Not on file  Physical Activity: Not on file  Stress: Not on file  Social Connections: Not on file    Tobacco Counseling Counseling given: Not Answered   Clinical Intake:                 Diabetic?Nutrition Risk Assessment:  Has the patient had any N/V/D within the last 2 months?  No  Does the patient have any non-healing wounds?  No  Has the patient had any unintentional weight loss or weight gain?  No   Diabetes:  Is the patient diabetic?  Yes  If diabetic, was a CBG obtained today?  No  Did the patient bring in their glucometer from home?  No  How often do you monitor your CBG's? Everyday .   Financial Strains and Diabetes Management:  Are you having any financial strains with the device, your supplies or your medication? No .  Does the patient want to be seen by Chronic Care Management for management of their diabetes?  No  Would the patient like to be referred to a Nutritionist or for Diabetic Management?  No   Diabetic Exams:  Diabetic Eye Exam: Completed 02-23-21. Overdue for diabetic eye exam. Pt has been advised about the importance in completing this exam. A referral has been placed today. Message sent to referral coordinator for scheduling purposes. Advised pt to expect a call from office referred to regarding appt.  Diabetic Foot Exam: Completed 12-23-20. Pt has been advised about the importance in completing this exam. Pt is scheduled for diabetic foot exam on 12-23-21.           Activities of Daily Living No  flowsheet data found.  Patient Care Team: Fayrene Helper, MD as PCP - General Meda Coffee Jamse Belfast, MD (Inactive) as PCP - Cardiology (Cardiology) Rothbart, Cristopher Estimable, MD (Inactive) (Cardiology) Trula Slade, DPM as Consulting Physician (Podiatry) Ashok Pall, MD as Consulting Physician (Neurosurgery)  Indicate any recent Medical Services you [phone]  have received from other than [facility] providers in the past year (date may be approximate).     Assessment:   This is a routine wellness examination for Stephanie Singleton.  Hearing/Vision screen No results found.  Dietary issues and exercise activities discussed:     Goals Addressed   None   Depression Screen PHQ 2/9 Scores 12/20/2020 11/02/2020 04/04/2020 01/21/2020 01/20/2019 11/18/2018 07/21/2018  PHQ - 2 Score 0 0 0 0 0 0 0  PHQ- 9 Score - - - 0 - - -    Fall Risk Fall Risk  12/20/2020 11/02/2020 04/04/2020 01/21/2020 09/02/2019  Falls in the past year? 0 0 0 0 0  Number falls in past yr: 0 0 - 0 0  Injury with Fall? 0 0 - 0 0  Risk for fall due to : - No Fall Risks - No Fall Risks -  Follow up - Falls evaluation completed - Falls evaluation completed -    FALL RISK PREVENTION PERTAINING TO THE HOME:  Any stairs in or around the home? No  If so, are there any without handrails? No  Home free of loose throw rugs in walkways, pet beds, electrical cords, etc? Yes  Adequate lighting in your home to reduce risk of falls? Yes   ASSISTIVE DEVICES UTILIZED TO PREVENT FALLS:  Life alert? No  Use of a cane, walker or w/c? No  Grab bars in the bathroom? Yes  Shower chair or bench in shower? No  Elevated toilet seat or a handicapped toilet? Yes   TIMED UP AND GO:  Was the test performed? No .  Length of time to ambulate 10 feet: NA  sec.     Cognitive Function:     6CIT Screen 01/21/2020 01/20/2019 01/15/2018 08/07/2016  What Year? 0 points 0 points 0 points 0 points  What month? 0 points 0 points 0 points 0 points  What time? 0 points 0  points 0 points 0 points  Count back from 20 0 points 0 points 0 points 0 points  Months in reverse 0 points 0 points 4 points 0 points  Repeat phrase 0 points 0 points 0 points 0 points  Total Score 0 0 4 0    Immunizations Immunization History  Administered Date(s) Administered   Td 01/03/2005    TDAP status: Due, Education has been provided regarding the importance of this vaccine. Advised may receive this vaccine at local pharmacy or Health Dept. Aware to provide a copy of the vaccination record if obtained from local pharmacy or Health Dept. Verbalized acceptance and understanding.  Flu Vaccine status: Declined, Education has been provided regarding the importance of this vaccine but patient still declined. Advised may receive this vaccine at local pharmacy or Health Dept. Aware to provide a copy of the vaccination record if obtained from local pharmacy or Health Dept. Verbalized acceptance and understanding.  Pneumococcal vaccine status: Declined,  Education has been provided regarding the importance of this vaccine but patient still declined. Advised may receive this vaccine at local pharmacy or Health Dept. Aware to provide a copy of the vaccination record if obtained from local pharmacy or Health Dept. Verbalized acceptance and understanding.   Covid-19 vaccine status: Declined, Education has been provided regarding the importance of this vaccine but patient still declined. Advised may receive this vaccine at local pharmacy or Health Dept.or vaccine clinic. Aware to provide a copy of the vaccination record if obtained from local pharmacy or Health Dept. Verbalized acceptance and understanding.  Qualifies for  Shingles Vaccine? Yes   Zostavax completed No   Shingrix Completed?: No.    Education has been provided regarding the importance of this vaccine. Patient has been advised to call insurance company to determine out of pocket expense if they have not yet received this vaccine.  Advised may also receive vaccine at local pharmacy or Health Dept. Verbalized acceptance and understanding.  Screening Tests Health Maintenance  Topic Date Due   COVID-19 Vaccine (1) Never done   MAMMOGRAM  04/26/2019   OPHTHALMOLOGY EXAM  06/15/2020   Zoster Vaccines- Shingrix (1 of 2) 03/22/2021 (Originally 01/12/1965)   TETANUS/TDAP  12/20/2021 (Originally 01/04/2015)   PNA vac Low Risk Adult (1 of 2 - PCV13) 12/20/2021 (Originally 01/11/2013)   INFLUENZA VACCINE  01/30/2021   HEMOGLOBIN A1C  06/21/2021   FOOT EXAM  12/23/2021   COLONOSCOPY (Pts 45-50yr Insurance coverage will need to be confirmed)  12/08/2023   DEXA SCAN  Completed   Hepatitis C Screening  Completed   HPV VACCINES  Aged Out    Health Maintenance  Health Maintenance Due  Topic Date Due   COVID-19 Vaccine (1) Never done   MAMMOGRAM  04/26/2019   OPHTHALMOLOGY EXAM  06/15/2020    Colorectal cancer screening: Type of screening: Colonoscopy. Completed 12-07-13. Repeat every 10 years  Mammogram status: Completed 04-25-17. Repeat every year  Bone Density status: Completed 11-06-13. Results reflect: Bone density results: NORMAL. Repeat every 5 years.  Lung Cancer Screening: (Low Dose CT Chest recommended if Age 73-80years, 30 pack-year currently smoking OR have quit w/in 15years.) does not qualify.   Lung Cancer Screening Referral: NA  Additional Screening:  Hepatitis C Screening: does qualify; Completed 09-28-14  Vision Screening: Recommended annual ophthalmology exams for early detection of glaucoma and other disorders of the eye. Is the patient up to date with their annual eye exam?  Yes  Who is the provider or what is the name of the office in which the patient attends annual eye exams[place] If pt is not established with a provider, would they like to be referred to a provider to establish care? No .   Dental Screening: Recommended annual dental exams for proper oral  hygiene  Community Resource Referral / Chronic Care Management: CRR required this visit?  No   CCM required this visit?  No      Plan:     I have personally reviewed and noted the following in the patient's chart:   Medical and social history Use of alcohol, tobacco or illicit drugs  Current medications and supplements including opioid prescriptions.  Functional ability and status Nutritional status Physical activity Advanced directives List of other physicians Hospitalizations, surgeries, and ER visits in previous 12 months Vitals Screenings to include cognitive, depression, and falls Referrals and appointments  In addition, I have reviewed and discussed with patient certain preventive protocols, quality metrics, and best practice recommendations. A written personalized care plan for preventive services as well as general preventive health recommendations were provided to patient.     SShelda Altes CMA   01/24/2021   Nurse Notes: Telehealth, Spent 40 mins talking to patient

## 2021-01-24 NOTE — Patient Instructions (Signed)
Stephanie Singleton , Thank you for taking time to come for your Medicare Wellness Visit. I appreciate your ongoing commitment to your health goals. Please review the following plan we discussed and let me know if I can assist you in the future.   Screening recommendations/referrals: Colonoscopy: Completed Due 12-08-23 Mammogram: Due now Patient would like to hold off on scheduling  Bone Density: Due now Patient would like to hold off on scheduling Recommended yearly ophthalmology/optometry visit for glaucoma screening and checkup Recommended yearly dental visit for hygiene and checkup  Vaccinations: Influenza vaccine: Due now patient declined  Pneumococcal vaccine: Due now patient declined  Tdap vaccine: Due now patient declined Shingles vaccine: Due now patient declined    Advanced directives: Patient declined information  Conditions/risks identified: Hypertension, Diabetes  Next appointment: 1 year    Preventive Care 73 Years and Older, Female Preventive care refers to lifestyle choices and visits with your health care provider that can promote health and wellness. What does preventive care include? A yearly physical exam. This is also called an annual well check. Dental exams once or twice a year. Routine eye exams. Ask your health care provider how often you should have your eyes checked. Personal lifestyle choices, including: Daily care of your teeth and gums. Regular physical activity. Eating a healthy diet. Avoiding tobacco and drug use. Limiting alcohol use. Practicing safe sex. Taking low-dose aspirin every day. Taking vitamin and mineral supplements as recommended by your health care provider. What happens during an annual well check? The services and screenings done by your health care provider during your annual well check will depend on your age, overall health, lifestyle risk factors, and family history of disease. Counseling  Your health care provider may ask you  questions about your: Alcohol use. Tobacco use. Drug use. Emotional well-being. Home and relationship well-being. Sexual activity. Eating habits. History of falls. Memory and ability to understand (cognition). Work and work Statistician. Reproductive health. Screening  You may have the following tests or measurements: Height, weight, and BMI. Blood pressure. Lipid and cholesterol levels. These may be checked every 5 years, or more frequently if you are over 66 years old. Skin check. Lung cancer screening. You may have this screening every year starting at age 73 if you have a 30-pack-year history of smoking and currently smoke or have quit within the past 15 years. Fecal occult blood test (FOBT) of the stool. You may have this test every year starting at age 73. Flexible sigmoidoscopy or colonoscopy. You may have a sigmoidoscopy every 5 years or a colonoscopy every 10 years starting at age 45. Hepatitis C blood test. Hepatitis B blood test. Sexually transmitted disease (STD) testing. Diabetes screening. This is done by checking your blood sugar (glucose) after you have not eaten for a while (fasting). You may have this done every 1-3 years. Bone density scan. This is done to screen for osteoporosis. You may have this done starting at age 65. Mammogram. This may be done every 1-2 years. Talk to your health care provider about how often you should have regular mammograms. Talk with your health care provider about your test results, treatment options, and if necessary, the need for more tests. Vaccines  Your health care provider may recommend certain vaccines, such as: Influenza vaccine. This is recommended every year. Tetanus, diphtheria, and acellular pertussis (Tdap, Td) vaccine. You may need a Td booster every 10 years. Zoster vaccine. You may need this after age 80. Pneumococcal 13-valent conjugate (PCV13) vaccine. One  dose is recommended after age 73. Pneumococcal polysaccharide  (PPSV23) vaccine. One dose is recommended after age 73. Talk to your health care provider about which screenings and vaccines you need and how often you need them. This information is not intended to replace advice given to you by your health care provider. Make sure you discuss any questions you have with your health care provider. Document Released: 07/15/2015 Document Revised: 03/07/2016 Document Reviewed: 04/19/2015 Elsevier Interactive Patient Education  2017 [place] Prevention in the Home Falls can cause injuries. They can happen to people of all ages. There are many things you can do to make your home safe and to help prevent falls. What can I do on the outside of my home? Regularly fix the edges of walkways and driveways and fix any cracks. Remove anything that might make you trip as you walk through a door, such as a raised step or threshold. Trim any bushes or trees on the path to your home. Use bright outdoor lighting. Clear any walking paths of anything that might make someone trip, such as rocks or tools. Regularly check to see if handrails are loose or broken. Make sure that both sides of any steps have handrails. Any raised decks and porches should have guardrails on the edges. Have any leaves, snow, or ice cleared regularly. Use sand or salt on walking paths during winter. Clean up any spills in your garage right away. This includes oil or grease spills. What can I do in the bathroom? Use night lights. Install grab bars by the toilet and in the tub and shower. Do not use towel bars as grab bars. Use non-skid mats or decals in the tub or shower. If you need to sit down in the shower, use a plastic, non-slip stool. Keep the floor dry. Clean up any water that spills on the floor as soon as it happens. Remove soap buildup in the tub or shower regularly. Attach bath mats securely with double-sided non-slip rug tape. Do not have throw rugs and other things on the  floor that can make you trip. What can I do in the bedroom? Use night lights. Make sure that you have a light by your bed that is easy to reach. Do not use any sheets or blankets that are too big for your bed. They should not hang down onto the floor. Have a firm chair that has side arms. You can use this for support while you get dressed. Do not have throw rugs and other things on the floor that can make you trip. What can I do in the kitchen? Clean up any spills right away. Avoid walking on wet floors. Keep items that you use a lot in easy-to-reach places. If you need to reach something above you, use a strong step stool that has a grab bar. Keep electrical cords out of the way. Do not use floor polish or wax that makes floors slippery. If you must use wax, use non-skid floor wax. Do not have throw rugs and other things on the floor that can make you trip. What can I do with my stairs? Do not leave any items on the stairs. Make sure that there are handrails on both sides of the stairs and use them. Fix handrails that are broken or loose. Make sure that handrails are as long as the stairways. Check any carpeting to make sure that it is firmly attached to the stairs. Fix any carpet that is loose or worn.  Avoid having throw rugs at the top or bottom of the stairs. If you do have throw rugs, attach them to the floor with carpet tape. Make sure that you have a light switch at the top of the stairs and the bottom of the stairs. If you do not have them, ask someone to add them for you. What else can I do to help prevent falls? Wear shoes that: Do not have high heels. Have rubber bottoms. Are comfortable and fit you well. Are closed at the toe. Do not wear sandals. If you use a stepladder: Make sure that it is fully opened. Do not climb a closed stepladder. Make sure that both sides of the stepladder are locked into place. Ask someone to hold it for you, if possible. Clearly mark and make  sure that you can see: Any grab bars or handrails. First and last steps. Where the edge of each step is. Use tools that help you move around (mobility aids) if they are needed. These include: Canes. Walkers. Scooters. Crutches. Turn on the lights when you go into a dark area. Replace any light bulbs as soon as they burn out. Set up your furniture so you have a clear path. Avoid moving your furniture around. If any of your floors are uneven, fix them. If there are any pets around you, be aware of where they are. Review your medicines with your doctor. Some medicines can make you feel dizzy. This can increase your chance of falling. Ask your doctor what other things that you can do to help prevent falls. This information is not intended to replace advice given to you by your health care provider. Make sure you discuss any questions you have with your health care provider. Document Released: 04/14/2009 Document Revised: 11/24/2015 Document Reviewed: 07/23/2014 Elsevier Interactive Patient Education  2017 [employer].

## 2021-02-12 NOTE — Telephone Encounter (Signed)
Opened in error

## 2021-02-14 ENCOUNTER — Telehealth: Payer: Self-pay | Admitting: *Deleted

## 2021-02-14 NOTE — Chronic Care Management (AMB) (Signed)
  Chronic Care Management   Note  02/14/2021 Name: Stephanie Singleton MRN: [id number] DOB: 1945-10-19  Stephanie Singleton is a 74 y.o. year old female who is a primary care patient of Fayrene Helper, MD. I reached out to [name] by phone today in response to a referral sent by Stephanie Singleton's PCP, Dr. Moshe Cipro.      Stephanie Singleton was given information about Chronic Care Management services today including:  CCM service includes personalized support from designated clinical staff supervised by her physician, including individualized plan of care and coordination with other care providers 24/7 contact phone numbers for assistance for urgent and routine care needs. Service will only be billed when office clinical staff spend 20 minutes or more in a month to coordinate care. Only one practitioner may furnish and bill the service in a calendar month. The patient may stop CCM services at any time (effective at the end of the month) by phone call to the office staff. The patient will be responsible for cost sharing (co-pay) of up to 20% of the service fee (after annual deductible is met).  Patient did not agree to enrollment in care management services and does not wish to consider at this time.  Follow up plan: Patient declines engagement by the care management team. Appropriate care team members and provider have been notified via electronic communication. The care management team is available to follow up with the patient after provider conversation with the patient regarding recommendation for care management engagement and subsequent re-referral to the care management team.   [facility]  Direct Dial: [phone]

## 2021-02-14 NOTE — Chronic Care Management (AMB) (Signed)
  Chronic Care Management   Outreach Note  02/14/2021 Name: Stephanie Singleton MRN: [id number] DOB: May 30, 1946  Stephanie Singleton is a 75 y.o. year old female who is a primary care patient of Fayrene Helper, MD. I reached out to [name] by phone today in response to a referral sent by Stephanie Singleton's PCP, Fayrene Helper, MD      An unsuccessful telephone outreach was attempted today. The patient was referred to the case management team for assistance with care management and care coordination.   Follow Up Plan: A HIPAA compliant phone message was left for the patient providing contact information and requesting a return call. The care management team will reach out to the patient again over the next 7 days.  If patient returns call to provider office, please advise to call [facility] at [phone].  [facility]  Direct Dial: [phone]

## 2021-02-22 ENCOUNTER — Other Ambulatory Visit: Payer: Self-pay

## 2021-02-22 ENCOUNTER — Ambulatory Visit (INDEPENDENT_AMBULATORY_CARE_PROVIDER_SITE_OTHER): Payer: Medicare Other

## 2021-02-22 DIAGNOSIS — Z7901 Long term (current) use of anticoagulants: Secondary | ICD-10-CM

## 2021-02-22 DIAGNOSIS — I272 Pulmonary hypertension, unspecified: Secondary | ICD-10-CM

## 2021-02-22 LAB — POCT INR: INR: 1.8 — AB (ref 2.0–3.0)

## 2021-02-22 NOTE — Patient Instructions (Signed)
Description   Take 1.5 tablets today, then resume same dosage of  Warfarin 1 tablet daily except for 1/2 tablet on Tuesdays and Thursdays. Recheck INR in 4 weeks. [facility] [phone].

## 2021-02-23 DIAGNOSIS — E11319 Type 2 diabetes mellitus with unspecified diabetic retinopathy without macular edema: Secondary | ICD-10-CM | POA: Diagnosis not present

## 2021-02-23 DIAGNOSIS — H25011 Cortical age-related cataract, right eye: Secondary | ICD-10-CM | POA: Diagnosis not present

## 2021-02-23 DIAGNOSIS — H35372 Puckering of macula, left eye: Secondary | ICD-10-CM | POA: Diagnosis not present

## 2021-02-23 DIAGNOSIS — H25012 Cortical age-related cataract, left eye: Secondary | ICD-10-CM | POA: Diagnosis not present

## 2021-02-23 LAB — HM DIABETES EYE EXAM

## 2021-03-22 ENCOUNTER — Other Ambulatory Visit: Payer: Self-pay

## 2021-03-22 ENCOUNTER — Ambulatory Visit (INDEPENDENT_AMBULATORY_CARE_PROVIDER_SITE_OTHER): Payer: Medicare Other | Admitting: *Deleted

## 2021-03-22 DIAGNOSIS — Z5181 Encounter for therapeutic drug level monitoring: Secondary | ICD-10-CM | POA: Diagnosis not present

## 2021-03-22 DIAGNOSIS — I272 Pulmonary hypertension, unspecified: Secondary | ICD-10-CM

## 2021-03-22 DIAGNOSIS — Z7901 Long term (current) use of anticoagulants: Secondary | ICD-10-CM

## 2021-03-22 LAB — POCT INR: INR: 1.5 — AB (ref 2.0–3.0)

## 2021-03-22 NOTE — Patient Instructions (Signed)
Description    Take 1.5 tablets of warfarin today and then START taking warfarin 1 tablet daily except for 1/2 on Tuesdays. Recheck INR in 2 weeks. [facility] [phone].

## 2021-04-05 ENCOUNTER — Other Ambulatory Visit: Payer: Self-pay

## 2021-04-05 ENCOUNTER — Ambulatory Visit (INDEPENDENT_AMBULATORY_CARE_PROVIDER_SITE_OTHER): Payer: Medicare Other | Admitting: *Deleted

## 2021-04-05 DIAGNOSIS — Z5181 Encounter for therapeutic drug level monitoring: Secondary | ICD-10-CM

## 2021-04-05 LAB — POCT INR: INR: 2.1 (ref 2.0–3.0)

## 2021-04-05 NOTE — Patient Instructions (Signed)
Description   Continue taking warfarin 1 tablet daily except for 1/2 on Tuesdays. Recheck INR in 3 weeks. [facility] [phone].

## 2021-04-08 ENCOUNTER — Other Ambulatory Visit: Payer: Self-pay | Admitting: Family Medicine

## 2021-04-10 ENCOUNTER — Other Ambulatory Visit: Payer: Self-pay | Admitting: Family Medicine

## 2021-04-10 ENCOUNTER — Telehealth: Payer: Self-pay | Admitting: Family Medicine

## 2021-04-10 MED ORDER — OMEPRAZOLE 40 MG PO CPDR: 40.0000 mg | DELAYED_RELEASE_CAPSULE | Freq: Every day | ORAL | 3 refills | Status: DC

## 2021-04-10 NOTE — Telephone Encounter (Signed)
Pt is calling to see if her medication pantoprazole (PROTONIX) 40 MG tablet    Can be changed, she feels this is causing the stomach issues

## 2021-04-10 NOTE — Telephone Encounter (Signed)
Wants the protonix changed to something else to see if it works better with less side effects

## 2021-04-11 NOTE — Telephone Encounter (Signed)
Pt aware.

## 2021-04-15 ENCOUNTER — Other Ambulatory Visit: Payer: Self-pay | Admitting: Family Medicine

## 2021-04-21 ENCOUNTER — Other Ambulatory Visit: Payer: Self-pay | Admitting: Family Medicine

## 2021-04-24 NOTE — Progress Notes (Deleted)
Cardiology Office Note    Date:  04/24/2021   ID:  Stephanie Singleton, DOB 10-23-1945, MRN [id number]  PCP:  Stephanie Helper, MD  Cardiologist:  Stephanie Dawley, MD (Inactive)  Electrophysiologist:  None   Chief Complaint: ***  History of Present Illness:   Stephanie Singleton is a 75 y.o. 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  She was remotely followed by Stephanie Singleton but established with Dr. Meda Singleton in 04/2016. LE arterial duplex for leg pain 05/2016 was normal. IAt a prior OV with Dr. Meda Singleton, Lyrica was prescribed for lower extremity pain/neuropathy. She was eventually able to stop this. She has previously been introduced to the idea of switching to Stephanie Singleton but has wished to remain on Coumadin. Notes outline that PE was felt to be unprovoked, prompting lifelong anticoagulation as tolerated.   I met her in clinic in 2018 at which time she was reporting chronic fatigue x 15 years and gradual DOE in the setting of weight gain. Of note, she has 13 children which includes 3 sets of twins. 2D Echo 01/08/17 showed EF 60-65%, grade 1 DD, mildly dilated ascending aorta, no significant pulm HTN, normal CVP. Nuc 12/2016 was normal. The patient declined to see pulmonology. Repeat sleep study 2018 showed no significant OSA on home study - in-lab study was recommended but patient declined. She has declined to have repeat echo for her dilated aorta. At last OV 05/2020 her HCTZ was changed to chlorthalidone due to uncontrolled HTN. She declined to do so and did not wish to go back to HCTZ either. She previously self-discontinued amlodipine due to lower extremity edema. She planned to f/u with primary care for BP instead. She was seen at an outside [facility] 10/2020 with elevated BP and  chest tightness. Troponins were negative. Admission was recommended for hypertensive crisis but she signed out AMA.    Essential HTN Chronic diastolic CHF Mildly dilated ascending aorta History of PE  Labwork independently reviewed: 11/2020 K 3.9, Cr 1.19, glucose 194 10/2020 Hgb 13.4, troponins negative, BNP wnl, Mg 1.7   Past Medical History:  Diagnosis Date   Chronic diastolic CHF (congestive heart failure) (HCC)    CKD (chronic kidney disease), stage II    Diabetes mellitus DX X 1 YR   USE METFORMIN ACCORDING TO CBG RESULT   Dilatation of aorta (HCC)    Embolism - blood clot 2004   Pulmonary .on coumadin   GERD (gastroesophageal reflux disease)    ON PREVACID   HTN (hypertension)    EKG 10/23 IN EPIC   Hyperlipidemia    Neuromuscular disorder (HCC)    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   Tumor of soft tissue of neck     Past Surgical History:  Procedure Laterality Date   CERVICAL FUSION      Current Medications: No outpatient medications have been marked as taking for the 04/28/21 encounter (Appointment) with Stephanie Pitter, PA-C.   ***   Allergies:   Aspirin and Lisinopril   Social History   Socioeconomic History   Marital status: Divorced  Spouse name: Not on file   Number of children: 24   Years of education: Not on file   Highest education level: Some college, no degree  Occupational History    Employer: DISABLED     Comment: [employer]  Tobacco Use   Smoking status: Former    Packs/day: 0.25    Years: 20.00    Pack years: 5.00    Types: Cigarettes    Quit date: 08/07/1996    Years since quitting: 24.7   Smokeless tobacco: Never  Substance and Sexual Activity   Alcohol use: No    Comment: upper limits of week, 1-2 daily- until 07/2018, no longer having alcohol per patient   Drug  use: No   Sexual activity: Yes  Other Topics Concern   Not on file  Social History Narrative   Not on file   Social Determinants of Health   Financial Resource Strain: Low Risk    Difficulty of Paying Living Expenses: Not hard at all  Food Insecurity: No Food Insecurity   Worried About Charity fundraiser in the Last Year: Never true   Anon Raices in the Last Year: Never true  Transportation Needs: No Transportation Needs   Lack of Transportation (Medical): No   Lack of Transportation (Non-Medical): No  Physical Activity: Sufficiently Active   Days of Exercise per Week: 5 days   Minutes of Exercise per Session: 30 min  Stress: No Stress Concern Present   Feeling of Stress : Not at all  Social Connections: Moderately Integrated   Frequency of Communication with Friends and Family: More than three times a week   Frequency of Social Gatherings with Friends and Family: More than three times a week   Attends Religious Services: More than 4 times per year   Active Member of [employer] or Organizations: No   Attends Music therapist: Never   Marital Status: Married     Family History:  The patient's ***family history includes Bladder Cancer (age of onset: 18) in her sister; Breast cancer (age of onset: 35) in her sister; Diabetes in her daughter, father, and son; Heart disease in her brother and mother; Hypertension in her mother; Liver disease in her son; Peripheral vascular disease in her brother and father; Stroke in her mother. There is no history of Colon cancer, Pancreatic cancer, Stomach cancer, or Esophageal cancer.  ROS:   Please see the history of present illness. Otherwise, review of systems is positive for ***.  All other systems are reviewed and otherwise negative.    EKGs/Labs/Other Studies Reviewed:    Studies reviewed are outlined and summarized above. Reports included below if pertinent.  2D echo 12/2016 Left ventricle: The cavity size was normal.  There was mild    concentric hypertrophy. Systolic function was normal. The    estimated ejection fraction was in the range of 60% to 65%. Wall    motion was normal; there were no regional wall motion    abnormalities. Doppler parameters are consistent with abnormal    left ventricular relaxation (grade 1 diastolic dysfunction). The    E/e&' ratio is between 8-15, suggesting indeterminate LV filling    pressure.  - Aorta: Ascending aortic diameter: 39 mm (S).  - Ascending aorta: The ascending aorta was mildly dilated.  - Mitral valve: Calcified annulus. There was trivial regurgitation.  - Tricuspid valve: There was mild regurgitation.  - Pulmonic valve: The valve appears to be grossly normal. There was  no significant regurgitation.  - Pulmonary arteries: PA peak pressure: 24 mm Hg (S).  - Inferior vena cava: The vessel was normal in size. The    respirophasic diameter changes were in the normal range (>= 50%),    consistent with normal central venous pressure.   Impressions:   - Compared to a prior study in 2014, there have been few changes.    LVEF 60-65%. The ascending aorta is mildly dilated at 3.9 cm.   NST 12/2016 Nuclear stress EF: 54%. The study is normal. Normal perfusion. This is a low risk study.    EKG:  EKG is ordered today, personally reviewed, demonstrating ***  Recent Labs: 05/11/2020: Hemoglobin 13.6; Platelets 379 12/16/2020: BUN 17; Creatinine, Ser 1.19; Potassium 3.9; Sodium 141  Recent Lipid Panel    Component Value Date/Time   CHOL 153 03/31/2020 0000   TRIG 126 03/31/2020 0000   HDL 44 03/31/2020 0000   CHOLHDL 3.5 03/31/2020 0000   CHOLHDL 4.0 06/27/2016 1428   VLDL 55 (H) 06/27/2016 1428   LDLCALC 86 03/31/2020 0000    PHYSICAL EXAM:    VS:  There were no vitals taken for this visit.  BMI: There is no height or weight on file to calculate BMI.  GEN: Well nourished, well developed female in no acute distress HEENT: normocephalic,  atraumatic Neck: no JVD, carotid bruits, or masses Cardiac: ***RRR; no murmurs, rubs, or gallops, no edema  Respiratory:  clear to auscultation bilaterally, normal work of breathing GI: soft, nontender, nondistended, + BS MS: no deformity or atrophy Skin: warm and dry, no rash Neuro:  Alert and Oriented x 3, Strength and sensation are intact, follows commands Psych: euthymic mood, full affect  Wt Readings from Last 3 Encounters:  12/20/20 219 lb (99.3 kg)  05/03/20 221 lb 9.6 oz (100.5 kg)  04/04/20 222 lb 0.6 oz (100.7 kg)     ASSESSMENT & PLAN:   ***     Disposition: F/u with ***   Medication Adjustments/Labs and Tests Ordered: Current medicines are reviewed at length with the patient today.  Concerns regarding medicines are outlined above. Medication changes, Labs and Tests ordered today are summarized above and listed in the Patient Instructions accessible in Encounters.   Signed, Stephanie Pitter, PA-C  04/24/2021 5:27 PM    [facility] [address], [place], [place]  [address] Phone: [phone]; Fax: [phone]

## 2021-04-25 ENCOUNTER — Ambulatory Visit: Payer: Medicare Other | Admitting: Family Medicine

## 2021-04-28 ENCOUNTER — Ambulatory Visit: Payer: Medicare Other | Admitting: Physician Assistant

## 2021-04-28 DIAGNOSIS — I10 Essential (primary) hypertension: Secondary | ICD-10-CM

## 2021-04-28 DIAGNOSIS — Z86711 Personal history of pulmonary embolism: Secondary | ICD-10-CM

## 2021-04-28 DIAGNOSIS — I77810 Thoracic aortic ectasia: Secondary | ICD-10-CM

## 2021-04-28 DIAGNOSIS — I5032 Chronic diastolic (congestive) heart failure: Secondary | ICD-10-CM

## 2021-05-08 ENCOUNTER — Ambulatory Visit (INDEPENDENT_AMBULATORY_CARE_PROVIDER_SITE_OTHER): Payer: Medicare Other

## 2021-05-08 ENCOUNTER — Other Ambulatory Visit: Payer: Self-pay

## 2021-05-08 DIAGNOSIS — Z5181 Encounter for therapeutic drug level monitoring: Secondary | ICD-10-CM

## 2021-05-08 DIAGNOSIS — I272 Pulmonary hypertension, unspecified: Secondary | ICD-10-CM

## 2021-05-08 LAB — POCT INR: INR: 2.6 (ref 2.0–3.0)

## 2021-05-08 NOTE — Patient Instructions (Signed)
Continue taking warfarin 1 tablet daily except for 1/2 on Tuesdays. Recheck INR in 4 weeks. [facility] [phone].

## 2021-06-13 ENCOUNTER — Other Ambulatory Visit: Payer: Self-pay

## 2021-06-13 ENCOUNTER — Ambulatory Visit (INDEPENDENT_AMBULATORY_CARE_PROVIDER_SITE_OTHER): Payer: Medicare Other | Admitting: *Deleted

## 2021-06-13 DIAGNOSIS — Z5181 Encounter for therapeutic drug level monitoring: Secondary | ICD-10-CM | POA: Diagnosis not present

## 2021-06-13 DIAGNOSIS — Z7901 Long term (current) use of anticoagulants: Secondary | ICD-10-CM

## 2021-06-13 DIAGNOSIS — I272 Pulmonary hypertension, unspecified: Secondary | ICD-10-CM | POA: Diagnosis not present

## 2021-06-13 LAB — POCT INR: INR: 1.7 — AB (ref 2.0–3.0)

## 2021-06-13 NOTE — Patient Instructions (Addendum)
Description   Today take 1 tablet of Warfarin then continue taking warfarin 1 tablet daily except for 1/2 on Tuesdays. Recheck INR in 3 weeks. [facility] [phone].

## 2021-07-04 ENCOUNTER — Encounter: Payer: Self-pay | Admitting: Family Medicine

## 2021-07-04 ENCOUNTER — Other Ambulatory Visit: Payer: Self-pay | Admitting: Family Medicine

## 2021-07-04 ENCOUNTER — Ambulatory Visit (INDEPENDENT_AMBULATORY_CARE_PROVIDER_SITE_OTHER): Payer: Commercial Managed Care - HMO | Admitting: Family Medicine

## 2021-07-04 ENCOUNTER — Other Ambulatory Visit: Payer: Self-pay

## 2021-07-04 VITALS — BP 160/80 | HR 64 | Resp 16 | Ht 66.0 in | Wt 224.0 lb

## 2021-07-04 DIAGNOSIS — E559 Vitamin D deficiency, unspecified: Secondary | ICD-10-CM | POA: Diagnosis not present

## 2021-07-04 DIAGNOSIS — E785 Hyperlipidemia, unspecified: Secondary | ICD-10-CM

## 2021-07-04 DIAGNOSIS — Z2821 Immunization not carried out because of patient refusal: Secondary | ICD-10-CM | POA: Diagnosis not present

## 2021-07-04 DIAGNOSIS — E1121 Type 2 diabetes mellitus with diabetic nephropathy: Secondary | ICD-10-CM

## 2021-07-04 DIAGNOSIS — I10 Essential (primary) hypertension: Secondary | ICD-10-CM

## 2021-07-04 DIAGNOSIS — F5104 Psychophysiologic insomnia: Secondary | ICD-10-CM

## 2021-07-04 MED ORDER — GLIPIZIDE ER 2.5 MG PO TB24: 2.5000 mg | ORAL_TABLET | Freq: Every day | ORAL | 2 refills | Status: DC

## 2021-07-04 MED ORDER — HYDROXYZINE PAMOATE 50 MG PO CAPS: ORAL_CAPSULE | ORAL | 3 refills | Status: DC

## 2021-07-04 MED ORDER — AMLODIPINE BESYLATE 2.5 MG PO TABS: 2.5000 mg | ORAL_TABLET | Freq: Every day | ORAL | 2 refills | Status: DC

## 2021-07-04 MED ORDER — OLMESARTAN MEDOXOMIL-HCTZ 40-25 MG PO TABS: 1.0000 | ORAL_TABLET | Freq: Every day | ORAL | 3 refills | Status: DC

## 2021-07-04 NOTE — Patient Instructions (Addendum)
F/U in early April. Call if you need me before   CBC, lipid, cmp and eGFr, TSH and vit D today  New medication for BP is amlodipine 2.5 mg one daily Olmesartan and HCTZ are combined in one pill olmesartan/HCTZ 40/25 one daily, stop taking them separately once you this medication  New additional glipizide tablet 2.5 mg , take along with the 5 mg tablet and continue tradjenta one daily as before  New higher dose hydroxyzines 50 mg two a night  Please reconsider the mammogram  Thanks for choosing [facility], we consider it a privelige to serve you.

## 2021-07-06 ENCOUNTER — Encounter: Payer: Self-pay | Admitting: Family Medicine

## 2021-07-06 DIAGNOSIS — G4700 Insomnia, unspecified: Secondary | ICD-10-CM | POA: Insufficient documentation

## 2021-07-06 LAB — POCT GLYCOSYLATED HEMOGLOBIN (HGB A1C): HbA1c, POC (controlled diabetic range): 7.4 % — AB (ref 0.0–7.0)

## 2021-07-06 NOTE — Assessment & Plan Note (Signed)
Uncontrolled, add amlodipine DASH diet and commitment to daily physical activity for a minimum of 30 minutes discussed and encouraged, as a part of hypertension management. The importance of attaining a healthy weight is also discussed.  BP/Weight 07/04/2021 12/20/2020 11/02/2020 05/03/2020 04/04/2020 9/79/8921 07/10/4172  Systolic BP 081 448 185 631 497 026 378  Diastolic BP 80 81 77 80 82 94 94  Wt. (Lbs) 224 219 - 221.6 222.04 223 223  BMI 36.15 35.35 - 35.77 35.84 35.99 35.99

## 2021-07-06 NOTE — Assessment & Plan Note (Addendum)
Stephanie Singleton is reminded of the importance of commitment to daily physical activity for 30 minutes or more, as able and the need to limit carbohydrate intake to 30 to 60 grams per meal to help with blood sugar control.   The need to take medication as prescribed, test blood sugar as directed, and to call between visits if there is a concern that blood sugar is uncontrolled is also discussed.   Stephanie Singleton is reminded of the importance of daily foot exam, annual eye examination, and good blood sugar, blood pressure and cholesterol control. Increase glipizide dose to 7.5 mg daily Diabetic Labs Latest Ref Rng & Units 12/20/2020 12/16/2020 05/09/2020 04/05/2020 03/31/2020  HbA1c 0.0 - 7.0 % 7.5(A) - - - 7.1(H)  Microalbumin Not Estab. ug/mL - - - 9.3(H) -  Micro/Creat Ratio <30 mcg/mg creat - - - - -  Chol 100 - 199 mg/dL - - - - 153  HDL >39 mg/dL - - - - 44  Calc LDL 0 - 99 mg/dL - - - - 86  Triglycerides 0 - 149 mg/dL - - - - 126  Creatinine 0.57 - 1.00 mg/dL - 1.19(H) 0.86 - 1.07(H)   BP/Weight 07/04/2021 12/20/2020 11/02/2020 05/03/2020 04/04/2020 1/73/5670 07/05/1028  Systolic BP 131 438 887 579 728 206 015  Diastolic BP 80 81 77 80 82 94 94  Wt. (Lbs) 224 219 - 221.6 222.04 223 223  BMI 36.15 35.35 - 35.77 35.84 35.99 35.99   Foot/eye exam completion dates Latest Ref Rng & Units 06/15/2019 06/15/2019  Eye Exam No Retinopathy Retinopathy(A) Retinopathy(A)  Foot Form Completion - - -

## 2021-07-06 NOTE — Progress Notes (Signed)
Stephanie Singleton     MRN: [id number]      DOB: 03/18/1946   HPI Stephanie Singleton is here for follow up and re-evaluation of chronic medical conditions, medication management and review of any available recent lab and radiology data.  Preventive health is updated, specifically  Cancer screening and Immunization.   Questions or concerns regarding consultations or procedures which the PT has had in the interim are  addressed. The PT denies any adverse reactions to current medications since the last visit.  There are no new concerns.  There are no specific complaints   ROS Denies recent fever or chills. Denies sinus pressure, nasal congestion, ear pain or sore throat. Denies chest congestion, productive cough or wheezing. Denies chest pains, palpitations and leg swelling Denies abdominal pain, nausea, vomiting,diarrhea or constipation.   Denies dysuria, frequency, hesitancy or incontinence. Denies uncontrolled  joint pain, swelling and limitation in mobility. Denies headaches, seizures, numbness, or tingling. Increased stress due to poor health of her spouse. C/o poor sleep and  drinks beer to help her sleep Denies skin break down or rash.   PE  BP (!) 160/80    Pulse 64    Resp 16    Ht 5\' 6"  (1.676 m)    Wt 224 lb (101.6 kg)    SpO2 94%    BMI 36.15 kg/m   Patient alert and oriented and in no cardiopulmonary distress.  HEENT: No facial asymmetry, EOMI,     Neck supple .  Chest: Clear to auscultation bilaterally.  CVS: S1, S2 no murmurs, no S3.Regular rate.  ABD: Soft non tender.   Ext: No edema  MS: Adequate ROM spine, shoulders, hips and knees.  Skin: Intact, no ulcerations or rash noted.  Psych: Good eye contact, normal affect. Memory intact not anxious or depressed appearing.  CNS: CN 2-12 intact, power,  normal throughout.no focal deficits noted.   Assessment & Plan  Hypertension goal BP (blood pressure) < 130/80 Uncontrolled, add amlodipine DASH diet and  commitment to daily physical activity for a minimum of 30 minutes discussed and encouraged, as a part of hypertension management. The importance of attaining a healthy weight is also discussed.  BP/Weight 07/04/2021 12/20/2020 11/02/2020 05/03/2020 04/04/2020 6/54/6503 11/03/6566  Systolic BP 127 517 001 749 449 675 916  Diastolic BP 80 81 77 80 82 94 94  Wt. (Lbs) 224 219 - 221.6 222.04 223 223  BMI 36.15 35.35 - 35.77 35.84 35.99 35.99       Hyperlipidemia LDL goal <100 Updated lab needed at/ before next visit. Hyperlipidemia:Low fat diet discussed and encouraged.   Lipid Panel  Lab Results  Component Value Date   CHOL 153 03/31/2020   HDL 44 03/31/2020   LDLCALC 86 03/31/2020   TRIG 126 03/31/2020   CHOLHDL 3.5 03/31/2020       Type 2 diabetes with nephropathy ([place]) Stephanie Singleton is reminded of the importance of commitment to daily physical activity for 30 minutes or more, as able and the need to limit carbohydrate intake to 30 to 60 grams per meal to help with blood sugar control.   The need to take medication as prescribed, test blood sugar as directed, and to call between visits if there is a concern that blood sugar is uncontrolled is also discussed.   Stephanie Singleton is reminded of the importance of daily foot exam, annual eye examination, and good blood sugar, blood pressure and cholesterol control. Increase glipizide dose to 7.5 mg daily Diabetic Labs  Latest Ref Rng & Units 12/20/2020 12/16/2020 05/09/2020 04/05/2020 03/31/2020  HbA1c 0.0 - 7.0 % 7.5(A) - - - 7.1(H)  Microalbumin Not Estab. ug/mL - - - 9.3(H) -  Micro/Creat Ratio <30 mcg/mg creat - - - - -  Chol 100 - 199 mg/dL - - - - 153  HDL >39 mg/dL - - - - 44  Calc LDL 0 - 99 mg/dL - - - - 86  Triglycerides 0 - 149 mg/dL - - - - 126  Creatinine 0.57 - 1.00 mg/dL - 1.19(H) 0.86 - 1.07(H)   BP/Weight 07/04/2021 12/20/2020 11/02/2020 05/03/2020 04/04/2020 3/53/6144 08/30/5398  Systolic BP 867 619 509 326 712 458 099  Diastolic  BP 80 81 77 80 82 94 94  Wt. (Lbs) 224 219 - 221.6 222.04 223 223  BMI 36.15 35.35 - 35.77 35.84 35.99 35.99   Foot/eye exam completion dates Latest Ref Rng & Units 06/15/2019 06/15/2019  Eye Exam No Retinopathy Retinopathy(A) Retinopathy(A)  Foot Form Completion - - -        Morbid obesity ([name])  Patient re-educated about  the importance of commitment to a  minimum of 150 minutes of exercise per week as able.  The importance of healthy food choices with portion control discussed, as well as eating regularly and within a 12 hour window most days. The need to choose "clean , green" food 50 to 75% of the time is discussed, as well as to make water the primary drink and set a goal of 64 ounces water daily.    Weight /BMI 07/04/2021 12/20/2020 05/03/2020  WEIGHT 224 lb 219 lb 221 lb 9.6 oz  HEIGHT 5\' 6"  5\' 6"  5\' 6"   BMI 36.15 kg/m2 35.35 kg/m2 35.77 kg/m2      Vaccination not carried out because of patient refusal Offered and again pt refuses vaccines   Insomnia Sleep hygiene reviewed and written information offered also. Prescription sent for  medication needed. Increase dose of trazodone

## 2021-07-06 NOTE — Addendum Note (Signed)
Addended by: Quentin Angst on: 07/06/2021 04:38 PM   Modules accepted: Orders

## 2021-07-06 NOTE — Assessment & Plan Note (Signed)
Sleep hygiene reviewed and written information offered also. Prescription sent for  medication needed. Increase dose of trazodone

## 2021-07-06 NOTE — Assessment & Plan Note (Signed)
Offered and again pt refuses vaccines

## 2021-07-06 NOTE — Assessment & Plan Note (Signed)
Updated lab needed at/ before next visit. Hyperlipidemia:Low fat diet discussed and encouraged.   Lipid Panel  Lab Results  Component Value Date   CHOL 153 03/31/2020   HDL 44 03/31/2020   LDLCALC 86 03/31/2020   TRIG 126 03/31/2020   CHOLHDL 3.5 03/31/2020

## 2021-07-06 NOTE — Assessment & Plan Note (Signed)
°  Patient re-educated about  the importance of commitment to a  minimum of 150 minutes of exercise per week as able.  The importance of healthy food choices with portion control discussed, as well as eating regularly and within a 12 hour window most days. The need to choose "clean , green" food 50 to 75% of the time is discussed, as well as to make water the primary drink and set a goal of 64 ounces water daily.    Weight /BMI 07/04/2021 12/20/2020 05/03/2020  WEIGHT 224 lb 219 lb 221 lb 9.6 oz  HEIGHT 5\' 6"  5\' 6"  5\' 6"   BMI 36.15 kg/m2 35.35 kg/m2 35.77 kg/m2

## 2021-07-08 ENCOUNTER — Other Ambulatory Visit: Payer: Self-pay | Admitting: Family Medicine

## 2021-07-14 ENCOUNTER — Other Ambulatory Visit: Payer: Self-pay | Admitting: Family Medicine

## 2021-07-19 ENCOUNTER — Other Ambulatory Visit: Payer: Self-pay | Admitting: Physician Assistant

## 2021-07-19 NOTE — Progress Notes (Signed)
Cardiology Office Note    Date:  07/20/2021   ID:  Stephanie Singleton, DOB 17-Feb-1946, MRN [id number]  PCP:  Fayrene Helper, MD  Cardiologist:  Ena Dawley, MD  Electrophysiologist:  None   Chief Complaint: f/u  History of Present Illness:   Stephanie Singleton is a 76 y.o. 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   She previously followed with Dr. Meda Coffee for PE and CHF. LE arterial duplex for leg pain 05/2016 was normal. She has previously been introduced to the idea of switching to DOAC but wished to remain on Coumadin. 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 I met her in clinic in 2018 at which time she was reporting chronic fatigue x 15 years and gradual DOE in the setting of weight gain. Of note, she has 13 children which includes 3 sets of twins. 2D Echo 01/08/17 showed EF 60-65%, grade 1 DD, mildly dilated ascending aorta, no significant pulm HTN, normal CVP. Nuclear stress test 12/2016 was normal. She declined to see pulmonology. Repeat sleep study 2018 showed no significant OSA on home study; in-lab study was recommended but patient declined to proceed. She has declined to have repeat echo for her dilated aorta. At [facility] 05/2020 her HCTZ was changed to chlorthalidone due to uncontrolled HTN. She declined to do so and did not wish to go back to HCTZ either. She previously self-discontinued amlodipine due to lower extremity edema. She planned to f/u with primary care for BP instead. She was seen at an outside ER 10/2020 with elevated BP and chest tightness. Troponins were negative. Admission was recommended for hypertensive crisis but she signed out AMA.   She returns for follow-up today overall doing well. Overall she feels she is stable from a cardiac  standpoint.  She has chronic unchanged DOE with higher levels of activity. She takes torsemide 1-3x/week and reports this controls her swelling very well. She otherwise denies any CP or syncope. She's been caring for her 9 year old husband who has been in poor health the last few months. This has contributed to her continued fatigue since she is always on call for him and gets only interrupted sleep. She feels the fatigue corresponds with the level of stress. She has since seen primary care earlier this month with plan to consolidate her olmesartan/HCTZ to one combination pill and add amlodipine. She has not yet made this change but plans to do so. She did not get a chance to get her labs drawn at that recent visit because her husband was in the car honking the horn anxious for her return. Today is the first day she's been able to be out by herself in the last year (has family helping out).  Labwork independently reviewed: 11/2020 K 3.9, Cr 1.19, glucose 194  10/2020 Hgb 13.4, troponins negative, BNP wnl, Mg 1.7    Cardiology Studies:   Studies reviewed are outlined and summarized above. Reports included below if pertinent.   2D echo 12/2016  Left ventricle: The cavity size was normal. There was mild    concentric hypertrophy. Systolic function was normal. The    estimated ejection fraction was in the range of 60% to 65%. Wall    motion was normal; there were no regional wall motion    abnormalities. Doppler parameters are  consistent with abnormal    left ventricular relaxation (grade 1 diastolic dysfunction). The    E/e&' ratio is between 8-15, suggesting indeterminate LV filling    pressure.  - Aorta: Ascending aortic diameter: 39 mm (S).  - Ascending aorta: The ascending aorta was mildly dilated.  - Mitral valve: Calcified annulus. There was trivial regurgitation.  - Tricuspid valve: There was mild regurgitation.  - Pulmonic valve: The valve appears to be grossly normal. There was    no  significant regurgitation.  - Pulmonary arteries: PA peak pressure: 24 mm Hg (S).  - Inferior vena cava: The vessel was normal in size. The    respirophasic diameter changes were in the normal range (>= 50%),    consistent with normal central venous pressure.   Impressions:   - Compared to a prior study in 2014, there have been few changes.    LVEF 60-65%. The ascending aorta is mildly dilated at 3.9 cm.   NST 12/2016   Nuclear stress EF: 54%.   The study is normal. Normal perfusion.   This is a low risk study.     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   Tumor of soft tissue of neck     Past Surgical History:  Procedure Laterality Date   CERVICAL FUSION      Current Medications: Current Meds  Medication Sig   ACCU-CHEK AVIVA PLUS test strip USE TO TEST ONCE DAILY   Accu-Chek FastClix Lancets MISC USE 1 UNIT 2 (TWO) TIMES A DAY.   amLODipine (NORVASC) 2.5 MG tablet Take 1 tablet (2.5 mg total) by mouth daily.   gabapentin (NEURONTIN) 300 MG capsule Take 1 capsule (300 mg total) by mouth 3 (three) times daily.   glipiZIDE (GLIPIZIDE XL) 2.5 MG 24 hr tablet Take 1 tablet (2.5 mg total) by mouth daily with breakfast.   glipiZIDE (GLUCOTROL XL) 5 MG 24 hr tablet Take 1 tablet (5 mg total) by mouth daily with breakfast.   hydrochlorothiazide (HYDRODIURIL) 25 MG tablet Take  25 mg by mouth daily.   hydrOXYzine (ATARAX/VISTARIL) 50 MG tablet TAKE 1 TABLET BY MOUTH EVERYDAY AT BEDTIME   hydrOXYzine (VISTARIL) 50 MG capsule Take one to two capsules at bedtime , as needed, for sleep   olmesartan (BENICAR) 40 MG tablet    olmesartan-hydrochlorothiazide (BENICAR HCT) 40-25 MG tablet Take 1 tablet by mouth daily.   omeprazole (PRILOSEC) 40 MG capsule TAKE 1 CAPSULE (40 MG TOTAL) BY MOUTH DAILY.   potassium chloride SA (KLOR-CON M20) 20 MEQ tablet Take 1 tablet by mouth daily as needed, only on days you take torsemide   pravastatin (PRAVACHOL) 20 MG tablet TAKE 1 TABLET BY MOUTH EVERY DAY   torsemide (DEMADEX) 20 MG tablet TAKE 1  TABLET 2 TIMES A WEEK AS NEEDED FOR SWELLING. Please make overdue appt with Dr. Meda Coffee before anymore refills. 1st attempt   TRADJENTA 5 MG TABS tablet TAKE 1 TABLET BY MOUTH EVERY DAY   warfarin (COUMADIN) 2.5 MG tablet TAKE 1 TABLET BY MOUTH DAILY OR AS DIRECTED BY COUMADIN CLINIC      Allergies:   Aspirin and Lisinopril   Social History   Socioeconomic History   Marital status: Divorced    Spouse name: Not on file   Number of children: 65   Years of education: Not on file   Highest education level: Some college, no degree  Occupational History    Employer: DISABLED     Comment: [employer]  Tobacco Use   Smoking status: Former    Packs/day: 0.25    Years: 20.00    Pack years: 5.00    Types: Cigarettes    Quit date: 08/07/1996    Years since quitting: 24.9   Smokeless tobacco: Never  Substance and Sexual Activity   Alcohol use: No    Comment: upper limits of week, 1-2 daily- until 07/2018, no longer having alcohol per patient   Drug use: No   Sexual activity: Yes  Other Topics Concern   Not on file  Social History Narrative   Not on file   Social Determinants of Health   Financial Resource Strain: Low Risk    Difficulty of Paying Living Expenses: Not hard at all  Food Insecurity: No Food Insecurity   Worried About Paediatric nurse in the Last Year: Never true   [place] in the Last Year: Never true  Transportation Needs: No Transportation Needs   Lack of Transportation (Medical): No   Lack of Transportation (Non-Medical): No  Physical Activity: Sufficiently Active   Days of Exercise per Week: 5 days   Minutes of Exercise per Session: 30 min  Stress: No Stress Concern Present   Feeling of Stress : Not at all  Social Connections: Moderately Integrated   Frequency of Communication with Friends and Family: More than three times a week   Frequency of Social Gatherings with Friends and Family: More than three times a week   Attends Religious Services: More than 4 times per year   Active Member of [employer] or Organizations: No   Attends Music therapist: Never   Marital Status: Married     Family History:  The patient's family history includes Bladder Cancer (age of onset: 84) in her sister; Breast cancer (age of onset: 43) in her sister; Diabetes in her daughter, father, and son; Heart disease in her brother and mother; Hypertension in her mother; Liver disease in her son; Peripheral vascular disease in her brother and father; Stroke in her mother. There is no history of Colon cancer, Pancreatic cancer, Stomach cancer, or Esophageal cancer.  ROS:   Please see the history of present illness.  All other systems are reviewed and otherwise negative.    EKG(s)/Additional Labs   EKG:  EKG is ordered today, personally reviewed, demonstrating NSR 70bpm, nonspecific STTW changes similar to prior.  Recent Labs: 12/16/2020: BUN 17; Creatinine, Ser 1.19; Potassium 3.9; Sodium 141  Recent Lipid Panel    Component Value Date/Time   CHOL 153 03/31/2020 0000   TRIG 126 03/31/2020 0000   HDL 44 03/31/2020 0000   CHOLHDL 3.5 03/31/2020 0000   CHOLHDL 4.0 06/27/2016 1428   VLDL 55 (H) 06/27/2016 1428   LDLCALC 86 03/31/2020 0000  PHYSICAL EXAM:    VS:  BP (!) 142/86    Pulse 70    Ht '5\' 6"'   (1.676 m)    Wt 225 lb 6.4 oz (102.2 kg)    SpO2 98%    BMI 36.38 kg/m   BMI: Body mass index is 36.38 kg/m.  GEN: Well nourished, well developed female in no acute distress HEENT: normocephalic, atraumatic Neck: no JVD, carotid bruits, or masses Cardiac: RRR; no murmurs, rubs, or gallops, no edema  Respiratory:  clear to auscultation bilaterally, normal work of breathing GI: soft, nontender, nondistended, + BS MS: no deformity or atrophy Skin: warm and dry, no rash Neuro:  Alert and Oriented x 3, Strength and sensation are intact, follows commands Psych: euthymic mood, full affect  Wt Readings from Last 3 Encounters:  07/20/21 225 lb 6.4 oz (102.2 kg)  07/04/21 224 lb (101.6 kg)  12/20/20 219 lb (99.3 kg)     ASSESSMENT & PLAN:   1. Essential HTN - management has subsequently been assumed by primary care. She has plans to consolidate her olmesartan/HCT and start amlodipine as directed. Encouraged to follow plan as recommended. She has f/u with them in April to reassess control. It has been somewhat challenging in the past to manage this as she has been hesitant to adjust her regimen in the past. If she has recurrent issues with edema on amlodipine, consideration could be given to trial of low dose carvedilol versus spironolactone.  2. Chronic diastolic CHF- appears euvolemic. She has chronic DOE which is unchanged from prior. No accelerating symptoms, edema or orthopnea. She is doing a good job of managing her torsemide PRN. Reviewed 2g sodium restriction, 2L fluid restriction, daily weights with patient. Discussed importance of blood pressure control.  3. Mildly dilated ascending aorta - discussed repeating echocardiogram. She continues to decline echo. Good blood pressure control remains essential.  4. History of PE - anticoagulated with lifelong warfarin, no bleeding issues. Update CBC today. Follows with Coumadin clinic.  5. Chronic fatigue - this is a longstanding issue for  patient without acute change. She feels at this juncture it's situational related to her caregiver role. She has previously declined in-lab sleep study. In general she does not like to pursue testing. We will go ahead and obtain her labs intended to be drawn at primary care today - will get CBC, TSH, vitamin D level as recommended as well as CMET/lipid profile.  6. Hyperlipidemia - typically followed by primary care, but she had recent issues getting her labs at her PCP [facility] due to time constraints due to caring for her husband. Obtain CMET, lipid profile today while she is here.     Disposition: F/u with myself or establish care with Dr. Johney Frame in 1 year.   Medication Adjustments/Labs and Tests Ordered: Current medicines are reviewed at length with the patient today.  Concerns regarding medicines are outlined above. Medication changes, Labs and Tests ordered today are summarized above and listed in the Patient Instructions accessible in Encounters.   Signed, Charlie Pitter, PA-C  07/20/2021 1:58 PM    [facility] Phone: [phone]; Fax: [phone]

## 2021-07-20 ENCOUNTER — Encounter: Payer: Self-pay | Admitting: Physician Assistant

## 2021-07-20 ENCOUNTER — Ambulatory Visit (INDEPENDENT_AMBULATORY_CARE_PROVIDER_SITE_OTHER): Payer: Commercial Managed Care - HMO | Admitting: Physician Assistant

## 2021-07-20 ENCOUNTER — Other Ambulatory Visit: Payer: Commercial Managed Care - HMO

## 2021-07-20 ENCOUNTER — Other Ambulatory Visit: Payer: Self-pay

## 2021-07-20 ENCOUNTER — Ambulatory Visit (INDEPENDENT_AMBULATORY_CARE_PROVIDER_SITE_OTHER): Payer: Commercial Managed Care - HMO

## 2021-07-20 VITALS — BP 142/86 | HR 70 | Ht 66.0 in | Wt 225.4 lb

## 2021-07-20 DIAGNOSIS — Z86711 Personal history of pulmonary embolism: Secondary | ICD-10-CM

## 2021-07-20 DIAGNOSIS — I77810 Thoracic aortic ectasia: Secondary | ICD-10-CM

## 2021-07-20 DIAGNOSIS — R5382 Chronic fatigue, unspecified: Secondary | ICD-10-CM | POA: Diagnosis not present

## 2021-07-20 DIAGNOSIS — E559 Vitamin D deficiency, unspecified: Secondary | ICD-10-CM | POA: Diagnosis not present

## 2021-07-20 DIAGNOSIS — E785 Hyperlipidemia, unspecified: Secondary | ICD-10-CM

## 2021-07-20 DIAGNOSIS — I4891 Unspecified atrial fibrillation: Secondary | ICD-10-CM | POA: Diagnosis not present

## 2021-07-20 DIAGNOSIS — I10 Essential (primary) hypertension: Secondary | ICD-10-CM

## 2021-07-20 DIAGNOSIS — Z5181 Encounter for therapeutic drug level monitoring: Secondary | ICD-10-CM | POA: Diagnosis not present

## 2021-07-20 DIAGNOSIS — I5032 Chronic diastolic (congestive) heart failure: Secondary | ICD-10-CM | POA: Diagnosis not present

## 2021-07-20 LAB — POCT INR: INR: 2 (ref 2.0–3.0)

## 2021-07-20 NOTE — Patient Instructions (Signed)
Medication Instructions:  Your physician recommends that you continue on your current medications as directed. Please refer to the Current Medication list given to you today.  *If you need a refill on your cardiac medications before your next appointment, please call your pharmacy*   Lab Work: TODAY:  CMP, VIT D, TSH, LIPID, & CBC  If you have labs (blood work) drawn today and your tests are completely normal, you will receive your results only by: [place] (if you have MyChart) OR A paper copy in the mail If you have any lab test that is abnormal or we need to change your treatment, we will call you to review the results.   Testing/Procedures: None ordered   Follow-Up: At [facility], you and your health needs are our priority.  As part of our continuing mission to provide you with exceptional heart care, we have created designated Provider Care Teams.  These Care Teams include your primary Cardiologist (physician) and Advanced Practice Providers (APPs -  Physician Assistants and Nurse Practitioners) who all work together to provide you with the care you need, when you need it.  We recommend signing up for the patient portal called "MyChart".  Sign up information is provided on this After Visit Summary.  MyChart is used to connect with patients for Virtual Visits (Telemedicine).  Patients are able to view lab/test results, encounter notes, upcoming appointments, etc.  Non-urgent messages can be sent to your provider as well.   To learn more about what you can do with MyChart, go to [email or web address].    Your next appointment:   12 month(s)  The format for your next appointment:   In Person  Provider:   Freada Bergeron, MD  or Melina Copa, PA-C         Other Instructions

## 2021-07-20 NOTE — Patient Instructions (Signed)
Description   Take 1.5 tablets today and then continue taking warfarin 1 tablet daily except for 1/2 on Tuesdays. Recheck INR in 5 weeks. Coumadin Clinic [phone].

## 2021-07-21 LAB — TSH: TSH: 0.933 u[IU]/mL (ref 0.450–4.500)

## 2021-07-21 LAB — CMP14+EGFR
ALT: 11 IU/L (ref 0–32)
AST: 15 IU/L (ref 0–40)
Albumin/Globulin Ratio: 1.4 (ref 1.2–2.2)
Albumin: 4.3 g/dL (ref 3.7–4.7)
Alkaline Phosphatase: 115 IU/L (ref 44–121)
BUN/Creatinine Ratio: 16 (ref 12–28)
BUN: 16 mg/dL (ref 8–27)
Bilirubin Total: 0.5 mg/dL (ref 0.0–1.2)
CO2: 27 mmol/L (ref 20–29)
Calcium: 9.4 mg/dL (ref 8.7–10.3)
Chloride: 98 mmol/L (ref 96–106)
Creatinine, Ser: 1.02 mg/dL — ABNORMAL HIGH (ref 0.57–1.00)
Globulin, Total: 3.1 g/dL (ref 1.5–4.5)
Glucose: 152 mg/dL — ABNORMAL HIGH (ref 70–99)
Potassium: 3.7 mmol/L (ref 3.5–5.2)
Sodium: 137 mmol/L (ref 134–144)
Total Protein: 7.4 g/dL (ref 6.0–8.5)
eGFR: 58 mL/min/{1.73_m2} — ABNORMAL LOW (ref >59)

## 2021-07-21 LAB — CBC
Hematocrit: 38.6 % (ref 34.0–46.6)
Hemoglobin: 13.1 g/dL (ref 11.1–15.9)
MCH: 26.1 pg — ABNORMAL LOW (ref 26.6–33.0)
MCHC: 33.9 g/dL (ref 31.5–35.7)
MCV: 77 fL — ABNORMAL LOW (ref 79–97)
Platelets: 343 10*3/uL (ref 150–450)
RBC: 5.02 x10E6/uL (ref 3.77–5.28)
RDW: 13.9 % (ref 11.7–15.4)
WBC: 7.1 10*3/uL (ref 3.4–10.8)

## 2021-07-21 LAB — LIPID PANEL
Chol/HDL Ratio: 3.8 ratio (ref 0.0–4.4)
Cholesterol, Total: 167 mg/dL (ref 100–199)
HDL: 44 mg/dL (ref >39)
LDL Chol Calc (NIH): 99 mg/dL (ref 0–99)
Triglycerides: 132 mg/dL (ref 0–149)
VLDL Cholesterol Cal: 24 mg/dL (ref 5–40)

## 2021-07-21 LAB — VITAMIN D 25 HYDROXY (VIT D DEFICIENCY, FRACTURES): Vit D, 25-Hydroxy: 47.5 ng/mL (ref 30.0–100.0)

## 2021-07-29 ENCOUNTER — Other Ambulatory Visit: Payer: Self-pay | Admitting: Family Medicine

## 2021-08-19 ENCOUNTER — Other Ambulatory Visit: Payer: Self-pay | Admitting: Family Medicine

## 2021-08-29 ENCOUNTER — Other Ambulatory Visit: Payer: Self-pay

## 2021-08-29 ENCOUNTER — Ambulatory Visit (INDEPENDENT_AMBULATORY_CARE_PROVIDER_SITE_OTHER): Payer: Medicare Other | Admitting: *Deleted

## 2021-08-29 DIAGNOSIS — Z5181 Encounter for therapeutic drug level monitoring: Secondary | ICD-10-CM | POA: Diagnosis not present

## 2021-08-29 DIAGNOSIS — I4891 Unspecified atrial fibrillation: Secondary | ICD-10-CM | POA: Diagnosis not present

## 2021-08-29 LAB — POCT INR: INR: 2.4 (ref 2.0–3.0)

## 2021-08-29 NOTE — Patient Instructions (Signed)
Description   Continue taking warfarin 1 tablet daily except for 1/2 on Tuesdays. Recheck INR in 6 weeks. Coumadin Clinic [phone].

## 2021-09-14 ENCOUNTER — Other Ambulatory Visit: Payer: Self-pay | Admitting: Family Medicine

## 2021-09-30 ENCOUNTER — Other Ambulatory Visit: Payer: Self-pay | Admitting: Family Medicine

## 2021-10-11 ENCOUNTER — Ambulatory Visit: Payer: Commercial Managed Care - HMO | Admitting: Family Medicine

## 2021-11-10 ENCOUNTER — Ambulatory Visit (INDEPENDENT_AMBULATORY_CARE_PROVIDER_SITE_OTHER): Payer: Medicare Other | Admitting: *Deleted

## 2021-11-10 DIAGNOSIS — I4891 Unspecified atrial fibrillation: Secondary | ICD-10-CM | POA: Diagnosis not present

## 2021-11-10 DIAGNOSIS — Z5181 Encounter for therapeutic drug level monitoring: Secondary | ICD-10-CM

## 2021-11-10 LAB — POCT INR: INR: 1.6 — AB (ref 2.0–3.0)

## 2021-11-10 NOTE — Patient Instructions (Signed)
Description   ?Today take 1.5 tablets then continue taking warfarin 1 tablet daily except for 1/2 on Tuesdays. Recheck INR in 4 weeks. Coumadin Clinic [phone].  ?  ?  ?

## 2021-11-27 DIAGNOSIS — S8991XA Unspecified injury of right lower leg, initial encounter: Secondary | ICD-10-CM | POA: Diagnosis not present

## 2021-11-27 DIAGNOSIS — M79671 Pain in right foot: Secondary | ICD-10-CM | POA: Diagnosis not present

## 2021-11-27 DIAGNOSIS — S93601A Unspecified sprain of right foot, initial encounter: Secondary | ICD-10-CM | POA: Diagnosis not present

## 2021-12-08 ENCOUNTER — Ambulatory Visit (INDEPENDENT_AMBULATORY_CARE_PROVIDER_SITE_OTHER): Payer: Medicare Other | Admitting: *Deleted

## 2021-12-08 DIAGNOSIS — Z5181 Encounter for therapeutic drug level monitoring: Secondary | ICD-10-CM

## 2021-12-08 DIAGNOSIS — I4891 Unspecified atrial fibrillation: Secondary | ICD-10-CM

## 2021-12-08 LAB — POCT INR: INR: 1.4 — AB (ref 2.0–3.0)

## 2021-12-08 NOTE — Patient Instructions (Addendum)
Description   Today and tomorrow take 1.5 tablets then continue taking warfarin 1 tablet daily except for 1/2 on Tuesdays. Recheck INR in 2 weeks. Coumadin Clinic [phone].

## 2021-12-21 ENCOUNTER — Other Ambulatory Visit: Payer: Self-pay | Admitting: Family Medicine

## 2021-12-27 ENCOUNTER — Ambulatory Visit (INDEPENDENT_AMBULATORY_CARE_PROVIDER_SITE_OTHER): Payer: Medicare Other

## 2021-12-27 DIAGNOSIS — Z5181 Encounter for therapeutic drug level monitoring: Secondary | ICD-10-CM

## 2021-12-27 DIAGNOSIS — I4891 Unspecified atrial fibrillation: Secondary | ICD-10-CM | POA: Diagnosis not present

## 2021-12-27 LAB — POCT INR: INR: 2.2 (ref 2.0–3.0)

## 2021-12-27 NOTE — Patient Instructions (Signed)
Description   Continue taking warfarin 1 tablet daily except for 1/2 on Tuesdays. Recheck INR in 3 weeks. Coumadin Clinic [phone].

## 2021-12-28 ENCOUNTER — Other Ambulatory Visit: Payer: Self-pay | Admitting: Physician Assistant

## 2022-01-02 ENCOUNTER — Other Ambulatory Visit: Payer: Self-pay | Admitting: Family Medicine

## 2022-01-08 ENCOUNTER — Other Ambulatory Visit: Payer: Self-pay | Admitting: Family Medicine

## 2022-01-08 ENCOUNTER — Other Ambulatory Visit: Payer: Self-pay | Admitting: Physician Assistant

## 2022-01-11 NOTE — Telephone Encounter (Signed)
Contacted the patient to clarify the correct  instructions for the potassium rx.   Awaiting a call back.

## 2022-01-11 NOTE — Telephone Encounter (Signed)
Hey Dayna what is the correct instructions for the potassium.

## 2022-01-18 NOTE — Telephone Encounter (Signed)
Contacted the patient to seek clarification on instruction for potassium rx.   Patient states she is taking  1 tab qd and an additional tab on days she takes torsemide.   Rx(s) sent to pharmacy electronically.  Patient agreeable and voiced understanding.

## 2022-01-25 NOTE — Progress Notes (Deleted)
Subjective:   Stephanie Singleton is a 76 y.o. female who presents for Medicare Annual (Subsequent) preventive examination.  I connected with  Stephanie Singleton on 01/25/22 by a audio enabled telemedicine application and verified that I am speaking with the correct person using two identifiers.  Patient Location: Home  Provider Location: Office/Clinic  I discussed the limitations of evaluation and management by telemedicine. The patient expressed understanding and agreed to proceed.  Review of Systems     [name] , Thank you for taking time to come for your Medicare Wellness Visit. I appreciate your ongoing commitment to your health goals. Please review the following plan we discussed and let me know if I can assist you in the future.   These are the goals we discussed:  Goals      eat 3 meals a day     maintaining      Patient states that she wants to stay happy and healthy the way that she is     Patient Stated     Would just like to keep going and living         This is a list of the screening recommended for you and due dates:  Health Maintenance  Topic Date Due   Zoster (Shingles) Vaccine (1 of 2) Never done   Pneumonia Vaccine (1 - PCV) Never done   Tetanus Vaccine  01/04/2015   Mammogram  04/26/2019   Complete foot exam   12/23/2021   Hemoglobin A1C  01/03/2022   Flu Shot  01/30/2022   Eye exam for diabetics  02/23/2022   Colon Cancer Screening  12/08/2023   DEXA scan (bone density measurement)  Completed   Hepatitis C Screening: USPSTF Recommendation to screen - Ages 69-79 yo.  Completed   HPV Vaccine  Aged Out   COVID-19 Vaccine  Discontinued          Objective:    There were no vitals filed for this visit. There is no height or weight on file to calculate BMI.     01/24/2021    3:20 PM 01/15/2018    1:17 PM 08/07/2016    2:33 PM 10/31/2014    1:56 PM 09/07/2014    1:10 PM 05/01/2011    3:07 PM  Advanced Directives  Does Patient Have a Medical  Advance Directive? No No No No No Patient does not have advance directive;Patient would not like information  Would patient like information on creating a medical advance directive? Yes (MAU/Ambulatory/Procedural Areas - Information given) No - Patient declined No - Patient declined No - patient declined information Yes - Educational materials given     Current Medications (verified) Outpatient Encounter Medications as of 01/25/2022  Medication Sig   ACCU-CHEK AVIVA PLUS test strip USE TO TEST ONCE DAILY   Accu-Chek FastClix Lancets MISC USE 1 UNIT 2 (TWO) TIMES A DAY.   amLODipine (NORVASC) 2.5 MG tablet Take 1 tablet (2.5 mg total) by mouth daily.   gabapentin (NEURONTIN) 300 MG capsule Take 1 capsule (300 mg total) by mouth 3 (three) times daily.   glipiZIDE (GLIPIZIDE XL) 2.5 MG 24 hr tablet Take 1 tablet (2.5 mg total) by mouth daily with breakfast.   glipiZIDE (GLUCOTROL XL) 5 MG 24 hr tablet TAKE 1 TABLET BY MOUTH EVERY DAY WITH BREAKFAST   hydrOXYzine (ATARAX) 50 MG tablet TAKE 1 TABLET BY MOUTH EVERYDAY AT BEDTIME   hydrOXYzine (VISTARIL) 50 MG capsule TAKE ONE TO TWO CAPSULES AT BEDTIME , AS  NEEDED, FOR SLEEP   olmesartan (BENICAR) 40 MG tablet TAKE 1 TABLET BY MOUTH EVERY DAY   olmesartan-hydrochlorothiazide (BENICAR HCT) 40-25 MG tablet Take 1 tablet by mouth daily.   omeprazole (PRILOSEC) 40 MG capsule TAKE 1 CAPSULE (40 MG TOTAL) BY MOUTH DAILY.   potassium chloride SA (KLOR-CON M20) 20 MEQ tablet TAKE 1 TABLET BY MOUTH DAILY, TAKE 1 EXTRA TABLET BY MOUTH DAILY ONLY ON DAYS YOU TAKE A TORSEMIDE   pravastatin (PRAVACHOL) 20 MG tablet TAKE 1 TABLET BY MOUTH EVERY DAY   torsemide (DEMADEX) 20 MG tablet TAKE 1 TABLET 2 TIMES A WEEK AS NEEDED FOR SWELLING. Please make overdue appt with Dr. Meda Coffee before anymore refills. 1st attempt   TRADJENTA 5 MG TABS tablet TAKE 1 TABLET BY MOUTH EVERY DAY   warfarin (COUMADIN) 2.5 MG tablet TAKE 1 TABLET BY MOUTH DAILY OR AS DIRECTED BY COUMADIN  CLINIC   No facility-administered encounter medications on file as of 01/25/2022.    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   Tumor of soft tissue of neck    Past Surgical History:  Procedure Laterality Date   CERVICAL FUSION     Family History  Problem Relation Age of Onset   Heart disease Mother    Hypertension Mother    Stroke Mother    Diabetes Father    Peripheral vascular disease Father    Peripheral vascular disease Brother    Heart disease Brother    Bladder Cancer Sister 84   Diabetes Daughter        43 of the 5 daughters   Diabetes Son        3 of the sons   Breast cancer Sister 39   Liver disease Son    Colon cancer Neg Hx    Pancreatic cancer Neg Hx    Stomach cancer Neg Hx    Esophageal cancer Neg Hx    Social History   Socioeconomic History   Marital status: Divorced    Spouse name: Not on file   Number of children: 7   Years of education: Not on file   Highest education level: Some college, no degree  Occupational History    Employer: DISABLED     Comment: [employer]  Tobacco Use   Smoking status: Former    Packs/day: 0.25    Years: 20.00    Total pack years: 5.00    Types: Cigarettes    Quit date: 08/07/1996    Years since quitting: 25.4   Smokeless tobacco: Never  Substance and Sexual  Activity   Alcohol use: No    Comment: upper limits of week, 1-2 daily- until 07/2018, no longer having alcohol per patient  Drug use: No   Sexual activity: Yes  Other Topics Concern   Not on file  Social History Narrative   Not on file   Social Determinants of Health   Financial Resource Strain: Low Risk  (01/24/2021)   Overall Financial Resource Strain (CARDIA)    Difficulty of Paying Living Expenses: Not hard at all  Food Insecurity: No Food Insecurity (01/24/2021)   Hunger Vital Sign    Worried About Running Out of Food in the Last Year: Never true    [place] in the Last Year: Never true  Transportation Needs: No Transportation Needs (01/24/2021)   PRAPARE - Hydrologist (Medical): No    Lack of Transportation (Non-Medical): No  Physical Activity: Sufficiently Active (01/24/2021)   Exercise Vital Sign    Days of Exercise per Week: 5 days    Minutes of Exercise per Session: 30 min  Stress: No Stress Concern Present (01/24/2021)   [place]    Feeling of Stress : Not at all  Social Connections: Moderately Integrated (01/24/2021)   Social Connection and Isolation Panel [NHANES]    Frequency of Communication with Friends and Family: More than three times a week    Frequency of Social Gatherings with Friends and Family: More than three times a week    Attends Religious Services: More than 4 times per year    Active Member of [employer] or Organizations: No    Attends Archivist Meetings: Never    Marital Status: Married    Tobacco Counseling Counseling given: Not Answered   Clinical Intake:                 Diabetic?***         Activities of Daily Living     No data to display           Patient Care Team: Fayrene Helper, MD as PCP - General Johney Frame, Greer Ee, MD as PCP - Cardiology (Cardiology) Rothbart, Cristopher Estimable, MD (Inactive)  (Cardiology) Trula Slade, DPM as Consulting Physician (Podiatry) Ashok Pall, MD as Consulting Physician (Neurosurgery)  Indicate any recent Medical Services you may have received from other than [facility] providers in the past year (date may be approximate).     Assessment:   This is a routine wellness examination for Stephanie Singleton.  Hearing/Vision screen No results found.  Dietary issues and exercise activities discussed:     Goals Addressed   None   Depression Screen    01/24/2021    3:21 PM 01/24/2021    3:19 PM 12/20/2020    2:53 PM 11/02/2020   10:15 AM 04/04/2020    2:08 PM 01/21/2020   10:58 AM 01/20/2019    1:13 PM  PHQ 2/9 Scores  PHQ - 2 Score 0 0 0 0 0 0 0  PHQ- 9 Score      0     Fall Risk    01/24/2021    3:21 PM 12/20/2020    2:53 PM 11/02/2020   10:15 AM 04/04/2020    2:08 PM 01/21/2020   10:57 AM  [place] in the past year? 0 0 0 0 0  Number falls in past yr: 0 0 0  0  Injury with Fall? 0 0 0  0  Risk for fall due to : No Fall Risks  No Fall Risks  No Fall Risks  Follow up Falls  evaluation completed  Falls evaluation completed  Falls evaluation completed    FALL RISK PREVENTION PERTAINING TO THE HOME:  Any stairs in or around the home? {YES/NO:21197} If so, are there any without handrails? {YES/NO:21197} Home free of loose throw rugs in walkways, pet beds, electrical cords, etc? {YES/NO:21197} Adequate lighting in your home to reduce risk of falls? {YES/NO:21197}  ASSISTIVE DEVICES UTILIZED TO PREVENT FALLS:  Life alert? {YES/NO:21197} Use of a cane, walker or w/c? {YES/NO:21197} Grab bars in the bathroom? {YES/NO:21197} Shower chair or bench in shower? {YES/NO:21197} Elevated toilet seat or a handicapped toilet? {YES/NO:21197}   Cognitive Function:    01/24/2021    3:22 PM  MMSE - Mini Mental State Exam  Not completed: Unable to complete        01/24/2021    3:22 PM 01/21/2020   10:53 AM 01/20/2019    1:15 PM 01/15/2018    1:20 PM  08/07/2016    2:35 PM  6CIT Screen  What Year? 0 points 0 points 0 points 0 points 0 points  What month? 0 points 0 points 0 points 0 points 0 points  What time? 0 points 0 points 0 points 0 points 0 points  Count back from 20 0 points 0 points 0 points 0 points 0 points  Months in reverse 0 points 0 points 0 points 4 points 0 points  Repeat phrase 0 points 0 points 0 points 0 points 0 points  Total Score 0 points 0 points 0 points 4 points 0 points    Immunizations Immunization History  Administered Date(s) Administered   Td 01/03/2005    {TDAP status:2101805}  Flu Vaccine status: Up to date  {Pneumococcal vaccine status:2101807}  {Covid-19 vaccine status:2101808}  Qualifies for Shingles Vaccine? Yes   Zostavax completed {YES/NO:21197}  {Shingrix Completed?:2101804}  Screening Tests Health Maintenance  Topic Date Due   Zoster Vaccines- Shingrix (1 of 2) Never done   Pneumonia Vaccine 85+ Years old (1 - PCV) Never done   TETANUS/TDAP  01/04/2015   MAMMOGRAM  04/26/2019   FOOT EXAM  12/23/2021   HEMOGLOBIN A1C  01/03/2022   INFLUENZA VACCINE  01/30/2022   OPHTHALMOLOGY EXAM  02/23/2022   COLONOSCOPY (Pts 45-33yr Insurance coverage will need to be confirmed)  12/08/2023   DEXA SCAN  Completed   Hepatitis C Screening  Completed   HPV VACCINES  Aged Out   COVID-19 Vaccine  Discontinued    Health Maintenance  Health Maintenance Due  Topic Date Due   Zoster Vaccines- Shingrix (1 of 2) Never done   Pneumonia Vaccine 74 Years old (1 - PCV) Never done   TETANUS/TDAP  01/04/2015   MAMMOGRAM  04/26/2019   FOOT EXAM  12/23/2021   HEMOGLOBIN A1C  01/03/2022    Colorectal cancer screening: Type of screening: Colonoscopy. Completed 12/07/2013. Repeat every 10 years  {Mammogram status:21018020}  Bone Density status: Completed 11/06/2013. Results reflect: Bone density results: OSTEOPOROSIS. Repeat every 3 years.  Lung Cancer Screening: (Low Dose CT Chest recommended  if Age 74-80years, 30 pack-year currently smoking OR have quit w/in 15years.) does not qualify.   Additional Screening:  Hepatitis C Screening: does qualify; Completed 09/28/2014  Vision Screening: Recommended annual ophthalmology exams for early detection of glaucoma and other disorders of the eye. Is the patient up to date with their annual eye exam?  Yes  Who is the provider or what is the name of the office in which the patient attends annual eye exams? ***  Dental Screening: Recommended annual dental exams for proper oral hygiene  Community Resource Referral / Chronic Care Management: CRR required this visit?  No   CCM required this visit?  No      Plan:     I have personally reviewed and noted the following in the patient's chart:   Medical and social history Use of alcohol, tobacco or illicit drugs  Current medications and supplements including opioid prescriptions.  Functional ability and status Nutritional status Physical activity Advanced directives List of other physicians Hospitalizations, surgeries, and ER visits in previous 12 months Vitals Screenings to include cognitive, depression, and falls Referrals and appointments  In addition, I have reviewed and discussed with patient certain preventive protocols, quality metrics, and best practice recommendations. A written personalized care plan for preventive services as well as general preventive health recommendations were provided to patient.     Johny Drilling, [place]   01/25/2022   Nurse Notes:  Ms. Deyo , Thank you for taking time to come for your Medicare Wellness Visit. I appreciate your ongoing commitment to your health goals. Please review the following plan we discussed and let me know if I can assist you in the future.   These are the goals we discussed:  Goals      eat 3 meals a day     maintaining      Patient states that she wants to stay happy and healthy the way that she is      Patient Stated     Would just like to keep going and living         This is a list of the screening recommended for you and due dates:  Health Maintenance  Topic Date Due   Zoster (Shingles) Vaccine (1 of 2) Never done   Pneumonia Vaccine (1 - PCV) Never done   Tetanus Vaccine  01/04/2015   Mammogram  04/26/2019   Complete foot exam   12/23/2021   Hemoglobin A1C  01/03/2022   Flu Shot  01/30/2022   Eye exam for diabetics  02/23/2022   Colon Cancer Screening  12/08/2023   DEXA scan (bone density measurement)  Completed   Hepatitis C Screening: USPSTF Recommendation to screen - Ages 14-79 yo.  Completed   HPV Vaccine  Aged Out   COVID-19 Vaccine  Discontinued        This encounter was created in error - please disregard.

## 2022-02-01 ENCOUNTER — Ambulatory Visit (INDEPENDENT_AMBULATORY_CARE_PROVIDER_SITE_OTHER): Payer: Medicare Other

## 2022-02-01 DIAGNOSIS — I4891 Unspecified atrial fibrillation: Secondary | ICD-10-CM | POA: Diagnosis not present

## 2022-02-01 DIAGNOSIS — Z5181 Encounter for therapeutic drug level monitoring: Secondary | ICD-10-CM | POA: Diagnosis not present

## 2022-02-01 LAB — POCT INR: INR: 2.3 (ref 2.0–3.0)

## 2022-02-01 NOTE — Patient Instructions (Signed)
Continue taking warfarin 1 tablet daily except for 1/2 on Tuesdays. Recheck INR in 8 weeks. [facility] [phone].

## 2022-02-15 ENCOUNTER — Encounter: Payer: Self-pay | Admitting: Family Medicine

## 2022-02-15 ENCOUNTER — Ambulatory Visit (INDEPENDENT_AMBULATORY_CARE_PROVIDER_SITE_OTHER): Payer: Medicare Other | Admitting: Family Medicine

## 2022-02-15 VITALS — BP 150/80 | HR 63 | Resp 16 | Ht 66.0 in | Wt 218.0 lb

## 2022-02-15 DIAGNOSIS — E1121 Type 2 diabetes mellitus with diabetic nephropathy: Secondary | ICD-10-CM

## 2022-02-15 DIAGNOSIS — Z1231 Encounter for screening mammogram for malignant neoplasm of breast: Secondary | ICD-10-CM | POA: Diagnosis not present

## 2022-02-15 DIAGNOSIS — E1159 Type 2 diabetes mellitus with other circulatory complications: Secondary | ICD-10-CM

## 2022-02-15 DIAGNOSIS — I10 Essential (primary) hypertension: Secondary | ICD-10-CM

## 2022-02-15 DIAGNOSIS — E785 Hyperlipidemia, unspecified: Secondary | ICD-10-CM

## 2022-02-15 DIAGNOSIS — E559 Vitamin D deficiency, unspecified: Secondary | ICD-10-CM | POA: Diagnosis not present

## 2022-02-15 LAB — POCT GLYCOSYLATED HEMOGLOBIN (HGB A1C): HbA1c, POC (controlled diabetic range): 6.6 % (ref 0.0–7.0)

## 2022-02-15 NOTE — Patient Instructions (Addendum)
Annual exam in 3 months, call if you need me sooner, also re evaluate blood pressure  Pt requests annual wellness visit be changed to virtual  CBC, lipid, cmp and eGFR , tSH and vit D today, also microalb   NEED TO START TAKING AMLODIPINE 2.5 MG EVERY DAY, CONTINUE OLMESArtan/hctz, your blood pressure is too high  Pls schedule mammogram at [facility] in October or shortly after per pt request  You are referred to podiatry, [facility] re foot pain and for exam  Congrats on excellent blood sugar   Condolence on recent loss  Thanks for choosing [facility], we consider it a privelige to serve you.

## 2022-02-19 ENCOUNTER — Ambulatory Visit (INDEPENDENT_AMBULATORY_CARE_PROVIDER_SITE_OTHER): Payer: Medicare Other

## 2022-02-19 ENCOUNTER — Encounter: Payer: Self-pay | Admitting: Family Medicine

## 2022-02-19 DIAGNOSIS — Z0000 Encounter for general adult medical examination without abnormal findings: Secondary | ICD-10-CM | POA: Diagnosis not present

## 2022-02-19 NOTE — Assessment & Plan Note (Signed)
Controlled, no change in medication Stephanie Singleton is reminded of the importance of commitment to daily physical activity for 30 minutes or more, as able and the need to limit carbohydrate intake to 30 to 60 grams per meal to help with blood sugar control.   The need to take medication as prescribed, test blood sugar as directed, and to call between visits if there is a concern that blood sugar is uncontrolled is also discussed.   Stephanie Singleton is reminded of the importance of daily foot exam, annual eye examination, and good blood sugar, blood pressure and cholesterol control.     Latest Ref Rng & Units 02/15/2022    3:18 PM 07/20/2021   12:00 AM 07/06/2021    4:37 PM 12/20/2020    3:33 PM 12/16/2020    1:34 PM  Diabetic Labs  HbA1c 0.0 - 7.0 % 6.6   7.4  7.5    Chol 100 - 199 mg/dL  167      HDL >39 mg/dL  44      Calc LDL 0 - 99 mg/dL  99      Triglycerides 0 - 149 mg/dL  132      Creatinine 0.57 - 1.00 mg/dL  1.02    1.19       02/15/2022    2:13 PM 02/15/2022    2:12 PM 02/15/2022    1:28 PM 07/20/2021    1:47 PM 07/04/2021    2:20 PM 07/04/2021    1:59 PM 12/20/2020    2:51 PM  BP/Weight  Systolic BP 956 387 564 332 951 884 166  Diastolic BP 80 80 90 86 80 85 81  Wt. (Lbs)   218 225.4  224 219  BMI   35.19 kg/m2 36.38 kg/m2  36.15 kg/m2 35.35 kg/m2      Latest Ref Rng & Units 02/15/2022    1:20 PM 02/23/2021   12:00 AM  Foot/eye exam completion dates  Eye Exam No Retinopathy  Retinopathy      Foot Form Completion  Done      This result is from an external source.

## 2022-02-19 NOTE — Assessment & Plan Note (Signed)
Hyperlipidemia:Low fat diet discussed and encouraged.   Lipid Panel  Lab Results  Component Value Date   CHOL 167 07/20/2021   HDL 44 07/20/2021   LDLCALC 99 07/20/2021   TRIG 132 07/20/2021   CHOLHDL 3.8 07/20/2021     Updated lab needed at/ before next visit.

## 2022-02-19 NOTE — Assessment & Plan Note (Signed)
  Patient re-educated about  the importance of commitment to a  minimum of 150 minutes of exercise per week as able.  The importance of healthy food choices with portion control discussed, as well as eating regularly and within a 12 hour window most days. The need to choose "clean , green" food 50 to 75% of the time is discussed, as well as to make water the primary drink and set a goal of 64 ounces water daily.       02/15/2022    1:28 PM 07/20/2021    1:47 PM 07/04/2021    1:59 PM  Weight /BMI  Weight 218 lb 225 lb 6.4 oz 224 lb  Height '5\' 6"'$  (1.676 m) '5\' 6"'$  (1.676 m) '5\' 6"'$  (1.676 m)  BMI 35.19 kg/m2 36.38 kg/m2 36.15 kg/m2

## 2022-02-19 NOTE — Patient Instructions (Signed)
  Stephanie Singleton , Thank you for taking time to come for your Medicare Wellness Visit. I appreciate your ongoing commitment to your health goals. Please review the following plan we discussed and let me know if I can assist you in the future.   These are the goals we discussed:  Goals      eat 3 meals a day     maintaining      Patient states that she wants to stay happy and healthy the way that she is     Patient Stated     Would just like to keep going and living         This is a list of the screening recommended for you and due dates:  Health Maintenance  Topic Date Due   Zoster (Shingles) Vaccine (1 of 2) Never done   Pneumonia Vaccine (1 - PCV) Never done   Tetanus Vaccine  01/04/2015   Mammogram  04/26/2019   Flu Shot  09/30/2022*   Eye exam for diabetics  02/23/2022   Hemoglobin A1C  08/18/2022   Complete foot exam   02/16/2023   Colon Cancer Screening  12/08/2023   DEXA scan (bone density measurement)  Completed   Hepatitis C Screening: USPSTF Recommendation to screen - Ages 4-79 yo.  Completed   HPV Vaccine  Aged Out   COVID-19 Vaccine  Discontinued  *Topic was postponed. The date shown is not the original due date.

## 2022-02-19 NOTE — Progress Notes (Signed)
Stephanie Singleton     MRN: [id number]      DOB: 1945/08/08   HPI Stephanie Singleton is here for follow up and re-evaluation of chronic medical conditions, medication management and review of any available recent lab and radiology data.  Preventive health is updated, specifically  Cancer screening and Immunization.  Still deferring with regard to immunization and some cancer screening Lost Stephanie Singleton husband 3 weeks ago and is currently dealing with this, though he had been ill Stephanie Singleton grief and loneliness is real,  Stephanie Singleton children are supportive Weight loss due to reduced appetite  ROS Denies recent fever or chills. Denies sinus pressure, nasal congestion, ear pain or sore throat. Denies chest congestion, productive cough or wheezing. Denies chest pains, palpitations and leg swelling Denies abdominal pain, nausea, vomiting,diarrhea or constipation.   Denies dysuria, frequency, hesitancy or incontinence. C/o foot pain and callous Denies headaches, seizures, numbness, or tingling. Denies  uncontrolled depression, anxiety or insomnia. Denies skin break down or rash.   PE  BP (!) 150/80   Pulse 63   Resp 16   Ht '5\' 6"'$  (1.676 m)   Wt 218 lb (98.9 kg)   SpO2 95%   BMI 35.19 kg/m   Patient alert and oriented and in no cardiopulmonary distress.  HEENT: No facial asymmetry, EOMI,     Neck supple .  Chest: Clear to auscultation bilaterally.  CVS: S1, S2 no murmurs, no S3.Regular rate.  ABD: Soft non tender.   Ext: No edema  MS: Adequate ROM spine, shoulders, hips and knees.  Skin: Intact, no ulcerations or rash noted.  Psych: Good eye contact, normal affect. Memory intact not anxious or depressed appearing.  CNS: CN 2-12 intact, power,  normal throughout.no focal deficits noted.   Assessment & Plan  Hypertension goal BP (blood pressure) < 130/80 Uncontrolled , resume amlodipine 2.5 mg , re eval in 8 week DASH diet and commitment to daily physical activity for a minimum of 30 minutes  discussed and encouraged, as a part of hypertension management. The importance of attaining a healthy weight is also discussed.     02/15/2022    2:13 PM 02/15/2022    2:12 PM 02/15/2022    1:28 PM 07/20/2021    1:47 PM 07/04/2021    2:20 PM 07/04/2021    1:59 PM 12/20/2020    2:51 PM  BP/Weight  Systolic BP 149 702 637 858 850 277 412  Diastolic BP 80 80 90 86 80 85 81  Wt. (Lbs)   218 225.4  224 219  BMI   35.19 kg/m2 36.38 kg/m2  36.15 kg/m2 35.35 kg/m2       Type 2 diabetes with nephropathy (HCC) Controlled, no change in medication Stephanie Singleton is reminded of the importance of commitment to daily physical activity for 30 minutes or more, as able and the need to limit carbohydrate intake to 30 to 60 grams per meal to help with blood sugar control.   The need to take medication as prescribed, test blood sugar as directed, and to call between visits if there is a concern that blood sugar is uncontrolled is also discussed.   Stephanie Singleton is reminded of the importance of daily foot exam, annual eye examination, and good blood sugar, blood pressure and cholesterol control.     Latest Ref Rng & Units 02/15/2022    3:18 PM 07/20/2021   12:00 AM 07/06/2021    4:37 PM 12/20/2020    3:33 PM 12/16/2020  1:34 PM  Diabetic Labs  HbA1c 0.0 - 7.0 % 6.6   7.4  7.5    Chol 100 - 199 mg/dL  167      HDL >39 mg/dL  44      Calc LDL 0 - 99 mg/dL  99      Triglycerides 0 - 149 mg/dL  132      Creatinine 0.57 - 1.00 mg/dL  1.02    1.19       02/15/2022    2:13 PM 02/15/2022    2:12 PM 02/15/2022    1:28 PM 07/20/2021    1:47 PM 07/04/2021    2:20 PM 07/04/2021    1:59 PM 12/20/2020    2:51 PM  BP/Weight  Systolic BP 826 415 830 940 768 088 110  Diastolic BP 80 80 90 86 80 85 81  Wt. (Lbs)   218 225.4  224 219  BMI   35.19 kg/m2 36.38 kg/m2  36.15 kg/m2 35.35 kg/m2      Latest Ref Rng & Units 02/15/2022    1:20 PM 02/23/2021   12:00 AM  Foot/eye exam completion dates  Eye Exam No Retinopathy   Retinopathy      Foot Form Completion  Done      This result is from an external source.        Morbid obesity ([place])  Patient re-educated about  the importance of commitment to a  minimum of 150 minutes of exercise per week as able.  The importance of healthy food choices with portion control discussed, as well as eating regularly and within a 12 hour window most days. The need to choose "clean , green" food 50 to 75% of the time is discussed, as well as to make water the primary drink and set a goal of 64 ounces water daily.       02/15/2022    1:28 PM 07/20/2021    1:47 PM 07/04/2021    1:59 PM  Weight /BMI  Weight 218 lb 225 lb 6.4 oz 224 lb  Height '5\' 6"'$  (1.676 m) '5\' 6"'$  (1.676 m) '5\' 6"'$  (1.676 m)  BMI 35.19 kg/m2 36.38 kg/m2 36.15 kg/m2      Hyperlipidemia LDL goal <100 Hyperlipidemia:Low fat diet discussed and encouraged.   Lipid Panel  Lab Results  Component Value Date   CHOL 167 07/20/2021   HDL 44 07/20/2021   LDLCALC 99 07/20/2021   TRIG 132 07/20/2021   CHOLHDL 3.8 07/20/2021     Updated lab needed at/ before next visit.

## 2022-02-19 NOTE — Progress Notes (Signed)
Subjective:   Stephanie Singleton is a 76 y.o. female who presents for Medicare Annual (Subsequent) preventive examination. I connected with  Magdelena Sensing on 02/19/22 by a audio enabled telemedicine application and verified that I am speaking with the correct person using two identifiers.  Patient Location: Home  Provider Location: Office/Clinic  I discussed the limitations of evaluation and management by telemedicine. The patient expressed understanding and agreed to proceed.  Review of Systems           Objective:    There were no vitals filed for this visit. There is no height or weight on file to calculate BMI.     01/24/2021    3:20 PM 01/15/2018    1:17 PM 08/07/2016    2:33 PM 10/31/2014    1:56 PM 09/07/2014    1:10 PM 05/01/2011    3:07 PM  Advanced Directives  Does Patient Have a Medical Advance Directive? No No No No No Patient does not have advance directive;Patient would not like information  Would patient like information on creating a medical advance directive? Yes (MAU/Ambulatory/Procedural Areas - Information given) No - Patient declined No - Patient declined No - patient declined information Yes - Educational materials given     Current Medications (verified) Outpatient Encounter Medications as of 02/19/2022  Medication Sig   ACCU-CHEK AVIVA PLUS test strip USE TO TEST ONCE DAILY   Accu-Chek FastClix Lancets MISC USE 1 UNIT 2 (TWO) TIMES A DAY.   gabapentin (NEURONTIN) 300 MG capsule Take 1 capsule (300 mg total) by mouth 3 (three) times daily.   hydrOXYzine (VISTARIL) 50 MG capsule TAKE ONE TO TWO CAPSULES AT BEDTIME , AS NEEDED, FOR SLEEP   olmesartan-hydrochlorothiazide (BENICAR HCT) 40-25 MG tablet Take 1 tablet by mouth daily.   omeprazole (PRILOSEC) 40 MG capsule TAKE 1 CAPSULE (40 MG TOTAL) BY MOUTH DAILY.   potassium chloride SA (KLOR-CON M20) 20 MEQ tablet TAKE 1 TABLET BY MOUTH DAILY, TAKE 1 EXTRA TABLET BY MOUTH DAILY ONLY ON DAYS YOU TAKE A  TORSEMIDE   pravastatin (PRAVACHOL) 20 MG tablet TAKE 1 TABLET BY MOUTH EVERY DAY   torsemide (DEMADEX) 20 MG tablet TAKE 1 TABLET 2 TIMES A WEEK AS NEEDED FOR SWELLING. Please make overdue appt with Dr. Meda Coffee before anymore refills. 1st attempt   TRADJENTA 5 MG TABS tablet TAKE 1 TABLET BY MOUTH EVERY DAY   warfarin (COUMADIN) 2.5 MG tablet TAKE 1 TABLET BY MOUTH DAILY OR AS DIRECTED BY COUMADIN CLINIC   No facility-administered encounter medications on file as of 02/19/2022.    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   Tumor of soft tissue of neck    Past Surgical History:  Procedure Laterality Date   CERVICAL FUSION     Family History  Problem Relation Age of Onset   Heart disease Mother    Hypertension Mother    Stroke Mother    Diabetes Father    Peripheral vascular disease Father    Peripheral vascular disease Brother    Heart disease Brother    Bladder Cancer Sister 13   Diabetes Daughter        52 of the 5 daughters   Diabetes Son        61 of the sons   Breast cancer Sister 6   Liver disease Son    Colon cancer Neg Hx    Pancreatic cancer Neg Hx    Stomach cancer Neg Hx    Esophageal cancer Neg Hx    Social History   Socioeconomic History   Marital status: Divorced    Spouse name: Not on file    Number of children: 76   Years of education: Not on file   Highest education level: Some college, no degree  Occupational History    Employer: DISABLED     Comment: [employer]  Tobacco Use   Smoking status: Former    Packs/day: 0.25    Years: 20.00    Total pack years: 5.00    Types: Cigarettes    Quit date: 08/07/1996    Years since quitting: 25.5   Smokeless tobacco: Never  Substance and Sexual Activity   Alcohol use: No    Comment: upper limits of week, 1-2 daily- until 07/2018, no longer having alcohol per patient   Drug use: No   Sexual activity: Yes  Other Topics Concern   Not on file  Social History Narrative   Not on file   Social Determinants of Health   Financial Resource Strain: Low Risk  (01/24/2021)   Overall Financial Resource Strain (CARDIA)    Difficulty of Paying Living Expenses: Not hard at all  Food Insecurity: No Food Insecurity (01/24/2021)   Hunger Vital Sign    Worried About Running Out of Food in the Last Year: Never true    Ran Out of Food in the Last Year: Never true  Transportation Needs: No Transportation Needs (01/24/2021)   PRAPARE - Hydrologist (Medical): No    Lack of Transportation (Non-Medical): No  Physical Activity: Sufficiently Active (01/24/2021)   Exercise Vital Sign    Days of Exercise per Week: 5 days    Minutes of Exercise per Session: 30 min  Stress: No Stress Concern Present (01/24/2021)   [place]    Feeling of Stress : Not at all  Social Connections: Moderately Integrated (01/24/2021)   Social Connection and Isolation Panel [NHANES]    Frequency of Communication with Friends and Family: More than three times a week    Frequency of Social Gatherings with Friends and Family: More than three times a week    Attends Religious Services: More than 4 times per year    Active Member of [employer] or Organizations: No    Attends Archivist  Meetings: Never    Marital Status: Married    Tobacco Counseling Counseling given: Not Answered   Clinical Intake:                 Diabetic?yes  Nutrition Risk Assessment:  Has the patient had any N/V/D within the last 2 months?  No  Does the patient have any  non-healing wounds?  No  Has the patient had any unintentional weight loss or weight gain?  No   Diabetes:  Is the patient diabetic?  Yes  If diabetic, was a CBG obtained today?  No  Did the patient bring in their glucometer from home?  No  How often do you monitor your CBG's? Twice daily.   Financial Strains and Diabetes Management:  Are you having any financial strains with the device, your supplies or your medication? No .  Does the patient want to be seen by Chronic Care Management for management of their diabetes?  No  Would the patient like to be referred to a Nutritionist or for Diabetic Management?  No   Diabetic Exams:  Diabetic Eye Exam: Completed 02/23/21 Diabetic Foot Exam: Completed 02/15/22           Activities of Daily Living     No data to display          Patient Care Team: Fayrene Helper, MD as PCP - General Johney Frame, Greer Ee, MD as PCP - Cardiology (Cardiology) Rothbart, Cristopher Estimable, MD (Inactive) (Cardiology) Trula Slade, DPM as Consulting Physician (Podiatry) Ashok Pall, MD as Consulting Physician (Neurosurgery)  Indicate any recent Medical Services you may have received from other than [facility] providers in the past year (date may be approximate).     Assessment:   This is a routine wellness examination for [place].  Hearing/Vision screen No results found.  Dietary issues and exercise activities discussed:     Goals Addressed   None    Depression Screen    02/15/2022    1:29 PM 01/24/2021    3:21 PM 01/24/2021    3:19 PM 12/20/2020    2:53 PM 11/02/2020   10:15 AM 04/04/2020    2:08 PM 01/21/2020   10:58 AM  PHQ 2/9 Scores  PHQ - 2 Score 0 0 0 0 0  0 0  PHQ- 9 Score       0    Fall Risk    02/15/2022    1:29 PM 01/24/2021    3:21 PM 12/20/2020    2:53 PM 11/02/2020   10:15 AM 04/04/2020    2:08 PM  [place] in the past year? 0 0 0 0 0  Number falls in past yr: 0 0 0 0   Injury with Fall? 0 0 0 0   Risk for fall due to :  No Fall Risks  No Fall Risks   Follow up  Falls evaluation completed  Falls evaluation completed     [place]:  Any stairs in or around the home? No  If so, are there any without handrails? No  Home free of loose throw rugs in walkways, pet beds, electrical cords, etc? Yes  Adequate lighting in your home to reduce risk of falls? Yes   ASSISTIVE DEVICES UTILIZED TO PREVENT FALLS:  Life alert? No  Use of a cane, walker or w/c? No  Grab bars in the bathroom? Yes  Shower chair or bench in shower? No  Elevated toilet seat or a handicapped toilet? Yes   TIMED UP AND GO:  Was the test performed? No .  Length of time to ambulate 10 feet:  sec.     Cognitive Function:    01/24/2021    3:22 PM  MMSE - Mini Mental State Exam  Not completed: Unable to complete  01/24/2021    3:22 PM 01/21/2020   10:53 AM 01/20/2019    1:15 PM 01/15/2018    1:20 PM 08/07/2016    2:35 PM  6CIT Screen  What Year? 0 points 0 points 0 points 0 points 0 points  What month? 0 points 0 points 0 points 0 points 0 points  What time? 0 points 0 points 0 points 0 points 0 points  Count back from 20 0 points 0 points 0 points 0 points 0 points  Months in reverse 0 points 0 points 0 points 4 points 0 points  Repeat phrase 0 points 0 points 0 points 0 points 0 points  Total Score 0 points 0 points 0 points 4 points 0 points    Immunizations Immunization History  Administered Date(s) Administered   Td 01/03/2005    TDAP status: Due, Education has been provided regarding the importance of this vaccine. Advised may receive this vaccine at local pharmacy or Health Dept. Aware to  provide a copy of the vaccination record if obtained from local pharmacy or Health Dept. Verbalized acceptance and understanding.  Flu Vaccine status: Due, Education has been provided regarding the importance of this vaccine. Advised may receive this vaccine at local pharmacy or Health Dept. Aware to provide a copy of the vaccination record if obtained from local pharmacy or Health Dept. Verbalized acceptance and understanding.  Pneumococcal vaccine status: Due, Education has been provided regarding the importance of this vaccine. Advised may receive this vaccine at local pharmacy or Health Dept. Aware to provide a copy of the vaccination record if obtained from local pharmacy or Health Dept. Verbalized acceptance and understanding.  Covid-19 vaccine status: Information provided on how to obtain vaccines.   Qualifies for Shingles Vaccine? Yes   Zostavax completed No   Shingrix Completed?: No.    Education has been provided regarding the importance of this vaccine. Patient has been advised to call insurance company to determine out of pocket expense if they have not yet received this vaccine. Advised may also receive vaccine at local pharmacy or Health Dept. Verbalized acceptance and understanding.  Screening Tests Health Maintenance  Topic Date Due   Zoster Vaccines- Shingrix (1 of 2) Never done   Pneumonia Vaccine 75+ Years old (1 - PCV) Never done   TETANUS/TDAP  01/04/2015   MAMMOGRAM  04/26/2019   INFLUENZA VACCINE  09/30/2022 (Originally 01/30/2022)   OPHTHALMOLOGY EXAM  02/23/2022   HEMOGLOBIN A1C  08/18/2022   FOOT EXAM  02/16/2023   COLONOSCOPY (Pts 45-50yr Insurance coverage will need to be confirmed)  12/08/2023   DEXA SCAN  Completed   Hepatitis C Screening  Completed   HPV VACCINES  Aged Out   COVID-19 Vaccine  Discontinued    Health Maintenance  Health Maintenance Due  Topic Date Due   Zoster Vaccines- Shingrix (1 of 2) Never done   Pneumonia Vaccine 74 Years old (1 -  PCV) Never done   TETANUS/TDAP  01/04/2015   MAMMOGRAM  04/26/2019    Colorectal cancer screening: Type of screening: Cologuard. Completed 12/07/13. Repeat every 10 years  Mammogram status: Ordered  . Pt provided with contact info and advised to call to schedule appt.     Lung Cancer Screening: (Low Dose CT Chest recommended if Age 74-80years, 30 pack-year currently smoking OR have quit w/in 15years.) does not qualify.   Lung Cancer Screening Referral:   Additional Screening:  Hepatitis C Screening: does not qualify; Completed 09/28/14  Vision Screening: Recommended annual  ophthalmology exams for early detection of glaucoma and other disorders of the eye. Is the patient up to date with their annual eye exam?  Yes  Who is the provider or what is the name of the office in which the patient attends annual eye exams? Dr Vickii Penna If pt is not established with a provider, would they like to be referred to a provider to establish care? No .   Dental Screening: Recommended annual dental exams for proper oral hygiene  Community Resource Referral / Chronic Care Management: CRR required this visit?  No   CCM required this visit?  No      Plan:     I have personally reviewed and noted the following in the patient's chart:   Medical and social history Use of alcohol, tobacco or illicit drugs  Current medications and supplements including opioid prescriptions. Patient is not currently taking opioid prescriptions. Functional ability and status Nutritional status Physical activity Advanced directives List of other physicians Hospitalizations, surgeries, and ER visits in previous 12 months Vitals Screenings to include cognitive, depression, and falls Referrals and appointments  In addition, I have reviewed and discussed with patient certain preventive protocols, quality metrics, and best practice recommendations. A written personalized care plan for preventive services as well as  general preventive health recommendations were provided to patient.     Jill Side, [place]   02/19/2022   Nurse Notes:

## 2022-02-19 NOTE — Assessment & Plan Note (Signed)
Uncontrolled , resume amlodipine 2.5 mg , re eval in 8 week DASH diet and commitment to daily physical activity for a minimum of 30 minutes discussed and encouraged, as a part of hypertension management. The importance of attaining a healthy weight is also discussed.     02/15/2022    2:13 PM 02/15/2022    2:12 PM 02/15/2022    1:28 PM 07/20/2021    1:47 PM 07/04/2021    2:20 PM 07/04/2021    1:59 PM 12/20/2020    2:51 PM  BP/Weight  Systolic BP 161 096 045 409 [phone]  Diastolic BP 80 80 90 86 80 85 81  Wt. (Lbs)   218 225.4  224 219  BMI   35.19 kg/m2 36.38 kg/m2  36.15 kg/m2 35.35 kg/m2

## 2022-02-20 ENCOUNTER — Other Ambulatory Visit: Payer: Medicare Other

## 2022-02-26 ENCOUNTER — Ambulatory Visit (INDEPENDENT_AMBULATORY_CARE_PROVIDER_SITE_OTHER): Payer: Medicare Other

## 2022-02-26 ENCOUNTER — Ambulatory Visit (INDEPENDENT_AMBULATORY_CARE_PROVIDER_SITE_OTHER): Payer: Medicare Other | Admitting: Podiatry

## 2022-02-26 ENCOUNTER — Ambulatory Visit: Payer: Medicare Other | Attending: Family Medicine

## 2022-02-26 DIAGNOSIS — I10 Essential (primary) hypertension: Secondary | ICD-10-CM | POA: Diagnosis not present

## 2022-02-26 DIAGNOSIS — R0989 Other specified symptoms and signs involving the circulatory and respiratory systems: Secondary | ICD-10-CM | POA: Diagnosis not present

## 2022-02-26 DIAGNOSIS — R29898 Other symptoms and signs involving the musculoskeletal system: Secondary | ICD-10-CM | POA: Diagnosis not present

## 2022-02-26 DIAGNOSIS — M79672 Pain in left foot: Secondary | ICD-10-CM

## 2022-02-26 DIAGNOSIS — E785 Hyperlipidemia, unspecified: Secondary | ICD-10-CM | POA: Diagnosis not present

## 2022-02-26 DIAGNOSIS — M7752 Other enthesopathy of left foot: Secondary | ICD-10-CM | POA: Diagnosis not present

## 2022-02-26 DIAGNOSIS — E1159 Type 2 diabetes mellitus with other circulatory complications: Secondary | ICD-10-CM | POA: Diagnosis not present

## 2022-02-26 DIAGNOSIS — M722 Plantar fascial fibromatosis: Secondary | ICD-10-CM | POA: Diagnosis not present

## 2022-02-26 DIAGNOSIS — G629 Polyneuropathy, unspecified: Secondary | ICD-10-CM | POA: Diagnosis not present

## 2022-02-26 DIAGNOSIS — E559 Vitamin D deficiency, unspecified: Secondary | ICD-10-CM | POA: Diagnosis not present

## 2022-02-26 NOTE — Patient Instructions (Signed)
For instructions on how to put on your Tri-Lock Ankle Brace, please visit [email or web address]    Plantar Fasciitis (Heel Spur Syndrome) with Rehab The plantar fascia is a fibrous, ligament-like, soft-tissue structure that spans the bottom of the foot. Plantar fasciitis is a condition that causes pain in the foot due to inflammation of the tissue. SYMPTOMS  Pain and tenderness on the underneath side of the foot. Pain that worsens with standing or walking. CAUSES  Plantar fasciitis is caused by irritation and injury to the plantar fascia on the underneath side of the foot. Common mechanisms of injury include: Direct trauma to bottom of the foot. Damage to a small nerve that runs under the foot where the main fascia attaches to the heel bone. Stress placed on the plantar fascia due to bone spurs. RISK INCREASES WITH:  Activities that place stress on the plantar fascia (running, jumping, pivoting, or cutting). Poor strength and flexibility. Improperly fitted shoes. Tight calf muscles. Flat feet. Failure to warm-up properly before activity. Obesity. PREVENTION Warm up and stretch properly before activity. Allow for adequate recovery between workouts. Maintain physical fitness: Strength, flexibility, and endurance. Cardiovascular fitness. Maintain a health body weight. Avoid stress on the plantar fascia. Wear properly fitted shoes, including arch supports for individuals who have flat feet.  PROGNOSIS  If treated properly, then the symptoms of plantar fasciitis usually resolve without surgery. However, occasionally surgery is necessary.  RELATED COMPLICATIONS  Recurrent symptoms that may result in a chronic condition. Problems of the lower back that are caused by compensating for the injury, such as limping. Pain or weakness of the foot during push-off following surgery. Chronic inflammation, scarring, and partial or complete fascia tear, occurring more often from repeated  injections.  TREATMENT  Treatment initially involves the use of ice and medication to help reduce pain and inflammation. The use of strengthening and stretching exercises may help reduce pain with activity, especially stretches of the Achilles tendon. These exercises may be performed at home or with a therapist. Your caregiver may recommend that you use heel cups of arch supports to help reduce stress on the plantar fascia. Occasionally, corticosteroid injections are given to reduce inflammation. If symptoms persist for greater than 6 months despite non-surgical (conservative), then surgery may be recommended.   MEDICATION  If pain medication is necessary, then nonsteroidal anti-inflammatory medications, such as aspirin and ibuprofen, or other minor pain relievers, such as acetaminophen, are often recommended. Do not take pain medication within 7 days before surgery. Prescription pain relievers may be given if deemed necessary by your caregiver. Use only as directed and only as much as you need. Corticosteroid injections may be given by your caregiver. These injections should be reserved for the most serious cases, because they may only be given a certain number of times.  HEAT AND COLD Cold treatment (icing) relieves pain and reduces inflammation. Cold treatment should be applied for 10 to 15 minutes every 2 to 3 hours for inflammation and pain and immediately after any activity that aggravates your symptoms. Use ice packs or massage the area with a piece of ice (ice massage). Heat treatment may be used prior to performing the stretching and strengthening activities prescribed by your caregiver, physical therapist, or athletic trainer. Use a heat pack or soak the injury in warm water.  SEEK IMMEDIATE MEDICAL CARE IF: Treatment seems to offer no benefit, or the condition worsens. Any medications produce adverse side effects.  EXERCISES- RANGE OF MOTION (ROM) AND STRETCHING EXERCISES - Plantar    IF:  Treatment seems to offer no benefit, or the condition worsens.  Any medications produce adverse side effects.  EXERCISES- RANGE OF  MOTION (ROM) AND STRETCHING EXERCISES - Plantar Fasciitis (Heel Spur Syndrome) These exercises may help you when beginning to rehabilitate your injury. Your symptoms may resolve with or without further involvement from your physician, physical therapist or athletic trainer. While completing these exercises, remember:   Restoring tissue flexibility helps normal motion to return to the joints. This allows healthier, less painful movement and activity.  An effective stretch should be held for at least 30 seconds.  A stretch should never be painful. You should only feel a gentle lengthening or release in the stretched tissue.  RANGE OF MOTION - Toe Extension, Flexion  Sit with your right / left leg crossed over your opposite knee.  Grasp your toes and gently pull them back toward the top of your foot. You should feel a stretch on the bottom of your toes and/or foot.  Hold this stretch for 10 seconds.  Now, gently pull your toes toward the bottom of your foot. You should feel a stretch on the top of your toes and or foot.  Hold this stretch for 10 seconds. Repeat  times. Complete this stretch 3 times per day.   RANGE OF MOTION - Ankle Dorsiflexion, Active Assisted  Remove shoes and sit on a chair that is preferably not on a carpeted surface.  Place right / left foot under knee. Extend your opposite leg for support.  Keeping your heel down, slide your right / left foot back toward the chair until you feel a stretch at your ankle or calf. If you do not feel a stretch, slide your bottom forward to the edge of the chair, while still keeping your heel down.  Hold this stretch for 10 seconds. Repeat 3 times. Complete this stretch 2 times per day.   STRETCH  Gastroc, Standing  Place hands on wall.  Extend right / left leg, keeping the front knee somewhat bent.  Slightly point your toes inward on your back foot.  Keeping your right / left heel on the floor and your knee straight, shift  your weight toward the wall, not allowing your back to arch.  You should feel a gentle stretch in the right / left calf. Hold this position for 10 seconds. Repeat 3 times. Complete this stretch 2 times per day.  STRETCH  Soleus, Standing  Place hands on wall.  Extend right / left leg, keeping the other knee somewhat bent.  Slightly point your toes inward on your back foot.  Keep your right / left heel on the floor, bend your back knee, and slightly shift your weight over the back leg so that you feel a gentle stretch deep in your back calf.  Hold this position for 10 seconds. Repeat 3 times. Complete this stretch 2 times per day.  STRETCH  Gastrocsoleus, Standing  Note: This exercise can place a lot of stress on your foot and ankle. Please complete this exercise only if specifically instructed by your caregiver.   Place the ball of your right / left foot on a step, keeping your other foot firmly on the same step.  Hold on to the wall or a rail for balance.  Slowly lift your other foot, allowing your body weight to press your heel down over the edge of the step.  You should feel a stretch in your right / left calf.  Hold this position   for 10 seconds.  Repeat this exercise with a slight bend in your right / left knee. Repeat 3 times. Complete this stretch 2 times per day.   STRENGTHENING EXERCISES - Plantar Fasciitis (Heel Spur Syndrome)  These exercises may help you when beginning to rehabilitate your injury. They may resolve your symptoms with or without further involvement from your physician, physical therapist or athletic trainer. While completing these exercises, remember:   Muscles can gain both the endurance and the strength needed for everyday activities through controlled exercises.  Complete these exercises as instructed by your physician, physical therapist or athletic trainer. Progress the resistance and repetitions only as guided.  STRENGTH - Towel Curls  Sit in  a chair positioned on a non-carpeted surface.  Place your foot on a towel, keeping your heel on the floor.  Pull the towel toward your heel by only curling your toes. Keep your heel on the floor. Repeat 3 times. Complete this exercise 2 times per day.  STRENGTH - Ankle Inversion  Secure one end of a rubber exercise band/tubing to a fixed object (table, pole). Loop the other end around your foot just before your toes.  Place your fists between your knees. This will focus your strengthening at your ankle.  Slowly, pull your big toe up and in, making sure the band/tubing is positioned to resist the entire motion.  Hold this position for 10 seconds.  Have your muscles resist the band/tubing as it slowly pulls your foot back to the starting position. Repeat 3 times. Complete this exercises 2 times per day.  Document Released: 06/18/2005 Document Revised: 09/10/2011 Document Reviewed: 09/30/2008 [employer] Patient Information 2014 [employer].  

## 2022-02-26 NOTE — Progress Notes (Unsigned)
Subjective:   Patient ID: Stephanie Singleton, female   DOB: 76 y.o.   MRN: [id number]   HPI Chief Complaint  Patient presents with   Foot Pain    Left foot pain, which started a year ago, pt states that the heel sharp pain, foot gives out on the patient, pt almost fell a few times,  A1c- 6.6 [id number]   Tried tynleol. No injury. No swelling. Occasional numbness/tingling and that goes into the toes and up the leg. She is not sure if this is connected as she was diagnosed with neuropathy previoysly. She feels the left leg is sitting in an ice bath.   She states the more tired she is the more it "gives away".     ROS      Objective:  Physical Exam  ***     Assessment:  ***     Plan:  -Treatment options discussed including all alternatives, risks, and complications -Etiology of symptoms were discussed -[place] -Home PT, offered formal PT -Shoes/good arch support  Cc: Dr. Larose Hires, MD

## 2022-02-27 ENCOUNTER — Other Ambulatory Visit: Payer: Self-pay | Admitting: Podiatry

## 2022-02-27 ENCOUNTER — Ambulatory Visit (HOSPITAL_COMMUNITY)
Admission: RE | Admit: 2022-02-27 | Discharge: 2022-02-27 | Disposition: A | Discharge status: Home / Self Care | Payer: Medicare Other | Source: Ambulatory Visit | Attending: Podiatry | Admitting: Podiatry

## 2022-02-27 DIAGNOSIS — R0989 Other specified symptoms and signs involving the circulatory and respiratory systems: Secondary | ICD-10-CM

## 2022-02-28 ENCOUNTER — Other Ambulatory Visit: Payer: Self-pay | Admitting: Podiatry

## 2022-02-28 DIAGNOSIS — M7752 Other enthesopathy of left foot: Secondary | ICD-10-CM

## 2022-02-28 LAB — CBC
Hematocrit: 35.2 % (ref 34.0–46.6)
Hemoglobin: 12 g/dL (ref 11.1–15.9)
MCH: 27.5 pg (ref 26.6–33.0)
MCHC: 34.1 g/dL (ref 31.5–35.7)
MCV: 81 fL (ref 79–97)
Platelets: 339 10*3/uL (ref 150–450)
RBC: 4.36 x10E6/uL (ref 3.77–5.28)
RDW: 14.5 % (ref 11.7–15.4)
WBC: 6.8 10*3/uL (ref 3.4–10.8)

## 2022-02-28 LAB — CMP14+EGFR
ALT: 9 IU/L (ref 0–32)
AST: 14 IU/L (ref 0–40)
Albumin/Globulin Ratio: 1.2 (ref 1.2–2.2)
Albumin: 3.8 g/dL (ref 3.8–4.8)
Alkaline Phosphatase: 107 IU/L (ref 44–121)
BUN/Creatinine Ratio: 10 — ABNORMAL LOW (ref 12–28)
BUN: 10 mg/dL (ref 8–27)
Bilirubin Total: 0.5 mg/dL (ref 0.0–1.2)
CO2: 24 mmol/L (ref 20–29)
Calcium: 9.7 mg/dL (ref 8.7–10.3)
Chloride: 107 mmol/L — ABNORMAL HIGH (ref 96–106)
Creatinine, Ser: 0.97 mg/dL (ref 0.57–1.00)
Globulin, Total: 3.1 g/dL (ref 1.5–4.5)
Glucose: 119 mg/dL — ABNORMAL HIGH (ref 70–99)
Potassium: 3.9 mmol/L (ref 3.5–5.2)
Sodium: 142 mmol/L (ref 134–144)
Total Protein: 6.9 g/dL (ref 6.0–8.5)
eGFR: 61 mL/min/{1.73_m2} (ref >59)

## 2022-02-28 LAB — MICROALBUMIN / CREATININE URINE RATIO
Creatinine, Urine: 210.6 mg/dL
Microalb/Creat Ratio: 23 mg/g creat (ref 0–29)
Microalbumin, Urine: 48.5 ug/mL

## 2022-02-28 LAB — LIPID PANEL
Chol/HDL Ratio: 3.5 ratio (ref 0.0–4.4)
Cholesterol, Total: 147 mg/dL (ref 100–199)
HDL: 42 mg/dL (ref >39)
LDL Chol Calc (NIH): 89 mg/dL (ref 0–99)
Triglycerides: 81 mg/dL (ref 0–149)
VLDL Cholesterol Cal: 16 mg/dL (ref 5–40)

## 2022-02-28 LAB — TSH: TSH: 1.02 u[IU]/mL (ref 0.450–4.500)

## 2022-02-28 LAB — VITAMIN D 25 HYDROXY (VIT D DEFICIENCY, FRACTURES): Vit D, 25-Hydroxy: 29.4 ng/mL — ABNORMAL LOW (ref 30.0–100.0)

## 2022-03-02 DIAGNOSIS — R9082 White matter disease, unspecified: Secondary | ICD-10-CM | POA: Diagnosis not present

## 2022-03-02 DIAGNOSIS — E119 Type 2 diabetes mellitus without complications: Secondary | ICD-10-CM | POA: Diagnosis not present

## 2022-03-02 DIAGNOSIS — Z79899 Other long term (current) drug therapy: Secondary | ICD-10-CM | POA: Diagnosis not present

## 2022-03-02 DIAGNOSIS — Z87891 Personal history of nicotine dependence: Secondary | ICD-10-CM | POA: Diagnosis not present

## 2022-03-02 DIAGNOSIS — R519 Headache, unspecified: Secondary | ICD-10-CM | POA: Diagnosis not present

## 2022-03-02 DIAGNOSIS — H538 Other visual disturbances: Secondary | ICD-10-CM | POA: Diagnosis not present

## 2022-03-02 DIAGNOSIS — I160 Hypertensive urgency: Secondary | ICD-10-CM | POA: Diagnosis not present

## 2022-03-02 DIAGNOSIS — Z888 Allergy status to other drugs, medicaments and biological substances status: Secondary | ICD-10-CM | POA: Diagnosis not present

## 2022-03-02 DIAGNOSIS — Z886 Allergy status to analgesic agent status: Secondary | ICD-10-CM | POA: Diagnosis not present

## 2022-03-02 DIAGNOSIS — G319 Degenerative disease of nervous system, unspecified: Secondary | ICD-10-CM | POA: Diagnosis not present

## 2022-03-02 DIAGNOSIS — I10 Essential (primary) hypertension: Secondary | ICD-10-CM | POA: Diagnosis not present

## 2022-03-08 ENCOUNTER — Ambulatory Visit
Admission: RE | Admit: 2022-03-08 | Discharge: 2022-03-08 | Disposition: A | Discharge status: Home / Self Care | Payer: Medicare Other | Source: Ambulatory Visit | Attending: Family Medicine | Admitting: Family Medicine

## 2022-03-08 DIAGNOSIS — Z1231 Encounter for screening mammogram for malignant neoplasm of breast: Secondary | ICD-10-CM | POA: Diagnosis not present

## 2022-03-21 DIAGNOSIS — H35372 Puckering of macula, left eye: Secondary | ICD-10-CM | POA: Diagnosis not present

## 2022-03-21 DIAGNOSIS — H25013 Cortical age-related cataract, bilateral: Secondary | ICD-10-CM | POA: Diagnosis not present

## 2022-03-21 DIAGNOSIS — E11319 Type 2 diabetes mellitus with unspecified diabetic retinopathy without macular edema: Secondary | ICD-10-CM | POA: Diagnosis not present

## 2022-03-21 LAB — HM DIABETES EYE EXAM

## 2022-03-22 ENCOUNTER — Telehealth: Payer: Self-pay

## 2022-03-22 NOTE — Telephone Encounter (Signed)
Patient called said can not seem to get her blood pressure down.  Took several times and the lowest # was 161/99. Patient does not see no reason to come in just need nurse to giver her call.

## 2022-03-22 NOTE — Telephone Encounter (Signed)
Spoke with patient advised simpson is out of office offered appointment with available providers patient declined and will call back to schedule office visit.

## 2022-03-30 ENCOUNTER — Encounter: Payer: Self-pay | Admitting: Family Medicine

## 2022-03-30 ENCOUNTER — Ambulatory Visit (INDEPENDENT_AMBULATORY_CARE_PROVIDER_SITE_OTHER): Payer: Medicare Other | Admitting: Family Medicine

## 2022-03-30 VITALS — BP 150/90 | HR 59 | Ht 66.0 in | Wt 204.0 lb

## 2022-03-30 DIAGNOSIS — E1121 Type 2 diabetes mellitus with diabetic nephropathy: Secondary | ICD-10-CM | POA: Diagnosis not present

## 2022-03-30 DIAGNOSIS — I10 Essential (primary) hypertension: Secondary | ICD-10-CM | POA: Diagnosis not present

## 2022-03-30 DIAGNOSIS — E785 Hyperlipidemia, unspecified: Secondary | ICD-10-CM | POA: Diagnosis not present

## 2022-03-30 DIAGNOSIS — Z2821 Immunization not carried out because of patient refusal: Secondary | ICD-10-CM | POA: Diagnosis not present

## 2022-03-30 MED ORDER — AMLODIPINE BESYLATE 5 MG PO TABS: 5.0000 mg | ORAL_TABLET | Freq: Every day | ORAL | 2 refills | Status: DC

## 2022-03-30 NOTE — Patient Instructions (Addendum)
F/U in November as before, call if you need me sooner  Increase amlodipine to 5 mg daily, continue olmesartan/ HCTZ as before ( OK to take two amlodipine 2.5 mg tablets together every day until you fill the 5 mg tablet which is prescribed at new higher dose , one daily  Nurse please send for diabetic eye exam  It is important that you exercise regularly at least 30 minutes 5 times a week. If you develop chest pain, have severe difficulty breathing, or feel very tired, stop exercising immediately and seek medical attention   Thanks for choosing [facility], we consider it a privelige to serve you.       I have taken tradjenta, your diabetic pill off the list since your blood sugar is very well controlled and you report taking one every 2 to 3 weeks, dependent on what you have had to eat or drink

## 2022-04-01 ENCOUNTER — Encounter: Payer: Self-pay | Admitting: Family Medicine

## 2022-04-01 NOTE — Assessment & Plan Note (Signed)
Hyperlipidemia:Low fat diet discussed and encouraged.   Lipid Panel  Lab Results  Component Value Date   CHOL 147 02/26/2022   HDL 42 02/26/2022   LDLCALC 89 02/26/2022   TRIG 81 02/26/2022   CHOLHDL 3.5 02/26/2022   Controlled, no change in medication

## 2022-04-01 NOTE — Assessment & Plan Note (Signed)
  Patient re-educated about  the importance of commitment to a  minimum of 150 minutes of exercise per week as able.  The importance of healthy food choices with portion control discussed, as well as eating regularly and within a 12 hour window most days. The need to choose "clean , green" food 50 to 75% of the time is discussed, as well as to make water the primary drink and set a goal of 64 ounces water daily.       03/30/2022    1:22 PM 02/15/2022    1:28 PM 07/20/2021    1:47 PM  Weight /BMI  Weight 204 lb 218 lb 225 lb 6.4 oz  Height '5\' 6"'$  (1.676 m) '5\' 6"'$  (1.676 m) '5\' 6"'$  (1.676 m)  BMI 32.93 kg/m2 35.19 kg/m2 36.38 kg/m2

## 2022-04-01 NOTE — Assessment & Plan Note (Signed)
Refuses flu and pneumonia vaccines

## 2022-04-01 NOTE — Assessment & Plan Note (Signed)
Ms. Hazell is reminded of the importance of commitment to daily physical activity for 30 minutes or more, as able and the need to limit carbohydrate intake to 30 to 60 grams per meal to help with blood sugar control.   Ms. Whidden is reminded of the importance of daily foot exam, annual eye examination, and good blood sugar, blood pressure and cholesterol control.     Latest Ref Rng & Units 02/26/2022    1:27 PM 02/15/2022    3:18 PM 07/20/2021   12:00 AM 07/06/2021    4:37 PM 12/20/2020    3:33 PM  Diabetic Labs  HbA1c 0.0 - 7.0 %  6.6   7.4  7.5   Micro/Creat Ratio 0 - 29 mg/g creat 23       Chol 100 - 199 mg/dL 147   167     HDL >39 mg/dL 42   44     Calc LDL 0 - 99 mg/dL 89   99     Triglycerides 0 - 149 mg/dL 81   132     Creatinine 0.57 - 1.00 mg/dL 0.97   1.02         03/30/2022    2:05 PM 03/30/2022    1:27 PM 03/30/2022    1:22 PM 02/15/2022    2:13 PM 02/15/2022    2:12 PM 02/15/2022    1:28 PM 07/20/2021    1:47 PM  BP/Weight  Systolic BP 371 696 789 381 017 510 258  Diastolic BP 90 92 91 80 80 90 86  Wt. (Lbs)   204   218 225.4  BMI   32.93 kg/m2   35.19 kg/m2 36.38 kg/m2      Latest Ref Rng & Units 02/15/2022    1:20 PM 02/23/2021   12:00 AM  Foot/eye exam completion dates  Eye Exam No Retinopathy  Retinopathy      Foot Form Completion  Done      This result is from an external source.

## 2022-04-01 NOTE — Assessment & Plan Note (Signed)
Uncontrolled, inc amlodipine to 5 mg daily DASH diet and commitment to daily physical activity for a minimum of 30 minutes discussed and encouraged, as a part of hypertension management. The importance of attaining a healthy weight is also discussed.     03/30/2022    2:05 PM 03/30/2022    1:27 PM 03/30/2022    1:22 PM 02/15/2022    2:13 PM 02/15/2022    2:12 PM 02/15/2022    1:28 PM 07/20/2021    1:47 PM  BP/Weight  Systolic BP 786 767 209 470 962 836 629  Diastolic BP 90 92 91 80 80 90 86  Wt. (Lbs)   204   218 225.4  BMI   32.93 kg/m2   35.19 kg/m2 36.38 kg/m2

## 2022-04-01 NOTE — Progress Notes (Signed)
Stephanie Singleton     MRN: [id number]      DOB: 09/10/1945   HPI [name] is here for follow up and re-evaluation of chronic medical conditions, medication management and review of any available recent lab and radiology data.  Preventive health is updated, specifically  Cancer screening and Immunization.   Questions or concerns regarding consultations or procedures which the PT has had in the interim are  addressed. The PT denies any adverse reactions to current medications since the last visit.  There are no new concerns.  There are no specific complaints   ROS Denies recent fever or chills. Denies sinus pressure, nasal congestion, ear pain or sore throat. Denies chest congestion, productive cough or wheezing. Denies chest pains, palpitations and leg swelling Denies abdominal pain, nausea, vomiting,diarrhea or constipation.   Denies dysuria, frequency, hesitancy or incontinence. Denies joint pain, swelling and limitation in mobility. Denies headaches, seizures, numbness, or tingling. C/o grief and depression since losing her spouse  Denies skin break down or rash.   PE  BP (!) 150/90   Pulse (!) 59   Ht '5\' 6"'$  (1.676 m)   Wt 204 lb (92.5 kg)   SpO2 96%   BMI 32.93 kg/m   Patient alert and oriented and in no cardiopulmonary distress.  HEENT: No facial asymmetry, EOMI,     Neck supple .  Chest: Clear to auscultation bilaterally.  CVS: S1, S2 no murmurs, no S3.Regular rate.  ABD: Soft non tender.   Ext: No edema  MS: Adequate ROM spine, shoulders, hips and knees.  Skin: Intact, no ulcerations or rash noted.  Psych: Good eye contact, normal affect. Memory intact not anxious or depressed appearing.  CNS: CN 2-12 intact, power,  normal throughout.no focal deficits noted.   Assessment & Plan  Hypertension goal BP (blood pressure) < 130/80 Uncontrolled, inc amlodipine to 5 mg daily DASH diet and commitment to daily physical activity for a minimum of 30  minutes discussed and encouraged, as a part of hypertension management. The importance of attaining a healthy weight is also discussed.     03/30/2022    2:05 PM 03/30/2022    1:27 PM 03/30/2022    1:22 PM 02/15/2022    2:13 PM 02/15/2022    2:12 PM 02/15/2022    1:28 PM 07/20/2021    1:47 PM  BP/Weight  Systolic BP 419 622 297 989 211 941 740  Diastolic BP 90 92 91 80 80 90 86  Wt. (Lbs)   204   218 225.4  BMI   32.93 kg/m2   35.19 kg/m2 36.38 kg/m2       Type 2 diabetes with nephropathy ([place]) [name] is reminded of the importance of commitment to daily physical activity for 30 minutes or more, as able and the need to limit carbohydrate intake to 30 to 60 grams per meal to help with blood sugar control.   [name] is reminded of the importance of daily foot exam, annual eye examination, and good blood sugar, blood pressure and cholesterol control.     Latest Ref Rng & Units 02/26/2022    1:27 PM 02/15/2022    3:18 PM 07/20/2021   12:00 AM 07/06/2021    4:37 PM 12/20/2020    3:33 PM  Diabetic Labs  HbA1c 0.0 - 7.0 %  6.6   7.4  7.5   Micro/Creat Ratio 0 - 29 mg/g creat 23       Chol 100 - 199 mg/dL 147  167     HDL >39 mg/dL 42   44     Calc LDL 0 - 99 mg/dL 89   99     Triglycerides 0 - 149 mg/dL 81   132     Creatinine 0.57 - 1.00 mg/dL 0.97   1.02         03/30/2022    2:05 PM 03/30/2022    1:27 PM 03/30/2022    1:22 PM 02/15/2022    2:13 PM 02/15/2022    2:12 PM 02/15/2022    1:28 PM 07/20/2021    1:47 PM  BP/Weight  Systolic BP 195 093 267 124 580 998 338  Diastolic BP 90 92 91 80 80 90 86  Wt. (Lbs)   204   218 225.4  BMI   32.93 kg/m2   35.19 kg/m2 36.38 kg/m2      Latest Ref Rng & Units 02/15/2022    1:20 PM 02/23/2021   12:00 AM  Foot/eye exam completion dates  Eye Exam No Retinopathy  Retinopathy      Foot Form Completion  Done      This result is from an external source.        Vaccination not carried out because of patient refusal Refuses  flu and pneumonia vaccines  Morbid obesity ([facility])  Patient re-educated about  the importance of commitment to a  minimum of 150 minutes of exercise per week as able.  The importance of healthy food choices with portion control discussed, as well as eating regularly and within a 12 hour window most days. The need to choose "clean , green" food 50 to 75% of the time is discussed, as well as to make water the primary drink and set a goal of 64 ounces water daily.       03/30/2022    1:22 PM 02/15/2022    1:28 PM 07/20/2021    1:47 PM  Weight /BMI  Weight 204 lb 218 lb 225 lb 6.4 oz  Height '5\' 6"'$  (1.676 m) '5\' 6"'$  (1.676 m) '5\' 6"'$  (1.676 m)  BMI 32.93 kg/m2 35.19 kg/m2 36.38 kg/m2      Hyperlipidemia LDL goal <100 Hyperlipidemia:Low fat diet discussed and encouraged.   Lipid Panel  Lab Results  Component Value Date   CHOL 147 02/26/2022   HDL 42 02/26/2022   LDLCALC 89 02/26/2022   TRIG 81 02/26/2022   CHOLHDL 3.5 02/26/2022   Controlled, no change in medication

## 2022-04-03 ENCOUNTER — Ambulatory Visit: Payer: Medicare Other | Attending: Adult Health | Admitting: *Deleted

## 2022-04-03 DIAGNOSIS — Z5181 Encounter for therapeutic drug level monitoring: Secondary | ICD-10-CM | POA: Diagnosis not present

## 2022-04-03 DIAGNOSIS — I4891 Unspecified atrial fibrillation: Secondary | ICD-10-CM

## 2022-04-03 DIAGNOSIS — I272 Pulmonary hypertension, unspecified: Secondary | ICD-10-CM

## 2022-04-03 LAB — POCT INR: INR: 2.9 (ref 2.0–3.0)

## 2022-04-03 NOTE — Patient Instructions (Signed)
Description   Continue taking warfarin 1 tablet daily except for 1/2 on Tuesdays. Recheck INR in 8 weeks. [facility] [phone] or [phone]

## 2022-04-04 DIAGNOSIS — H2513 Age-related nuclear cataract, bilateral: Secondary | ICD-10-CM | POA: Diagnosis not present

## 2022-04-04 DIAGNOSIS — H35033 Hypertensive retinopathy, bilateral: Secondary | ICD-10-CM | POA: Diagnosis not present

## 2022-04-04 DIAGNOSIS — H43813 Vitreous degeneration, bilateral: Secondary | ICD-10-CM | POA: Diagnosis not present

## 2022-04-10 ENCOUNTER — Ambulatory Visit: Payer: Medicare Other | Admitting: Podiatry

## 2022-04-12 DIAGNOSIS — D3611 Benign neoplasm of peripheral nerves and autonomic nervous system of face, head, and neck: Secondary | ICD-10-CM | POA: Diagnosis not present

## 2022-04-13 ENCOUNTER — Telehealth: Payer: Self-pay | Admitting: *Deleted

## 2022-04-13 ENCOUNTER — Other Ambulatory Visit: Payer: Self-pay | Admitting: Neurosurgery

## 2022-04-13 ENCOUNTER — Other Ambulatory Visit (HOSPITAL_COMMUNITY): Payer: Self-pay | Admitting: Ophthalmology

## 2022-04-13 DIAGNOSIS — H3410 Central retinal artery occlusion, unspecified eye: Secondary | ICD-10-CM

## 2022-04-13 DIAGNOSIS — D3611 Benign neoplasm of peripheral nerves and autonomic nervous system of face, head, and neck: Secondary | ICD-10-CM

## 2022-04-13 NOTE — Patient Outreach (Signed)
  Care Coordination   Initial Visit Note   04/13/2022 Name: Ahyana Skillin MRN: [id number] DOB: [birth year]  Kelleigh Skerritt is a 76 y.o. year old female who sees Fayrene Helper, MD for primary care. I spoke with  Jaelene Leitzel by phone today.  What matters to the patients health and wellness today?  I don't need a thing.   SDOH assessments and interventions completed:  Yes  SDOH Interventions Today    Flowsheet Row Most Recent Value  SDOH Interventions   Food Insecurity Interventions Intervention Not Indicated  Transportation Interventions Intervention Not Indicated        Care Coordination Interventions Activated:  No  Care Coordination Interventions:  No, not indicated   Follow up plan: No further intervention required.   Encounter Outcome:  Pt. Refused   Lilu Mcglown C. Myrtie Neither, MSN, [facility] Gerontological Nurse Practitioner [facility] [phone]

## 2022-04-16 ENCOUNTER — Other Ambulatory Visit: Payer: Self-pay | Admitting: Physician Assistant

## 2022-04-17 ENCOUNTER — Ambulatory Visit (HOSPITAL_COMMUNITY)
Admission: RE | Admit: 2022-04-17 | Discharge: 2022-04-17 | Disposition: A | Discharge status: Home / Self Care | Payer: Medicare Other | Source: Ambulatory Visit | Attending: Cardiology | Admitting: Cardiology

## 2022-04-17 DIAGNOSIS — H3412 Central retinal artery occlusion, left eye: Secondary | ICD-10-CM

## 2022-04-17 DIAGNOSIS — H3410 Central retinal artery occlusion, unspecified eye: Secondary | ICD-10-CM | POA: Insufficient documentation

## 2022-04-19 ENCOUNTER — Ambulatory Visit (INDEPENDENT_AMBULATORY_CARE_PROVIDER_SITE_OTHER): Payer: Medicare Other | Admitting: Family Medicine

## 2022-04-19 ENCOUNTER — Encounter: Payer: Self-pay | Admitting: Family Medicine

## 2022-04-19 DIAGNOSIS — Z789 Other specified health status: Secondary | ICD-10-CM | POA: Diagnosis not present

## 2022-04-19 DIAGNOSIS — I10 Essential (primary) hypertension: Secondary | ICD-10-CM

## 2022-04-19 MED ORDER — CLONIDINE HCL 0.2 MG PO TABS
ORAL_TABLET | ORAL | 3 refills | Status: DC
Start: 2022-04-19 — End: 2022-05-29

## 2022-04-19 NOTE — Patient Instructions (Addendum)
F/U as before, call if you need me sooner  Stop amlodipine as you are not tolerating this  New in place of amlodipine is clonidine 0.2 mg one at bedtime  Continue olmesartan / hctz as before  Thanks for choosing [facility], we consider it a privelige to serve you.

## 2022-04-19 NOTE — Progress Notes (Signed)
Virtual Visit via Telephone Note  I connected with Stephanie Singleton on 04/19/22 at 11:40 AM EDT by telephone and verified that I am speaking with the correct person using two identifiers.  Location: Patient: home Provider: office   I discussed the limitations, risks, security and privacy concerns of performing an evaluation and management service by telephone and the availability of in person appointments. I also discussed with the patient that there may be a patient responsible charge related to this service. The patient expressed understanding and agreed to proceed.   History of Present Illness: C/o sore throat and palpitations since starting amlodipine, denies cough , wheeze, rach, difficulty breathing or swallowing   Observations/Objective: There were no vitals taken for this visit. Good communication with no confusion and intact memory. Alert and oriented x 3 No signs of respiratory distress during speech   Assessment and Plan: Medication intolerance rpeorts adverse symptoms following initiating amlodipine, advised to stop med and in its place is clonidine 0.'2mg'$  daily  Hypertension DASH diet and commitment to daily physical activity for a minimum of 30 minutes discussed and encouraged, as a part of hypertension management. The importance of attaining a healthy weight is also discussed.     03/30/2022    2:05 PM 03/30/2022    1:27 PM 03/30/2022    1:22 PM 02/15/2022    2:13 PM 02/15/2022    2:12 PM 02/15/2022    1:28 PM 07/20/2021    1:47 PM  BP/Weight  Systolic BP 625 638 937 342 876 811 572  Diastolic BP 90 92 91 80 80 90 86  Wt. (Lbs)   204   218 225.4  BMI   32.93 kg/m2   35.19 kg/m2 36.38 kg/m2     .change in med due to intolerance with in office f/u in 4 to 6 weeks   Follow Up Instructions:    I discussed the assessment and treatment plan with the patient. The patient was provided an opportunity to ask questions and all were answered. The patient agreed with  the plan and demonstrated an understanding of the instructions.   The patient was advised to call back or seek an in-person evaluation if the symptoms worsen or if the condition fails to improve as anticipated.  I provided 9 minutes of non-face-to-face time during this encounter.   Tula Nakayama, MD

## 2022-04-22 ENCOUNTER — Other Ambulatory Visit: Payer: Self-pay | Admitting: Family Medicine

## 2022-04-22 ENCOUNTER — Encounter: Payer: Self-pay | Admitting: Family Medicine

## 2022-04-22 DIAGNOSIS — Z91148 Patient's other noncompliance with medication regimen for other reason: Secondary | ICD-10-CM | POA: Insufficient documentation

## 2022-04-22 DIAGNOSIS — Z789 Other specified health status: Secondary | ICD-10-CM | POA: Insufficient documentation

## 2022-04-22 DIAGNOSIS — E669 Obesity, unspecified: Secondary | ICD-10-CM | POA: Insufficient documentation

## 2022-04-22 NOTE — Assessment & Plan Note (Signed)
rpeorts adverse symptoms following initiating amlodipine, advised to stop med and in its place is clonidine 0.'2mg'$  daily

## 2022-04-22 NOTE — Assessment & Plan Note (Signed)
DASH diet and commitment to daily physical activity for a minimum of 30 minutes discussed and encouraged, as a part of hypertension management. The importance of attaining a healthy weight is also discussed.     03/30/2022    2:05 PM 03/30/2022    1:27 PM 03/30/2022    1:22 PM 02/15/2022    2:13 PM 02/15/2022    2:12 PM 02/15/2022    1:28 PM 07/20/2021    1:47 PM  BP/Weight  Systolic BP 196 940 982 867 519 824 299  Diastolic BP 90 92 91 80 80 90 86  Wt. (Lbs)   204   218 225.4  BMI   32.93 kg/m2   35.19 kg/m2 36.38 kg/m2     .change in med due to intolerance with in office f/u in 4 to 6 weeks

## 2022-05-05 ENCOUNTER — Other Ambulatory Visit: Payer: Self-pay | Admitting: Neurosurgery

## 2022-05-05 ENCOUNTER — Ambulatory Visit
Admission: RE | Admit: 2022-05-05 | Discharge: 2022-05-05 | Disposition: A | Discharge status: Home / Self Care | Payer: Medicare Other | Source: Ambulatory Visit | Attending: Neurosurgery | Admitting: Neurosurgery

## 2022-05-05 DIAGNOSIS — M542 Cervicalgia: Secondary | ICD-10-CM | POA: Diagnosis not present

## 2022-05-05 DIAGNOSIS — D3611 Benign neoplasm of peripheral nerves and autonomic nervous system of face, head, and neck: Secondary | ICD-10-CM

## 2022-05-05 DIAGNOSIS — G319 Degenerative disease of nervous system, unspecified: Secondary | ICD-10-CM | POA: Diagnosis not present

## 2022-05-05 DIAGNOSIS — M4802 Spinal stenosis, cervical region: Secondary | ICD-10-CM | POA: Diagnosis not present

## 2022-05-08 DIAGNOSIS — D492 Neoplasm of unspecified behavior of bone, soft tissue, and skin: Secondary | ICD-10-CM | POA: Diagnosis not present

## 2022-05-29 ENCOUNTER — Encounter: Payer: Self-pay | Admitting: Family Medicine

## 2022-05-29 ENCOUNTER — Ambulatory Visit: Payer: Medicare Other | Attending: Cardiology | Admitting: *Deleted

## 2022-05-29 ENCOUNTER — Ambulatory Visit (INDEPENDENT_AMBULATORY_CARE_PROVIDER_SITE_OTHER): Payer: Medicare Other | Admitting: Family Medicine

## 2022-05-29 VITALS — BP 160/82 | HR 66 | Ht 66.0 in | Wt 206.0 lb

## 2022-05-29 DIAGNOSIS — E1121 Type 2 diabetes mellitus with diabetic nephropathy: Secondary | ICD-10-CM | POA: Diagnosis not present

## 2022-05-29 DIAGNOSIS — I10 Essential (primary) hypertension: Secondary | ICD-10-CM | POA: Diagnosis not present

## 2022-05-29 DIAGNOSIS — I4891 Unspecified atrial fibrillation: Secondary | ICD-10-CM | POA: Diagnosis not present

## 2022-05-29 DIAGNOSIS — Z0001 Encounter for general adult medical examination with abnormal findings: Secondary | ICD-10-CM | POA: Insufficient documentation

## 2022-05-29 DIAGNOSIS — Z5181 Encounter for therapeutic drug level monitoring: Secondary | ICD-10-CM

## 2022-05-29 LAB — POCT INR: INR: 2.2 (ref 2.0–3.0)

## 2022-05-29 MED ORDER — CARVEDILOL 3.125 MG PO TABS: 3.1250 mg | ORAL_TABLET | Freq: Two times a day (BID) | ORAL | 3 refills | Status: DC

## 2022-05-29 NOTE — Patient Instructions (Addendum)
F/U in 5 to 6 weeks, call if you need me before  New additional medication for blood pressure is coreg  HBA1C and chem 7 and EGFR today   It is important that you exercise regularly at least 30 minutes 5 times a week. If you develop chest pain, have severe difficulty breathing, or feel very tired, stop exercising immediately and seek medical attention   Continue cutting back on sweets and white starches   Increase  vegetables  , water fruit and commit  to reducing fried and fatty foods and sugar  Thanks for choosing [facility], we consider it a privelige to serve you.  

## 2022-05-29 NOTE — Assessment & Plan Note (Signed)
Uncontrolled , add coreg 3.125 mg twice daily and f/u in 5 to 6 weks DASH diet and commitment to daily physical activity for a minimum of 30 minutes discussed and encouraged, as a part of hypertension management. The importance of attaining a healthy weight is also discussed.     05/29/2022    2:27 PM 05/29/2022    1:35 PM 05/29/2022    1:28 PM 03/30/2022    2:05 PM 03/30/2022    1:27 PM 03/30/2022    1:22 PM 02/15/2022    2:13 PM  BP/Weight  Systolic BP 350 757 322 567 209 198 022  Diastolic BP 82 82 84 90 92 91 80  Wt. (Lbs)   206   204   BMI   33.25 kg/m2   32.93 kg/m2     '

## 2022-05-29 NOTE — Patient Instructions (Signed)
Description   Today take 1 tablet then continue taking warfarin 1 tablet daily except for 1/2 on Tuesdays. Recheck INR in 8 weeks. Coumadin Clinic [phone] or [phone]

## 2022-05-29 NOTE — Assessment & Plan Note (Signed)
Annual exam as documented. Counseling done  re healthy lifestyle involving commitment to 150 minutes exercise per week, heart healthy diet, and attaining healthy weight.The importance of adequate sleep also discussed.  Immunization and cancer screening needs are specifically addressed at this visit.  

## 2022-05-29 NOTE — Progress Notes (Signed)
    Stephanie Singleton     MRN: [id number]      DOB: 11/17/1945  HPI: Patient is in for annual physical exam. Uncontrolled blood pressure is addressed Immunization is reviewed , and  she continues to decline recommended immunizations   PE: BP (!) 160/82   Pulse 66   Ht '5\' 6"'$  (1.676 m)   Wt 206 lb (93.4 kg)   SpO2 96%   BMI 33.25 kg/m   Pleasant  female, alert and oriented x 3, in no cardio-pulmonary distress. Afebrile. HEENT No facial trauma or asymetry. Sinuses non tender.  Extra occullar muscles intact.. External ears normal, . Neck: supple, no adenopathy,JVD or thyromegaly.No bruits.  Chest: Clear to ascultation bilaterally.No crackles or wheezes. Non tender to palpation  Cardiovascular system; Heart sounds normal,  S1 and  S2 ,no S3.  No murmur, or thrill.  Abdomen: Soft, non tender,       Musculoskeletal exam: Full ROM of spine, hips , shoulders and knees. No deformity ,swelling or crepitus noted. No muscle wasting or atrophy.   Neurologic: Cranial nerves 2 to 12 intact. Power, tone ,sensation  normal throughout. No disturbance in gait. No tremor.  Skin: Intact, no ulceration, erythema , scaling or rash noted. Pigmentation normal throughout  Psych; Normal mood and affect. Judgement and concentration normal   Assessment & Plan:  Annual visit for general adult medical examination with abnormal findings Annual exam as documented. Counseling done  re healthy lifestyle involving commitment to 150 minutes exercise per week, heart healthy diet, and attaining healthy weight.The importance of adequate sleep also discussed. Immunization and cancer screening needs are specifically addressed at this visit.   Hypertension Uncontrolled , add coreg 3.125 mg twice daily and f/u in 5 to 6 weks DASH diet and commitment to daily physical activity for a minimum of 30 minutes discussed and encouraged, as a part of hypertension management. The importance of attaining  a healthy weight is also discussed.     05/29/2022    2:27 PM 05/29/2022    1:35 PM 05/29/2022    1:28 PM 03/30/2022    2:05 PM 03/30/2022    1:27 PM 03/30/2022    1:22 PM 02/15/2022    2:13 PM  BP/Weight  Systolic BP 664 403 [phone] 643  Diastolic BP 82 82 84 90 92 91 80  Wt. (Lbs)   206   204   BMI   33.25 kg/m2   32.93 kg/m2     '

## 2022-06-07 ENCOUNTER — Other Ambulatory Visit: Payer: Self-pay | Admitting: Family Medicine

## 2022-06-29 ENCOUNTER — Other Ambulatory Visit: Payer: Self-pay | Admitting: Family Medicine

## 2022-07-01 ENCOUNTER — Other Ambulatory Visit: Payer: Self-pay | Admitting: Family Medicine

## 2022-07-08 ENCOUNTER — Other Ambulatory Visit: Payer: Self-pay | Admitting: Family Medicine

## 2022-07-11 ENCOUNTER — Ambulatory Visit: Payer: Medicare Other | Admitting: Family Medicine

## 2022-07-12 ENCOUNTER — Other Ambulatory Visit: Payer: Self-pay | Admitting: Physician Assistant

## 2022-07-16 ENCOUNTER — Encounter: Payer: Self-pay | Admitting: Family Medicine

## 2022-07-16 ENCOUNTER — Telehealth (INDEPENDENT_AMBULATORY_CARE_PROVIDER_SITE_OTHER): Payer: 59 | Admitting: Family Medicine

## 2022-07-16 VITALS — Ht 66.0 in | Wt 194.0 lb

## 2022-07-16 DIAGNOSIS — J209 Acute bronchitis, unspecified: Secondary | ICD-10-CM | POA: Insufficient documentation

## 2022-07-16 DIAGNOSIS — T7840XA Allergy, unspecified, initial encounter: Secondary | ICD-10-CM

## 2022-07-16 DIAGNOSIS — R051 Acute cough: Secondary | ICD-10-CM

## 2022-07-16 DIAGNOSIS — J219 Acute bronchiolitis, unspecified: Secondary | ICD-10-CM | POA: Insufficient documentation

## 2022-07-16 MED ORDER — BENZONATATE 100 MG PO CAPS: 100.0000 mg | ORAL_CAPSULE | Freq: Two times a day (BID) | ORAL | 0 refills | Status: DC | PRN

## 2022-07-16 MED ORDER — GUAIFENESIN 100 MG/5ML PO LIQD: 5.0000 mL | ORAL | 0 refills | Status: DC | PRN

## 2022-07-16 MED ORDER — LEVOCETIRIZINE DIHYDROCHLORIDE 5 MG PO TABS: 5.0000 mg | ORAL_TABLET | Freq: Once | ORAL | 1 refills | Status: DC | PRN

## 2022-07-16 NOTE — Progress Notes (Signed)
Virtual Visit via Video Note  I connected with Stephanie Singleton on 07/16/22 at 11:00 AM EST by a video enabled telemedicine application and verified that I am speaking with the correct person using two identifiers.  Location: Patient: Home Provider: [facility]   I discussed the limitations, risks, security, and privacy concerns of performing an evaluation and management service by video and the availability of in person appointments. I also discussed with the patient that there may be a patient responsible charge related to this service. The patient expressed understanding and agreed to proceed.  Subjective: PCP: Fayrene Helper, MD  Chief Complaint  Patient presents with   Cough    Patient complains of a cough, congestion, chills and sweating with no fever, sneezing for a week.   Stephanie Singleton 77 year old female, past medical history reviewed. Patient complains of chills, dry cough, post nasal drip, and sneezing. Symptoms began a few days ago. Associated symptoms include nasal congestion, rhinorrhea, and nonproductive cough. Patient denies fever,chills, dyspnea, sinus pain ,and headache Patient does not a history of asthma.      Cough The current episode started in the past 7 days. The problem has been gradually worsening. The problem occurs constantly. The cough is Non-productive. Associated symptoms include rhinorrhea. Pertinent negatives include no chest pain, chills, fever, sore throat or shortness of breath. She has tried OTC cough suppressant for the symptoms. The treatment provided mild relief. There is no history of asthma or environmental allergies.     ROS: Per HPI  Current Outpatient Medications:    ACCU-CHEK AVIVA PLUS test strip, USE TO TEST ONCE DAILY, Disp: 100 strip, Rfl: 3   Accu-Chek FastClix Lancets MISC, USE 1 UNIT 2 (TWO) TIMES A DAY., Disp: 102 each, Rfl: 5   benzonatate (TESSALON) 100 MG capsule, Take 1 capsule (100 mg  total) by mouth 2 (two) times daily as needed for cough., Disp: 30 capsule, Rfl: 0   carvedilol (COREG) 3.125 MG tablet, Take 1 tablet (3.125 mg total) by mouth 2 (two) times daily with a meal., Disp: 60 tablet, Rfl: 3   guaiFENesin (ROBITUSSIN) 100 MG/5ML liquid, Take 5 mLs by mouth every 4 (four) hours as needed for cough or to loosen phlegm., Disp: 120 mL, Rfl: 0   levocetirizine (XYZAL) 5 MG tablet, Take 1 tablet (5 mg total) by mouth once as needed for up to 1 dose for allergies., Disp: 15 tablet, Rfl: 1   olmesartan-hydrochlorothiazide (BENICAR HCT) 40-25 MG tablet, TAKE 1 TABLET BY MOUTH EVERY DAY, Disp: 90 tablet, Rfl: 3   pantoprazole (PROTONIX) 20 MG tablet, Take 1 tablet (20 mg total) by mouth daily., Disp: 30 tablet, Rfl: 5   potassium chloride SA (KLOR-CON M20) 20 MEQ tablet, TAKE 1 TABLET BY MOUTH DAILY, TAKE 1 EXTRA TABLET BY MOUTH DAILY ONLY ON DAYS YOU TAKE A TORSEMIDE, Disp: 90 tablet, Rfl: 0   pravastatin (PRAVACHOL) 20 MG tablet, TAKE 1 TABLET BY MOUTH EVERY DAY, Disp: 90 tablet, Rfl: 1   torsemide (DEMADEX) 20 MG tablet, TAKE 1 TABLET 2 TIMES A WEEK AS NEEDED FOR SWELLING. Please make overdue appt with Dr. Meda Coffee before anymore refills. 1st attempt, Disp: 8 tablet, Rfl: 0   TRADJENTA 5 MG TABS tablet, TAKE 1 TABLET BY MOUTH EVERY DAY, Disp: 90 tablet, Rfl: 0   warfarin (COUMADIN) 2.5 MG tablet, TAKE 1 TABLET BY MOUTH DAILY OR AS DIRECTED BY COUMADIN CLINIC, Disp: 90 tablet, Rfl: 0  Observations/Objective: Physical Exam Telehealth  visit  Assessment and Plan: Acute cough -     Benzonatate; Take 1 capsule (100 mg total) by mouth 2 (two) times daily as needed for cough.  Dispense: 30 capsule; Refill: 0 -     guaiFENesin; Take 5 mLs by mouth every 4 (four) hours as needed for cough or to loosen phlegm.  Dispense: 120 mL; Refill: 0  Allergy, initial encounter -     Levocetirizine Dihydrochloride; Take 1 tablet (5 mg total) by mouth once as needed for up to 1 dose for allergies.   Dispense: 15 tablet; Refill: 1  Acute bronchitis, unspecified organism Assessment & Plan: Spoke to patient via telehealth, Patient noted to have a non productive cough, rhinorrhea for the past week. Patient tried OTC drugs with no relief  Prescribed  medication for symptoms relief  Bezonatate '100mg'$  Robitussion '100mg'$  / 28m Xyzal 5 mg     Follow Up Instructions: No follow-ups on file.   I discussed the assessment and treatment plan with the patient. The patient was provided an opportunity to ask questions, and all were answered. The patient agreed with the plan and demonstrated an understanding of the instructions.   The patient was advised to call back or seek an in-person evaluation if the symptoms worsen or if the condition fails to improve as anticipated.  The above assessment and management plan was discussed with the patient. The patient verbalized understanding of and has agreed to the management plan.   IRenard HamperORia Comment FNP

## 2022-07-16 NOTE — Assessment & Plan Note (Signed)
Spoke to patient via telehealth, Patient noted to have a non productive cough, rhinorrhea for the past week. Patient tried OTC drugs with no relief  Prescribed  medication for symptoms relief  Bezonatate '100mg'$  Robitussion '100mg'$  / 93m Xyzal 5 mg

## 2022-07-20 ENCOUNTER — Telehealth: Payer: Self-pay | Admitting: Family Medicine

## 2022-07-20 MED ORDER — CEPHALEXIN 500 MG PO CAPS: 500.0000 mg | ORAL_CAPSULE | Freq: Three times a day (TID) | ORAL | 0 refills | Status: DC

## 2022-07-20 NOTE — Telephone Encounter (Signed)
Patient called said she is no better, just spoke to [place] on Monday and seems the medicine is not working. What does patient need to do? Please return patient call [phone] today.

## 2022-07-20 NOTE — Telephone Encounter (Signed)
error 

## 2022-07-20 NOTE — Telephone Encounter (Signed)
Spoke with patient.

## 2022-07-23 ENCOUNTER — Other Ambulatory Visit: Payer: Self-pay | Admitting: Family Medicine

## 2022-07-23 DIAGNOSIS — T7840XA Allergy, unspecified, initial encounter: Secondary | ICD-10-CM

## 2022-07-24 ENCOUNTER — Ambulatory Visit: Payer: 59 | Attending: Cardiology

## 2022-07-24 DIAGNOSIS — I4891 Unspecified atrial fibrillation: Secondary | ICD-10-CM | POA: Diagnosis not present

## 2022-07-24 DIAGNOSIS — Z5181 Encounter for therapeutic drug level monitoring: Secondary | ICD-10-CM

## 2022-07-24 LAB — POCT INR: INR: 2 (ref 2.0–3.0)

## 2022-07-24 NOTE — Patient Instructions (Signed)
Description   Today take 1 tablet then continue taking warfarin 1 tablet daily except for 1/2 on Tuesdays. Recheck INR in 8 weeks. Coumadin Clinic [phone] or [phone]

## 2022-07-26 DIAGNOSIS — J209 Acute bronchitis, unspecified: Secondary | ICD-10-CM | POA: Diagnosis not present

## 2022-08-10 ENCOUNTER — Other Ambulatory Visit: Payer: Self-pay | Admitting: Family Medicine

## 2022-08-10 ENCOUNTER — Telehealth: Payer: Self-pay | Admitting: Family Medicine

## 2022-08-10 MED ORDER — PROMETHAZINE-DM 6.25-15 MG/5ML PO SYRP: ORAL_SOLUTION | ORAL | 1 refills | Status: DC

## 2022-08-10 NOTE — Addendum Note (Signed)
Addended by: Fayrene Helper on: 08/10/2022 06:26 PM   Modules accepted: Orders

## 2022-08-10 NOTE — Telephone Encounter (Signed)
Patient aware - appt made 

## 2022-08-10 NOTE — Telephone Encounter (Signed)
Cough is getting better, still coughing right much needs cough medicine that Dr Moshe Cipro prescribe before help. It was a syrup (patient does not remember the name it)   Pharmacy  [id number] [place] [address] [place] [address]

## 2022-08-13 ENCOUNTER — Telehealth: Payer: Self-pay | Admitting: Family Medicine

## 2022-08-13 NOTE — Telephone Encounter (Signed)
Patient called after hours 2/9 stating that her cough med was never called in to pharm. Check previous tele.

## 2022-08-13 NOTE — Telephone Encounter (Signed)
Looks like cough med was sent in on 2/9

## 2022-08-24 ENCOUNTER — Other Ambulatory Visit: Payer: Self-pay | Admitting: Family Medicine

## 2022-08-27 ENCOUNTER — Other Ambulatory Visit: Payer: Self-pay | Admitting: Family Medicine

## 2022-09-10 DIAGNOSIS — H25012 Cortical age-related cataract, left eye: Secondary | ICD-10-CM | POA: Diagnosis not present

## 2022-09-10 DIAGNOSIS — H25041 Posterior subcapsular polar age-related cataract, right eye: Secondary | ICD-10-CM | POA: Diagnosis not present

## 2022-09-10 DIAGNOSIS — E113293 Type 2 diabetes mellitus with mild nonproliferative diabetic retinopathy without macular edema, bilateral: Secondary | ICD-10-CM | POA: Diagnosis not present

## 2022-09-10 DIAGNOSIS — H2513 Age-related nuclear cataract, bilateral: Secondary | ICD-10-CM | POA: Diagnosis not present

## 2022-09-14 ENCOUNTER — Ambulatory Visit (INDEPENDENT_AMBULATORY_CARE_PROVIDER_SITE_OTHER): Payer: 59 | Admitting: Family Medicine

## 2022-09-14 ENCOUNTER — Encounter: Payer: Self-pay | Admitting: Family Medicine

## 2022-09-14 VITALS — BP 120/78 | HR 56 | Ht 66.0 in | Wt 204.1 lb

## 2022-09-14 DIAGNOSIS — E785 Hyperlipidemia, unspecified: Secondary | ICD-10-CM | POA: Diagnosis not present

## 2022-09-14 DIAGNOSIS — E1121 Type 2 diabetes mellitus with diabetic nephropathy: Secondary | ICD-10-CM | POA: Diagnosis not present

## 2022-09-14 DIAGNOSIS — I10 Essential (primary) hypertension: Secondary | ICD-10-CM | POA: Diagnosis not present

## 2022-09-14 DIAGNOSIS — E669 Obesity, unspecified: Secondary | ICD-10-CM

## 2022-09-14 DIAGNOSIS — K219 Gastro-esophageal reflux disease without esophagitis: Secondary | ICD-10-CM | POA: Diagnosis not present

## 2022-09-14 DIAGNOSIS — Z2821 Immunization not carried out because of patient refusal: Secondary | ICD-10-CM

## 2022-09-14 DIAGNOSIS — Z1231 Encounter for screening mammogram for malignant neoplasm of breast: Secondary | ICD-10-CM

## 2022-09-14 NOTE — Patient Instructions (Addendum)
F/U  in 6 months, call if you need me sooner  Labs today, CBC, lipid, cmp and EGFr, hBA1C  Excellent blood pressure  Please schedule mamogram due in September at [facility].  Thanks for choosing [facility], we consider it a privelige to serve you.

## 2022-09-15 LAB — CMP14+EGFR
ALT: 11 IU/L (ref 0–32)
AST: 14 IU/L (ref 0–40)
Albumin/Globulin Ratio: 1.2 (ref 1.2–2.2)
Albumin: 4 g/dL (ref 3.8–4.8)
Alkaline Phosphatase: 110 IU/L (ref 44–121)
BUN/Creatinine Ratio: 16 (ref 12–28)
BUN: 14 mg/dL (ref 8–27)
Bilirubin Total: 0.4 mg/dL (ref 0.0–1.2)
CO2: 26 mmol/L (ref 20–29)
Calcium: 10.1 mg/dL (ref 8.7–10.3)
Chloride: 99 mmol/L (ref 96–106)
Creatinine, Ser: 0.86 mg/dL (ref 0.57–1.00)
Globulin, Total: 3.3 g/dL (ref 1.5–4.5)
Glucose: 149 mg/dL — ABNORMAL HIGH (ref 70–99)
Potassium: 4.2 mmol/L (ref 3.5–5.2)
Sodium: 139 mmol/L (ref 134–144)
Total Protein: 7.3 g/dL (ref 6.0–8.5)
eGFR: 71 mL/min/{1.73_m2} (ref >59)

## 2022-09-15 LAB — LIPID PANEL
Chol/HDL Ratio: 3.7 ratio (ref 0.0–4.4)
Cholesterol, Total: 173 mg/dL (ref 100–199)
HDL: 47 mg/dL (ref >39)
LDL Chol Calc (NIH): 107 mg/dL — ABNORMAL HIGH (ref 0–99)
Triglycerides: 105 mg/dL (ref 0–149)
VLDL Cholesterol Cal: 19 mg/dL (ref 5–40)

## 2022-09-15 LAB — HEMOGLOBIN A1C
Est. average glucose Bld gHb Est-mCnc: 157 mg/dL
Hgb A1c MFr Bld: 7.1 % — ABNORMAL HIGH (ref 4.8–5.6)

## 2022-09-15 LAB — CBC
Hematocrit: 40 % (ref 34.0–46.6)
Hemoglobin: 13.4 g/dL (ref 11.1–15.9)
MCH: 26.9 pg (ref 26.6–33.0)
MCHC: 33.5 g/dL (ref 31.5–35.7)
MCV: 80 fL (ref 79–97)
Platelets: 320 10*3/uL (ref 150–450)
RBC: 4.99 x10E6/uL (ref 3.77–5.28)
RDW: 14.5 % (ref 11.7–15.4)
WBC: 7.2 10*3/uL (ref 3.4–10.8)

## 2022-09-16 ENCOUNTER — Encounter: Payer: Self-pay | Admitting: Family Medicine

## 2022-09-16 MED ORDER — LINAGLIPTIN 5 MG PO TABS: 5.0000 mg | ORAL_TABLET | Freq: Every day | ORAL | 3 refills | Status: AC

## 2022-09-16 NOTE — Assessment & Plan Note (Signed)
  Patient re-educated about  the importance of commitment to a  minimum of 150 minutes of exercise per week as able.  The importance of healthy food choices with portion control discussed, as well as eating regularly and within a 12 hour window most days. The need to choose "clean , green" food 50 to 75% of the time is discussed, as well as to make water the primary drink and set a goal of 64 ounces water daily.       09/14/2022    1:40 PM 07/16/2022   10:40 AM 05/29/2022    1:28 PM  Weight /BMI  Weight 204 lb 1.3 oz 194 lb 206 lb  Height 5\' 6"  (1.676 m) 5\' 6"  (1.676 m) 5\' 6"  (1.676 m)  BMI 32.94 kg/m2 31.31 kg/m2 33.25 kg/m2    Weight gain, needs to reduce caloric intake

## 2022-09-16 NOTE — Assessment & Plan Note (Signed)
Controlled, no change in medication  

## 2022-09-16 NOTE — Assessment & Plan Note (Signed)
Controlled, no change in medication DASH diet and commitment to daily physical activity for a minimum of 30 minutes discussed and encouraged, as a part of hypertension management. The importance of attaining a healthy weight is also discussed.     09/14/2022    2:40 PM 09/14/2022    1:40 PM 07/16/2022   10:40 AM 05/29/2022    2:27 PM 05/29/2022    1:35 PM 05/29/2022    1:28 PM 03/30/2022    2:05 PM  BP/Weight  Systolic BP 123456 0000000  0000000 0000000 0000000 Q000111Q  Diastolic BP 78 80  82 82 84 90  Wt. (Lbs)  204.08 194   206   BMI  32.94 kg/m2 31.31 kg/m2   33.25 kg/m2

## 2022-09-16 NOTE — Assessment & Plan Note (Signed)
  Patient re-educated about  the importance of commitment to a  minimum of 150 minutes of exercise per week as able.  The importance of healthy food choices with portion control discussed, as well as eating regularly and within a 12 hour window most days. The need to choose "clean , green" food 50 to 75% of the time is discussed, as well as to make water the primary drink and set a goal of 64 ounces water daily.       09/14/2022    1:40 PM 07/16/2022   10:40 AM 05/29/2022    1:28 PM  Weight /BMI  Weight 204 lb 1.3 oz 194 lb 206 lb  Height 5\' 6"  (1.676 m) 5\' 6"  (1.676 m) 5\' 6"  (1.676 m)  BMI 32.94 kg/m2 31.31 kg/m2 33.25 kg/m2    Needs to redu ce calories, fats and sugars

## 2022-09-16 NOTE — Assessment & Plan Note (Addendum)
Controlled, no change in medication, needs to reduce intake of sweets and carbs Stephanie Singleton is reminded of the importance of commitment to daily physical activity for 30 minutes or more, as able and the need to limit carbohydrate intake to 30 to 60 grams per meal to help with blood sugar control.   The need to take medication as prescribed, test blood sugar as directed, and to call between visits if there is a concern that blood sugar is uncontrolled is also discussed.   Stephanie Singleton is reminded of the importance of daily foot exam, annual eye examination, and good blood sugar, blood pressure and cholesterol control.     Latest Ref Rng & Units 09/14/2022    2:52 PM 02/26/2022    1:27 PM 02/15/2022    3:18 PM 07/20/2021   12:00 AM 07/06/2021    4:37 PM  Diabetic Labs  HbA1c 4.8 - 5.6 % 7.1   6.6   7.4   Micro/Creat Ratio 0 - 29 mg/g creat  23      Chol 100 - 199 mg/dL 173  147   167    HDL >39 mg/dL 47  42   44    Calc LDL 0 - 99 mg/dL 107  89   99    Triglycerides 0 - 149 mg/dL 105  81   132    Creatinine 0.57 - 1.00 mg/dL 0.86  0.97   1.02        09/14/2022    2:40 PM 09/14/2022    1:40 PM 07/16/2022   10:40 AM 05/29/2022    2:27 PM 05/29/2022    1:35 PM 05/29/2022    1:28 PM 03/30/2022    2:05 PM  BP/Weight  Systolic BP 123456 0000000  0000000 0000000 0000000 Q000111Q  Diastolic BP 78 80  82 82 84 90  Wt. (Lbs)  204.08 194   206   BMI  32.94 kg/m2 31.31 kg/m2   33.25 kg/m2       Latest Ref Rng & Units 03/21/2022   12:00 AM 02/15/2022    1:20 PM  Foot/eye exam completion dates  Eye Exam No Retinopathy No Retinopathy       Foot Form Completion   Done     This result is from an external source.

## 2022-09-16 NOTE — Assessment & Plan Note (Signed)
Refusing vaccines still , re educated

## 2022-09-16 NOTE — Progress Notes (Signed)
Stephanie Singleton     MRN: [id number]      DOB: 1945-09-25   HPI Stephanie Singleton is here for follow up and re-evaluation of chronic medical conditions, medication management and review of any available recent lab and radiology data.  Preventive health is updated, specifically  Cancer screening and Immunization.   Has had neurosurgery eval, no changes in hardware, conservatibve management with annual checks The PT denies any adverse reactions to current medications since the last visit.  There are no new concerns.  There are no specific complaints  Denies polyuria, polydipsia, blurred vision , or hypoglycemic episodes.   ROS Denies recent fever or chills. Denies sinus pressure, nasal congestion, ear pain or sore throat. Denies chest congestion, productive cough or wheezing. Denies chest pains, palpitations and leg swelling Denies abdominal pain, nausea, vomiting,diarrhea or constipation.   Denies dysuria, frequency, hesitancy or incontinence. Denies uncontrolled  joint pain, swelling and limitation in mobility. Denies headaches, seizures, numbness, or tingling. Denies depression, anxiety or insomnia. Denies skin break down or rash.   PE  BP 120/78   Pulse (!) 56   Ht 5\' 6"  (1.676 m)   Wt 204 lb 1.3 oz (92.6 kg)   SpO2 97%   BMI 32.94 kg/m   Patient alert and oriented and in no cardiopulmonary distress.  HEENT: No facial asymmetry, EOMI,     Neck decreased ROM Chest: Clear to auscultation bilaterally.  CVS: S1, S2 no murmurs, no S3.Regular rate.  ABD: Soft non tender.   Ext: No edema  MS: Adequate though reduced  ROM spine, shoulders, hips and knees.  Skin: Intact, no ulcerations or rash noted.  Psych: Good eye contact, normal affect. Memory intact not anxious or depressed appearing.  CNS: CN 2-12 intact, power,  normal throughout.no focal deficits noted.   Assessment & Plan  Hypertension Controlled, no change in medication DASH diet and commitment to daily  physical activity for a minimum of 30 minutes discussed and encouraged, as a part of hypertension management. The importance of attaining a healthy weight is also discussed.     09/14/2022    2:40 PM 09/14/2022    1:40 PM 07/16/2022   10:40 AM 05/29/2022    2:27 PM 05/29/2022    1:35 PM 05/29/2022    1:28 PM 03/30/2022    2:05 PM  BP/Weight  Systolic BP 123456 0000000  0000000 0000000 0000000 Q000111Q  Diastolic BP 78 80  82 82 84 90  Wt. (Lbs)  204.08 194   206   BMI  32.94 kg/m2 31.31 kg/m2   33.25 kg/m2        Type 2 diabetes with nephropathy (HCC) Controlled, no change in medication, needs to reduce intake of sweets and carbs Stephanie Singleton is reminded of the importance of commitment to daily physical activity for 30 minutes or more, as able and the need to limit carbohydrate intake to 30 to 60 grams per meal to help with blood sugar control.   The need to take medication as prescribed, test blood sugar as directed, and to call between visits if there is a concern that blood sugar is uncontrolled is also discussed.   Stephanie Singleton is reminded of the importance of daily foot exam, annual eye examination, and good blood sugar, blood pressure and cholesterol control.     Latest Ref Rng & Units 09/14/2022    2:52 PM 02/26/2022    1:27 PM 02/15/2022    3:18 PM 07/20/2021   12:00 AM 07/06/2021  4:37 PM  Diabetic Labs  HbA1c 4.8 - 5.6 % 7.1   6.6   7.4   Micro/Creat Ratio 0 - 29 mg/g creat  23      Chol 100 - 199 mg/dL 173  147   167    HDL >39 mg/dL 47  42   44    Calc LDL 0 - 99 mg/dL 107  89   99    Triglycerides 0 - 149 mg/dL 105  81   132    Creatinine 0.57 - 1.00 mg/dL 0.86  0.97   1.02        09/14/2022    2:40 PM 09/14/2022    1:40 PM 07/16/2022   10:40 AM 05/29/2022    2:27 PM 05/29/2022    1:35 PM 05/29/2022    1:28 PM 03/30/2022    2:05 PM  BP/Weight  Systolic BP 123456 0000000  0000000 0000000 0000000 Q000111Q  Diastolic BP 78 80  82 82 84 90  Wt. (Lbs)  204.08 194   206   BMI  32.94 kg/m2 31.31 kg/m2    33.25 kg/m2       Latest Ref Rng & Units 03/21/2022   12:00 AM 02/15/2022    1:20 PM  Foot/eye exam completion dates  Eye Exam No Retinopathy No Retinopathy       Foot Form Completion   Done     This result is from an external source.        Morbid obesity ([place])  Patient re-educated about  the importance of commitment to a  minimum of 150 minutes of exercise per week as able.  The importance of healthy food choices with portion control discussed, as well as eating regularly and within a 12 hour window most days. The need to choose "clean , green" food 50 to 75% of the time is discussed, as well as to make water the primary drink and set a goal of 64 ounces water daily.       09/14/2022    1:40 PM 07/16/2022   10:40 AM 05/29/2022    1:28 PM  Weight /BMI  Weight 204 lb 1.3 oz 194 lb 206 lb  Height 5\' 6"  (1.676 m) 5\' 6"  (1.676 m) 5\' 6"  (1.676 m)  BMI 32.94 kg/m2 31.31 kg/m2 33.25 kg/m2    Weight gain, needs to reduce caloric intake  GERD Controlled, no change in medication   Vaccination not carried out because of patient refusal Refusing vaccines still , re educated  Obesity (BMI 30.0-34.9)  Patient re-educated about  the importance of commitment to a  minimum of 150 minutes of exercise per week as able.  The importance of healthy food choices with portion control discussed, as well as eating regularly and within a 12 hour window most days. The need to choose "clean , green" food 50 to 75% of the time is discussed, as well as to make water the primary drink and set a goal of 64 ounces water daily.       09/14/2022    1:40 PM 07/16/2022   10:40 AM 05/29/2022    1:28 PM  Weight /BMI  Weight 204 lb 1.3 oz 194 lb 206 lb  Height 5\' 6"  (1.676 m) 5\' 6"  (1.676 m) 5\' 6"  (1.676 m)  BMI 32.94 kg/m2 31.31 kg/m2 33.25 kg/m2    Needs to redu ce calories, fats and sugars

## 2022-09-18 ENCOUNTER — Ambulatory Visit: Payer: 59 | Attending: Cardiovascular Disease | Admitting: *Deleted

## 2022-09-18 DIAGNOSIS — I272 Pulmonary hypertension, unspecified: Secondary | ICD-10-CM | POA: Diagnosis not present

## 2022-09-18 DIAGNOSIS — Z5181 Encounter for therapeutic drug level monitoring: Secondary | ICD-10-CM | POA: Diagnosis not present

## 2022-09-18 LAB — POCT INR: INR: 1.8 — AB (ref 2.0–3.0)

## 2022-09-18 NOTE — Patient Instructions (Addendum)
Description   Today take 1 tablet of warfarin then continue taking warfarin 1 tablet daily except for 1/2 on Tuesdays. Recheck INR in 5 weeks. [facility] [phone] or [phone]

## 2022-09-20 ENCOUNTER — Telehealth: Payer: Self-pay | Admitting: Family Medicine

## 2022-09-20 DIAGNOSIS — Z0279 Encounter for issue of other medical certificate: Secondary | ICD-10-CM

## 2022-09-20 NOTE — Telephone Encounter (Signed)
Surgery clearance  Copied Noted sleeved

## 2022-09-24 NOTE — Telephone Encounter (Signed)
Visit notes faxed [phone]

## 2022-09-27 DIAGNOSIS — H25041 Posterior subcapsular polar age-related cataract, right eye: Secondary | ICD-10-CM | POA: Diagnosis not present

## 2022-09-27 DIAGNOSIS — H2511 Age-related nuclear cataract, right eye: Secondary | ICD-10-CM | POA: Diagnosis not present

## 2022-10-11 DIAGNOSIS — Z86718 Personal history of other venous thrombosis and embolism: Secondary | ICD-10-CM | POA: Diagnosis not present

## 2022-10-11 DIAGNOSIS — Z79899 Other long term (current) drug therapy: Secondary | ICD-10-CM | POA: Diagnosis not present

## 2022-10-11 DIAGNOSIS — Z888 Allergy status to other drugs, medicaments and biological substances status: Secondary | ICD-10-CM | POA: Diagnosis not present

## 2022-10-11 DIAGNOSIS — K219 Gastro-esophageal reflux disease without esophagitis: Secondary | ICD-10-CM | POA: Diagnosis not present

## 2022-10-11 DIAGNOSIS — E7800 Pure hypercholesterolemia, unspecified: Secondary | ICD-10-CM | POA: Diagnosis not present

## 2022-10-11 DIAGNOSIS — H25811 Combined forms of age-related cataract, right eye: Secondary | ICD-10-CM | POA: Diagnosis not present

## 2022-10-11 DIAGNOSIS — Z7901 Long term (current) use of anticoagulants: Secondary | ICD-10-CM | POA: Diagnosis not present

## 2022-10-11 DIAGNOSIS — Z7984 Long term (current) use of oral hypoglycemic drugs: Secondary | ICD-10-CM | POA: Diagnosis not present

## 2022-10-11 DIAGNOSIS — E1136 Type 2 diabetes mellitus with diabetic cataract: Secondary | ICD-10-CM | POA: Diagnosis not present

## 2022-10-11 DIAGNOSIS — I10 Essential (primary) hypertension: Secondary | ICD-10-CM | POA: Diagnosis not present

## 2022-10-11 DIAGNOSIS — Z886 Allergy status to analgesic agent status: Secondary | ICD-10-CM | POA: Diagnosis not present

## 2022-10-23 ENCOUNTER — Ambulatory Visit: Payer: 59 | Attending: Cardiology | Admitting: *Deleted

## 2022-10-23 DIAGNOSIS — I4891 Unspecified atrial fibrillation: Secondary | ICD-10-CM

## 2022-10-23 DIAGNOSIS — Z5181 Encounter for therapeutic drug level monitoring: Secondary | ICD-10-CM

## 2022-10-23 LAB — POCT INR: INR: 1.2 — AB (ref 2.0–3.0)

## 2022-10-23 NOTE — Patient Instructions (Addendum)
Description   Today take 1 tablet of warfarin and tomorrow take 1.5 tablets of warfarin then START taking warfarin 1 tablet daily. Recheck INR in 1 week. [facility] [phone] or [phone]

## 2022-10-30 ENCOUNTER — Ambulatory Visit: Payer: 59 | Attending: Cardiovascular Disease | Admitting: *Deleted

## 2022-10-30 DIAGNOSIS — Z5181 Encounter for therapeutic drug level monitoring: Secondary | ICD-10-CM

## 2022-10-30 DIAGNOSIS — I4891 Unspecified atrial fibrillation: Secondary | ICD-10-CM

## 2022-10-30 LAB — POCT INR: INR: 1.6 — AB (ref 2.0–3.0)

## 2022-10-30 NOTE — Patient Instructions (Signed)
Description   Today take 1.5 tablets of warfarin then START taking warfarin 1 tablet daily except 1.5 tablets on Saturdays. Recheck INR in 9 days. [facility] [phone] or [phone]

## 2022-11-01 ENCOUNTER — Telehealth (INDEPENDENT_AMBULATORY_CARE_PROVIDER_SITE_OTHER): Payer: 59 | Admitting: Family Medicine

## 2022-11-01 DIAGNOSIS — J209 Acute bronchitis, unspecified: Secondary | ICD-10-CM

## 2022-11-01 DIAGNOSIS — J301 Allergic rhinitis due to pollen: Secondary | ICD-10-CM | POA: Diagnosis not present

## 2022-11-01 DIAGNOSIS — T7840XA Allergy, unspecified, initial encounter: Secondary | ICD-10-CM

## 2022-11-01 MED ORDER — PREDNISONE 5 MG PO TABS: 5.0000 mg | ORAL_TABLET | Freq: Two times a day (BID) | ORAL | 0 refills | Status: AC

## 2022-11-01 MED ORDER — DOXYCYCLINE HYCLATE 100 MG PO TABS
100.0000 mg | ORAL_TABLET | Freq: Two times a day (BID) | ORAL | 0 refills | Status: DC
Start: 2022-11-01 — End: 2023-03-22

## 2022-11-01 MED ORDER — LEVOCETIRIZINE DIHYDROCHLORIDE 5 MG PO TABS
5.0000 mg | ORAL_TABLET | Freq: Once | ORAL | 2 refills | Status: DC | PRN
Start: 2022-11-01 — End: 2023-06-03

## 2022-11-01 MED ORDER — PROMETHAZINE-DM 6.25-15 MG/5ML PO SYRP
ORAL_SOLUTION | ORAL | 0 refills | Status: DC
Start: 2022-11-01 — End: 2023-03-22

## 2022-11-01 MED ORDER — BENZONATATE 100 MG PO CAPS: 100.0000 mg | ORAL_CAPSULE | Freq: Two times a day (BID) | ORAL | 0 refills | Status: DC | PRN

## 2022-11-01 NOTE — Patient Instructions (Addendum)
Follow-up in 6 weeks, call if you need me sooner  You are treated for acute bronchitis and uncontrolled allergies.  Doxycycline, Tessalon Perles, prednisone, Phenergan DM, Xyzal are  prescribed.  As directed.  Please get an x-ray of your chest next week Monday at [facility] I have entered the order.  If your symptoms worsen shortness of breath or start having fever and chills and bodyaches then you do need to be evaluated at th nearest ED

## 2022-11-02 ENCOUNTER — Telehealth: Payer: Self-pay | Admitting: Family Medicine

## 2022-11-02 NOTE — Telephone Encounter (Signed)
Patient calling asking why did she received a bill for medical clearance form for $25.00Please call patient at [phone].

## 2022-11-04 ENCOUNTER — Encounter: Payer: Self-pay | Admitting: Family Medicine

## 2022-11-04 NOTE — Progress Notes (Signed)
Virtual Visit via Video Note  I connected with Stephanie Singleton on 11/01/2022 at  3:00 PM EDT by a video enabled telemedicine application and verified that I am speaking with the correct person using two identifiers.  Location: Patient: home Provider: office   I discussed the limitations of evaluation and management by telemedicine and the availability of in person appointments. The patient expressed understanding and agreed to proceed.  History of Present Illness: 1 week h/o increasing head and chest congestion with cough which prevents sleep and occurs throughout the day. She has had chills , no documented fever, has ben using multiple OTC meds with no relief  Denies shortness of breath C/o increase allergy symptoms which are excessive and uncontrolled in past 3 weeks   Observations/Objective: There were no vitals taken for this visit. Pt alert excessive coughing during the visit, head congestion with nasal drainage, looks ill, able to speak in sentences, no dyspnea noted  Assessment and Plan:  Allergic rhinitis Uncontrolled, short course of prednisone and phenergan DM for bedtime use as needed, iis prescribed, and start daily xyzal  Acute bronchitis Doxycycline and tessalon perles are prescribed, pt advised if she deteriorates or fails to improve over the next 3 to 5 days , need to go to the ED, de to advanced age and co morbidities she is at increased risk for complications  Follow Up Instructions:    I discussed the assessment and treatment plan with the patient. The patient was provided an opportunity to ask questions and all were answered. The patient agreed with the plan and demonstrated an understanding of the instructions.   The patient was advised to call back or seek an in-person evaluation if the symptoms worsen or if the condition fails to improve as anticipated.  I provided 15 minutes of non-face-to-face time during this encounter.   Syliva Overman, MD

## 2022-11-04 NOTE — Assessment & Plan Note (Addendum)
Doxycycline and tessalon perles are prescribed, pt advised if she deteriorates or fails to improve over the next 3 to 5 days , need to go to the ED, de to advanced age and co morbidities she is at increased risk for complications CXr to be done 11/05/2022

## 2022-11-04 NOTE — Assessment & Plan Note (Addendum)
Uncontrolled, short course of prednisone and phenergan DM for bedtime use as needed, iis prescribed, and start daily xyzal

## 2022-11-05 ENCOUNTER — Ambulatory Visit: Payer: 59 | Admitting: Physician Assistant

## 2022-11-05 NOTE — Telephone Encounter (Signed)
Forms filled by the provider are subject to a $25 charge. If there are further details to justify "no charge" then that information is required in this encounter. Communicate with patient regarding office policy.

## 2022-11-05 NOTE — Telephone Encounter (Signed)
Patient informed. 

## 2022-11-08 ENCOUNTER — Ambulatory Visit: Payer: 59

## 2022-11-08 ENCOUNTER — Telehealth: Payer: Self-pay | Admitting: *Deleted

## 2022-11-08 NOTE — Telephone Encounter (Signed)
Received a voicemail to call pt; she states she is not feeling well and need to cancel appt. Rescheduled her for next week and she will call if needed.

## 2022-11-14 ENCOUNTER — Ambulatory Visit: Payer: 59 | Attending: Cardiology | Admitting: *Deleted

## 2022-11-14 DIAGNOSIS — I4891 Unspecified atrial fibrillation: Secondary | ICD-10-CM

## 2022-11-14 DIAGNOSIS — Z5181 Encounter for therapeutic drug level monitoring: Secondary | ICD-10-CM | POA: Diagnosis not present

## 2022-11-14 LAB — POCT INR: INR: 1.7 — AB (ref 2.0–3.0)

## 2022-11-14 NOTE — Patient Instructions (Signed)
Description   Today take 1.5 tablets of warfarin then START taking warfarin 1 tablet daily except 1.5 tablets on Tuesdays and Saturdays. Recheck INR in 2 weeks. [facility] [phone] or [phone]

## 2022-11-28 ENCOUNTER — Ambulatory Visit: Payer: 59 | Attending: Cardiology

## 2022-11-28 DIAGNOSIS — Z5181 Encounter for therapeutic drug level monitoring: Secondary | ICD-10-CM | POA: Diagnosis not present

## 2022-11-28 DIAGNOSIS — I4891 Unspecified atrial fibrillation: Secondary | ICD-10-CM

## 2022-11-28 LAB — POCT INR: INR: 2.5 (ref 2.0–3.0)

## 2022-11-28 NOTE — Patient Instructions (Signed)
Description   Continue taking warfarin 1 tablet daily except 1.5 tablets on Tuesdays and Saturdays.  Recheck INR in 3 weeks.  [facility] [phone] or [phone]

## 2022-12-18 ENCOUNTER — Ambulatory Visit: Payer: 59 | Admitting: Nurse Practitioner

## 2022-12-19 ENCOUNTER — Ambulatory Visit: Payer: 59 | Attending: Internal Medicine | Admitting: *Deleted

## 2022-12-19 DIAGNOSIS — I272 Pulmonary hypertension, unspecified: Secondary | ICD-10-CM | POA: Diagnosis not present

## 2022-12-19 DIAGNOSIS — Z5181 Encounter for therapeutic drug level monitoring: Secondary | ICD-10-CM | POA: Diagnosis not present

## 2022-12-19 LAB — POCT INR: INR: 2.9 (ref 2.0–3.0)

## 2022-12-19 NOTE — Patient Instructions (Signed)
Description   Continue taking warfarin 1 tablet daily except 1.5 tablets on Tuesdays and Saturdays.  Recheck INR in 4 weeks.  [facility] [phone] or [phone]

## 2022-12-24 ENCOUNTER — Other Ambulatory Visit: Payer: Self-pay | Admitting: Family Medicine

## 2022-12-31 ENCOUNTER — Telehealth: Payer: Self-pay | Admitting: Family Medicine

## 2022-12-31 NOTE — Telephone Encounter (Signed)
Placard   Copied Noted Sleeved (put in provider box)  Call  patient when ready

## 2023-01-08 ENCOUNTER — Telehealth: Payer: Self-pay | Admitting: Family Medicine

## 2023-01-08 ENCOUNTER — Other Ambulatory Visit: Payer: Self-pay

## 2023-01-08 MED ORDER — OMEPRAZOLE 40 MG PO CPDR: 40.0000 mg | DELAYED_RELEASE_CAPSULE | Freq: Every day | ORAL | 3 refills | Status: DC

## 2023-01-08 NOTE — Telephone Encounter (Signed)
Prescription Request  01/08/2023  LOV: 09/14/2022  What is the name of the medication or equipment? omeprazole (PRILOSEC) 40 MG capsule   Have you contacted your pharmacy to request a refill? No   Which pharmacy would you like this sent to?   [id number] [place], [place] - [address] [facility] [address] [place] [address] Phone: [phone] Fax: [phone]      Patient notified that their request is being sent to the clinical staff for review and that they should receive a response within 2 business days.   Please advise at [facility] [phone]

## 2023-01-08 NOTE — Telephone Encounter (Signed)
Pt called in regard to form fee. States that she spoke with someone and should not have been charged. Patient keeps getting billed for it and would like it take off.

## 2023-01-08 NOTE — Telephone Encounter (Signed)
Refills sent

## 2023-01-09 ENCOUNTER — Encounter: Payer: Self-pay | Admitting: Physician Assistant

## 2023-01-10 NOTE — Telephone Encounter (Signed)
Charge removed.

## 2023-01-15 NOTE — Telephone Encounter (Signed)
Placard mailed to pt   Copy kept for back up

## 2023-01-16 ENCOUNTER — Ambulatory Visit: Payer: 59 | Attending: Cardiology

## 2023-01-16 DIAGNOSIS — Z5181 Encounter for therapeutic drug level monitoring: Secondary | ICD-10-CM | POA: Diagnosis not present

## 2023-01-16 DIAGNOSIS — I4891 Unspecified atrial fibrillation: Secondary | ICD-10-CM

## 2023-01-16 LAB — POCT INR: INR: 2.7 (ref 2.0–3.0)

## 2023-01-16 NOTE — Patient Instructions (Signed)
Description   Continue taking warfarin 1 tablet daily except 1.5 tablets on Tuesdays and Saturdays.  Recheck INR in 6 weeks.  [facility] [phone] or [phone]

## 2023-02-05 ENCOUNTER — Other Ambulatory Visit: Payer: Self-pay | Admitting: Family Medicine

## 2023-02-16 ENCOUNTER — Other Ambulatory Visit: Payer: Self-pay | Admitting: Family Medicine

## 2023-02-25 ENCOUNTER — Ambulatory Visit (INDEPENDENT_AMBULATORY_CARE_PROVIDER_SITE_OTHER): Payer: 59

## 2023-02-25 VITALS — BP 140/79 | Ht 66.0 in | Wt 200.0 lb

## 2023-02-25 DIAGNOSIS — Z0000 Encounter for general adult medical examination without abnormal findings: Secondary | ICD-10-CM | POA: Diagnosis not present

## 2023-02-25 NOTE — Progress Notes (Signed)
Because this visit was a virtual/telehealth visit,  certain criteria was not obtained, such a blood pressure, CBG if patient is a diabetic, and timed get up and go. Any medications not marked as "taking" was not mentioned during the medication reconciliation part of the visit. Any vitals not documented were not able to be obtained due to this being a telehealth visit. Vitals that have been documented are verbally provided by the patient.  Patient was unable to self-report a recent blood pressure reading due to a lack of equipment at home via telehealth.  Subjective:   Stephanie Singleton is a 77 y.o. female who presents for Medicare Annual (Subsequent) preventive examination.  Visit Complete: Virtual  I connected with  Barbar Amon on 02/25/23 by a audio enabled telemedicine application and verified that I am speaking with the correct person using two identifiers.  Patient Location: Home  Provider Location: Home Office  I discussed the limitations of evaluation and management by telemedicine. The patient expressed understanding and agreed to proceed.  Patient Medicare AWV questionnaire was completed by the patient on na; I have confirmed that all information answered by patient is correct and no changes since this date.  Review of Systems     Cardiac Risk Factors include: advanced age (>40men, >55 women);diabetes mellitus;dyslipidemia;hypertension;obesity (BMI >30kg/m2)     Objective:    Today's Vitals   02/25/23 1331  BP: (!) 140/79  Weight: 200 lb (90.7 kg)  Height: 5\' 6"  (1.676 m)   Body mass index is 32.28 kg/m.     02/25/2023    1:31 PM 02/19/2022    2:50 PM 01/24/2021    3:20 PM 01/15/2018    1:17 PM 08/07/2016    2:33 PM 10/31/2014    1:56 PM 09/07/2014    1:10 PM  Advanced Directives  Does Patient Have a Medical Advance Directive? No No No No No No No  Would patient like information on creating a medical advance directive? No - Patient declined No - Patient declined  Yes (MAU/Ambulatory/Procedural Areas - Information given) No - Patient declined No - Patient declined No - patient declined information Yes - Educational materials given    Current Medications (verified) Outpatient Encounter Medications as of 02/25/2023  Medication Sig   ACCU-CHEK AVIVA PLUS test strip USE TO TEST ONCE DAILY   Accu-Chek FastClix Lancets MISC USE 1 UNIT 2 (TWO) TIMES A DAY.   benzonatate (TESSALON) 100 MG capsule Take 1 capsule (100 mg total) by mouth 2 (two) times daily as needed for cough.   carvedilol (COREG) 3.125 MG tablet TAKE 1 TABLET BY MOUTH TWICE A DAY WITH FOOD   doxycycline (VIBRA-TABS) 100 MG tablet Take 1 tablet (100 mg total) by mouth 2 (two) times daily.   levocetirizine (XYZAL) 5 MG tablet Take 1 tablet (5 mg total) by mouth once as needed for up to 1 dose for allergies.   linagliptin (TRADJENTA) 5 MG TABS tablet Take 1 tablet (5 mg total) by mouth daily.   olmesartan-hydrochlorothiazide (BENICAR HCT) 40-25 MG tablet TAKE 1 TABLET BY MOUTH EVERY DAY   omeprazole (PRILOSEC) 40 MG capsule Take 1 capsule (40 mg total) by mouth daily.   pantoprazole (PROTONIX) 20 MG tablet Take 1 tablet (20 mg total) by mouth daily.   potassium chloride SA (KLOR-CON M20) 20 MEQ tablet TAKE 1 TABLET BY MOUTH DAILY, TAKE 1 EXTRA TABLET BY MOUTH DAILY ONLY ON DAYS YOU TAKE A TORSEMIDE   pravastatin (PRAVACHOL) 20 MG tablet TAKE 1 TABLET BY MOUTH  EVERY DAY   promethazine-dextromethorphan (PROMETHAZINE-DM) 6.25-15 MG/5ML syrup Take one teaspoon by mouth twice daily, as needed, for excess cough   torsemide (DEMADEX) 20 MG tablet TAKE 1 TABLET 2 TIMES A WEEK AS NEEDED FOR SWELLING. Please make overdue appt with Dr. Delton See before anymore refills. 1st attempt   warfarin (COUMADIN) 2.5 MG tablet TAKE 1 TABLET BY MOUTH DAILY OR AS DIRECTED BY COUMADIN CLINIC   No facility-administered encounter medications on file as of 02/25/2023.    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   Tumor of soft tissue of neck    Past Surgical History:  Procedure Laterality Date   CERVICAL FUSION     Family History  Problem Relation Age of Onset   Heart disease Mother    Hypertension Mother    Stroke Mother    Diabetes Father    Peripheral vascular disease Father    Peripheral vascular disease Brother    Heart disease Brother    Bladder Cancer Sister 2   Diabetes Daughter        3 of the 5 daughters   Diabetes Son        3 of the sons   Breast cancer Sister 71   Liver disease Son    Colon cancer Neg Hx    Pancreatic cancer Neg Hx    Stomach cancer Neg Hx    Esophageal cancer Neg Hx    Social History   Socioeconomic History   Marital status: Widowed    Spouse name: Not on file   Number of children: 13   Years of education: Not on file   Highest education level: Some college, no degree  Occupational History    Employer: DISABLED     Comment: [employer]  Tobacco Use   Smoking status: Former    Current packs/day: 0.00    Average packs/day: 0.3 packs/day for 20.0 years (5.0 ttl pk-yrs)    Types: Cigarettes    Start date: 08/07/1976    Quit date: 08/07/1996    Years since quitting: 26.5   Smokeless tobacco: Never  Substance and Sexual Activity   Alcohol use: No    Comment: upper limits of week, 1-2 daily-  until 07/2018, no longer having alcohol per patient   Drug use: No   Sexual activity: Yes  Other Topics Concern   Not on file  Social History Narrative   Husband passed away in 03-23-2022. Patient lives alone.    Social Determinants of Health   Financial Resource Strain: Low Risk  (02/25/2023)   Overall Financial Resource Strain (CARDIA)    Difficulty of Paying Living Expenses: Not hard at all  Food Insecurity: No Food Insecurity (02/25/2023)  Hunger Vital Sign    Worried About Running Out of Food in the Last Year: Never true    Ran Out of Food in the Last Year: Never true  Transportation Needs: No Transportation Needs (02/25/2023)   PRAPARE - Administrator, Civil Service (Medical): No    Lack of Transportation (Non-Medical): No  Physical Activity: Sufficiently Active (02/25/2023)   Exercise Vital Sign    Days of Exercise per Week: 7 days    Minutes of Exercise per Session: 30 min  Stress: No Stress Concern Present (02/25/2023)   [employer] of Occupational Health - Occupational Stress Questionnaire    Feeling of Stress : Not at all  Social Connections: Moderately Isolated (02/25/2023)   Social Connection and Isolation Panel [NHANES]    Frequency of Communication with Friends and Family: More than three times a week    Frequency of Social Gatherings with Friends and Family: More than three times a week    Attends Religious Services: More than 4 times per year    Active Member of [employer] or Organizations: No    Attends Banker Meetings: Never    Marital Status: Widowed    Tobacco Counseling Counseling given: Yes   Clinical Intake:  Pre-visit preparation completed: Yes  Pain : No/denies pain     BMI - recorded: 32.28 Nutritional Status: BMI > 30  Obese Nutritional Risks: Unintentional weight loss Diabetes: Yes CBG done?: No (telehealth visit. unable to obtain bg. patient states she checks her bg's daily) Did pt. bring in CBG monitor from home?:  No  How often do you need to have someone help you when you read instructions, pamphlets, or other written materials from your doctor or pharmacy?: 1 - Never  Interpreter Needed?: No  Information entered by :: Abby Chavonne Sforza, CMA   Activities of Daily Living    02/25/2023    1:41 PM  In your present state of health, do you have any difficulty performing the following activities:  Hearing? 0  Vision? 0  Difficulty concentrating or making decisions? 0  Walking or climbing stairs? 0  Dressing or bathing? 0  Doing errands, shopping? 0  Preparing Food and eating ? N  Using the Toilet? N  In the past six months, have you accidently leaked urine? N  Do you have problems with loss of bowel control? N  Managing your Medications? N  Managing your Finances? N  Housekeeping or managing your Housekeeping? N    Patient Care Team: Kerri Perches, MD as PCP - General Shari Prows Kathlynn Grate, MD as PCP - Cardiology (Cardiology) Rothbart, Gerrit Friends, MD (Inactive) (Cardiology) Vivi Barrack, DPM as Consulting Physician (Podiatry) Coletta Memos, MD as Consulting Physician (Neurosurgery)  Indicate any recent Medical Services you may have received from other than [facility] providers in the past year (date may be approximate).     Assessment:   This is a routine wellness examination for Loralie.  Hearing/Vision screen Hearing Screening - Comments:: Patient denies any hearing difficulties.   Vision Screening - Comments:: Wears rx glasses - up to date with routine eye exams with Dr. Lynden Oxford  Dietary issues and exercise activities discussed:     Goals Addressed               This Visit's Progress     Patient Stated (pt-stated)        To live and be healthy        Depression Screen  02/25/2023    1:37 PM 09/14/2022    1:42 PM 05/29/2022    1:31 PM 04/19/2022   11:42 AM 03/30/2022    1:25 PM 02/19/2022    2:41 PM 02/15/2022    1:29 PM  PHQ 2/9 Scores  PHQ - 2 Score 0 0  0 0 0 0 0    Fall Risk    02/25/2023    1:41 PM 09/14/2022    1:42 PM 05/29/2022    1:31 PM 04/19/2022   11:42 AM 03/30/2022    1:25 PM  Fall Risk   Falls in the past year? 0 0 0 0 0  Number falls in past yr: 0 0 0 0 0  Injury with Fall? 0 0 0 0 0  Risk for fall due to : No Fall Risks No Fall Risks No Fall Risks No Fall Risks No Fall Risks  Follow up Falls prevention discussed Falls evaluation completed Falls evaluation completed Falls evaluation completed Falls evaluation completed    MEDICARE RISK AT HOME: Medicare Risk at Home Any stairs in or around the home?: No If so, are there any without handrails?: No Home free of loose throw rugs in walkways, pet beds, electrical cords, etc?: Yes Adequate lighting in your home to reduce risk of falls?: Yes Life alert?: No Use of a cane, walker or w/c?: No Grab bars in the bathroom?: Yes Shower chair or bench in shower?: No Elevated toilet seat or a handicapped toilet?: Yes  TIMED UP AND GO:  Was the test performed?  No    Cognitive Function:    01/24/2021    3:22 PM  MMSE - Mini Mental State Exam  Not completed: Unable to complete        02/25/2023    1:37 PM 02/19/2022    2:42 PM 01/24/2021    3:22 PM 01/21/2020   10:53 AM 01/20/2019    1:15 PM  6CIT Screen  What Year? 0 points 0 points 0 points 0 points 0 points  What month? 0 points 0 points 0 points 0 points 0 points  What time? 0 points 0 points 0 points 0 points 0 points  Count back from 20 0 points 0 points 0 points 0 points 0 points  Months in reverse 0 points 0 points 0 points 0 points 0 points  Repeat phrase 0 points 0 points 0 points 0 points 0 points  Total Score 0 points 0 points 0 points 0 points 0 points    Immunizations Immunization History  Administered Date(s) Administered   Td 01/03/2005    TDAP status: Due, Education has been provided regarding the importance of this vaccine. Advised may receive this vaccine at local pharmacy or Health Dept. Aware  to provide a copy of the vaccination record if obtained from local pharmacy or Health Dept. Verbalized acceptance and understanding.  Flu Vaccine status: Declined, Education has been provided regarding the importance of this vaccine but patient still declined. Advised may receive this vaccine at local pharmacy or Health Dept. Aware to provide a copy of the vaccination record if obtained from local pharmacy or Health Dept. Verbalized acceptance and understanding.  Pneumococcal vaccine status: Declined,  Education has been provided regarding the importance of this vaccine but patient still declined. Advised may receive this vaccine at local pharmacy or Health Dept. Aware to provide a copy of the vaccination record if obtained from local pharmacy or Health Dept. Verbalized acceptance and understanding.   Covid-19 vaccine status: Declined, Education has  been provided regarding the importance of this vaccine but patient still declined. Advised may receive this vaccine at local pharmacy or Health Dept.or vaccine clinic. Aware to provide a copy of the vaccination record if obtained from local pharmacy or Health Dept. Verbalized acceptance and understanding.  Qualifies for Shingles Vaccine? No   Zostavax completed No   Shingrix Completed?: No.    Education has been provided regarding the importance of this vaccine. Patient has been advised to call insurance company to determine out of pocket expense if they have not yet received this vaccine. Advised may also receive vaccine at local pharmacy or Health Dept. Verbalized acceptance and understanding.  Screening Tests Health Maintenance  Topic Date Due   Zoster Vaccines- Shingrix (1 of 2) Never done   Pneumonia Vaccine 82+ Years old (1 of 1 - PCV) Never done   DTaP/Tdap/Td (2 - Tdap) 01/04/2015   Medicare Annual Wellness (AWV)  02/20/2023   INFLUENZA VACCINE  01/31/2023   FOOT EXAM  02/16/2023   Diabetic kidney evaluation - Urine ACR  02/27/2023    HEMOGLOBIN A1C  03/17/2023   OPHTHALMOLOGY EXAM  03/22/2023   Diabetic kidney evaluation - eGFR measurement  09/14/2023   Colonoscopy  12/08/2023   DEXA SCAN  Completed   Hepatitis C Screening  Completed   HPV VACCINES  Aged Out   COVID-19 Vaccine  Discontinued    Health Maintenance  Health Maintenance Due  Topic Date Due   Zoster Vaccines- Shingrix (1 of 2) Never done   Pneumonia Vaccine 60+ Years old (1 of 1 - PCV) Never done   DTaP/Tdap/Td (2 - Tdap) 01/04/2015   Medicare Annual Wellness (AWV)  02/20/2023   INFLUENZA VACCINE  01/31/2023   FOOT EXAM  02/16/2023   Diabetic kidney evaluation - Urine ACR  02/27/2023    Colorectal cancer screening: Type of screening: Colonoscopy. Completed 12/07/2013. Repeat every 10 years  Mammogram status: Completed 03/08/2022. Repeat every year  Bone Density Status: patient declined  Lung Cancer Screening: (Low Dose CT Chest recommended if Age 37-80 years, 20 pack-year currently smoking OR have quit w/in 15years.) does not qualify.   Lung Cancer Screening Referral: na  Additional Screening:  Hepatitis C Screening: does not qualify; Completed 09/28/2014  Vision Screening: Recommended annual ophthalmology exams for early detection of glaucoma and other disorders of the eye. Is the patient up to date with their annual eye exam?  Yes  Who is the provider or what is the name of the office in which the patient attends annual eye exams? Dr. Lynden Oxford If pt is not established with a provider, would they like to be referred to a provider to establish care? No .   Dental Screening: Recommended annual dental exams for proper oral hygiene  Diabetic Foot Exam: Diabetic Foot Exam: Overdue, Pt has been advised about the importance in completing this exam. Pt is scheduled for diabetic foot exam on provider notified.  Community Resource Referral / Chronic Care Management: CRR required this visit?  No   CCM required this visit?  No     Plan:      I have personally reviewed and noted the following in the patient's chart:   Medical and social history Use of alcohol, tobacco or illicit drugs  Current medications and supplements including opioid prescriptions. Patient is not currently taking opioid prescriptions. Functional ability and status Nutritional status Physical activity Advanced directives List of other physicians Hospitalizations, surgeries, and ER visits in previous 12 months Vitals Screenings to  include cognitive, depression, and falls Referrals and appointments  In addition, I have reviewed and discussed with patient certain preventive protocols, quality metrics, and best practice recommendations. A written personalized care plan for preventive services as well as general preventive health recommendations were provided to patient.     Jordan Hawks Ardelle Haliburton, CMA   02/25/2023   After Visit Summary: (MyChart) Due to this being a telephonic visit, the after visit summary with patients personalized plan was offered to patient via MyChart   Nurse Notes:

## 2023-02-25 NOTE — Patient Instructions (Signed)
Stephanie Singleton , Thank you for taking time to come for your Medicare Wellness Visit. I appreciate your ongoing commitment to your health goals. Please review the following plan we discussed and let me know if I can assist you in the future.   Referrals/Orders/Follow-Ups/Clinician Recommendations:  Vaccinations: declines all Influenza vaccine: recommend every Fall Pneumococcal vaccine: recommend once per lifetime Prevnar-20 Tdap vaccine: recommend every 10 years Shingles vaccine: recommend Shingrix which is 2 doses 2-6 months apart and over 90% effective     Covid-19: recommend 2 doses one month apart with a booster 6 months later   This is a list of the screening recommended for you and due dates:  Health Maintenance  Topic Date Due   Zoster (Shingles) Vaccine (1 of 2) Never done   Pneumonia Vaccine (1 of 1 - PCV) Never done   DTaP/Tdap/Td vaccine (2 - Tdap) 01/04/2015   Flu Shot  01/31/2023   Complete foot exam   02/16/2023   Yearly kidney health urinalysis for diabetes  02/27/2023   Mammogram  03/09/2023   Hemoglobin A1C  03/17/2023   Eye exam for diabetics  03/22/2023   Yearly kidney function blood test for diabetes  09/14/2023   Colon Cancer Screening  12/08/2023   Medicare Annual Wellness Visit  02/25/2024   DEXA scan (bone density measurement)  Completed   Hepatitis C Screening  Completed   HPV Vaccine  Aged Out   COVID-19 Vaccine  Discontinued    Advanced directives: (Declined) Advance directive discussed with you today. Even though you declined this today, please call our office should you change your mind, and we can give you the proper paperwork for you to fill out.  Next Medicare Annual Wellness Visit scheduled for next year: Yes Preventive Care 87 Years and Older, Female Preventive care refers to lifestyle choices and visits with your health care provider that can promote health and wellness. Preventive care visits are also called wellness exams. What can I expect  for my preventive care visit? Counseling Your health care provider may ask you questions about your: Medical history, including: Past medical problems. Family medical history. Pregnancy and menstrual history. History of falls. Current health, including: Memory and ability to understand (cognition). Emotional well-being. Home life and relationship well-being. Sexual activity and sexual health. Lifestyle, including: Alcohol, nicotine or tobacco, and drug use. Access to firearms. Diet, exercise, and sleep habits. Work and work Astronomer. Sunscreen use. Safety issues such as seatbelt and bike helmet use. Physical exam Your health care provider will check your: Height and weight. These may be used to calculate your BMI (body mass index). BMI is a measurement that tells if you are at a healthy weight. Waist circumference. This measures the distance around your waistline. This measurement also tells if you are at a healthy weight and may help predict your risk of certain diseases, such as type 2 diabetes and high blood pressure. Heart rate and blood pressure. Body temperature. Skin for abnormal spots. What immunizations do I need?  Vaccines are usually given at various ages, according to a schedule. Your health care provider will recommend vaccines for you based on your age, medical history, and lifestyle or other factors, such as travel or where you work. What tests do I need? Screening Your health care provider may recommend screening tests for certain conditions. This may include: Lipid and cholesterol levels. Hepatitis C test. Hepatitis B test. HIV (human immunodeficiency virus) test. STI (sexually transmitted infection) testing, if you are at risk. Lung  cancer screening. Colorectal cancer screening. Diabetes screening. This is done by checking your blood sugar (glucose) after you have not eaten for a while (fasting). Mammogram. Talk with your health care provider about how  often you should have regular mammograms. BRCA-related cancer screening. This may be done if you have a family history of breast, ovarian, tubal, or peritoneal cancers. Bone density scan. This is done to screen for osteoporosis. Talk with your health care provider about your test results, treatment options, and if necessary, the need for more tests. Follow these instructions at home: Eating and drinking  Eat a diet that includes fresh fruits and vegetables, whole grains, lean protein, and low-fat dairy products. Limit your intake of foods with high amounts of sugar, saturated fats, and salt. Take vitamin and mineral supplements as recommended by your health care provider. Do not drink alcohol if your health care provider tells you not to drink. If you drink alcohol: Limit how much you have to 0-1 drink a day. Know how much alcohol is in your drink. In the [place] one drink equals one 12 oz bottle of beer (355 mL), one 5 oz glass of wine (148 mL), or one 1 oz glass of hard liquor (44 mL). Lifestyle Brush your teeth every morning and night with fluoride toothpaste. Floss one time each day. Exercise for at least 30 minutes 5 or more days each week. Do not use any products that contain nicotine or tobacco. These products include cigarettes, chewing tobacco, and vaping devices, such as e-cigarettes. If you need help quitting, ask your health care provider. Do not use drugs. If you are sexually active, practice safe sex. Use a condom or other form of protection in order to prevent STIs. Take aspirin only as told by your health care provider. Make sure that you understand how much to take and what form to take. Work with your health care provider to find out whether it is safe and beneficial for you to take aspirin daily. Ask your health care provider if you need to take a cholesterol-lowering medicine (statin). Find healthy ways to manage stress, such as: Meditation, yoga, or listening to  music. Journaling. Talking to a trusted person. Spending time with friends and family. Minimize exposure to UV radiation to reduce your risk of skin cancer. Safety Always wear your seat belt while driving or riding in a vehicle. Do not drive: If you have been drinking alcohol. Do not ride with someone who has been drinking. When you are tired or distracted. While texting. If you have been using any mind-altering substances or drugs. Wear a helmet and other protective equipment during sports activities. If you have firearms in your house, make sure you follow all gun safety procedures. What's next? Visit your health care provider once a year for an annual wellness visit. Ask your health care provider how often you should have your eyes and teeth checked. Stay up to date on all vaccines. This information is not intended to replace advice given to you by your health care provider. Make sure you discuss any questions you have with your health care provider. Document Revised: 12/14/2020 Document Reviewed: 12/14/2020 Elsevier Patient Education  2024 [employer]. Understanding Your Risk for Falls Millions of people have serious injuries from falls each year. It is important to understand your risk of falling. Talk with your health care provider about your risk and what you can do to lower it. If you do have a serious fall, make sure to tell your  provider. Falling once raises your risk of falling again. How can falls affect me? Serious injuries from falls are common. These include: Broken bones, such as hip fractures. Head injuries, such as traumatic brain injuries (TBI) or concussions. A fear of falling can cause you to avoid activities and stay at home. This can make your muscles weaker and raise your risk for a fall. What can increase my risk? There are a number of risk factors that increase your risk for falling. The more risk factors you have, the higher your risk of falling. Serious  injuries from a fall happen most often to people who are older than 75 years old. Teenagers and young adults ages 27-29 are also at higher risk. Common risk factors include: Weakness in the lower body. Being generally weak or confused due to long-term (chronic) illness. Dizziness or balance problems. Poor vision. Medicines that cause dizziness or drowsiness. These may include: Medicines for your blood pressure, heart, anxiety, insomnia, or swelling (edema). Pain medicines. Muscle relaxants. Other risk factors include: Drinking alcohol. Having had a fall in the past. Having foot pain or wearing improper footwear. Working at a dangerous job. Having any of the following in your home: Tripping hazards, such as floor clutter or loose rugs. Poor lighting. Pets. Having dementia or memory loss. What actions can I take to lower my risk of falling?     Physical activity Stay physically fit. Do strength and balance exercises. Consider taking a regular class to build strength and balance. Yoga and tai chi are good options. Vision Have your eyes checked every year and your prescription for glasses or contacts updated as needed. Shoes and walking aids Wear non-skid shoes. Wear shoes that have rubber soles and low heels. Do not wear high heels. Do not walk around the house in socks or slippers. Use a cane or walker as told by your provider. Home safety Attach secure railings on both sides of your stairs. Install grab bars for your bathtub, shower, and toilet. Use a non-skid mat in your bathtub or shower. Attach bath mats securely with double-sided, non-slip rug tape. Use good lighting in all rooms. Keep a flashlight near your bed. Make sure there is a clear path from your bed to the bathroom. Use night-lights. Do not use throw rugs. Make sure all carpeting is taped or tacked down securely. Remove all clutter from walkways and stairways, including extension cords. Repair uneven or broken  steps and floors. Avoid walking on icy or slippery surfaces. Walk on the grass instead of on icy or slick sidewalks. Use ice melter to get rid of ice on walkways in the winter. Use a cordless phone. Questions to ask your health care provider Can you help me check my risk for a fall? Do any of my medicines make me more likely to fall? Should I take a vitamin D supplement? What exercises can I do to improve my strength and balance? Should I make an appointment to have my vision checked? Do I need a bone density test to check for weak bones (osteoporosis)? Would it help to use a cane or a walker? Where to find more information Centers for Disease Control and Prevention, STEAD[employer]: TonerPromos.no Community-Based Fall Prevention Programs: TonerPromos.no [employer] on Aging: BaseRingTones.pl Contact a health care provider if: You fall at home. You are afraid of falling at home. You feel weak, drowsy, or dizzy. This information is not intended to replace advice given to you by your health care provider. Make sure you  discuss any questions you have with your health care provider. Document Revised: 02/19/2022 Document Reviewed: 02/19/2022 Elsevier Patient Education  2024 [employer].

## 2023-02-27 ENCOUNTER — Ambulatory Visit: Payer: 59 | Attending: Cardiology | Admitting: *Deleted

## 2023-02-27 DIAGNOSIS — Z5181 Encounter for therapeutic drug level monitoring: Secondary | ICD-10-CM | POA: Diagnosis not present

## 2023-02-27 DIAGNOSIS — I4891 Unspecified atrial fibrillation: Secondary | ICD-10-CM | POA: Diagnosis not present

## 2023-02-27 LAB — POCT INR: INR: 3.6 — AB (ref 2.0–3.0)

## 2023-02-27 NOTE — Patient Instructions (Signed)
Description   Do not take any warfarin tomorrow then continue taking warfarin 1 tablet daily except 1.5 tablets on Tuesdays and Saturdays.  Recheck INR in 4 weeks.  [facility] [phone] or [phone]

## 2023-03-15 ENCOUNTER — Encounter: Payer: Self-pay | Admitting: Pharmacist

## 2023-03-22 ENCOUNTER — Other Ambulatory Visit: Payer: Self-pay | Admitting: Family Medicine

## 2023-03-22 ENCOUNTER — Ambulatory Visit: Payer: 59 | Admitting: Family Medicine

## 2023-03-22 ENCOUNTER — Encounter: Payer: Self-pay | Admitting: Family Medicine

## 2023-03-22 VITALS — BP 145/84 | HR 69 | Ht 66.0 in | Wt 206.1 lb

## 2023-03-22 DIAGNOSIS — I5032 Chronic diastolic (congestive) heart failure: Secondary | ICD-10-CM | POA: Diagnosis not present

## 2023-03-22 DIAGNOSIS — E785 Hyperlipidemia, unspecified: Secondary | ICD-10-CM | POA: Diagnosis not present

## 2023-03-22 DIAGNOSIS — I10 Essential (primary) hypertension: Secondary | ICD-10-CM | POA: Diagnosis not present

## 2023-03-22 DIAGNOSIS — F5104 Psychophysiologic insomnia: Secondary | ICD-10-CM

## 2023-03-22 DIAGNOSIS — Z1231 Encounter for screening mammogram for malignant neoplasm of breast: Secondary | ICD-10-CM

## 2023-03-22 DIAGNOSIS — G4700 Insomnia, unspecified: Secondary | ICD-10-CM

## 2023-03-22 DIAGNOSIS — E669 Obesity, unspecified: Secondary | ICD-10-CM

## 2023-03-22 DIAGNOSIS — E559 Vitamin D deficiency, unspecified: Secondary | ICD-10-CM

## 2023-03-22 DIAGNOSIS — I110 Hypertensive heart disease with heart failure: Secondary | ICD-10-CM

## 2023-03-22 DIAGNOSIS — K8010 Calculus of gallbladder with chronic cholecystitis without obstruction: Secondary | ICD-10-CM

## 2023-03-22 DIAGNOSIS — N2889 Other specified disorders of kidney and ureter: Secondary | ICD-10-CM | POA: Diagnosis not present

## 2023-03-22 DIAGNOSIS — Z2821 Immunization not carried out because of patient refusal: Secondary | ICD-10-CM

## 2023-03-22 DIAGNOSIS — I77819 Aortic ectasia, unspecified site: Secondary | ICD-10-CM | POA: Diagnosis not present

## 2023-03-22 DIAGNOSIS — E1121 Type 2 diabetes mellitus with diabetic nephropathy: Secondary | ICD-10-CM

## 2023-03-22 MED ORDER — HYDROXYZINE HCL 10 MG PO TABS: ORAL_TABLET | ORAL | 3 refills | Status: DC

## 2023-03-22 MED ORDER — OMEPRAZOLE 20 MG PO CPDR: 20.0000 mg | DELAYED_RELEASE_CAPSULE | Freq: Every day | ORAL | 3 refills | Status: DC

## 2023-03-22 MED ORDER — TORSEMIDE 20 MG PO TABS: ORAL_TABLET | ORAL | 2 refills | Status: DC

## 2023-03-22 NOTE — Patient Instructions (Addendum)
Annual exam first week in January, call if you need me sooner  Pls schedule mammogram at breast center at checkout  You are being referred to vascular surgery and Cardiologuy  New for sleep is hydroxyzine 10 mg one to two at bedtime  Commit to daily omeprazole and xyzall to see if this reduces the tickle and drainage in your throat  Labs today, lipid, cmp and eGFR, HBA1C, TSH, Vit D urine aCR  Nurse pls send for diabetic eye exam  It is important that you exercise regularly at least 30 minutes 5 times a week. If you develop chest pain, have severe difficulty breathing, or feel very tired, stop exercising immediately and seek medical attention   Thanks for choosing  [facility], we consider it a privelige to serve you.

## 2023-03-24 ENCOUNTER — Encounter: Payer: Self-pay | Admitting: Family Medicine

## 2023-03-24 ENCOUNTER — Telehealth: Payer: Self-pay | Admitting: Family Medicine

## 2023-03-24 DIAGNOSIS — K8020 Calculus of gallbladder without cholecystitis without obstruction: Secondary | ICD-10-CM | POA: Insufficient documentation

## 2023-03-24 DIAGNOSIS — I5030 Unspecified diastolic (congestive) heart failure: Secondary | ICD-10-CM | POA: Insufficient documentation

## 2023-03-24 LAB — LIPID PANEL
Chol/HDL Ratio: 4.6 ratio — ABNORMAL HIGH (ref 0.0–4.4)
Cholesterol, Total: 218 mg/dL — ABNORMAL HIGH (ref 100–199)
HDL: 47 mg/dL (ref >39)
LDL Chol Calc (NIH): 148 mg/dL — ABNORMAL HIGH (ref 0–99)
Triglycerides: 128 mg/dL (ref 0–149)
VLDL Cholesterol Cal: 23 mg/dL (ref 5–40)

## 2023-03-24 LAB — VITAMIN D 25 HYDROXY (VIT D DEFICIENCY, FRACTURES): Vit D, 25-Hydroxy: 18.5 ng/mL — ABNORMAL LOW (ref 30.0–100.0)

## 2023-03-24 LAB — CMP14+EGFR
ALT: 12 IU/L (ref 0–32)
AST: 17 IU/L (ref 0–40)
Albumin: 4.3 g/dL (ref 3.8–4.8)
Alkaline Phosphatase: 121 IU/L (ref 44–121)
BUN/Creatinine Ratio: 17 (ref 12–28)
BUN: 16 mg/dL (ref 8–27)
Bilirubin Total: 0.5 mg/dL (ref 0.0–1.2)
CO2: 26 mmol/L (ref 20–29)
Calcium: 9.9 mg/dL (ref 8.7–10.3)
Chloride: 98 mmol/L (ref 96–106)
Creatinine, Ser: 0.95 mg/dL (ref 0.57–1.00)
Globulin, Total: 3.1 g/dL (ref 1.5–4.5)
Glucose: 99 mg/dL (ref 70–99)
Potassium: 4.1 mmol/L (ref 3.5–5.2)
Sodium: 139 mmol/L (ref 134–144)
Total Protein: 7.4 g/dL (ref 6.0–8.5)
eGFR: 62 mL/min/{1.73_m2} (ref >59)

## 2023-03-24 LAB — HEMOGLOBIN A1C
Est. average glucose Bld gHb Est-mCnc: 154 mg/dL
Hgb A1c MFr Bld: 7 % — ABNORMAL HIGH (ref 4.8–5.6)

## 2023-03-24 LAB — MICROALBUMIN / CREATININE URINE RATIO
Creatinine, Urine: 49.1 mg/dL
Microalb/Creat Ratio: 9 mg/g{creat} (ref 0–29)
Microalbumin, Urine: 4.4 ug/mL

## 2023-03-24 LAB — TSH: TSH: 1.08 u[IU]/mL (ref 0.450–4.500)

## 2023-03-24 MED ORDER — VITAMIN D (ERGOCALCIFEROL) 1.25 MG (50000 UNIT) PO CAPS: 50000.0000 [IU] | ORAL_CAPSULE | ORAL | 8 refills | Status: AC

## 2023-03-24 MED ORDER — ROSUVASTATIN CALCIUM 20 MG PO TABS: 20.0000 mg | ORAL_TABLET | Freq: Every day | ORAL | 3 refills | Status: DC

## 2023-03-24 NOTE — Assessment & Plan Note (Signed)
  Patient re-educated about  the importance of commitment to a  minimum of 150 minutes of exercise per week as able.  The importance of healthy food choices with portion control discussed, as well as eating regularly and within a 12 hour window most days. The need to choose "clean , green" food 50 to 75% of the time is discussed, as well as to make water the primary drink and set a goal of 64 ounces water daily.       03/22/2023    1:15 PM 02/25/2023    1:31 PM 09/14/2022    1:40 PM  Weight /BMI  Weight 206 lb 1.3 oz 200 lb 204 lb 1.3 oz  Height 5\' 6"  (1.676 m) 5\' 6"  (1.676 m) 5\' 6"  (1.676 m)  BMI 33.26 kg/m2 32.28 kg/m2 32.94 kg/m2

## 2023-03-24 NOTE — Assessment & Plan Note (Signed)
Controlled, no change in medication DASH diet and commitment to daily physical activity for a minimum of 30 minutes discussed and encouraged, as a part of hypertension management. The importance of attaining a healthy weight is also discussed.     03/22/2023    1:17 PM 03/22/2023    1:15 PM 02/25/2023    1:31 PM 09/14/2022    2:40 PM 09/14/2022    1:40 PM 07/16/2022   10:40 AM 05/29/2022    2:27 PM  BP/Weight  Systolic BP 145 148 140 120 138  160  Diastolic BP 84 85 79 78 80  82  Wt. (Lbs)  206.08 200  204.08 194   BMI  33.26 kg/m2 32.28 kg/m2  32.94 kg/m2 31.31 kg/m2

## 2023-03-24 NOTE — Assessment & Plan Note (Addendum)
F/u with cardiology past due, will refer

## 2023-03-24 NOTE — Assessment & Plan Note (Signed)
Uncontrolled, resists med imcrease due to poor sleep , and will also work on diet, exercise and low salt diet DASH diet and commitment to daily physical activity for a minimum of 30 minutes discussed and encouraged, as a part of hypertension management. The importance of attaining a healthy weight is also discussed.     03/22/2023    1:17 PM 03/22/2023    1:15 PM 02/25/2023    1:31 PM 09/14/2022    2:40 PM 09/14/2022    1:40 PM 07/16/2022   10:40 AM 05/29/2022    2:27 PM  BP/Weight  Systolic BP 145 148 140 120 138  160  Diastolic BP 84 85 79 78 80  82  Wt. (Lbs)  206.08 200  204.08 194   BMI  33.26 kg/m2 32.28 kg/m2  32.94 kg/m2 31.31 kg/m2

## 2023-03-24 NOTE — Assessment & Plan Note (Signed)
Rpt [place] of kidneys also because of HTN

## 2023-03-24 NOTE — Assessment & Plan Note (Signed)
Offered , recommended and again  refuses all immunizations indicated

## 2023-03-24 NOTE — Assessment & Plan Note (Signed)
Rept echo and eval by cardiollogy past due, will refer

## 2023-03-24 NOTE — Assessment & Plan Note (Signed)
Sleep hygiene reviewed and written information offered also. Prescription sent for  medication needed. Start hydroxyzine and reduce caffeine

## 2023-03-24 NOTE — Telephone Encounter (Signed)
Pls review results with her s new meds are prescribed, she also needs to get fasting f/u labs for her Jan appt Also advise her that I referred her to Cardiology as well as for [place] of kidneys, has possible kidney cyst and should get f/u imaging( I did not discuss that at the visit)

## 2023-03-24 NOTE — Assessment & Plan Note (Signed)
Hyperlipidemia:Low fat diet discussed and encouraged.   Lipid Panel  Lab Results  Component Value Date   CHOL 218 (H) 03/22/2023   HDL 47 03/22/2023   LDLCALC 148 (H) 03/22/2023   TRIG 128 03/22/2023   CHOLHDL 4.6 (H) 03/22/2023     Uncontroled, start crestor 20 mg daily, stop pravastatin, pt to be  notified

## 2023-03-24 NOTE — Progress Notes (Signed)
Stephanie Singleton     MRN: [id number]      DOB: Feb 14, 1946  Chief Complaint  Patient presents with   Follow-up    Follow up, trouble sleeping    HPI [name] is here for follow up and re-evaluation of chronic medical conditions, medication management and review of any available recent lab and radiology data.  Preventive health is updated, specifically  Cancer screening and Immunization.  Needs and agrees to mammogram, refuses all immunization C/o cold feet C/o difficulty sleeping on average 3 to 4 hours per day, drinks caffeine an has chronic fatigue Grieving process is lessening but persists Denies polyuria, polydipsia, blurred vision , or hypoglycemic episodes. Varies med according to blood sugar, takemed approx 50 % of the time ROS Denies recent fever or chills. Denies sinus pressure, nasal congestion, ear pain or sore throat. Denies chest congestion, productive cough or wheezing. Denies chest pains, palpitations and leg swelling Denies abdominal pain, nausea, vomiting,diarrhea or constipation.   Denies dysuria, frequency, hesitancy or incontinence. Denies uncontrolled joint pain, swelling and limitation in mobility. Denies headaches, seizures, c/o lower extremity numbness,  . Denies skin break down or rash.   PE  BP (!) 145/84 (BP Location: Left Arm, Patient Position: Sitting, Cuff Size: Large)   Pulse 69   Ht 5\' 6"  (1.676 m)   Wt 206 lb 1.3 oz (93.5 kg)   SpO2 97%   BMI 33.26 kg/m   Patient alert and oriented and in no cardiopulmonary distress.  HEENT: No facial asymmetry, EOMI,     Neck supple .  Chest: Clear to auscultation bilaterally.  CVS: S1, S2 no murmurs, no S3.Regular rate.  ABD: Soft non tender.   Ext: No edema  MS: Adequate ROM spine, shoulders, hips and knees.  Skin: Intact, no ulcerations or rash noted.  Psych: Good eye contact, normal affect. Memory intact not anxious or depressed appearing.  CNS: CN 2-12 intact, power,  normal  throughout.no focal deficits noted.   Assessment & Plan Type 2 diabetes with nephropathy (HCC) Controlled, no change in medication DASH diet and commitment to daily physical activity for a minimum of 30 minutes discussed and encouraged, as a part of hypertension management. The importance of attaining a healthy weight is also discussed.     03/22/2023    1:17 PM 03/22/2023    1:15 PM 02/25/2023    1:31 PM 09/14/2022    2:40 PM 09/14/2022    1:40 PM 07/16/2022   10:40 AM 05/29/2022    2:27 PM  BP/Weight  Systolic BP 145 148 140 120 138  160  Diastolic BP 84 85 79 78 80  82  Wt. (Lbs)  206.08 200  204.08 194   BMI  33.26 kg/m2 32.28 kg/m2  32.94 kg/m2 31.31 kg/m2        Obesity (BMI 30.0-34.9)  Patient re-educated about  the importance of commitment to a  minimum of 150 minutes of exercise per week as able.  The importance of healthy food choices with portion control discussed, as well as eating regularly and within a 12 hour window most days. The need to choose "clean , green" food 50 to 75% of the time is discussed, as well as to make water the primary drink and set a goal of 64 ounces water daily.       03/22/2023    1:15 PM 02/25/2023    1:31 PM 09/14/2022    1:40 PM  Weight /BMI  Weight 206 lb 1.3 oz 200  lb 204 lb 1.3 oz  Height 5\' 6"  (1.676 m) 5\' 6"  (1.676 m) 5\' 6"  (1.676 m)  BMI 33.26 kg/m2 32.28 kg/m2 32.94 kg/m2      Hypertension Uncontrolled, resists med imcrease due to poor sleep , and will also work on diet, exercise and low salt diet DASH diet and commitment to daily physical activity for a minimum of 30 minutes discussed and encouraged, as a part of hypertension management. The importance of attaining a healthy weight is also discussed.     03/22/2023    1:17 PM 03/22/2023    1:15 PM 02/25/2023    1:31 PM 09/14/2022    2:40 PM 09/14/2022    1:40 PM 07/16/2022   10:40 AM 05/29/2022    2:27 PM  BP/Weight  Systolic BP 145 148 140 120 138  160  Diastolic BP  84 85 79 78 80  82  Wt. (Lbs)  206.08 200  204.08 194   BMI  33.26 kg/m2 32.28 kg/m2  32.94 kg/m2 31.31 kg/m2        Hyperlipidemia LDL goal <100 Hyperlipidemia:Low fat diet discussed and encouraged.   Lipid Panel  Lab Results  Component Value Date   CHOL 218 (H) 03/22/2023   HDL 47 03/22/2023   LDLCALC 148 (H) 03/22/2023   TRIG 128 03/22/2023   CHOLHDL 4.6 (H) 03/22/2023     Uncontroled, start crestor 20 mg daily, stop pravastatin, pt to be  notified  Dilatation of aorta (HCC) F/u with cardiology past due, will refer  Diastolic heart failure (HCC) Rept echo and eval by cardiollogy past due, will refer  Right kidney mass Rpt [place] of kidneys also because of HTN  Vaccination not carried out because of patient refusal Offered , recommended and again  refuses all immunizations indicated  Insomnia Sleep hygiene reviewed and written information offered also. Prescription sent for  medication needed. Start hydroxyzine and reduce caffeine

## 2023-03-25 ENCOUNTER — Ambulatory Visit: Payer: 59 | Admitting: Physician Assistant

## 2023-03-26 ENCOUNTER — Other Ambulatory Visit: Payer: Self-pay

## 2023-03-26 DIAGNOSIS — I10 Essential (primary) hypertension: Secondary | ICD-10-CM

## 2023-03-26 DIAGNOSIS — E785 Hyperlipidemia, unspecified: Secondary | ICD-10-CM

## 2023-03-26 NOTE — Telephone Encounter (Signed)
Patient aware.

## 2023-03-27 ENCOUNTER — Ambulatory Visit: Payer: 59 | Attending: Cardiology

## 2023-03-27 ENCOUNTER — Ambulatory Visit (HOSPITAL_COMMUNITY): Payer: 59

## 2023-03-27 DIAGNOSIS — I4891 Unspecified atrial fibrillation: Secondary | ICD-10-CM

## 2023-03-27 DIAGNOSIS — Z5181 Encounter for therapeutic drug level monitoring: Secondary | ICD-10-CM

## 2023-03-27 LAB — POCT INR: INR: 4.1 — AB (ref 2.0–3.0)

## 2023-03-27 NOTE — Patient Instructions (Signed)
Description   Do not take any warfarin tomorrow then START taking warfarin 1 tablet daily except 1.5 tablets on Tuesdays.  Recheck INR in 3 weeks.  [facility] [phone]

## 2023-04-03 ENCOUNTER — Ambulatory Visit: Payer: 59

## 2023-04-03 DIAGNOSIS — N281 Cyst of kidney, acquired: Secondary | ICD-10-CM | POA: Diagnosis not present

## 2023-04-03 DIAGNOSIS — N2889 Other specified disorders of kidney and ureter: Secondary | ICD-10-CM

## 2023-04-03 DIAGNOSIS — I10 Essential (primary) hypertension: Secondary | ICD-10-CM

## 2023-04-05 ENCOUNTER — Other Ambulatory Visit: Payer: Self-pay | Admitting: Neurosurgery

## 2023-04-05 DIAGNOSIS — D492 Neoplasm of unspecified behavior of bone, soft tissue, and skin: Secondary | ICD-10-CM

## 2023-04-13 ENCOUNTER — Other Ambulatory Visit: Payer: Self-pay | Admitting: Family Medicine

## 2023-04-17 ENCOUNTER — Ambulatory Visit: Payer: 59 | Attending: Cardiology

## 2023-04-17 DIAGNOSIS — Z5181 Encounter for therapeutic drug level monitoring: Secondary | ICD-10-CM | POA: Diagnosis not present

## 2023-04-17 LAB — POCT INR: INR: 2.9 (ref 2.0–3.0)

## 2023-04-17 NOTE — Patient Instructions (Signed)
Description   Continue taking warfarin 1 tablet daily except 1.5 tablets on Tuesdays.  Recheck INR in 4 weeks.  [facility] [phone]

## 2023-05-14 ENCOUNTER — Encounter: Payer: Self-pay | Admitting: Physician Assistant

## 2023-05-14 ENCOUNTER — Other Ambulatory Visit: Payer: Self-pay | Admitting: *Deleted

## 2023-05-14 ENCOUNTER — Ambulatory Visit (INDEPENDENT_AMBULATORY_CARE_PROVIDER_SITE_OTHER): Payer: 59

## 2023-05-14 ENCOUNTER — Ambulatory Visit: Payer: 59 | Attending: Physician Assistant | Admitting: Physician Assistant

## 2023-05-14 VITALS — BP 124/76 | HR 71 | Ht 66.0 in | Wt 206.2 lb

## 2023-05-14 DIAGNOSIS — I5032 Chronic diastolic (congestive) heart failure: Secondary | ICD-10-CM

## 2023-05-14 DIAGNOSIS — I4891 Unspecified atrial fibrillation: Secondary | ICD-10-CM

## 2023-05-14 DIAGNOSIS — I77810 Thoracic aortic ectasia: Secondary | ICD-10-CM

## 2023-05-14 DIAGNOSIS — E785 Hyperlipidemia, unspecified: Secondary | ICD-10-CM

## 2023-05-14 DIAGNOSIS — I10 Essential (primary) hypertension: Secondary | ICD-10-CM

## 2023-05-14 DIAGNOSIS — R5382 Chronic fatigue, unspecified: Secondary | ICD-10-CM

## 2023-05-14 DIAGNOSIS — Z5181 Encounter for therapeutic drug level monitoring: Secondary | ICD-10-CM

## 2023-05-14 LAB — POCT INR: INR: 3.4 — AB (ref 2.0–3.0)

## 2023-05-14 MED ORDER — PRAVASTATIN SODIUM 20 MG PO TABS: 20.0000 mg | ORAL_TABLET | Freq: Every evening | ORAL | 3 refills | Status: DC

## 2023-05-14 NOTE — Patient Instructions (Signed)
HOLD TOMORROW ONLY THEN Continue taking warfarin 1 tablet daily except 1.5 tablets on Tuesdays.  Recheck INR in 4 weeks.  [facility] [phone]

## 2023-05-14 NOTE — Progress Notes (Signed)
Cardiology Office Note:  .   Date:  05/14/2023  ID:  Stephanie Singleton, DOB December 20, 1945, MRN [id number] PCP: Kerri Perches, MD  [facility] Providers Cardiologist:  Karlea Mckibbin Lerner, DO {  History of Present Illness: .   Stephanie Singleton is a 77 y.o. 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   She previously followed with Dr. Delton See for PE and CHF. LE arterial duplex for leg pain 05/2016 was normal. She has previously been introduced to the idea of switching to DOAC but wished to remain on Coumadin. 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 I met her in clinic in 2018 at which time she was reporting chronic fatigue x 15 years and gradual DOE in the setting of weight gain. Of note, she has 13 children which includes 3 sets of twins. 2D Echo 01/08/17 showed EF 60-65%, grade 1 DD, mildly dilated ascending aorta, no significant pulm HTN, normal CVP. Nuclear stress test 12/2016 was normal. She declined to see pulmonology. Repeat sleep study 2018 showed no significant OSA on home study; in-lab study was recommended but patient declined to proceed. She has declined to have repeat echo for her dilated aorta. At OV 05/2020 her HCTZ was changed to chlorthalidone due to uncontrolled HTN. She declined to do so and did not wish to go back to HCTZ either. She previously self-discontinued amlodipine due to lower extremity edema. She planned to f/u with primary care for BP instead. She was seen at an outside ER 10/2020 with elevated BP and chest tightness. Troponins were negative. Admission was recommended for hypertensive crisis but she signed out AMA.   She was seen 07/20/21 by Dr. Shari Prows.  At that time she was doing well from a cardiac standpoint.  Unchanged chronic daily  pelvic activity she was taking torsemide 1-3  times a week. .  She denies chest pain or syncope.  Caring for her 70 year old husband who has been in poor health the last few months.  This has been contributing to her fatigue and increased level of stress.  She does have some family helping her out.  Today, she presents with a history of heart disease and varicose veins,  with concerns about an aortic aneurysm that was diagnosed several years ago. She was under the impression that the aneurysm had resolved, but recent conversations with another doctor revealed that it is still present, albeit mildly enlarged. The patient reports feeling generally well, with no new or worsening symptoms. She has been managing her heart disease with a regimen of torasemide, potassium, Benicar, Crestor, carvedilol, and Coumadin, which she reports taking as prescribed. The patient has been on Coumadin for 20 years following a pulmonary embolism and prefers it over newer anticoagulants due to its predictable effects and the ability to monitor its efficacy.  The patient also reports varicose veins in her left leg, which she describes as either feeling very cold or very hot. She has been told this could be due to neuropathy, but is unsure. She expresses a desire to know if the varicose veins could cause any harm or if there is anything that can be done to treat them.  Reports no shortness of breath nor dyspnea on exertion. Reports no chest pain, pressure, or tightness. No edema, orthopnea, PND. Reports no palpitations.  Discussed the use of AI scribe software for clinical note transcription with the patient, who gave verbal consent to proceed.  ROS: Pertinent ROS in HPI  Studies Reviewed: Marland Kitchen   EKG Interpretation Date/Time:  Tuesday May 14 2023 13:44:53 EST Ventricular Rate:  71 PR Interval:  164 QRS Duration:  94 QT Interval:  410 QTC Calculation: 445 R Axis:   6  Text Interpretation: Normal sinus rhythm Minimal  voltage criteria for LVH, may be normal variant ( R in aVL ) Nonspecific ST and T wave abnormality When compared with ECG of 20-Dec-2020 15:02, Nonspecific T wave abnormality has replaced inverted T waves in Anterolateral leads Confirmed by Jari Favre [phone]) on 05/14/2023 2:46:16 PM    Recent echo reviewed 2018      Physical Exam:   VS:  BP 124/76   Pulse 71   Ht 5\' 6"  (1.676 m)   Wt 206 lb 3.2 oz (93.5 kg)   SpO2 98%   BMI 33.28 kg/m    Wt Readings from Last 3 Encounters:  05/14/23 206 lb 3.2 oz (93.5 kg)  03/22/23 206 lb 1.3 oz (93.5 kg)  02/25/23 200 lb (90.7 kg)    GEN: Well nourished, well developed in no acute distress NECK: No JVD; No carotid bruits CARDIAC: RRR, no murmurs, rubs, gallops RESPIRATORY:  Clear to auscultation without rales, wheezing or rhonchi  ABDOMEN: Soft, non-tender, non-distended EXTREMITIES:  No edema; No deformity   ASSESSMENT AND PLAN: .     Ascending Aortic Aneurysm Mildly enlarged aorta (39mm in 2018). No recent imaging. No symptoms of dissection. -Order echocardiogram to reassess size of aortic aneurysm and overall cardiac function.  Hyperlipidemia LDL elevated at 148 in September. Patient was not taking pravastatin as prescribed. Patient reports muscle aches on rosuvastatin. -Discontinue rosuvastatin. -Resume pravastatin 20mg  at night. -Recheck lipid panel in 8 weeks.  Varicose Veins Patient reports cold sensation in leg with varicose veins. No claudication symptoms. -Advise patient on use of compression stockings. -Advise patient to report any new symptoms such as pain with walking.  Anticoagulation for History of Pulmonary Embolism Patient has been on Coumadin for 20 years without any bleeding issues. Patient prefers Coumadin over newer anticoagulants. -Continue Coumadin 2.5mg  daily.  Chronic Diastolic CHF Patient takes torasemide 1-3 times per week based on fluid status. No recent increase in diuretic use. -Continue current  regimen of torasemide as needed.  Hypertension Blood pressure well controlled on current regimen. -Continue Benicar and carvedilol as prescribed.       Dispo: She can follow-up in a year with Dr. Odis Hollingshead  Signed, Sharlene Dory, PA-C

## 2023-05-14 NOTE — Patient Instructions (Signed)
Medication Instructions:  Your physician has recommended you make the following change in your medication:   STOP Rosuvastatin   START Pravastatin 20 mg taking 1 daily   *If you need a refill on your cardiac medications before your next appointment, please call your pharmacy*   Lab Work: 8 WEEKS, GO TO A NEAR [facility], FASTING, FOR :  LIPID & LFT  If you have labs (blood work) drawn today and your tests are completely normal, you will receive your results only by: MyChart Message (if you have MyChart) OR A paper copy in the mail If you have any lab test that is abnormal or we need to change your treatment, we will call you to review the results.   Testing/Procedures: Your physician has requested that you have an echocardiogram. Echocardiography is a painless test that uses sound waves to create images of your heart. It provides your doctor with information about the size and shape of your heart and how well your heart's chambers and valves are working. This procedure takes approximately one hour. There are no restrictions for this procedure. Please do NOT wear cologne, perfume, aftershave, or lotions (deodorant is allowed). Please arrive 15 minutes prior to your appointment time.  Please note: We ask at that you not bring children with you during ultrasound (echo/ vascular) testing. Due to room size and safety concerns, children are not allowed in the ultrasound rooms during exams. Our front office staff cannot provide observation of children in our lobby area while testing is being conducted. An adult accompanying a patient to their appointment will only be allowed in the ultrasound room at the discretion of the ultrasound technician under special circumstances. We apologize for any inconvenience.    Follow-Up: At [facility], you and your health needs are our priority.  As part of our continuing mission to provide you with exceptional heart care, we have created designated  Provider Care Teams.  These Care Teams include your primary Cardiologist (physician) and Advanced Practice Providers (APPs -  Physician Assistants and Nurse Practitioners) who all work together to provide you with the care you need, when you need it.  We recommend signing up for the patient portal called "MyChart".  Sign up information is provided on this After Visit Summary.  MyChart is used to connect with patients for Virtual Visits (Telemedicine).  Patients are able to view lab/test results, encounter notes, upcoming appointments, etc.  Non-urgent messages can be sent to your provider as well.   To learn more about what you can do with MyChart, go to [email or web address].    Your next appointment:   12 month(s)  Provider:   Tessa Lerner, DO     Other Instructions

## 2023-05-18 ENCOUNTER — Other Ambulatory Visit: Payer: Self-pay | Admitting: Family Medicine

## 2023-05-23 ENCOUNTER — Ambulatory Visit
Admission: RE | Admit: 2023-05-23 | Discharge: 2023-05-23 | Disposition: A | Discharge status: Home / Self Care | Payer: 59 | Source: Ambulatory Visit | Attending: Neurosurgery | Admitting: Neurosurgery

## 2023-05-23 ENCOUNTER — Other Ambulatory Visit: Payer: Self-pay | Admitting: Neurosurgery

## 2023-05-23 DIAGNOSIS — D492 Neoplasm of unspecified behavior of bone, soft tissue, and skin: Secondary | ICD-10-CM

## 2023-05-23 DIAGNOSIS — M4802 Spinal stenosis, cervical region: Secondary | ICD-10-CM | POA: Diagnosis not present

## 2023-05-23 DIAGNOSIS — Z981 Arthrodesis status: Secondary | ICD-10-CM | POA: Diagnosis not present

## 2023-05-31 ENCOUNTER — Other Ambulatory Visit: Payer: Self-pay | Admitting: Family Medicine

## 2023-05-31 DIAGNOSIS — J301 Allergic rhinitis due to pollen: Secondary | ICD-10-CM

## 2023-06-11 ENCOUNTER — Ambulatory Visit: Payer: 59 | Attending: Cardiovascular Disease

## 2023-06-11 DIAGNOSIS — Z5181 Encounter for therapeutic drug level monitoring: Secondary | ICD-10-CM

## 2023-06-11 DIAGNOSIS — I272 Pulmonary hypertension, unspecified: Secondary | ICD-10-CM | POA: Diagnosis not present

## 2023-06-11 LAB — POCT INR: INR: 2.4 (ref 2.0–3.0)

## 2023-06-11 NOTE — Patient Instructions (Signed)
Continue taking warfarin 1 tablet daily except 1.5 tablets on Tuesdays.  Recheck INR in 6 weeks.  [facility] [phone]

## 2023-06-19 ENCOUNTER — Other Ambulatory Visit: Payer: Self-pay | Admitting: Family Medicine

## 2023-07-09 ENCOUNTER — Other Ambulatory Visit: Payer: Self-pay | Admitting: Family Medicine

## 2023-07-10 ENCOUNTER — Ambulatory Visit: Payer: 59 | Admitting: Physician Assistant

## 2023-07-23 ENCOUNTER — Ambulatory Visit: Payer: 59 | Admitting: Family Medicine

## 2023-07-24 ENCOUNTER — Ambulatory Visit: Payer: 59 | Attending: Adult Health | Admitting: *Deleted

## 2023-07-24 DIAGNOSIS — I272 Pulmonary hypertension, unspecified: Secondary | ICD-10-CM | POA: Diagnosis not present

## 2023-07-24 DIAGNOSIS — Z7901 Long term (current) use of anticoagulants: Secondary | ICD-10-CM | POA: Diagnosis not present

## 2023-07-24 DIAGNOSIS — Z5181 Encounter for therapeutic drug level monitoring: Secondary | ICD-10-CM | POA: Diagnosis not present

## 2023-07-24 LAB — POCT INR: INR: 1.8 — AB (ref 2.0–3.0)

## 2023-07-24 NOTE — Patient Instructions (Signed)
Description   Today take 1.5 tablets of warfarin then continue taking warfarin 1 tablet daily except 1.5 tablets on Tuesdays.  Recheck INR in 5 weeks.  [facility]madin Clinic [phone]

## 2023-07-26 DIAGNOSIS — G4730 Sleep apnea, unspecified: Secondary | ICD-10-CM | POA: Diagnosis not present

## 2023-07-26 DIAGNOSIS — E785 Hyperlipidemia, unspecified: Secondary | ICD-10-CM | POA: Diagnosis not present

## 2023-07-26 DIAGNOSIS — I10 Essential (primary) hypertension: Secondary | ICD-10-CM | POA: Diagnosis not present

## 2023-07-26 DIAGNOSIS — K219 Gastro-esophageal reflux disease without esophagitis: Secondary | ICD-10-CM | POA: Diagnosis not present

## 2023-07-26 DIAGNOSIS — E119 Type 2 diabetes mellitus without complications: Secondary | ICD-10-CM | POA: Diagnosis not present

## 2023-07-26 DIAGNOSIS — I2699 Other pulmonary embolism without acute cor pulmonale: Secondary | ICD-10-CM | POA: Diagnosis not present

## 2023-07-26 DIAGNOSIS — J309 Allergic rhinitis, unspecified: Secondary | ICD-10-CM | POA: Diagnosis not present

## 2023-07-26 DIAGNOSIS — E559 Vitamin D deficiency, unspecified: Secondary | ICD-10-CM | POA: Diagnosis not present

## 2023-07-26 DIAGNOSIS — G4700 Insomnia, unspecified: Secondary | ICD-10-CM | POA: Diagnosis not present

## 2023-07-26 DIAGNOSIS — K769 Liver disease, unspecified: Secondary | ICD-10-CM | POA: Diagnosis not present

## 2023-07-26 DIAGNOSIS — I77819 Aortic ectasia, unspecified site: Secondary | ICD-10-CM | POA: Diagnosis not present

## 2023-07-26 DIAGNOSIS — I5030 Unspecified diastolic (congestive) heart failure: Secondary | ICD-10-CM | POA: Diagnosis not present

## 2023-07-29 ENCOUNTER — Ambulatory Visit (HOSPITAL_COMMUNITY): Payer: 59 | Attending: Cardiology

## 2023-07-29 DIAGNOSIS — E785 Hyperlipidemia, unspecified: Secondary | ICD-10-CM | POA: Diagnosis not present

## 2023-07-29 DIAGNOSIS — R5382 Chronic fatigue, unspecified: Secondary | ICD-10-CM | POA: Insufficient documentation

## 2023-07-29 DIAGNOSIS — I5032 Chronic diastolic (congestive) heart failure: Secondary | ICD-10-CM | POA: Diagnosis not present

## 2023-07-29 DIAGNOSIS — I10 Essential (primary) hypertension: Secondary | ICD-10-CM | POA: Diagnosis not present

## 2023-07-29 DIAGNOSIS — I77810 Thoracic aortic ectasia: Secondary | ICD-10-CM | POA: Insufficient documentation

## 2023-07-29 LAB — ECHOCARDIOGRAM COMPLETE
Area-P 1/2: 3.13 cm2
S' Lateral: 2.8 cm

## 2023-07-30 ENCOUNTER — Encounter: Payer: Self-pay | Admitting: Physician Assistant

## 2023-07-30 NOTE — Telephone Encounter (Signed)
Spoke with Pt. Results given. Pt stated understanding.

## 2023-07-30 NOTE — Telephone Encounter (Signed)
Patient returning you call to a nurse

## 2023-08-09 ENCOUNTER — Ambulatory Visit: Payer: Self-pay | Admitting: Family Medicine

## 2023-08-09 ENCOUNTER — Ambulatory Visit: Payer: 59 | Admitting: Family Medicine

## 2023-08-09 ENCOUNTER — Telehealth: Payer: Self-pay | Admitting: Cardiology

## 2023-08-09 NOTE — Telephone Encounter (Signed)
 Pt states that she has been coughing x 3-4 days and wheezing just started at night that has worsened. She said we had given her something for the wheezing in the past. No other symptoms. Requesting cb

## 2023-08-09 NOTE — Telephone Encounter (Signed)
 Copied from CRM [id number]. Topic: Clinical - Red Word Triage >> Aug 09, 2023 10:35 AM Curlee DEL wrote: [facility] that prompted transfer to Nurse Triage: Patient has developed a cough and at night, she states that she often experiencing wheezing.   Chief Complaint: Cough Symptoms: Cough, wheezing at night Frequency: Frequent cough Pertinent Negatives: Patient denies fever, shortness of breath  Disposition: [] ED /[] Urgent Care (no appt availability in office) / [x] Appointment(In office/virtual)/ []  [facility]/ [] Home Care/ [] Refused Recommended Disposition /[] [facility]/ []  Follow-up with PCP Additional Notes: Patient reports she began to experience a cough last night. She states with her cough she had some wheezing at night but the wheezing is improved now. She denies any fever or shortness of breath. Patient states she has similar symptoms every year. Virtual appointment made at an available office today.     Reason for Disposition  Wheezing is present  Answer Assessment - Initial Assessment Questions 1. ONSET: When did the cough begin?      Yesterday  2. SEVERITY: How bad is the cough today?      Frequent  3. SPUTUM: Describe the color of your sputum (none, dry cough; clear, white, yellow, green)     Yellow 4. HEMOPTYSIS: Are you coughing up any blood? If so ask: How much? (flecks, streaks, tablespoons, etc.)     No 5. DIFFICULTY BREATHING: Are you having difficulty breathing? If Yes, ask: How bad is it? (e.g., mild, moderate, severe)    - MILD: No SOB at rest, mild SOB with walking, speaks normally in sentences, can lie down, no retractions, pulse < 100.    - MODERATE: SOB at rest, SOB with minimal exertion and prefers to sit, cannot lie down flat, speaks in phrases, mild retractions, audible wheezing, pulse 100-120.    - SEVERE: Very SOB at rest, speaks in single words, struggling to breathe, sitting hunched forward, retractions, pulse > 120       No 6. FEVER: Do you have a fever? If Yes, ask: What is your temperature, how was it measured, and when did it start?     No 7. CARDIAC HISTORY: Do you have any history of heart disease? (e.g., heart attack, congestive heart failure)      No 8. LUNG HISTORY: Do you have any history of lung disease?  (e.g., pulmonary embolus, asthma, emphysema)     No 9. PE RISK FACTORS: Do you have a history of blood clots? (or: recent major surgery, recent prolonged travel, bedridden)     Yes, many years ago, taking coumadin  10. OTHER SYMPTOMS: Do you have any other symptoms? (e.g., runny nose, wheezing, chest pain)       Wheezing which is worse at night  11. PREGNANCY: Is there any chance you are pregnant? When was your last menstrual period?       No 12. TRAVEL: Have you traveled out of the country in the last month? (e.g., travel history, exposures)       No  Protocols used: Cough - Acute Productive-A-AH

## 2023-08-09 NOTE — Telephone Encounter (Signed)
 noted

## 2023-08-13 NOTE — Telephone Encounter (Signed)
Spoke with pt over the phone and advised for her to call PCP to be evaluated and see if they need to prescribe medication. Pt verbalized understanding and had no further questions.

## 2023-08-28 ENCOUNTER — Ambulatory Visit: Payer: 59 | Attending: Cardiovascular Disease

## 2023-08-28 DIAGNOSIS — I4891 Unspecified atrial fibrillation: Secondary | ICD-10-CM

## 2023-08-28 DIAGNOSIS — Z5181 Encounter for therapeutic drug level monitoring: Secondary | ICD-10-CM

## 2023-08-28 LAB — POCT INR: INR: 2.1 (ref 2.0–3.0)

## 2023-08-28 NOTE — Patient Instructions (Signed)
 Description   Continue taking warfarin 1 tablet daily except 1.5 tablets on Tuesdays.  Recheck INR in 6 weeks.  [facility]in Clinic [phone]

## 2023-08-30 DIAGNOSIS — E785 Hyperlipidemia, unspecified: Secondary | ICD-10-CM | POA: Diagnosis not present

## 2023-08-30 DIAGNOSIS — E119 Type 2 diabetes mellitus without complications: Secondary | ICD-10-CM | POA: Diagnosis not present

## 2023-08-30 DIAGNOSIS — G4730 Sleep apnea, unspecified: Secondary | ICD-10-CM | POA: Diagnosis not present

## 2023-08-30 DIAGNOSIS — R053 Chronic cough: Secondary | ICD-10-CM | POA: Diagnosis not present

## 2023-08-30 DIAGNOSIS — Z7984 Long term (current) use of oral hypoglycemic drugs: Secondary | ICD-10-CM | POA: Diagnosis not present

## 2023-08-30 DIAGNOSIS — Z79899 Other long term (current) drug therapy: Secondary | ICD-10-CM | POA: Diagnosis not present

## 2023-08-30 DIAGNOSIS — Z0189 Encounter for other specified special examinations: Secondary | ICD-10-CM | POA: Diagnosis not present

## 2023-08-30 DIAGNOSIS — E559 Vitamin D deficiency, unspecified: Secondary | ICD-10-CM | POA: Diagnosis not present

## 2023-08-30 DIAGNOSIS — Z0001 Encounter for general adult medical examination with abnormal findings: Secondary | ICD-10-CM | POA: Diagnosis not present

## 2023-08-30 DIAGNOSIS — E1140 Type 2 diabetes mellitus with diabetic neuropathy, unspecified: Secondary | ICD-10-CM | POA: Diagnosis not present

## 2023-09-03 ENCOUNTER — Ambulatory Visit: Payer: Self-pay | Admitting: Family Medicine

## 2023-09-15 ENCOUNTER — Other Ambulatory Visit: Payer: Self-pay | Admitting: Family Medicine

## 2023-09-16 ENCOUNTER — Other Ambulatory Visit: Payer: Self-pay

## 2023-09-16 MED ORDER — WARFARIN SODIUM 2.5 MG PO TABS: ORAL_TABLET | ORAL | 0 refills | Status: DC

## 2023-10-09 ENCOUNTER — Ambulatory Visit: Payer: 59 | Attending: Cardiovascular Disease

## 2023-10-09 DIAGNOSIS — I4891 Unspecified atrial fibrillation: Secondary | ICD-10-CM

## 2023-10-09 DIAGNOSIS — Z5181 Encounter for therapeutic drug level monitoring: Secondary | ICD-10-CM | POA: Diagnosis not present

## 2023-10-09 LAB — POCT INR: INR: 3.8 — AB (ref 2.0–3.0)

## 2023-10-09 NOTE — Patient Instructions (Addendum)
 Description   HOLD tomorrow's dose and then continue taking warfarin 1 tablet daily except 1.5 tablets on Tuesdays.  Recheck INR in 4 weeks.  [facility]in Clinic [phone]

## 2023-11-06 ENCOUNTER — Ambulatory Visit

## 2023-11-12 ENCOUNTER — Ambulatory Visit: Attending: Cardiovascular Disease | Admitting: *Deleted

## 2023-11-12 DIAGNOSIS — Z5181 Encounter for therapeutic drug level monitoring: Secondary | ICD-10-CM

## 2023-11-12 DIAGNOSIS — I4891 Unspecified atrial fibrillation: Secondary | ICD-10-CM

## 2023-11-12 LAB — POCT INR: INR: 2.7 (ref 2.0–3.0)

## 2023-11-12 NOTE — Patient Instructions (Signed)
 Description   Continue taking warfarin 1 tablet daily except 1.5 tablets on Tuesdays.  Recheck INR in 5 weeks.  Coumadin  Clinic [phone]

## 2023-12-05 ENCOUNTER — Ambulatory Visit

## 2023-12-06 ENCOUNTER — Ambulatory Visit
Admission: RE | Admit: 2023-12-06 | Discharge: 2023-12-06 | Disposition: A | Discharge status: Home / Self Care | Source: Ambulatory Visit | Attending: Family Medicine | Admitting: Family Medicine

## 2023-12-06 ENCOUNTER — Other Ambulatory Visit: Payer: Self-pay | Admitting: Geriatric Medicine

## 2023-12-06 DIAGNOSIS — E559 Vitamin D deficiency, unspecified: Secondary | ICD-10-CM

## 2023-12-06 DIAGNOSIS — E66811 Obesity, class 1: Secondary | ICD-10-CM

## 2023-12-06 DIAGNOSIS — Z1231 Encounter for screening mammogram for malignant neoplasm of breast: Secondary | ICD-10-CM

## 2023-12-06 DIAGNOSIS — E785 Hyperlipidemia, unspecified: Secondary | ICD-10-CM

## 2023-12-06 DIAGNOSIS — K8010 Calculus of gallbladder with chronic cholecystitis without obstruction: Secondary | ICD-10-CM

## 2023-12-06 DIAGNOSIS — N2889 Other specified disorders of kidney and ureter: Secondary | ICD-10-CM

## 2023-12-06 DIAGNOSIS — Z2821 Immunization not carried out because of patient refusal: Secondary | ICD-10-CM

## 2023-12-06 DIAGNOSIS — F5104 Psychophysiologic insomnia: Secondary | ICD-10-CM

## 2023-12-06 DIAGNOSIS — I5032 Chronic diastolic (congestive) heart failure: Secondary | ICD-10-CM

## 2023-12-06 DIAGNOSIS — I77819 Aortic ectasia, unspecified site: Secondary | ICD-10-CM

## 2023-12-06 DIAGNOSIS — I10 Essential (primary) hypertension: Secondary | ICD-10-CM

## 2023-12-06 DIAGNOSIS — E1121 Type 2 diabetes mellitus with diabetic nephropathy: Secondary | ICD-10-CM

## 2023-12-13 ENCOUNTER — Other Ambulatory Visit: Payer: Self-pay | Admitting: Family Medicine

## 2023-12-14 ENCOUNTER — Other Ambulatory Visit: Payer: Self-pay | Admitting: Cardiology

## 2023-12-17 ENCOUNTER — Ambulatory Visit: Attending: Cardiology

## 2023-12-17 DIAGNOSIS — Z5181 Encounter for therapeutic drug level monitoring: Secondary | ICD-10-CM

## 2023-12-17 DIAGNOSIS — I4891 Unspecified atrial fibrillation: Secondary | ICD-10-CM | POA: Diagnosis not present

## 2023-12-17 LAB — POCT INR: INR: 2.4 (ref 2.0–3.0)

## 2023-12-17 NOTE — Patient Instructions (Signed)
 Continue taking warfarin 1 tablet daily except 1.5 tablets on Tuesdays.  Recheck INR in 7 weeks.  Coumadin  Clinic [phone]

## 2024-01-26 ENCOUNTER — Other Ambulatory Visit: Payer: Self-pay | Admitting: Family Medicine

## 2024-02-04 ENCOUNTER — Ambulatory Visit: Attending: Adult Health | Admitting: *Deleted

## 2024-02-04 DIAGNOSIS — I272 Pulmonary hypertension, unspecified: Secondary | ICD-10-CM | POA: Diagnosis not present

## 2024-02-04 LAB — POCT INR: INR: 2.3 (ref 2.0–3.0)

## 2024-02-04 NOTE — Patient Instructions (Signed)
 Description   Continue taking warfarin 1 tablet daily except 1.5 tablets on Tuesdays.  Recheck INR in 7 weeks.  Coumadin  Clinic [phone]

## 2024-02-04 NOTE — Progress Notes (Signed)
 INR 2.3. Please see anticoagulation encounter

## 2024-03-24 ENCOUNTER — Ambulatory Visit: Attending: Cardiology | Admitting: *Deleted

## 2024-03-24 DIAGNOSIS — I4891 Unspecified atrial fibrillation: Secondary | ICD-10-CM | POA: Diagnosis not present

## 2024-03-24 DIAGNOSIS — Z5181 Encounter for therapeutic drug level monitoring: Secondary | ICD-10-CM

## 2024-03-24 LAB — POCT INR: INR: 2.1 (ref 2.0–3.0)

## 2024-03-24 NOTE — Patient Instructions (Signed)
 Description   INR-2.1; Continue taking warfarin 1 tablet daily except 1.5 tablets on Tuesdays.  Recheck INR in 8 weeks.  Coumadin  Clinic [phone]

## 2024-03-24 NOTE — Progress Notes (Signed)
 Description   INR-2.1; Continue taking warfarin 1 tablet daily except 1.5 tablets on Tuesdays.  Recheck INR in 8 weeks.  Coumadin  Clinic [phone]

## 2024-03-25 ENCOUNTER — Other Ambulatory Visit: Payer: Self-pay | Admitting: *Deleted

## 2024-03-25 DIAGNOSIS — Z7901 Long term (current) use of anticoagulants: Secondary | ICD-10-CM

## 2024-03-25 DIAGNOSIS — I2699 Other pulmonary embolism without acute cor pulmonale: Secondary | ICD-10-CM

## 2024-03-25 MED ORDER — WARFARIN SODIUM 2.5 MG PO TABS: ORAL_TABLET | ORAL | 1 refills | Status: DC

## 2024-03-25 NOTE — Telephone Encounter (Signed)
 Warfarin 2.5mg  refill PE Last INR 03/25/24 Last OV  05/14/23

## 2024-03-26 ENCOUNTER — Other Ambulatory Visit: Payer: Self-pay | Admitting: Family Medicine

## 2024-03-30 ENCOUNTER — Other Ambulatory Visit: Payer: Self-pay | Admitting: Pharmacist

## 2024-03-30 DIAGNOSIS — I2699 Other pulmonary embolism without acute cor pulmonale: Secondary | ICD-10-CM

## 2024-03-30 DIAGNOSIS — Z7901 Long term (current) use of anticoagulants: Secondary | ICD-10-CM

## 2024-03-30 MED ORDER — WARFARIN SODIUM 2.5 MG PO TABS: ORAL_TABLET | ORAL | 1 refills | Status: DC

## 2024-04-17 ENCOUNTER — Other Ambulatory Visit: Payer: Self-pay | Admitting: Cardiology

## 2024-04-17 DIAGNOSIS — I2699 Other pulmonary embolism without acute cor pulmonale: Secondary | ICD-10-CM

## 2024-04-17 DIAGNOSIS — Z7901 Long term (current) use of anticoagulants: Secondary | ICD-10-CM

## 2024-05-05 ENCOUNTER — Other Ambulatory Visit: Payer: Self-pay | Admitting: Geriatric Medicine

## 2024-05-05 DIAGNOSIS — N644 Mastodynia: Secondary | ICD-10-CM

## 2024-05-19 ENCOUNTER — Ambulatory Visit: Attending: Internal Medicine | Admitting: *Deleted

## 2024-05-19 DIAGNOSIS — I4891 Unspecified atrial fibrillation: Secondary | ICD-10-CM

## 2024-05-19 DIAGNOSIS — Z5181 Encounter for therapeutic drug level monitoring: Secondary | ICD-10-CM

## 2024-05-19 LAB — POCT INR: INR: 4.4 — AB (ref 2.0–3.0)

## 2024-05-19 NOTE — Patient Instructions (Signed)
 Description   INR-4.4; Do not take any warfarin tomorrow (already taken today's dose) and no warfarin Thursday then continue taking warfarin 1 tablet daily except 1.5 tablets on Tuesdays.  Recheck INR in 3 weeks (normally 8 weeks). Coumadin  Clinic [phone]

## 2024-05-19 NOTE — Progress Notes (Signed)
 Description   INR-4.4; Do not take any warfarin tomorrow (already taken today's dose) and no warfarin Thursday then continue taking warfarin 1 tablet daily except 1.5 tablets on Tuesdays.  Recheck INR in 3 weeks (normally 8 weeks). Coumadin  Clinic [phone]

## 2024-05-26 ENCOUNTER — Other Ambulatory Visit

## 2024-05-26 ENCOUNTER — Encounter

## 2024-06-03 ENCOUNTER — Other Ambulatory Visit: Payer: Self-pay

## 2024-06-04 MED ORDER — PRAVASTATIN SODIUM 20 MG PO TABS: 20.0000 mg | ORAL_TABLET | Freq: Every evening | ORAL | 0 refills | Status: DC

## 2024-06-09 ENCOUNTER — Ambulatory Visit

## 2024-06-09 ENCOUNTER — Telehealth: Payer: Self-pay | Admitting: *Deleted

## 2024-06-09 NOTE — Telephone Encounter (Signed)
 Patient called to cancel today's appointment due to being sick/coughing. She states she is also on lyrica  and an inhaler advised this is safe to take with warfarin.

## 2024-06-11 ENCOUNTER — Other Ambulatory Visit: Payer: Self-pay | Admitting: Family Medicine

## 2024-06-12 ENCOUNTER — Ambulatory Visit

## 2024-06-12 ENCOUNTER — Ambulatory Visit
Admission: RE | Admit: 2024-06-12 | Discharge: 2024-06-12 | Disposition: A | Payer: Self-pay | Source: Ambulatory Visit | Attending: Geriatric Medicine | Admitting: Geriatric Medicine

## 2024-06-12 DIAGNOSIS — N644 Mastodynia: Secondary | ICD-10-CM

## 2024-06-15 ENCOUNTER — Ambulatory Visit: Attending: Cardiology

## 2024-06-15 DIAGNOSIS — Z5181 Encounter for therapeutic drug level monitoring: Secondary | ICD-10-CM

## 2024-06-15 DIAGNOSIS — I4891 Unspecified atrial fibrillation: Secondary | ICD-10-CM

## 2024-06-15 LAB — POCT INR: INR: 2 (ref 2.0–3.0)

## 2024-06-15 NOTE — Patient Instructions (Signed)
 continue taking warfarin 1 tablet daily except 1.5 tablets on Tuesdays.  Recheck INR in 8 weeks (normally 8 weeks). Coumadin  Clinic [phone]

## 2024-06-15 NOTE — Progress Notes (Signed)
 INR 2.0 Please see anticoagulation encounter  continue taking warfarin 1 tablet daily except 1.5 tablets on Tuesdays.  Recheck INR in 8 weeks (normally 8 weeks). Coumadin  Clinic [phone]

## 2024-07-01 ENCOUNTER — Other Ambulatory Visit: Payer: Self-pay | Admitting: Physician Assistant

## 2024-07-27 ENCOUNTER — Other Ambulatory Visit: Payer: Self-pay | Admitting: Physician Assistant

## 2024-08-11 ENCOUNTER — Ambulatory Visit
